# Patient Record
Sex: Female | Born: 1955
Health system: Southern US, Community
[De-identification: ages and names within clinical notes are randomized; demographics above are authoritative.]

## PROBLEM LIST (undated history)

## (undated) DIAGNOSIS — M109 Gout, unspecified: Secondary | ICD-10-CM

## (undated) DIAGNOSIS — E079 Disorder of thyroid, unspecified: Secondary | ICD-10-CM

## (undated) DIAGNOSIS — I4819 Other persistent atrial fibrillation: Secondary | ICD-10-CM

## (undated) DIAGNOSIS — M549 Dorsalgia, unspecified: Secondary | ICD-10-CM

## (undated) DIAGNOSIS — R55 Syncope and collapse: Secondary | ICD-10-CM

## (undated) DIAGNOSIS — I2699 Other pulmonary embolism without acute cor pulmonale: Secondary | ICD-10-CM

## (undated) DIAGNOSIS — R011 Cardiac murmur, unspecified: Secondary | ICD-10-CM

---

## 2014-11-15 ENCOUNTER — Encounter (HOSPITAL_COMMUNITY): Payer: Self-pay | Admitting: Neurology

## 2014-11-15 ENCOUNTER — Emergency Department (HOSPITAL_COMMUNITY)
Admission: EM | Admit: 2014-11-15 | Discharge: 2014-11-15 | Disposition: A | Discharge status: Home / Self Care | Payer: No Typology Code available for payment source | Attending: Emergency Medicine | Admitting: Emergency Medicine

## 2014-11-15 DIAGNOSIS — Y998 Other external cause status: Secondary | ICD-10-CM | POA: Insufficient documentation

## 2014-11-15 DIAGNOSIS — Y92480 Sidewalk as the place of occurrence of the external cause: Secondary | ICD-10-CM | POA: Diagnosis not present

## 2014-11-15 DIAGNOSIS — S0101XA Laceration without foreign body of scalp, initial encounter: Secondary | ICD-10-CM | POA: Diagnosis not present

## 2014-11-15 DIAGNOSIS — W01198A Fall on same level from slipping, tripping and stumbling with subsequent striking against other object, initial encounter: Secondary | ICD-10-CM | POA: Diagnosis not present

## 2014-11-15 DIAGNOSIS — Z86711 Personal history of pulmonary embolism: Secondary | ICD-10-CM | POA: Insufficient documentation

## 2014-11-15 DIAGNOSIS — Y9389 Activity, other specified: Secondary | ICD-10-CM | POA: Diagnosis not present

## 2014-11-15 DIAGNOSIS — W19XXXA Unspecified fall, initial encounter: Secondary | ICD-10-CM

## 2014-11-15 HISTORY — DX: Dorsalgia, unspecified: M54.9

## 2014-11-15 HISTORY — DX: Other pulmonary embolism without acute cor pulmonale: I26.99

## 2014-11-15 MED ORDER — LIDOCAINE-EPINEPHRINE 1 %-1:100000 IJ SOLN
20.0000 mL | Freq: Once | INTRAMUSCULAR | Status: AC
  Administered 2014-11-15: 20 mL via INTRADERMAL
  Filled 2014-11-15: qty 1

## 2014-11-15 NOTE — Discharge Instructions (Signed)
Staple Care and Removal Your caregiver has used staples today to repair your wound. Staples are used to help a wound heal faster by holding the edges of the wound together. The staples can be removed when the wound has healed well enough to stay together after the staples are removed. A dressing (wound covering), depending on the location of the wound, may have been applied. This may be changed once per day or as instructed. If the dressing sticks, it may be soaked off with soapy water or hydrogen peroxide. Only take over-the-counter or prescription medicines for pain, discomfort, or fever as directed by your caregiver.  If you did not receive a tetanus shot today because you did not recall when your last one was given, check with your caregiver when you have your staples removed to determine if one is needed. Return to your caregiver's office in 1 week or as suggested to have your staples removed. SEEK IMMEDIATE MEDICAL CARE IF:   You have redness, swelling, or increasing pain in the wound.  You have pus coming from the wound.  You have a fever.  You notice a bad smell coming from the wound or dressing.  Your wound edges break open after staples have been removed. Document Released: 12/20/2000 Document Revised: 06/19/2011 Document Reviewed: 01/04/2005 [facility] Patient Information 2015 [place], [place]. This information is not intended to replace advice given to you by your health care provider. Make sure you discuss any questions you have with your health care provider. Laceration Care, Adult A laceration is a cut or lesion that goes through all layers of the skin and into the tissue just beneath the skin. TREATMENT  Some lacerations may not require closure. Some lacerations may not be able to be closed due to an increased risk of infection. It is important to see your caregiver as soon as possible after an injury to minimize the risk of infection and maximize the opportunity for successful  closure. If closure is appropriate, pain medicines may be given, if needed. The wound will be cleaned to help prevent infection. Your caregiver will use stitches (sutures), staples, wound glue (adhesive), or skin adhesive strips to repair the laceration. These tools bring the skin edges together to allow for faster healing and a better cosmetic outcome. However, all wounds will heal with a scar. Once the wound has healed, scarring can be minimized by covering the wound with sunscreen during the day for 1 full year. HOME CARE INSTRUCTIONS  For sutures or staples:  Keep the wound clean and dry.  If you were given a bandage (dressing), you should change it at least once a day. Also, change the dressing if it becomes wet or dirty, or as directed by your caregiver.  Wash the wound with soap and water 2 times a day. Rinse the wound off with water to remove all soap. Pat the wound dry with a clean towel.  After cleaning, apply a thin layer of the antibiotic ointment as recommended by your caregiver. This will help prevent infection and keep the dressing from sticking.  You may shower as usual after the first 24 hours. Do not soak the wound in water until the sutures are removed.  Only take over-the-counter or prescription medicines for pain, discomfort, or fever as directed by your caregiver.  Get your sutures or staples removed as directed by your caregiver. For skin adhesive strips:  Keep the wound clean and dry.  Do not get the skin adhesive strips wet. You may bathe  carefully, using caution to keep the wound dry.  If the wound gets wet, pat it dry with a clean towel.  Skin adhesive strips will fall off on their own. You may trim the strips as the wound heals. Do not remove skin adhesive strips that are still stuck to the wound. They will fall off in time. For wound adhesive:  You may briefly wet your wound in the shower or bath. Do not soak or scrub the wound. Do not swim. Avoid periods of  heavy perspiration until the skin adhesive has fallen off on its own. After showering or bathing, gently pat the wound dry with a clean towel.  Do not apply liquid medicine, cream medicine, or ointment medicine to your wound while the skin adhesive is in place. This may loosen the film before your wound is healed.  If a dressing is placed over the wound, be careful not to apply tape directly over the skin adhesive. This may cause the adhesive to be pulled off before the wound is healed.  Avoid prolonged exposure to sunlight or tanning lamps while the skin adhesive is in place. Exposure to ultraviolet light in the first year will darken the scar.  The skin adhesive will usually remain in place for 5 to 10 days, then naturally fall off the skin. Do not pick at the adhesive film. You may need a tetanus shot if:  You cannot remember when you had your last tetanus shot.  You have never had a tetanus shot. If you get a tetanus shot, your arm may swell, get red, and feel warm to the touch. This is common and not a problem. If you need a tetanus shot and you choose not to have one, there is a rare chance of getting tetanus. Sickness from tetanus can be serious. SEEK MEDICAL CARE IF:   You have redness, swelling, or increasing pain in the wound.  You see a red line that goes away from the wound.  You have yellowish-white fluid (pus) coming from the wound.  You have a fever.  You notice a bad smell coming from the wound or dressing.  Your wound breaks open before or after sutures have been removed.  You notice something coming out of the wound such as wood or glass.  Your wound is on your hand or foot and you cannot move a finger or toe. SEEK IMMEDIATE MEDICAL CARE IF:   Your pain is not controlled with prescribed medicine.  You have severe swelling around the wound causing pain and numbness or a change in color in your arm, hand, leg, or foot.  Your wound splits open and starts  bleeding.  You have worsening numbness, weakness, or loss of function of any joint around or beyond the wound.  You develop painful lumps near the wound or on the skin anywhere on your body. MAKE SURE YOU:   Understand these instructions.  Will watch your condition.  Will get help right away if you are not doing well or get worse. Document Released: 03/27/2005 Document Revised: 06/19/2011 Document Reviewed: 09/20/2010 [facility] Patient Information 2015 [place], [place]. This information is not intended to replace advice given to you by your health care provider. Make sure you discuss any questions you have with your health care provider.

## 2014-11-15 NOTE — ED Notes (Signed)
Pt requested to sit in chair. Pt removed herself from continuous pulse ox and BP. Assisted pt to chair, given water basin and wash cloths as well as baby shampoo so pt could clean blood out of her hair. Pt is content and cleaning out hair.

## 2014-11-15 NOTE — ED Provider Notes (Signed)
CSN: [id number]     Arrival date & time 11/15/14  0844 History   First MD Initiated Contact with Patient 11/15/14 [phone]     Chief Complaint  Patient presents with  . Laceration     (Consider location/radiation/quality/duration/timing/severity/associated sxs/prior Treatment) Patient is a 59 y.o. female presenting with scalp laceration. The history is provided by the patient.  Head Laceration This is a new problem. The current episode started less than 1 hour ago. The problem occurs constantly. The problem has not changed since onset.Nothing aggravates the symptoms. Nothing relieves the symptoms. She has tried nothing for the symptoms.    Past Medical History  Diagnosis Date  . Back pain   . PE (pulmonary embolism)    History reviewed. No pertinent past surgical history. No family history on file. History  Substance Use Topics  . Smoking status: Never Smoker   . Smokeless tobacco: Not on file  . Alcohol Use: No   OB History    No data available     Review of Systems  Skin: Positive for wound.  All other systems reviewed and are negative.     Allergies  Codeine  Home Medications   Prior to Admission medications   Not on File   BP 143/79 mmHg  Pulse 93  Temp(Src) 99.7 F (37.6 C) (Oral)  Resp 18  SpO2 95% Physical Exam  Constitutional: She is oriented to person, place, and time. She appears well-developed and well-nourished. No distress.  HENT:  Head: Normocephalic.  Eyes: Conjunctivae are normal.  Neck: Neck supple. No tracheal deviation present.  Cardiovascular: Normal rate and regular rhythm.   Pulmonary/Chest: Effort normal. No respiratory distress.  Abdominal: Soft. She exhibits no distension.  Neurological: She is alert and oriented to person, place, and time. No cranial nerve deficit. Coordination normal. GCS eye subscore is 4. GCS verbal subscore is 5. GCS motor subscore is 6.  Skin: Skin is warm and dry.  Psychiatric: She has a normal mood and  affect. Her speech is normal.    ED Course  Procedures (including critical care time)  LACERATION REPAIR Performed by: Leo Grosser Authorized by: Leo Grosser Consent: Verbal consent obtained. Risks and benefits: risks, benefits and alternatives were discussed Consent given by: patient Patient identity confirmed: provided demographic data Prepped and Draped in normal sterile fashion Wound explored  Laceration Location: scalp  Laceration Length: 3cm  No Foreign Bodies seen or palpated  Anesthesia: local infiltration  Local anesthetic: lidocaine 2% w epinephrine  Anesthetic total: 10 ml  Irrigation method: syringe Amount of cleaning: standard  Skin closure: staples  Number of sutures: 5  Technique: simple  Patient tolerance: Patient tolerated the procedure well with no immediate complications.   Labs Review Labs Reviewed - No data to display  Imaging Review No results found.   EKG Interpretation None      MDM   Final diagnoses:  Scalp laceration, initial encounter  Fall from standing, initial encounter   59 year old female tripped and fell up a narrow striking her head on a rail sustaining a laceration and an underlying hematoma. Considering pt's GCS of 15, lack of evidence of skull base fracture, and lack of vomiting or altered mental status, I do not believe that further imaging is warranted in this pt.  Pt has no neurologic deficit.  Laceration was thoroughly irrigated and repaired without complication.  Pt is to follow up with healthcare provider in ten days for removal of staples.  Proper wound management counseling was administered.  Patient with low back pain, ambulating without difficulty, no bowel or bladder incontinence, no outward signs of injury or significant mechanism, recommended NSAIDs and supportive care, return precautions discussed.   Leo Grosser, MD 11/15/14 1030

## 2014-11-15 NOTE — ED Notes (Signed)
Per ems- Pt was at [employer] and tripped outside on the sidewalk hitting her head on the outdoor seating railing. No LOC. Has laceration to left side of head estimated about 3 inches x 1 inch. Denies blood thinners. 95% RA, BP 144/82, HR 90.

## 2014-11-15 NOTE — ED Notes (Signed)
Pt taken out in wheelchair, plan for patient to call cab to take her back to her car at [facility]. Pt has all belongings with her. Scrub top given.

## 2014-11-15 NOTE — ED Notes (Signed)
Pt provided ice pack and warm wash clothes. Pt did not want PA to repair head laceration, requesting MD.

## 2014-11-15 NOTE — ED Provider Notes (Signed)
  59 year old female here with head laceration after a fall at [employer].  She denies current headache, dizziness, lightheadedness, or visual disturbance.  She does have some back pain which she feels is exacerbation of her chronic pain.  I undressed wounds, large scalp hematoma noted to left parietal scalp with approx 3cm laceration noted.  I started to clean wounds and patient now informs that "where i come from PA's dont see head injuries."  I attempted to explain PA's role in the ED here in [place].  She is insisting to see MD only and would not let me finish exam, assessment, or procedure.  Attending physician, Dr. Laneta Simmers informed.  Larene Pickett, PA-C 11/15/14 4315  Leo Grosser, MD 11/15/14 [phone]

## 2014-11-15 NOTE — ED Notes (Signed)
MD at bedside to staple pt head laceration.

## 2014-11-25 ENCOUNTER — Encounter (HOSPITAL_COMMUNITY): Payer: Self-pay | Admitting: *Deleted

## 2014-11-25 ENCOUNTER — Emergency Department (HOSPITAL_COMMUNITY)
Admission: EM | Admit: 2014-11-25 | Discharge: 2014-11-25 | Disposition: A | Discharge status: Home / Self Care | Payer: No Typology Code available for payment source | Attending: Emergency Medicine | Admitting: Emergency Medicine

## 2014-11-25 DIAGNOSIS — Z86711 Personal history of pulmonary embolism: Secondary | ICD-10-CM | POA: Diagnosis not present

## 2014-11-25 DIAGNOSIS — Z4802 Encounter for removal of sutures: Secondary | ICD-10-CM | POA: Diagnosis not present

## 2014-11-25 NOTE — ED Provider Notes (Signed)
CSN: [id number]     Arrival date & time 11/25/14  0211 History   First MD Initiated Contact with Patient 11/25/14 [phone]     Chief Complaint  Patient presents with  . Suture / Staple Removal     (Consider location/radiation/quality/duration/timing/severity/associated sxs/prior Treatment) HPI Comments: 59 yo female presenting for suture removal.  She had 5 staples placed 10 days ago at [facility] after falling and cutting her head.  Pain is improved since that time and now is minimal. No purulent drainage, fevers, or swelling.     Past Medical History  Diagnosis Date  . Back pain   . PE (pulmonary embolism)    No past surgical history on file. No family history on file. Social History  Substance Use Topics  . Smoking status: Never Smoker   . Smokeless tobacco: Not on file  . Alcohol Use: No   OB History    No data available     Review of Systems  All other systems reviewed and are negative.     Allergies  Codeine  Home Medications   Prior to Admission medications   Not on File   There were no vitals taken for this visit. Physical Exam  Constitutional: She is oriented to person, place, and time. She appears well-developed and well-nourished. No distress.  HENT:  Head: Normocephalic and atraumatic.  Eyes: Conjunctivae are normal. No scleral icterus.  Neck: Neck supple.  Cardiovascular: Normal rate and intact distal pulses.   Pulmonary/Chest: Effort normal. No stridor. No respiratory distress.  Abdominal: Normal appearance. She exhibits no distension.  Neurological: She is alert and oriented to person, place, and time.  Skin: Skin is warm and dry. No rash noted.  Healing scalp laceration with 5 staples in place.   Psychiatric: She has a normal mood and affect. Her behavior is normal.  Nursing note and vitals reviewed.   ED Course  SUTURE REMOVAL Date/Time: 11/25/2014 9:07 AM Performed by: Serita Grit Authorized by: Serita Grit Consent: Verbal consent  obtained. Risks and benefits: risks, benefits and alternatives were discussed Body area: head/neck Location details: scalp Wound Appearance: clean Staples Removed: 5 Patient tolerance: Patient tolerated the procedure well with no immediate complications   (including critical care time) Labs Review Labs Reviewed - No data to display  Imaging Review No results found. I have personally reviewed and evaluated these images and lab results as part of my medical decision-making.   EKG Interpretation None      MDM   Final diagnoses:  Visit for suture removal    Staples removed without difficulty .     Serita Grit, MD 11/25/14 [phone]

## 2014-11-25 NOTE — Discharge Instructions (Signed)

## 2014-11-25 NOTE — ED Notes (Signed)
Patient fell @ 10 days ago and has staples to scalp laceration.  Patient is seeking staple removal.  Minimal edema and redness noted to soft tissue around staples.  No drainage noted.  Patient denies pain.

## 2018-04-22 ENCOUNTER — Other Ambulatory Visit (HOSPITAL_COMMUNITY): Payer: Self-pay | Admitting: Family Medicine

## 2018-04-22 DIAGNOSIS — R011 Cardiac murmur, unspecified: Secondary | ICD-10-CM

## 2018-04-23 ENCOUNTER — Other Ambulatory Visit: Payer: Self-pay | Admitting: Family Medicine

## 2018-04-24 ENCOUNTER — Other Ambulatory Visit: Payer: Self-pay | Admitting: Family Medicine

## 2018-04-24 DIAGNOSIS — Z13820 Encounter for screening for osteoporosis: Secondary | ICD-10-CM

## 2018-04-24 DIAGNOSIS — K760 Fatty (change of) liver, not elsewhere classified: Secondary | ICD-10-CM

## 2018-04-29 ENCOUNTER — Ambulatory Visit
Admission: RE | Admit: 2018-04-29 | Discharge: 2018-04-29 | Disposition: A | Discharge status: Home / Self Care | Payer: No Typology Code available for payment source | Source: Ambulatory Visit | Attending: Family Medicine | Admitting: Family Medicine

## 2018-04-29 DIAGNOSIS — K760 Fatty (change of) liver, not elsewhere classified: Secondary | ICD-10-CM

## 2018-05-02 ENCOUNTER — Ambulatory Visit (HOSPITAL_COMMUNITY)
Admission: RE | Admit: 2018-05-02 | Discharge: 2018-05-02 | Disposition: A | Discharge status: Home / Self Care | Payer: Medicare Other | Source: Ambulatory Visit | Attending: Family Medicine | Admitting: Family Medicine

## 2018-05-02 DIAGNOSIS — I050 Rheumatic mitral stenosis: Secondary | ICD-10-CM | POA: Diagnosis not present

## 2018-05-02 DIAGNOSIS — R011 Cardiac murmur, unspecified: Secondary | ICD-10-CM

## 2018-05-02 NOTE — Progress Notes (Signed)
  Echocardiogram 2D Echocardiogram has been performed.  Cynthia Cook 05/02/2018, 2:58 PM

## 2018-05-21 ENCOUNTER — Ambulatory Visit: Payer: Medicare Other | Admitting: Cardiology

## 2018-05-21 ENCOUNTER — Ambulatory Visit: Payer: Medicare Other | Admitting: Internal Medicine

## 2018-05-21 VITALS — BP 118/76 | HR 82 | Ht 67.0 in | Wt 264.0 lb

## 2018-05-21 DIAGNOSIS — R011 Cardiac murmur, unspecified: Secondary | ICD-10-CM | POA: Diagnosis not present

## 2018-05-21 DIAGNOSIS — Q231 Congenital insufficiency of aortic valve: Secondary | ICD-10-CM | POA: Diagnosis not present

## 2018-05-21 DIAGNOSIS — R0600 Dyspnea, unspecified: Secondary | ICD-10-CM

## 2018-05-21 DIAGNOSIS — R079 Chest pain, unspecified: Secondary | ICD-10-CM

## 2018-05-21 DIAGNOSIS — Z6841 Body Mass Index (BMI) 40.0 and over, adult: Secondary | ICD-10-CM

## 2018-05-21 DIAGNOSIS — R0609 Other forms of dyspnea: Secondary | ICD-10-CM

## 2018-05-21 MED ORDER — METOPROLOL TARTRATE 50 MG PO TABS: ORAL_TABLET | ORAL | 0 refills | Status: DC

## 2018-05-21 NOTE — Patient Instructions (Signed)
Medication Instructions:  Your physician recommends that you continue on your current medications as directed. Please refer to the Current Medication list given to you today.  If you need a refill on your cardiac medications before your next appointment, please call your pharmacy.   Lab work: Your physician recommends that you return for lab work  1-2 days before your Coronary CT  If you have labs (blood work) drawn today and your tests are completely normal, you will receive your results only by: Marland Kitchen MyChart Message (if you have MyChart) OR . A paper copy in the mail If you have any lab test that is abnormal or we need to change your treatment, we will call you to review the results.  Testing/Procedures: Your physician has requested that you have cardiac CT. Cardiac computed tomography (CT) is a painless test that uses an x-ray machine to take clear, detailed pictures of your heart. For further information please visit [email or web address]. Please follow instruction sheet as given.     Follow-Up: At [facility], you and your health needs are our priority.  As part of our continuing mission to provide you with exceptional heart care, we have created designated Provider Care Teams.  These Care Teams include your primary Cardiologist (physician) and Advanced Practice Providers (APPs -  Physician Assistants and Nurse Practitioners) who all work together to provide you with the care you need, when you need it. You will need a follow up appointment in 12 months.  Please call our office 2 months in advance to schedule this appointment.  You may see  Dr.Acharya or one of the following Advanced Practice Providers on your designated Care Team:   Rosaria Ferries, PA-C . Jory Sims, DNP, ANP  Any Other Special Instructions Will Be Listed Below (If Applicable).  Please arrive at the [facility] main entrance of [facility] at TBD AM (30-45 minutes prior to test start time)  [facility] [place], [place] [address] [phone]  Proceed to the [facility] Radiology Department (First Floor).  Please follow these instructions carefully (unless otherwise directed):   On the Night Before the Test: . Be sure to Drink plenty of water. . Do not consume any caffeinated/decaffeinated beverages or chocolate 12 hours prior to your test. . Do not take any antihistamines 12 hours prior to your test.  On the Day of the Test: . Drink plenty of water. Do not drink any water within one hour of the test. . Do not eat any food 4 hours prior to the test. . You may take your regular medications prior to the test.  . Take metoprolol (Lopressor) two hours prior to test. An Rx has been sent to your pharmacy. .      After the Test: . Drink plenty of water. . After receiving IV contrast, you may experience a mild flushed feeling. This is normal. . On occasion, you may experience a mild rash up to 24 hours after the test. This is not dangerous. If this occurs, you can take Benadryl 25 mg and increase your fluid intake. . If you experience trouble breathing, this can be serious. If it is severe call 911 IMMEDIATELY. If it is mild, please call our office. . If you take any of these medications: Glipizide/Metformin, Avandament, Glucavance, please do not take 48 hours after completing test.

## 2018-05-21 NOTE — Progress Notes (Signed)
Cardiology Office Note:    Date:  05/21/2018   ID:  Cynthia Cook, DOB [birth year], MRN [id number]  PCP:  Shon Baton, MD  Cardiologist:  No primary care provider on file.  Electrophysiologist:  None   Referring MD: Einar Pheasant, DO   Abnormal echocardiogram  History of Present Illness:    Cynthia Cook is a 63 y.o. female with a hx of significant psychological stressors, anxiety and depression, and PTSD who presents today to establish care with [facility] for cardiovascular review in the setting of an abnormal echocardiogram.  She has not seen a physician in many years and chooses to treat her medical conditions with supplements, taken from patient provided list including: "Vitamin C, D3, K2, E, Zinc, Magnesium, alpha lipoic acid, milk thistle, nattokinase, stinging nettle, CoQ10, multivitamin, sea kelp (iodine), potassium, selenium, and turmeric/bioperine for inflammation." I have cautioned the patient on the use of supplements that are not regulated by the FDA. 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  She shares with her joy in obtaining medicare so that she could visit physicians. She had an echocardiogram performed on 05/02/2018 ordered by her previous primary care physician Dr. Doreatha Lew for a murmur. This demonstrated a preserved ejection fraction, grade 2 diastolic dysfunction with an E/e' of 30 consistent with elevated LV filling pressure, possible bicuspid aortic valve with very mild aortic valve stenosis, MAC with mean diastolic gradient 4 mmHg, moderately reduced systolic function of the RV, normal aortic root size.   She tells me that she gets short of breath with ambulation and has mild chest discomfort provoked by exertion. She has no known family history of bicuspid aortic valve.   She is quite troubled by the fact that she has  mitral annular calcification and seeks recommendations for herbal supplements that will dissolve the calcifications. We have thoroughly discussed the natural history of both aortic and mitral valve disease, and medical and surgical options for left sided valve disease. She is not interested in beta blockade to mitigate the HR effects on MAC mitral stenosis, and she is not interested in TEE for investigation of morphology of aortic valve due to concerns about procedures and effects of sedation medications on her body system.   She denies significant palpitations, PND, orthopnea. She endorses lower extremity swelling. Denies dizziness, lightheadedness, and syncope. Denies snoring.   Past Medical History:  Diagnosis Date  . Back pain   . PE (pulmonary embolism)     No past surgical history on file.  Current Medications: Current Meds  Medication Sig  . Alpha-Lipoic Acid 200 MG CAPS Take by mouth.  . Cholecalciferol (VITAMIN D3) 1.25 MG (50000 UT) CAPS Take by mouth.  . Coenzyme Q10 (COQ10) 200 MG CAPS Take by mouth.  . Magnesium 200 MG TABS Take by mouth.  . Menaquinone-7 (VITAMIN K2) 100 MCG CAPS Take by mouth.  Marland Kitchen MILK THISTLE PO Take by mouth.  . Multiple Vitamins-Minerals (MULTIVITAMIN WITH MINERALS) tablet Take 1 tablet by mouth daily.  Marland Kitchen NATTOKINASE PO Take by mouth.  . Selenium 100 MCG CAPS Take by mouth.  . TURMERIC PO Take by mouth.  . vitamin C (ASCORBIC ACID) 250 MG tablet Take 250 mg by mouth daily.  . vitamin E 400 UNIT capsule Take 400 Units by mouth daily.  . Zinc 30 MG TABS Take by mouth.     Allergies:  Codeine   Social History   Socioeconomic History  . Marital status: Divorced    Spouse name: Not on file  . Number of children: Not on file  . Years of education: Not on file  . Highest education level: Not on file  Occupational History  . Not on file  Social Needs  . Financial resource strain: Not on file  . Food insecurity:    Worry: Not on file     Inability: Not on file  . Transportation needs:    Medical: Not on file    Non-medical: Not on file  Tobacco Use  . Smoking status: Never Smoker  Substance and Sexual Activity  . Alcohol use: No  . Drug use: Not on file  . Sexual activity: Not on file  Lifestyle  . Physical activity:    Days per week: Not on file    Minutes per session: Not on file  . Stress: Not on file  Relationships  . Social connections:    Talks on phone: Not on file    Gets together: Not on file    Attends religious service: Not on file    Active member of club or organization: Not on file    Attends meetings of clubs or organizations: Not on file    Relationship status: Not on file  Other Topics Concern  . Not on file  Social History Narrative  . Not on file     Family History: The patient denies a significant family history of early MI or SCD.   ROS:   Please see the history of present illness.    All other systems reviewed and are negative.  EKGs/Labs/Other Studies Reviewed:    The following studies were reviewed today:  EKG:  NSR, rate 84 bpm.  Recent Labs: No results found for requested labs within last 8760 hours.  Recent Lipid Panel No results found for: CHOL, TRIG, HDL, CHOLHDL, VLDL, LDLCALC, LDLDIRECT  Physical Exam:    VS:  BP 118/76   Pulse 82   Ht _0  (1.702 m)   Wt 264 lb (119.7 kg)   BMI 41.35 kg/m     Wt Readings from Last 3 Encounters:  05/21/18 264 lb (119.7 kg)     Constitutional: No acute distress Cardiovascular: regular rhythm, normal rate, 2/6 SEM, unable appreciate a diastolic murmur due to body habitus. S1 and S2 normal. No systolic click appreciated. Radial pulses normal bilaterally. No jugular venous distention.  Respiratory: clear to auscultation bilaterally GI : normal bowel sounds, soft and nontender. No distention.   MSK: extremities warm, well perfused. Trace bilateral edema.  NEURO: grossly nonfocal exam, moves all extremities. PSYCH: alert and  oriented x 3, normal mood and affect.   ASSESSMENT:    1. Chest pain, unspecified type   2. Murmur, cardiac   3. Dyspnea on exertion   4. Possible bicuspid aortic valve   5. Class 3 severe obesity with body mass index (BMI) of 40.0 to 44.9 in adult, unspecified obesity type, unspecified whether serious comorbidity present [facility])    PLAN:    We had a thorough and extended conversation about cardiac risk factors such as hyperlipidemia and obesity in the setting of chest pain and DOE and the recommended workup. She has a normal ECG and ideally we could perform a stress treadmill test however the patient cannot exercise. I have offered a pharmacologic stress test, and the patient states "I would never let anyone inject that into my body."  I have offered the patient a CT coronary angiogram with CT FFR to assess coronary artery disease, and since it is a gated study, we could also look at the aortic valve morphology if performed with retrospective gating. We have discussed lifetime radiation and radiation exposure with nuclear stress vs gated CT angiography. We have discussed that metoprolol might be required and nitroglycerin is required for an optimal study. She understands the risks, benefits and alternatives (no testing) and chooses to proceed with CT coronary angiography with retrospective gating. We will arrange this study for her. I have asked her to contact our office for more urgent testing if her symptoms progress.   I have suggested initiating a statin due to elevated lipids. She declines statin and feels she is already on supplements treating her cholesterol. I have described for her the most current knowledge from the ACC/AHA and lipid guidelines. She understands and declines statin.   I believe it would be best if we could define the morphology of the aortic valve. She has declined TEE so I am hopeful we will be able to acquire this with CTA.   TIME SPENT WITH PATIENT: 60 minutes of direct  patient care. More than 50% of that time was spent on coordination of care and counseling regarding primary and secondary prevention of heart disease, investigation of chest pain and SOB, and medication recommendations.  Cherlynn Kaiser, MD [place]  [facility]    Medication Adjustments/Labs and Tests Ordered: Current medicines are reviewed at length with the patient today.  Concerns regarding medicines are outlined above.  Orders Placed This Encounter  Procedures  . CT CORONARY MORPH W/CTA COR W/SCORE W/CA W/CM &/OR WO/CM  . CT CORONARY FRACTIONAL FLOW RESERVE DATA PREP  . CT CORONARY FRACTIONAL FLOW RESERVE FLUID ANALYSIS  . Basic metabolic panel  . EKG 12-Lead   Meds ordered this encounter  Medications  . metoprolol tartrate (LOPRESSOR) 50 MG tablet    Sig: Take 1 tablet (71m) 2 hours prior to your Coronary CTA    Dispense:  1 tablet    Refill:  0    Patient Instructions  Medication Instructions:  Your physician recommends that you continue on your current medications as directed. Please refer to the Current Medication list given to you today.  If you need a refill on your cardiac medications before your next appointment, please call your pharmacy.   Lab work: Your physician recommends that you return for lab work  1-2 days before your Coronary CT  If you have labs (blood work) drawn today and your tests are completely normal, you will receive your results only by: .Marland KitchenMyChart Message (if you have MyChart) OR . A paper copy in the mail If you have any lab test that is abnormal or we need to change your treatment, we will call you to review the results.  Testing/Procedures: Your physician has requested that you have cardiac CT. Cardiac computed tomography (CT) is a painless test that uses an x-ray machine to take clear, detailed pictures of your heart. For further information please visit [email or web address] Please follow instruction sheet as  given.     Follow-Up: [facility] our [facility].  As part of our continuing mission to provide you with exceptional heart care, we have created designated Provider Care Teams.  These Care Teams include your primary Cardiologist (physician) and Advanced Practice Providers (APPs -  Physician Assistants and Nurse Practitioners) who all work together to provide  you with the care you need, when you need it. You will need a follow up appointment in 12 months.  Please call our office 2 months in advance to schedule this appointment.  You may see  Dr.Pacen Watford or one of the following Advanced Practice Providers on your designated Care Team:   Rosaria Ferries, PA-C . Jory Sims, DNP, ANP  Any Other Special Instructions Will Be Listed Below (If Applicable).  Please arrive at the [facility] main entrance of [facility] at TBD AM (30-45 minutes prior to test start time)  [facility] [place], [place] [address] [phone]  Proceed to the [facility] Radiology Department (First Floor).  Please follow these instructions carefully (unless otherwise directed):   On the Night Before the Test: . Be sure to Drink plenty of water. . Do not consume any caffeinated/decaffeinated beverages or chocolate 12 hours prior to your test. . Do not take any antihistamines 12 hours prior to your test.  On the Day of the Test: . Drink plenty of water. Do not drink any water within one hour of the test. . Do not eat any food 4 hours prior to the test. . You may take your regular medications prior to the test.  . Take metoprolol (Lopressor) two hours prior to test. An Rx has been sent to your pharmacy. .      After the Test: . Drink plenty of water. . After receiving IV contrast, you may experience a mild flushed feeling. This is normal. . On occasion, you may experience a mild rash up to 24 hours after the test. This is not dangerous.  If this occurs, you can take Benadryl 25 mg and increase your fluid intake. . If you experience trouble breathing, this can be serious. If it is severe call 911 IMMEDIATELY. If it is mild, please call our office. . If you take any of these medications: Glipizide/Metformin, Avandament, Glucavance, please do not take 48 hours after completing test.     Signed, Elouise Munroe, MD  05/21/2018 2:08 PM    [place]

## 2018-06-11 ENCOUNTER — Telehealth: Payer: Self-pay

## 2018-06-11 NOTE — Telephone Encounter (Signed)
Message received from Smith Robert., [facility] @ [address]  Mack Guise B   06-11-18 Patient called and cancel the CT - due to she can't afford the test.         Update-FYI- fwd to Dr.Acharya

## 2018-06-17 ENCOUNTER — Ambulatory Visit (HOSPITAL_COMMUNITY): Payer: Medicare Other

## 2018-06-19 ENCOUNTER — Other Ambulatory Visit: Payer: No Typology Code available for payment source

## 2018-06-25 ENCOUNTER — Telehealth: Payer: Self-pay

## 2018-06-25 DIAGNOSIS — R0789 Other chest pain: Secondary | ICD-10-CM
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
## 2018-07-02 ENCOUNTER — Encounter: Payer: Self-pay | Admitting: Internal Medicine

## 2018-07-09 ENCOUNTER — Telehealth: Payer: Self-pay

## 2018-07-09 NOTE — Telephone Encounter (Signed)
Sent message to patient - just call back  To reschedule once this situation with covid virus  Is finished will in form Dr Margaretann Loveless.

## 2018-07-09 NOTE — Telephone Encounter (Signed)
Received Mychart message:  This message is for [name]. I cancelled the nuclear medicine test because of the ever growing threat of Covid. I am diligently avoiding public spaces and feel I'd be at unnecessary risk to have this appointment. I've started to lose weight and am steadily feeling a little better. I hope all of you are well. Thank you.  [name]

## 2018-07-24 ENCOUNTER — Ambulatory Visit (HOSPITAL_COMMUNITY): Payer: Medicare Other

## 2018-07-25 ENCOUNTER — Ambulatory Visit (HOSPITAL_COMMUNITY): Payer: Medicare Other

## 2018-09-26 ENCOUNTER — Emergency Department (HOSPITAL_COMMUNITY): Payer: Medicare Other

## 2018-09-26 ENCOUNTER — Encounter (HOSPITAL_COMMUNITY): Payer: Self-pay | Admitting: Emergency Medicine

## 2018-09-26 ENCOUNTER — Emergency Department (HOSPITAL_COMMUNITY)
Admission: EM | Admit: 2018-09-26 | Discharge: 2018-09-26 | Disposition: A | Discharge status: Home / Self Care | Payer: Medicare Other | Attending: Emergency Medicine | Admitting: Emergency Medicine

## 2018-09-26 DIAGNOSIS — M25542 Pain in joints of left hand: Secondary | ICD-10-CM | POA: Diagnosis not present

## 2018-09-26 DIAGNOSIS — Z20828 Contact with and (suspected) exposure to other viral communicable diseases: Secondary | ICD-10-CM | POA: Insufficient documentation

## 2018-09-26 DIAGNOSIS — M25562 Pain in left knee: Secondary | ICD-10-CM | POA: Diagnosis not present

## 2018-09-26 DIAGNOSIS — M2550 Pain in unspecified joint: Secondary | ICD-10-CM

## 2018-09-26 DIAGNOSIS — M25531 Pain in right wrist: Secondary | ICD-10-CM | POA: Diagnosis not present

## 2018-09-26 DIAGNOSIS — M25572 Pain in left ankle and joints of left foot: Secondary | ICD-10-CM | POA: Insufficient documentation

## 2018-09-26 DIAGNOSIS — M25541 Pain in joints of right hand: Secondary | ICD-10-CM | POA: Diagnosis not present

## 2018-09-26 DIAGNOSIS — R600 Localized edema: Secondary | ICD-10-CM | POA: Diagnosis not present

## 2018-09-26 DIAGNOSIS — M25532 Pain in left wrist: Secondary | ICD-10-CM | POA: Diagnosis not present

## 2018-09-26 DIAGNOSIS — Z79899 Other long term (current) drug therapy: Secondary | ICD-10-CM | POA: Insufficient documentation

## 2018-09-26 DIAGNOSIS — M25571 Pain in right ankle and joints of right foot: Secondary | ICD-10-CM | POA: Insufficient documentation

## 2018-09-26 HISTORY — DX: Cardiac murmur, unspecified: R01.1

## 2018-09-26 HISTORY — DX: Disorder of thyroid, unspecified: E07.9

## 2018-09-26 LAB — COMPREHENSIVE METABOLIC PANEL
ALT: 10 U/L (ref 0–44)
AST: 15 U/L (ref 15–41)
Albumin: 4.1 g/dL (ref 3.5–5.0)
Alkaline Phosphatase: 67 U/L (ref 38–126)
Anion gap: 15 (ref 5–15)
BUN: 25 mg/dL — ABNORMAL HIGH (ref 8–23)
CO2: 27 mmol/L (ref 22–32)
Calcium: 9.5 mg/dL (ref 8.9–10.3)
Chloride: 98 mmol/L (ref 98–111)
Creatinine, Ser: 1.27 mg/dL — ABNORMAL HIGH (ref 0.44–1.00)
GFR calc Af Amer: 51 mL/min — ABNORMAL LOW (ref >60)
GFR calc non Af Amer: 44 mL/min — ABNORMAL LOW (ref >60)
Glucose, Bld: 110 mg/dL — ABNORMAL HIGH (ref 70–99)
Potassium: 4.1 mmol/L (ref 3.5–5.1)
Sodium: 140 mmol/L (ref 135–145)
Total Bilirubin: 2.1 mg/dL — ABNORMAL HIGH (ref 0.3–1.2)
Total Protein: 8.3 g/dL — ABNORMAL HIGH (ref 6.5–8.1)

## 2018-09-26 LAB — CBC WITH DIFFERENTIAL/PLATELET
Abs Immature Granulocytes: 0.03 10*3/uL (ref 0.00–0.07)
Basophils Absolute: 0 10*3/uL (ref 0.0–0.1)
Basophils Relative: 0 %
Eosinophils Absolute: 0.1 10*3/uL (ref 0.0–0.5)
Eosinophils Relative: 1 %
HCT: 42.2 % (ref 36.0–46.0)
Hemoglobin: 13.6 g/dL (ref 12.0–15.0)
Immature Granulocytes: 0 %
Lymphocytes Relative: 11 %
Lymphs Abs: 1.1 10*3/uL (ref 0.7–4.0)
MCH: 28.3 pg (ref 26.0–34.0)
MCHC: 32.2 g/dL (ref 30.0–36.0)
MCV: 87.7 fL (ref 80.0–100.0)
Monocytes Absolute: 0.7 10*3/uL (ref 0.1–1.0)
Monocytes Relative: 7 %
Neutro Abs: 8.3 10*3/uL — ABNORMAL HIGH (ref 1.7–7.7)
Neutrophils Relative %: 81 %
Platelets: 165 10*3/uL (ref 150–400)
RBC: 4.81 MIL/uL (ref 3.87–5.11)
RDW: 14.3 % (ref 11.5–15.5)
WBC: 10.2 10*3/uL (ref 4.0–10.5)
nRBC: 0 % (ref 0.0–0.2)

## 2018-09-26 LAB — SEDIMENTATION RATE: Sed Rate: 30 mm/hr — ABNORMAL HIGH (ref 0–22)

## 2018-09-26 LAB — C-REACTIVE PROTEIN: CRP: 11.7 mg/dL — ABNORMAL HIGH (ref <1.0)

## 2018-09-26 LAB — URIC ACID: Uric Acid, Serum: 12.1 mg/dL — ABNORMAL HIGH (ref 2.5–7.1)

## 2018-09-26 LAB — CK: Total CK: 31 U/L — ABNORMAL LOW (ref 38–234)

## 2018-09-26 LAB — SARS CORONAVIRUS 2 BY RT PCR (HOSPITAL ORDER, PERFORMED IN ~~LOC~~ HOSPITAL LAB): SARS Coronavirus 2: NEGATIVE

## 2018-09-26 MED ORDER — KETOROLAC TROMETHAMINE 30 MG/ML IJ SOLN
15.0000 mg | Freq: Once | INTRAMUSCULAR | Status: AC
  Administered 2018-09-26: 15 mg via INTRAVENOUS
  Filled 2018-09-26: qty 1

## 2018-09-26 MED ORDER — DEXAMETHASONE SODIUM PHOSPHATE 10 MG/ML IJ SOLN
10.0000 mg | Freq: Once | INTRAMUSCULAR | Status: AC
  Administered 2018-09-26: 10 mg via INTRAVENOUS
  Filled 2018-09-26: qty 1

## 2018-09-26 NOTE — ED Notes (Signed)
Patient removed IV, walked to door and notified secretary that she was going to take cab home
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
## 2018-09-26 NOTE — ED Notes (Signed)
Patient transported to x-ray. ?

## 2018-09-26 NOTE — ED Notes (Signed)
Bed: WTR9 Expected date:  Expected time:  Means of arrival:  Comments: 

## 2018-09-26 NOTE — ED Notes (Signed)
Patient offered water/ juice and snack

## 2018-09-26 NOTE — ED Triage Notes (Signed)
Per EMS-coming from home, states she woke up this am with joint pain all over-states never had this kind of pain before-

## 2018-09-26 NOTE — ED Notes (Signed)
Patient aware we need urine, patient states she cannot urinate at this time. Patient also states she has not had anything to eat or drink since about midnight. RN aware

## 2018-12-19 ENCOUNTER — Encounter (INDEPENDENT_AMBULATORY_CARE_PROVIDER_SITE_OTHER): Payer: Medicare Other | Admitting: Ophthalmology

## 2018-12-25 ENCOUNTER — Other Ambulatory Visit: Payer: Self-pay

## 2018-12-25 ENCOUNTER — Encounter (INDEPENDENT_AMBULATORY_CARE_PROVIDER_SITE_OTHER): Payer: Medicare Other | Admitting: Ophthalmology

## 2018-12-25 DIAGNOSIS — H34831 Tributary (branch) retinal vein occlusion, right eye, with macular edema: Secondary | ICD-10-CM | POA: Diagnosis not present

## 2018-12-25 DIAGNOSIS — H353131 Nonexudative age-related macular degeneration, bilateral, early dry stage: Secondary | ICD-10-CM

## 2018-12-25 DIAGNOSIS — H348322 Tributary (branch) retinal vein occlusion, left eye, stable: Secondary | ICD-10-CM

## 2018-12-25 DIAGNOSIS — H2513 Age-related nuclear cataract, bilateral: Secondary | ICD-10-CM

## 2018-12-25 DIAGNOSIS — H43813 Vitreous degeneration, bilateral: Secondary | ICD-10-CM | POA: Diagnosis not present

## 2019-02-05 ENCOUNTER — Other Ambulatory Visit: Payer: Self-pay | Admitting: Internal Medicine

## 2019-02-05 DIAGNOSIS — E785 Hyperlipidemia, unspecified: Secondary | ICD-10-CM

## 2019-03-10 ENCOUNTER — Encounter: Payer: Medicare Other | Admitting: Family

## 2019-03-10 ENCOUNTER — Encounter (HOSPITAL_COMMUNITY): Payer: Medicare Other

## 2019-05-21 ENCOUNTER — Telehealth (INDEPENDENT_AMBULATORY_CARE_PROVIDER_SITE_OTHER): Payer: Medicare Other | Admitting: Internal Medicine

## 2019-05-21 VITALS — BP 118/80 | HR 84 | Ht 67.0 in | Wt 221.0 lb

## 2019-05-21 DIAGNOSIS — Q231 Congenital insufficiency of aortic valve: Secondary | ICD-10-CM | POA: Diagnosis not present

## 2019-05-21 DIAGNOSIS — R0600 Dyspnea, unspecified: Secondary | ICD-10-CM | POA: Diagnosis not present

## 2019-05-21 DIAGNOSIS — R011 Cardiac murmur, unspecified: Secondary | ICD-10-CM | POA: Diagnosis not present

## 2019-05-21 DIAGNOSIS — Q2381 Bicuspid aortic valve: Secondary | ICD-10-CM

## 2019-05-21 DIAGNOSIS — R079 Chest pain, unspecified: Secondary | ICD-10-CM

## 2019-05-21 DIAGNOSIS — R0609 Other forms of dyspnea: Secondary | ICD-10-CM

## 2019-05-21 MED ORDER — METOPROLOL TARTRATE 50 MG PO TABS: ORAL_TABLET | ORAL | 0 refills | Status: DC

## 2019-05-21 NOTE — Patient Instructions (Addendum)
Medication Instructions:  Take metoprolol tartrate (Lopressor) 50 mg (1 tablet) 2 HOURS prior to CT scan  *If you need a refill on your cardiac medications before your next appointment, please call your pharmacy*  Lab Work: BMET 1 [place] If you have labs (blood work) drawn today and your tests are completely normal, you will receive your results only by: Marland Kitchen MyChart Message (if you have MyChart) OR . A paper copy in the mail If you have any lab test that is abnormal or we need to change your treatment, we will call you to review the results.  Testing/Procedures: Your physician has requested that you have cardiac CT. Cardiac computed tomography (CT) is a painless test that uses an x-ray machine to take clear, detailed pictures of your heart. For further information please visit [email or web address]. Please follow instruction sheet as given.  Follow-Up: At [facility], you and your health needs are our priority.  As part of our continuing mission to provide you with exceptional heart care, we have created designated Provider Care Teams.  These Care Teams include your primary Cardiologist (physician) and Advanced Practice Providers (APPs -  Physician Assistants and Nurse Practitioners) who all work together to provide you with the care you need, when you need it.  Your next appointment:   2-3 months   The format for your next appointment:   In Person  Provider:     Rosaria Ferries, PA-C  Jory Sims, DNP, ANP  Cadence Kathlen Mody, NP

## 2019-05-21 NOTE — Progress Notes (Signed)
Virtual Visit via Telephone Note   This visit type was conducted due to national recommendations for restrictions regarding the COVID-19 Pandemic (e.g. social distancing) in an effort to limit this patient's exposure and mitigate transmission in our community.  Due to her co-morbid illnesses, this patient is at least at moderate risk for complications without adequate follow up.  This format is felt to be most appropriate for this patient at this time.  The patient did not have access to video technology/had technical difficulties with video requiring transitioning to audio format only (telephone).  All issues noted in this document were discussed and addressed.  No physical exam could be performed with this format.  Please refer to the patient's chart for her  consent to telehealth for [facility].   Date:  05/21/2019   ID:  Cynthia Cook, DOB [birth year], MRN [id number]  Patient Location: Home Provider Location: Other:  [place], [place]  PCP:  Shon Baton, MD  Cardiologist:  No primary care provider on file.  Electrophysiologist:  None   Evaluation Performed:  Follow-Up Visit  Chief Complaint:  Chest pain  History of Present Illness:    Cynthia Cook is a 64 y.o. female with hx of significant psychological stressors, anxiety and depression, and PTSD. Presents for follow up of chest pain and abnormal echo.   She had an echocardiogram performed on 05/02/2018 ordered by her previous primary care physician Dr. Doreatha Lew for a murmur. This demonstrated a preserved ejection fraction, grade 2 diastolic dysfunction with an E/e' of 30 consistent with elevated LV filling pressure, possible bicuspid aortic valve with very mild aortic valve stenosis, MAC with mean diastolic gradient 4 mmHg, moderately reduced systolic function of the RV, normal aortic root size.   She tells me she continues to have chest discomfort and tightness, and her activity level is limited with chest pain. She notes in  particular difficulty walking the dog due to chest discomfort and an uneasy feeling while walking. She rests and has recovery in minutes. Some continued dyspnea on exertion as she had described initially 1 year ago.   She has been unable to complete her ischemic workup due to the covid 19 pandemic but would like to do so now.   She asks if our office offers chelation therapy, with regard to our previous conversation about mitral annular calcification. I have discussed with her that we do not, and ACC/AHA say it is unclear if chelation therapy is effective for heart disease and FDA has not approved this as a therapy. I have encouraged her to seek out the opinion of her primary care doctor if she has further questions regarding this therapy.   The patient does not have symptoms concerning for COVID-19 infection (fever, chills, cough, or new shortness of breath).    Past Medical History:  Diagnosis Date  . Back pain   . Murmur   . PE (pulmonary embolism)   . Thyroid disease    No past surgical history on file.   Current Meds  Medication Sig  . Cholecalciferol (VITAMIN D3) 1.25 MG (50000 UT) CAPS Take by mouth.  . levothyroxine (SYNTHROID) 50 MCG tablet Take 50 mcg by mouth daily.  . Magnesium 200 MG TABS Take by mouth.  . Menaquinone-7 (VITAMIN K2) 100 MCG CAPS Take by mouth.  . Multiple Vitamins-Minerals (MULTIVITAMIN WITH MINERALS) tablet Take 1 tablet by mouth daily.  . Selenium 100 MCG CAPS Take by mouth.  . TURMERIC PO Take by mouth.  . vitamin C (  ASCORBIC ACID) 250 MG tablet Take 250 mg by mouth daily.  . vitamin E 400 UNIT capsule Take 400 Units by mouth daily.  . Zinc 30 MG TABS Take by mouth.     Allergies:   Codeine   Social History   Tobacco Use  . Smoking status: Never Smoker  Substance Use Topics  . Alcohol use: No  . Drug use: Not on file     Family Hx: The patient's family history is not on file.  ROS:   Please see the history of present illness.     All  other systems reviewed and are negative.   Prior CV studies:   The following studies were reviewed today:    Labs/Other Tests and Data Reviewed:    EKG:  No ECG reviewed.  Recent Labs: 09/26/2018: ALT 10; BUN 25; Creatinine, Ser 1.27; Hemoglobin 13.6; Platelets 165; Potassium 4.1; Sodium 140   Recent Lipid Panel No results found for: CHOL, TRIG, HDL, CHOLHDL, LDLCALC, LDLDIRECT  Wt Readings from Last 3 Encounters:  05/21/19 221 lb (100.2 kg)  09/26/18 230 lb (104.3 kg)  05/21/18 264 lb (119.7 kg)     Objective:    Vital Signs:  BP 118/80   Pulse 84   Ht _0  (1.702 m)   Wt 221 lb (100.2 kg)   BMI 34.61 kg/m    VITAL SIGNS:  reviewed GEN:  no acute distress NEURO:  alert and oriented x 3, no obvious focal deficit PSYCH:  normal affect  ASSESSMENT & PLAN:    1. Dyspnea on exertion   2. Chest pain, unspecified type   3. Bicuspid aortic valve    Chest discomfort/DOE- we will plan for CCTA, but will protocol the study with retrospective ECG gating so as to examine the aortic valve morphology as well as the coronary arteries for chest pain. FFR to be performed as needed. We participated in shared decision making regarding the optimal ischemic evaluation. I believe this is the best test to get as much comprehensive information as possible. We discussed need for beta blockade and nitro to optimize the scan, and she is agreeable to proceed with this plan. Plan to scan aorta during this study as well in the event she truly has a congenital bicuspid aortic valve.   Hyperlipidemia - not currently on therapy. Patient prefers non pharmacologic options for therapy. Will address with results of CCTA when available.   COVID-19 Education: The signs and symptoms of COVID-19 were discussed with the patient and how to seek care for testing (follow up with PCP or arrange E-visit).  The importance of social distancing was discussed today.  Time:   Today, I have spent 25 minutes with the  patient with telehealth technology discussing the above problems.     Medication Adjustments/Labs and Tests Ordered: Current medicines are reviewed at length with the patient today.  Concerns regarding medicines are outlined above.   Tests Ordered: No orders of the defined types were placed in this encounter.   Medication Changes: No orders of the defined types were placed in this encounter.   Follow Up:  after CCTA   Signed, Elouise Munroe, MD  05/21/2019 8:45 AM    [place]

## 2019-06-06 ENCOUNTER — Encounter: Payer: Self-pay | Admitting: Internal Medicine

## 2019-06-17 ENCOUNTER — Telehealth: Payer: Self-pay | Admitting: Internal Medicine

## 2019-06-17 NOTE — Telephone Encounter (Signed)
Patient states that she was to have a CT Cardiac Scoring test done back in October. Unfortunately, that was never set up. She is requesting that she have a new order put in by Dr. Alveda Reasons, if possible, that way she can have this order scheduled with her CT Cardiac that is scheduled for 07/04/19. Please advise if this is something Dr. Margaretann Loveless can do for her or if she needs to contact her PCP.

## 2019-06-18 NOTE — Telephone Encounter (Signed)
Called spoke to patient. Informed patient she would not need to have the calcium scoring  If she was going to have CCTA. This test was built in to  The CCTA  Patient wanted to know the the difference between looking at th calcification of arteries and the plaque build up  From the CCTA report.  Rn attempt to explain but unsuccessful.  RN informed patient will discuss with Dr Margaretann Loveless.  Will contact patient of Dr Margaretann Loveless explanation  patient verbalized understanding.

## 2019-06-18 NOTE — Telephone Encounter (Signed)
Since she is having chest discomfort, CCTA is the most appropriate test. Calcium scoring will not give [place] the information needed in this scenario.   Calcium scoring does not give information about potential soft plaques, which can be a source of chest pain even in the absence of coronary calcium.   I only perform calcium scoring by itself on patients who are not having cardiovascular symptoms.  If there are further questions I'm happy to arrange a virtual visit to discuss.

## 2019-06-18 NOTE — Telephone Encounter (Addendum)
Spoke to patient . long conversation  PATIENT HAS DECIDE SHE WOULD GET A SECOND OPINION FROM ANOTHER CARDIOLOGIST WITH IN THE PRACTICE. RN  I SCHEDULE  APPOINTMENT. PATIENT VERBALIZED UNDERSTANDING

## 2019-06-23 ENCOUNTER — Telehealth (INDEPENDENT_AMBULATORY_CARE_PROVIDER_SITE_OTHER): Payer: Medicare Other | Admitting: Cardiology

## 2019-06-23 VITALS — BP 146/88 | HR 88 | Ht 67.0 in | Wt 235.0 lb

## 2019-06-23 DIAGNOSIS — Z7689 Persons encountering health services in other specified circumstances: Secondary | ICD-10-CM | POA: Diagnosis not present

## 2019-06-23 DIAGNOSIS — R931 Abnormal findings on diagnostic imaging of heart and coronary circulation: Secondary | ICD-10-CM

## 2019-06-23 DIAGNOSIS — E785 Hyperlipidemia, unspecified: Secondary | ICD-10-CM | POA: Diagnosis not present

## 2019-06-23 DIAGNOSIS — R9431 Abnormal electrocardiogram [ECG] [EKG]: Secondary | ICD-10-CM | POA: Diagnosis not present

## 2019-06-23 DIAGNOSIS — E7800 Pure hypercholesterolemia, unspecified: Secondary | ICD-10-CM

## 2019-06-23 MED ORDER — EZETIMIBE 10 MG PO TABS: 10.0000 mg | ORAL_TABLET | Freq: Every day | ORAL | 0 refills | Status: DC

## 2019-06-23 NOTE — Patient Instructions (Signed)
Medication Instructions:  Start: Zetia 10 mg daily  *If you need a refill on your cardiac medications before your next appointment, please call your pharmacy*   Lab Work: None   Testing/Procedures: None   Follow-Up: At [facility], you and your health needs are our priority.  As part of our continuing mission to provide you with exceptional heart care, we have created designated Provider Care Teams.  These Care Teams include your primary Cardiologist (physician) and Advanced Practice Providers (APPs -  Physician Assistants and Nurse Practitioners) who all work together to provide you with the care you need, when you need it.  We recommend signing up for the patient portal called "MyChart".  Sign up information is provided on this After Visit Summary.  MyChart is used to connect with patients for Virtual Visits (Telemedicine).  Patients are able to view lab/test results, encounter notes, upcoming appointments, etc.  Non-urgent messages can be sent to your provider as well.   To learn more about what you can do with MyChart, go to [email or web address].    Your next appointment:   1 month(s)  The format for your next appointment:   Either In Person or Virtual  Provider:   Buford Dresser, MD

## 2019-06-23 NOTE — Progress Notes (Signed)
Virtual Visit via Telephone Note   This visit type was conducted due to national recommendations for restrictions regarding the COVID-19 Pandemic (e.g. social distancing) in an effort to limit this patient's exposure and mitigate transmission in our community.  Due to her co-morbid illnesses, this patient is at least at moderate risk for complications without adequate follow up.  This format is felt to be most appropriate for this patient at this time.  The patient did not have access to video technology/had technical difficulties with video requiring transitioning to audio format only (telephone).  All issues noted in this document were discussed and addressed.  No physical exam could be performed with this format.  Please refer to the patient's chart for her  consent to telehealth for [facility].   The patient was identified using 2 identifiers.  Date:  06/23/2019   ID:  Cynthia Cook, DOB [birth year], MRN [id number]  Patient Location: Home Provider Location: Home  PCP:  Shon Baton, MD  Cardiologist:  No primary care provider on file.  Electrophysiologist:  None   Evaluation Performed:  Follow-Up Visit  Chief Complaint:  Second opinion for CV risk and management  History of Present Illness:    Cynthia Cook is a 64 y.o. female with cardiac history of chest pain and abnormal echo. She has previously been seen by Dr. Margaretann Loveless but requested second opinion regarding management of calcium and CV risk.   The patient does not have symptoms concerning for COVID-19 infection (fever, chills, cough, or new shortness of breath).   Today: At her peak weight, she was at 400 lbs. Lost 60 lbs last year, but comforts herself with food. After the loss of a close friend last year, she gained 30 lbs back. She has felt less well since regaining the weight. She can't walk her dog, can't walk more than 200 ft at a stretch. She is a Equities trader with research experience, and she is interested in  alternative medicine. She is frustrated that options such as chelation therapy are not offered. She feels like she cannot stop eating.  We discussed chelation therapy and calcium at length today. She is interested in EDTA therapy to dissolve and remove her mitral annular calcification. We discussed FDA approved indications for chelation therapy. She feels that throughout her life she has had an imbalance of calcium with heel spurs, shoulder points, etc.   She refuses to consider statins, as she worked on the [employer] trial. We discussed the data on this extensively. We discussed PCSK9i, but that this requires demonstration of CAD and intolerance of statins. She is amenable to trialing ezetimibe as she is familiar with the data.  She is also requesting recommendations for therapy/psychologist given her coping mechanisms with food. She has never had specific therapy for this. She feels this is a chocolate addiction specifically that she needs to work through.   Labs reviewed in [place]. From 02/03/19, Tchol 238, HDL 46, LDL 176, TG 94. This was before her weight re-gain, and she is worried that her numbers are worse.  She is scheduled for CT cardiac to evaluate for CAD and coronary calcium.   Past Medical History:  Diagnosis Date  . Back pain   . Murmur   . PE (pulmonary embolism)   . Thyroid disease    No past surgical history on file.   Current Meds  Medication Sig  . allopurinol (ZYLOPRIM) 100 MG tablet Take 100 mg by mouth daily.  . Cholecalciferol (VITAMIN D3) 1.25 MG (  50000 UT) CAPS Take by mouth.  . levothyroxine (SYNTHROID) 50 MCG tablet Take 50 mcg by mouth daily.  . Magnesium 200 MG TABS Take by mouth.  . Menaquinone-7 (VITAMIN K2) 100 MCG CAPS Take by mouth.  . metoprolol tartrate (LOPRESSOR) 50 MG tablet Take 50 mg (1 tablet) TWO hours prior to CT  . Multiple Vitamins-Minerals (MULTIVITAMIN WITH MINERALS) tablet Take 1 tablet by mouth daily.  . Selenium 100 MCG CAPS Take by  mouth.  . TURMERIC PO Take by mouth.  . vitamin C (ASCORBIC ACID) 250 MG tablet Take 250 mg by mouth daily.  . vitamin E 400 UNIT capsule Take 400 Units by mouth daily.  . Zinc 30 MG TABS Take by mouth.     Allergies:   Codeine   Social History   Tobacco Use  . Smoking status: Never Smoker  Substance Use Topics  . Alcohol use: No  . Drug use: Not on file     Family Hx: No family history of premature CAD or sudden cardiac death.  ROS:   Please see the history of present illness.    All other systems reviewed and are negative.   Prior CV studies:   The following studies were reviewed today: Echo 05/02/18 Left ventricle: The cavity size was normal. There was mild  concentric hypertrophy. Systolic function was normal. The  estimated ejection fraction was in the range of 55% to 60%. Wall  motion was normal; there were no regional wall motion  abnormalities. Features are consistent with a pseudonormal left  ventricular filling pattern, with concomitant abnormal relaxation  and increased filling pressure (grade 2 diastolic dysfunction).  - Aortic valve: Possibly bicuspid; moderately thickened, moderately  calcified leaflets. Valve mobility was mildly restricted. There  was very mild stenosis. There was trivial regurgitation.  - Mitral valve: Calcified annulus. Mildly thickened leaflets .  Mobility was mildly restricted. The findings are consistent with  mild stenosis. There was mild regurgitation. Mean gradient (D): 4  mm Hg.  - Right ventricle: Systolic function was moderately reduced.  - Atrial septum: No defect or patent foramen ovale was identified.  - Pericardium, extracardiac: A trivial pericardial effusion was  identified. Features were not consistent with tamponade  physiology.   Impressions:   - Normal LV EF. Thickened mitral annulus; no prior studies  available, but consistent with significant mitral annular  calcification or prior  mitral valve surgery. Mild mitral  stenosis. Mitral leaflets not well visualized. No prior cardiac  records or testing available for confirmation.   Labs/Other Tests and Data Reviewed:    EKG:  An ECG dated 09/27/18 was personally reviewed today and demonstrated:  SR, IVCD  Recent Labs: 09/26/2018: ALT 10; BUN 25; Creatinine, Ser 1.27; Hemoglobin 13.6; Platelets 165; Potassium 4.1; Sodium 140   Recent Lipid Panel No results found for: CHOL, TRIG, HDL, CHOLHDL, LDLCALC, LDLDIRECT  Wt Readings from Last 3 Encounters:  06/23/19 235 lb (106.6 kg)  05/21/19 221 lb (100.2 kg)  09/26/18 230 lb (104.3 kg)     Objective:    Vital Signs:  BP (!) 146/88   Pulse 88   Ht 5\' 7"  (1.702 m)   Wt 235 lb (106.6 kg)   BMI 36.81 kg/m    Speaking comfortably on the phone, no audible wheezing In no acute distress Alert and oriented Normal affect Normal speech  ASSESSMENT & PLAN:    Chest pain history, abnormal echo, dyslipidemia: We discussed options for management at length today. I recommended proceeding  with the CTA to evaluate for coronary artery disease. I also discussed guideline recommended therapy for prevention of CAD.  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 I discussed clinical indications for this, and mitral annular calcification (and calcium in general) is not an approved use of this therapy. I explained the chemistry and risk of EDTA in a non-indicated situation.  We also spoke about the management of CV risk. She is adamant that she will not consider statin, but she is amenable to trial of ezetimibe given her work on the [employer] trial. She is interested in [place] but does not have an indication for this currently.  I would recommend that she have repeat lipids with her BMET prior to her CT. We do these at our office, but she would prefer to do drive through at Dr. Keane Police  office. We attempted to fax these requests over to the office today. I did review that our radiologists cannot see these results, and they are not automatically released to [place], so she would need to have copies of the results sent back to [facility].   We will follow up in 1 mos to discuss the results of the CT and talk about next steps.  COVID-19 Education: The signs and symptoms of COVID-19 were discussed with the patient and how to seek care for testing (follow up with PCP or arrange E-visit).  The importance of social distancing was discussed today.  Time:   Today, I have spent 30 minutes with the patient with telehealth technology discussing the above problems.    Patient Instructions  Medication Instructions:  Start: Zetia 10 mg daily  *If you need a refill on your cardiac medications before your next appointment, please call your pharmacy*   Lab Work: None   Testing/Procedures: None   Follow-Up: At [facility], you and your health needs are our priority.  As part of our continuing mission to provide you with exceptional heart care, we have created designated Provider Care Teams.  These Care Teams include your primary Cardiologist (physician) and Advanced Practice Providers (APPs -  Physician Assistants and Nurse Practitioners) who all work together to provide you with the care you need, when you need it.  We recommend signing up for the patient portal called "MyChart".  Sign up information is provided on this After Visit Summary.  MyChart is used to connect with patients for Virtual Visits (Telemedicine).  Patients are able to view lab/test results, encounter notes, upcoming appointments, etc.  Non-urgent messages can be sent to your provider as well.   To learn more about what you can do with MyChart, go to [email or web address].    Your next appointment:   1 month(s)  The format for your next appointment:   Either In Person or Virtual  Provider:   Buford Dresser,  MD      Signed, Buford Dresser, MD  06/23/2019 [place]

## 2019-06-30 ENCOUNTER — Encounter: Payer: Self-pay | Admitting: Cardiology

## 2019-07-04 ENCOUNTER — Ambulatory Visit (HOSPITAL_COMMUNITY): Payer: Medicare Other

## 2019-07-30 ENCOUNTER — Telehealth: Payer: Medicare Other | Admitting: Cardiology

## 2020-02-05 ENCOUNTER — Emergency Department (HOSPITAL_COMMUNITY): Payer: Medicare Other

## 2020-02-05 ENCOUNTER — Other Ambulatory Visit: Payer: Self-pay

## 2020-02-05 ENCOUNTER — Encounter (HOSPITAL_COMMUNITY): Payer: Self-pay

## 2020-02-05 ENCOUNTER — Inpatient Hospital Stay (HOSPITAL_COMMUNITY)
Admission: EM | Admit: 2020-02-05 | Discharge: 2020-02-07 | DRG: 309 | Disposition: A | Discharge status: Home / Self Care | Payer: Medicare Other | Attending: Internal Medicine | Admitting: Internal Medicine

## 2020-02-05 DIAGNOSIS — Z79890 Hormone replacement therapy (postmenopausal): Secondary | ICD-10-CM

## 2020-02-05 DIAGNOSIS — I2699 Other pulmonary embolism without acute cor pulmonale: Secondary | ICD-10-CM | POA: Diagnosis not present

## 2020-02-05 DIAGNOSIS — R0902 Hypoxemia: Secondary | ICD-10-CM

## 2020-02-05 DIAGNOSIS — I4891 Unspecified atrial fibrillation: Secondary | ICD-10-CM | POA: Diagnosis not present

## 2020-02-05 DIAGNOSIS — I080 Rheumatic disorders of both mitral and aortic valves: Secondary | ICD-10-CM | POA: Diagnosis present

## 2020-02-05 DIAGNOSIS — R011 Cardiac murmur, unspecified: Secondary | ICD-10-CM | POA: Diagnosis present

## 2020-02-05 DIAGNOSIS — I2782 Chronic pulmonary embolism: Secondary | ICD-10-CM | POA: Diagnosis present

## 2020-02-05 DIAGNOSIS — Z885 Allergy status to narcotic agent status: Secondary | ICD-10-CM

## 2020-02-05 DIAGNOSIS — Z79899 Other long term (current) drug therapy: Secondary | ICD-10-CM

## 2020-02-05 DIAGNOSIS — M542 Cervicalgia: Secondary | ICD-10-CM | POA: Diagnosis present

## 2020-02-05 DIAGNOSIS — E669 Obesity, unspecified: Secondary | ICD-10-CM | POA: Diagnosis present

## 2020-02-05 DIAGNOSIS — M109 Gout, unspecified: Secondary | ICD-10-CM | POA: Diagnosis present

## 2020-02-05 DIAGNOSIS — W19XXXA Unspecified fall, initial encounter: Secondary | ICD-10-CM | POA: Diagnosis present

## 2020-02-05 DIAGNOSIS — I4581 Long QT syndrome: Secondary | ICD-10-CM | POA: Diagnosis present

## 2020-02-05 DIAGNOSIS — Z20822 Contact with and (suspected) exposure to covid-19: Secondary | ICD-10-CM | POA: Diagnosis present

## 2020-02-05 DIAGNOSIS — I2510 Atherosclerotic heart disease of native coronary artery without angina pectoris: Secondary | ICD-10-CM | POA: Diagnosis present

## 2020-02-05 DIAGNOSIS — E785 Hyperlipidemia, unspecified: Secondary | ICD-10-CM

## 2020-02-05 DIAGNOSIS — I447 Left bundle-branch block, unspecified: Secondary | ICD-10-CM | POA: Diagnosis present

## 2020-02-05 DIAGNOSIS — E039 Hypothyroidism, unspecified: Secondary | ICD-10-CM | POA: Diagnosis present

## 2020-02-05 DIAGNOSIS — R531 Weakness: Secondary | ICD-10-CM | POA: Diagnosis present

## 2020-02-05 DIAGNOSIS — R55 Syncope and collapse: Secondary | ICD-10-CM | POA: Diagnosis present

## 2020-02-05 DIAGNOSIS — N1831 Chronic kidney disease, stage 3a: Secondary | ICD-10-CM | POA: Diagnosis present

## 2020-02-05 DIAGNOSIS — I2584 Coronary atherosclerosis due to calcified coronary lesion: Secondary | ICD-10-CM | POA: Diagnosis present

## 2020-02-05 DIAGNOSIS — Z7982 Long term (current) use of aspirin: Secondary | ICD-10-CM

## 2020-02-05 DIAGNOSIS — Z6836 Body mass index (BMI) 36.0-36.9, adult: Secondary | ICD-10-CM

## 2020-02-05 DIAGNOSIS — N179 Acute kidney failure, unspecified: Secondary | ICD-10-CM | POA: Diagnosis present

## 2020-02-05 HISTORY — DX: Other pulmonary embolism without acute cor pulmonale: I26.99

## 2020-02-05 LAB — URINALYSIS, ROUTINE W REFLEX MICROSCOPIC
Bilirubin Urine: NEGATIVE
Glucose, UA: NEGATIVE mg/dL
Hgb urine dipstick: NEGATIVE
Ketones, ur: 20 mg/dL — AB
Leukocytes,Ua: NEGATIVE
Nitrite: NEGATIVE
Protein, ur: 30 mg/dL — AB
Specific Gravity, Urine: 1.023 (ref 1.005–1.030)
pH: 5 (ref 5.0–8.0)

## 2020-02-05 LAB — D-DIMER, QUANTITATIVE: D-Dimer, Quant: 1.26 ug/mL-FEU — ABNORMAL HIGH (ref 0.00–0.50)

## 2020-02-05 LAB — BASIC METABOLIC PANEL
Anion gap: 14 (ref 5–15)
BUN: 38 mg/dL — ABNORMAL HIGH (ref 8–23)
CO2: 26 mmol/L (ref 22–32)
Calcium: 9.3 mg/dL (ref 8.9–10.3)
Chloride: 100 mmol/L (ref 98–111)
Creatinine, Ser: 1.71 mg/dL — ABNORMAL HIGH (ref 0.44–1.00)
GFR, Estimated: 33 mL/min — ABNORMAL LOW (ref >60)
Glucose, Bld: 154 mg/dL — ABNORMAL HIGH (ref 70–99)
Potassium: 4.6 mmol/L (ref 3.5–5.1)
Sodium: 140 mmol/L (ref 135–145)

## 2020-02-05 LAB — RESPIRATORY PANEL BY RT PCR (FLU A&B, COVID)
Influenza A by PCR: NEGATIVE
Influenza B by PCR: NEGATIVE
SARS Coronavirus 2 by RT PCR: NEGATIVE

## 2020-02-05 LAB — CBC
HCT: 44.6 % (ref 36.0–46.0)
Hemoglobin: 14.2 g/dL (ref 12.0–15.0)
MCH: 29.2 pg (ref 26.0–34.0)
MCHC: 31.8 g/dL (ref 30.0–36.0)
MCV: 91.6 fL (ref 80.0–100.0)
Platelets: 161 10*3/uL (ref 150–400)
RBC: 4.87 MIL/uL (ref 3.87–5.11)
RDW: 13.7 % (ref 11.5–15.5)
WBC: 9.9 10*3/uL (ref 4.0–10.5)
nRBC: 0 % (ref 0.0–0.2)

## 2020-02-05 LAB — TROPONIN I (HIGH SENSITIVITY)
Troponin I (High Sensitivity): 17 ng/L (ref <18)
Troponin I (High Sensitivity): 24 ng/L — ABNORMAL HIGH (ref <18)

## 2020-02-05 LAB — BRAIN NATRIURETIC PEPTIDE: B Natriuretic Peptide: 341.7 pg/mL — ABNORMAL HIGH (ref 0.0–100.0)

## 2020-02-05 LAB — CBG MONITORING, ED: Glucose-Capillary: 140 mg/dL — ABNORMAL HIGH (ref 70–99)

## 2020-02-05 MED ORDER — HEPARIN (PORCINE) 25000 UT/250ML-% IV SOLN
1400.0000 [IU]/h | INTRAVENOUS | Status: AC
  Administered 2020-02-05 – 2020-02-06 (×2): 1400 [IU]/h via INTRAVENOUS
  Filled 2020-02-05 (×2): qty 250

## 2020-02-05 MED ORDER — HEPARIN BOLUS VIA INFUSION
5400.0000 [IU] | Freq: Once | INTRAVENOUS | Status: AC
  Administered 2020-02-05: 5400 [IU] via INTRAVENOUS
  Filled 2020-02-05: qty 5400

## 2020-02-05 MED ORDER — IOHEXOL 350 MG/ML SOLN
100.0000 mL | Freq: Once | INTRAVENOUS | Status: AC | PRN
  Administered 2020-02-05: 68 mL via INTRAVENOUS

## 2020-02-05 MED ORDER — SODIUM CHLORIDE 0.9 % IV BOLUS
1000.0000 mL | Freq: Once | INTRAVENOUS | Status: AC
  Administered 2020-02-05: 1000 mL via INTRAVENOUS

## 2020-02-05 NOTE — Progress Notes (Signed)
ANTICOAGULATION CONSULT NOTE - Initial Consult  Pharmacy Consult for heparin gtt Indication: pulmonary embolus  Allergies  Allergen Reactions  . Codeine Nausea Only    Patient Measurements: Height: 5\' 6"  (167.6 cm) Weight: 103.4 kg (228 lb) IBW/kg (Calculated) : 59.3 Heparin Dosing Weight: 82.9kg  Vital Signs: Temp: 97.4 F (36.3 C) (10/28 1656) Temp Source: Oral (10/28 1656) BP: 118/88 (10/28 2010) Pulse Rate: 111 (10/28 2005)  Labs: Recent Labs    02/05/20 1804  HGB 14.2  HCT 44.6  PLT 161  CREATININE 1.71*  TROPONINIHS 17    Estimated Creatinine Clearance: 39.3 mL/min (A) (by C-G formula based on SCr of 1.71 mg/dL (H)).   Medical History: Past Medical History:  Diagnosis Date  . Back pain   . Murmur   . PE (pulmonary embolism)   . Thyroid disease      Assessment: 64 year old female presenting with fatigue and mild shortness of breath. Pt has past history of provoked PE (20 years ago) and is not on anticoagulation PTA. Pt has noted to be in afib on evaluation. Pharmacy is consulted to dose heparin in the setting of possible chronic PE as visualized on chest CT.   CBC WNL Hgb 14.2; Plt 161; d-dimer 1.26   Goal of Therapy:  Heparin level 0.3-0.7 units/ml Monitor platelets by anticoagulation protocol: Yes   Plan:  Heparin bolus 5400 units x1 Heparin infusion 1400 units/hr  6h HL 0500 and daily HL ordered Monitor CBC, s/sx bleeding, HL, and clinical progress   Carolin Guernsey  PGY1 Pharmacy Resident 02/05/2020,9:21 PM

## 2020-02-05 NOTE — ED Provider Notes (Signed)
[facility] EMERGENCY DEPARTMENT Provider Note   CSN: [id number] Arrival date & time: 02/05/20  1633     History Chief Complaint  Patient presents with  . 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 female.  The history is provided by the patient and medical records. No language interpreter was used.  Weakness    64 year old female significant history of thyroid disease, PE not on any anticoagulants, brought here via EMS for evaluation of weakness and syncope.  Patient report for the past 6 days she has not been feeling well.  Patient states she feels wiped out, generalized fatigue, and having posterior neck discomfort.  She also endorsed mild shortness of breath.  She did have an appointment with her PCP today and before she leaving the house to go to her appointment she had a syncopal episode.  Patient states this is an unwitnessed syncopal episode.  She was about to walk out of the house and syncopized falling down to a sitting position.  She did report hitting her head but denies any significant headache.  She does not complain of any confusion, nausea, vomiting, chest pain, abdominal pain, dysuria, focal numbness or focal weakness.  She denies runny nose sneezing coughing sore throat loss of taste or smell.  She has not been vaccinated for COVID-19.  No recent medication change.  No change in her daily activity.  She is not on home O2.  Initially EMS was about to place a c-collar and nasal cannula but patient states she felt she could not breathe with the c-collar in place and request for it to be removed.  She does not complain of any active neck pain at this time.  She mention her PE was approximately 20 years ago and it was due to having several prolonged flight.  No known history of A. fib.  No recent sick contact.  Past Medical  History:  Diagnosis Date  . Back pain   . Murmur   . PE (pulmonary embolism)   . Thyroid disease     There are no problems to display for this patient.   No past surgical history on file.   OB History   No obstetric history on file.     No family history on file.  Social History   Tobacco Use  . Smoking status: Never Smoker  . Smokeless tobacco: Never Used  Vaping Use  . Vaping Use: Never used  Substance Use Topics  . Alcohol use: No  . Drug use: Not on file    Home Medications Prior to Admission medications   Medication Sig Start Date End Date Taking? Authorizing Provider  allopurinol (ZYLOPRIM) 100 MG tablet Take 100 mg by mouth daily.    [provider]  Cholecalciferol (VITAMIN D3) 1.25 MG (50000 UT) CAPS Take by mouth.    [provider]  ezetimibe (ZETIA) 10 MG tablet Take 1 tablet (10 mg total) by mouth daily. 06/23/19 06/17/20  Buford Dresser, MD  levothyroxine (SYNTHROID) 50 MCG tablet Take 50 mcg by mouth daily. 09/11/18   [provider]  Magnesium 200 MG TABS Take by mouth.    [provider]  Menaquinone-7 (VITAMIN K2) 100 MCG CAPS Take by mouth.    [provider]  metoprolol tartrate (LOPRESSOR) 50 MG tablet Take 50 mg (1  tablet) TWO hours prior to CT 05/21/19   Elouise Munroe, MD  Multiple Vitamins-Minerals (MULTIVITAMIN WITH MINERALS) tablet Take 1 tablet by mouth daily.    [provider]  Selenium 100 MCG CAPS Take by mouth.    [provider]  TURMERIC PO Take by mouth.    [provider]  vitamin C (ASCORBIC ACID) 250 MG tablet Take 250 mg by mouth daily.    [provider]  vitamin E 400 UNIT capsule Take 400 Units by mouth daily.    [provider]  Zinc 30 MG TABS Take by mouth.    [provider]    Allergies    Codeine  Review of Systems   Review of Systems  Neurological: Positive for weakness.  All other systems reviewed and are  negative.   Physical Exam Updated Vital Signs BP 118/76 (BP Location: Left Arm)   Pulse (!) 106   Temp (!) 97.4 F (36.3 C) (Oral)   Resp 16   Ht 5\' 6"  (1.676 m)   Wt 103.4 kg   BMI 36.80 kg/m   Physical Exam Vitals and nursing note reviewed.  Constitutional:      General: She is not in acute distress.    Appearance: She is well-developed. She is obese.  HENT:     Head: Normocephalic and atraumatic.     Comments: No significant scalp tenderness or signs of head trauma. Eyes:     Extraocular Movements: Extraocular movements intact.     Conjunctiva/sclera: Conjunctivae normal.     Pupils: Pupils are equal, round, and reactive to light.  Cardiovascular:     Rate and Rhythm: Tachycardia present. Rhythm irregular.     Pulses: Normal pulses.     Heart sounds: Normal heart sounds.  Pulmonary:     Effort: Pulmonary effort is normal.     Breath sounds: Normal breath sounds. No wheezing, rhonchi or rales.  Abdominal:     Palpations: Abdomen is soft.     Tenderness: There is no abdominal tenderness.  Musculoskeletal:     Cervical back: Normal range of motion and neck supple. Tenderness (Mild cervical paraspinal muscle tenderness with normal neck range of motion.  No significant step-off or crepitus.) present. No rigidity.     Comments: 5 out of 5 strength to all 4 extremities.  Skin:    Capillary Refill: Capillary refill takes less than 2 seconds.     Findings: No rash.  Neurological:     Mental Status: She is alert and oriented to person, place, and time.     GCS: GCS eye subscore is 4. GCS verbal subscore is 5. GCS motor subscore is 6.     Cranial Nerves: Cranial nerves are intact.     Sensory: Sensation is intact.     Motor: Motor function is intact.  Psychiatric:        Mood and Affect: Mood normal.     ED Results / Procedures / Treatments   Labs (all labs ordered are listed, but only abnormal results are displayed) Labs Reviewed  BASIC METABOLIC PANEL - Abnormal;  Notable for the following components:      Result Value   Glucose, Bld 154 (*)    BUN 38 (*)    Creatinine, Ser 1.71 (*)    GFR, Estimated 33 (*)    All other components within normal limits  URINALYSIS, ROUTINE W REFLEX MICROSCOPIC - Abnormal; Notable for the following components:   Color, Urine AMBER (*)  APPearance CLOUDY (*)    Ketones, ur 20 (*)    Protein, ur 30 (*)    Bacteria, UA RARE (*)    All other components within normal limits  BRAIN NATRIURETIC PEPTIDE - Abnormal; Notable for the following components:   B Natriuretic Peptide 341.7 (*)    All other components within normal limits  D-DIMER, QUANTITATIVE (NOT AT [facility]) - Abnormal; Notable for the following components:   D-Dimer, Quant 1.26 (*)    All other components within normal limits  CBG MONITORING, ED - Abnormal; Notable for the following components:   Glucose-Capillary 140 (*)    All other components within normal limits  TROPONIN I (HIGH SENSITIVITY) - Abnormal; Notable for the following components:   Troponin I (High Sensitivity) 24 (*)    All other components within normal limits  RESPIRATORY PANEL BY RT PCR (FLU A&B, COVID)  CBC  HEPARIN LEVEL (UNFRACTIONATED)  TROPONIN I (HIGH SENSITIVITY)    EKG EKG Interpretation  Date/Time:  Thursday February 05 2020 16:47:38 EDT Ventricular Rate:  109 PR Interval:    QRS Duration: 109 QT Interval:  366 QTC Calculation: 493 R Axis:   55 Text Interpretation: Atrial fibrillation Incomplete left bundle branch block Borderline prolonged QT interval Since last tracing Atrial fibrillation NOW PRESENT Confirmed by Calvert Cantor [phone]) on 02/05/2020 5:05:10 PM   Radiology CT HEAD WO CONTRAST  Result Date: 02/05/2020 CLINICAL DATA:  64 year old female with trauma. EXAM: CT HEAD WITHOUT CONTRAST CT CERVICAL SPINE WITHOUT CONTRAST TECHNIQUE: Multidetector CT imaging of the head and cervical spine was performed following the standard protocol without intravenous  contrast. Multiplanar CT image reconstructions of the cervical spine were also generated. COMPARISON:  None. FINDINGS: CT HEAD FINDINGS Brain: Mild age-related atrophy and chronic microvascular ischemic changes. There is no acute intracranial hemorrhage. No mass effect or midline shift. No extra-axial fluid collection. Vascular: No hyperdense vessel or unexpected calcification. Skull: Normal. Negative for fracture or focal lesion. Sinuses/Orbits: No acute finding. Other: None CT CERVICAL SPINE FINDINGS Alignment: No acute subluxation. Skull base and vertebrae: No acute fracture. Soft tissues and spinal canal: No prevertebral fluid or swelling. No visible canal hematoma. Disc levels:  Degenerative changes. Upper chest: There is pulmonary vascular prominence with cephalization, likely vascular congestion. Clinical correlation is recommended. Other: Bilateral carotid bulb calcified plaques. IMPRESSION: 1. No acute intracranial pathology. Mild age-related atrophy and chronic microvascular ischemic changes. 2. No acute/traumatic cervical spine pathology. Electronically Signed   By: Anner Crete M.D.   On: 02/05/2020 18:39   CT Angio Chest PE W and/or Wo Contrast  Result Date: 02/05/2020 CLINICAL DATA:  Positive D-dimer, hypoxia, syncope, history of prior PE EXAM: CT ANGIOGRAPHY CHEST WITH CONTRAST TECHNIQUE: Multidetector CT imaging of the chest was performed using the standard protocol during bolus administration of intravenous contrast. Multiplanar CT image reconstructions and MIPs were obtained to evaluate the vascular anatomy. CONTRAST:  70mL OMNIPAQUE IOHEXOL 350 MG/ML SOLN COMPARISON:  Radiograph 02/05/2020 FINDINGS: Cardiovascular: While there is satisfactory central pulmonary arterial opacification. Contrast opacification of pulmonary arteries is significantly diminished beyond the central and lobar segments. However, there are several areas which are concerning for pulmonary artery emboli. The first  being some thin linear almost web-like filling defects present in the right intralobar pulmonary artery which could suggest some chronic embolic disease albeit with more expanded and unopacified segmental branches in the posteromedial basal segments of the right lower lobe as well as several segmental branches in the left lower lobe as well. Central  pulmonary arteries are top-normal caliber. RV/LV ratio is maintained. Cardiac size is top normal. Three-vessel coronary artery calcifications are present. Dense calcification is present in the mitral annulus. Additional calcifications are present upon the aortic leaflets. Atherosclerotic plaque within the normal caliber aorta. Suboptimal opacification for luminal evaluation of the aorta. No gross aortic abnormality is seen. Normal 3 vessel branching of the aortic arch. Proximal great vessels are calcified and slightly tortuous but otherwise unremarkable. No major venous abnormalities are seen. Mediastinum/Nodes: No mediastinal fluid or gas. Normal thyroid gland and thoracic inlet. No acute abnormality of the trachea. Small hiatal hernia without other acute esophageal abnormality. No worrisome mediastinal, hilar or axillary adenopathy. Lungs/Pleura: Lung volumes are low with dependent atelectatic change and additional bandlike areas of opacity in both lower lobes and the right middle lobe likely reflecting further subsegmental atelectasis or scarring. No pneumothorax or pleural effusion. Some mild mosaic attenuation may reflect air trapping or atelectatic change accentuated by imaging during exhalation. 7 mm juxta phrenic nodule seen in the right lower lobe (6/84). No other concerning pulmonary nodules or masses. Upper Abdomen: No acute abnormalities present in the visualized portions of the upper abdomen. Musculoskeletal: Multilevel degenerative changes are present in the imaged portions of the spine. Additional degenerative changes in the shoulders. No worrisome chest  wall lesions. Review of the MIP images confirms the above findings. IMPRESSION: 1. Excellent opacification of the central lobar pulmonary arteries however there is mixing artifact within the more distal segmental and subsegmental branches. Web-like filling defects are seen in the right intralobar pulmonary artery which could suggest some chronic pulmonary embolus in this patient history of prior embolic disease. Suspect additional filling defects within segmental and subsegmental branches of both lower lobes though these may be accentuated by mixing artifact. No convincing CT evidence of right heart strain at this time. 2. Cardiomegaly. Extensive calcifications upon the mitral annulus and to a lesser extent the aortic leaflets may warrant further evaluation with echocardiography to assess for underlying valvular dysfunction. Three-vessel coronary artery calcifications. 3. Low lung volumes and basilar atelectatic changes with some more mosaic attenuation likely reflecting further atelectasis or air trapping. 4. 7 mm juxta phrenic nodule in the right lower lobe. Non-contrast chest CT at 6-12 months is recommended. If the nodule is stable at time of repeat CT, then future CT at 18-24 months (from today's scan) is considered optional for low-risk patients, but is recommended for high-risk patients. This recommendation follows the consensus statement: Guidelines for Management of Incidental Pulmonary Nodules Detected on CT Images: From the Fleischner Society 2017; Radiology 2017; 284:228-243. 5. Aortic Atherosclerosis (ICD10-I70.0). Critical Value/emergent results were called by telephone at the time of interpretation on 02/05/2020 at 9:12 pm to provider [facility] , who verbally acknowledged these results. Electronically Signed   By: Lovena Le M.D.   On: 02/05/2020 21:13   CT Cervical Spine Wo Contrast  Result Date: 02/05/2020 CLINICAL DATA:  64 year old female with trauma. EXAM: CT HEAD WITHOUT CONTRAST CT  CERVICAL SPINE WITHOUT CONTRAST TECHNIQUE: Multidetector CT imaging of the head and cervical spine was performed following the standard protocol without intravenous contrast. Multiplanar CT image reconstructions of the cervical spine were also generated. COMPARISON:  None. FINDINGS: CT HEAD FINDINGS Brain: Mild age-related atrophy and chronic microvascular ischemic changes. There is no acute intracranial hemorrhage. No mass effect or midline shift. No extra-axial fluid collection. Vascular: No hyperdense vessel or unexpected calcification. Skull: Normal. Negative for fracture or focal lesion. Sinuses/Orbits: No acute finding. Other: None CT  CERVICAL SPINE FINDINGS Alignment: No acute subluxation. Skull base and vertebrae: No acute fracture. Soft tissues and spinal canal: No prevertebral fluid or swelling. No visible canal hematoma. Disc levels:  Degenerative changes. Upper chest: There is pulmonary vascular prominence with cephalization, likely vascular congestion. Clinical correlation is recommended. Other: Bilateral carotid bulb calcified plaques. IMPRESSION: 1. No acute intracranial pathology. Mild age-related atrophy and chronic microvascular ischemic changes. 2. No acute/traumatic cervical spine pathology. Electronically Signed   By: Anner Crete M.D.   On: 02/05/2020 18:39   DG Chest Portable 1 View  Result Date: 02/05/2020 CLINICAL DATA:  64 year old female with shortness of breath. EXAM: PORTABLE CHEST 1 VIEW COMPARISON:  None. FINDINGS: Minimal bibasilar atelectasis. No focal consolidation, pleural effusion, pneumothorax. The cardiac silhouette is within limits. Atherosclerotic calcification of the aorta. No acute osseous pathology. IMPRESSION: No active disease. Electronically Signed   By: Anner Crete M.D.   On: 02/05/2020 20:21    Procedures .Critical Care Performed by: Domenic Moras, PA-C Authorized by: Domenic Moras, PA-C   Critical care provider statement:    Critical care time  (minutes):  47   Critical care was time spent personally by me on the following activities:  Discussions with consultants, evaluation of patient's response to treatment, examination of patient, ordering and performing treatments and interventions, ordering and review of laboratory studies, ordering and review of radiographic studies, pulse oximetry, re-evaluation of patient's condition, obtaining history from patient or surrogate and review of old charts   (including critical care time)  Medications Ordered in ED Medications  heparin bolus via infusion 5,400 Units (has no administration in time range)  heparin ADULT infusion 100 units/mL (25000 units/26mL sodium chloride 0.45%) (has no administration in time range)  sodium chloride 0.9 % bolus 1,000 mL (1,000 mLs Intravenous New Bag/Given 02/05/20 2110)  iohexol (OMNIPAQUE) 350 MG/ML injection 100 mL (68 mLs Intravenous Contrast Given 02/05/20 2056)    ED Course  I have reviewed the triage vital signs and the nursing notes.  Pertinent labs & imaging results that were available during my care of the patient were reviewed by me and considered in my medical decision making (see chart for details).    MDM Rules/Calculators/A&P                          BP 118/88 (BP Location: Right Arm)   Pulse (!) 111   Temp (!) 97.4 F (36.3 C) (Oral)   Resp 13   Ht 5\' 6"  (1.676 m)   Wt 103.4 kg   SpO2 98%   BMI 36.80 kg/m   Final Clinical Impression(s) / ED Diagnoses Final diagnoses:  Acute pulmonary embolism without acute cor pulmonale, unspecified pulmonary embolism type ([place])  New onset atrial fibrillation ([place])  Hypoxia    Rx / DC Orders ED Discharge Orders    None     5:35 PM Patient report having weakness, neck pain, and generalized fatigue ongoing for nearly a week.  No headache.  Had a syncopal episode today, unwitnessed.  Patient was found to be in atrial fibrillation.  No known history of A. fib.  Remote history of PE not on any  blood thinner medication.  At this time she denies any significant pain.  Work-up initiated.  7:48 PM Labs remarkable for an elevated BUN of 38, creatinine 1.71 with a GFR of 33.  BNP is elevated at 341.  Covid test is negative, initial troponin unremarkable, D-dimer is elevated at 1.26.  Patient does have a documented O2 sats of 74% on room air, will place on supplemental oxygen.  Scan of the cervical spine and head CT scan unremarkable.  I did discuss with radiologist and patient can undergo chest CT angiogram to rule out PE.  Care discussed with Dr. Karle Starch.  10:16 PM Chest CT angiogram demonstrates what appears to be chronic PE however there additional filling defect in the segmental and subsegmental branch of both lower lobes suggesting new PE.  Given her hypoxia, new onset A. fib, as well as signs of heart failure, I have initiated heparin and will consult hospital for admission.  Patient made aware of symptoms and appreciate as well as agree with plan.  10:35 PM Appreciate consultation from [place] DR. Hal Hope who agrees to see and will admit pt for further care.    Domenic Moras, PA-C 02/05/20 2237    Truddie Hidden, MD 02/05/20 [phone]

## 2020-02-05 NOTE — ED Notes (Signed)
Patient ambulating with stand by assistance to bathroom, ambulatory pulse ox 97-100% on room air.

## 2020-02-05 NOTE — ED Notes (Signed)
Pt to CT via stretcher in stable condition.

## 2020-02-05 NOTE — ED Notes (Signed)
Pt stated she is very dizzy and can not sit up on the edge of the bed.

## 2020-02-06 ENCOUNTER — Observation Stay (HOSPITAL_BASED_OUTPATIENT_CLINIC_OR_DEPARTMENT_OTHER): Payer: Medicare Other

## 2020-02-06 ENCOUNTER — Encounter (HOSPITAL_COMMUNITY): Payer: Self-pay | Admitting: Internal Medicine

## 2020-02-06 ENCOUNTER — Other Ambulatory Visit: Payer: Self-pay

## 2020-02-06 ENCOUNTER — Observation Stay (HOSPITAL_COMMUNITY): Payer: Medicare Other

## 2020-02-06 DIAGNOSIS — I4581 Long QT syndrome: Secondary | ICD-10-CM | POA: Diagnosis present

## 2020-02-06 DIAGNOSIS — Z885 Allergy status to narcotic agent status: Secondary | ICD-10-CM | POA: Diagnosis not present

## 2020-02-06 DIAGNOSIS — I4891 Unspecified atrial fibrillation: Secondary | ICD-10-CM | POA: Diagnosis present

## 2020-02-06 DIAGNOSIS — I2602 Saddle embolus of pulmonary artery with acute cor pulmonale: Secondary | ICD-10-CM | POA: Diagnosis not present

## 2020-02-06 DIAGNOSIS — M109 Gout, unspecified: Secondary | ICD-10-CM | POA: Diagnosis present

## 2020-02-06 DIAGNOSIS — E669 Obesity, unspecified: Secondary | ICD-10-CM | POA: Diagnosis present

## 2020-02-06 DIAGNOSIS — N179 Acute kidney failure, unspecified: Secondary | ICD-10-CM | POA: Diagnosis present

## 2020-02-06 DIAGNOSIS — I080 Rheumatic disorders of both mitral and aortic valves: Secondary | ICD-10-CM | POA: Diagnosis present

## 2020-02-06 DIAGNOSIS — I2782 Chronic pulmonary embolism: Secondary | ICD-10-CM

## 2020-02-06 DIAGNOSIS — N1831 Chronic kidney disease, stage 3a: Secondary | ICD-10-CM | POA: Diagnosis present

## 2020-02-06 DIAGNOSIS — I447 Left bundle-branch block, unspecified: Secondary | ICD-10-CM | POA: Diagnosis present

## 2020-02-06 DIAGNOSIS — I2699 Other pulmonary embolism without acute cor pulmonale: Secondary | ICD-10-CM

## 2020-02-06 DIAGNOSIS — R55 Syncope and collapse: Secondary | ICD-10-CM | POA: Diagnosis present

## 2020-02-06 DIAGNOSIS — M542 Cervicalgia: Secondary | ICD-10-CM | POA: Diagnosis present

## 2020-02-06 DIAGNOSIS — Z79890 Hormone replacement therapy (postmenopausal): Secondary | ICD-10-CM | POA: Diagnosis not present

## 2020-02-06 DIAGNOSIS — I2584 Coronary atherosclerosis due to calcified coronary lesion: Secondary | ICD-10-CM | POA: Diagnosis present

## 2020-02-06 DIAGNOSIS — E039 Hypothyroidism, unspecified: Secondary | ICD-10-CM | POA: Diagnosis present

## 2020-02-06 DIAGNOSIS — Z7982 Long term (current) use of aspirin: Secondary | ICD-10-CM | POA: Diagnosis not present

## 2020-02-06 DIAGNOSIS — R011 Cardiac murmur, unspecified: Secondary | ICD-10-CM | POA: Diagnosis present

## 2020-02-06 DIAGNOSIS — Z20822 Contact with and (suspected) exposure to covid-19: Secondary | ICD-10-CM | POA: Diagnosis present

## 2020-02-06 DIAGNOSIS — I2510 Atherosclerotic heart disease of native coronary artery without angina pectoris: Secondary | ICD-10-CM | POA: Diagnosis present

## 2020-02-06 DIAGNOSIS — Z6836 Body mass index (BMI) 36.0-36.9, adult: Secondary | ICD-10-CM | POA: Diagnosis not present

## 2020-02-06 DIAGNOSIS — Z79899 Other long term (current) drug therapy: Secondary | ICD-10-CM | POA: Diagnosis not present

## 2020-02-06 DIAGNOSIS — W19XXXA Unspecified fall, initial encounter: Secondary | ICD-10-CM | POA: Diagnosis present

## 2020-02-06 DIAGNOSIS — R0902 Hypoxemia: Secondary | ICD-10-CM | POA: Diagnosis present

## 2020-02-06 DIAGNOSIS — R531 Weakness: Secondary | ICD-10-CM | POA: Diagnosis present

## 2020-02-06 LAB — ECHOCARDIOGRAM COMPLETE
AR max vel: 1.58 cm2
AV Area VTI: 1.29 cm2
AV Area mean vel: 1.27 cm2
AV Mean grad: 12.3 mmHg
AV Peak grad: 20.3 mmHg
Ao pk vel: 2.25 m/s
Area-P 1/2: 3.29 cm2
Calc EF: 47.2 %
Height: 66 in
MV M vel: 5.2 m/s
MV Peak grad: 108.2 mmHg
MV VTI: 1.86 cm2
S' Lateral: 3.9 cm
Single Plane A2C EF: 39.7 %
Single Plane A4C EF: 52 %
Weight: 3648 oz

## 2020-02-06 LAB — BASIC METABOLIC PANEL
Anion gap: 12 (ref 5–15)
BUN: 38 mg/dL — ABNORMAL HIGH (ref 8–23)
CO2: 28 mmol/L (ref 22–32)
Calcium: 9.1 mg/dL (ref 8.9–10.3)
Chloride: 101 mmol/L (ref 98–111)
Creatinine, Ser: 1.64 mg/dL — ABNORMAL HIGH (ref 0.44–1.00)
GFR, Estimated: 34 mL/min — ABNORMAL LOW (ref >60)
Glucose, Bld: 102 mg/dL — ABNORMAL HIGH (ref 70–99)
Potassium: 4.4 mmol/L (ref 3.5–5.1)
Sodium: 141 mmol/L (ref 135–145)

## 2020-02-06 LAB — CBC
HCT: 41.3 % (ref 36.0–46.0)
Hemoglobin: 13.3 g/dL (ref 12.0–15.0)
MCH: 29.2 pg (ref 26.0–34.0)
MCHC: 32.2 g/dL (ref 30.0–36.0)
MCV: 90.8 fL (ref 80.0–100.0)
Platelets: 156 10*3/uL (ref 150–400)
RBC: 4.55 MIL/uL (ref 3.87–5.11)
RDW: 13.8 % (ref 11.5–15.5)
WBC: 7.6 10*3/uL (ref 4.0–10.5)
nRBC: 0 % (ref 0.0–0.2)

## 2020-02-06 LAB — HEPARIN LEVEL (UNFRACTIONATED)
Heparin Unfractionated: 0.71 IU/mL — ABNORMAL HIGH (ref 0.30–0.70)
Heparin Unfractionated: 0.6 IU/mL (ref 0.30–0.70)

## 2020-02-06 LAB — HIV ANTIBODY (ROUTINE TESTING W REFLEX): HIV Screen 4th Generation wRfx: NONREACTIVE

## 2020-02-06 LAB — TSH: TSH: 3.024 u[IU]/mL (ref 0.350–4.500)

## 2020-02-06 MED ORDER — ACETAMINOPHEN 650 MG RE SUPP: 650.0000 mg | Freq: Four times a day (QID) | RECTAL | Status: DC | PRN

## 2020-02-06 MED ORDER — LEVOTHYROXINE SODIUM 75 MCG PO TABS: 75.0000 ug | ORAL_TABLET | ORAL | Status: DC

## 2020-02-06 MED ORDER — LEVOTHYROXINE SODIUM 50 MCG PO TABS
50.0000 ug | ORAL_TABLET | Freq: Every day | ORAL | Status: DC
  Administered 2020-02-06: 50 ug via ORAL
  Filled 2020-02-06: qty 1

## 2020-02-06 MED ORDER — APIXABAN 5 MG PO TABS
5.0000 mg | ORAL_TABLET | Freq: Two times a day (BID) | ORAL | Status: DC
  Administered 2020-02-06 – 2020-02-07 (×2): 5 mg via ORAL
  Filled 2020-02-06 (×2): qty 1

## 2020-02-06 MED ORDER — DILTIAZEM HCL 30 MG PO TABS
30.0000 mg | ORAL_TABLET | Freq: Three times a day (TID) | ORAL | Status: DC
  Administered 2020-02-06: 30 mg via ORAL
  Filled 2020-02-06: qty 1

## 2020-02-06 MED ORDER — DILTIAZEM HCL ER COATED BEADS 120 MG PO CP24
120.0000 mg | ORAL_CAPSULE | Freq: Every day | ORAL | Status: DC
  Administered 2020-02-06: 120 mg via ORAL
  Filled 2020-02-06: qty 1

## 2020-02-06 MED ORDER — ACETAMINOPHEN 325 MG PO TABS
650.0000 mg | ORAL_TABLET | Freq: Four times a day (QID) | ORAL | Status: DC | PRN
  Administered 2020-02-07: 650 mg via ORAL
  Filled 2020-02-06: qty 2

## 2020-02-06 MED ORDER — LEVOTHYROXINE SODIUM 50 MCG PO TABS
50.0000 ug | ORAL_TABLET | ORAL | Status: DC
  Administered 2020-02-07: 50 ug via ORAL

## 2020-02-06 MED ORDER — METOPROLOL SUCCINATE ER 25 MG PO TB24
25.0000 mg | ORAL_TABLET | Freq: Every day | ORAL | Status: DC
  Administered 2020-02-06 – 2020-02-07 (×2): 25 mg via ORAL
  Filled 2020-02-06 (×2): qty 1

## 2020-02-06 NOTE — Consult Note (Signed)
Referring Physician: Dr. Broadus John, Jacinta Shoe  Cynthia Cook is an 64 y.o. female.                       Chief Complaint: Syncope and atrial fibrillation  HPI: 64 years old white female with PMH of gout, hypothyroidism, remote PE had shortness of breath in July. She also has MS and AS. Post syncope ER evaluation found atrial fibrillation with moderate ventricular response. Her levothyroxine dose was recently increased to 75 mcg. 3 days and 50 mcg other 4 days of the week. Her TSH is normal today.  She has EKG evidence of atrial fibrillation and non-specific ST-T changes. Echocardiogram shows moderate MS with MR and mild to moderate AS. CT angio chest is positive for prior PE with web like structure. [place] of legs is negative for DVT. She denies chest pain. She claims to have lost 60 pounds of weight 2 years ago and regained 30 pounds of weight in last 1-2 years.  Past Medical History:  Diagnosis Date  . Back pain   . Murmur   . PE (pulmonary embolism)   . Thyroid disease       History reviewed. No pertinent surgical history.  Family History  Family history unknown: Yes   Social History:  reports that she has never smoked. She has never used smokeless tobacco. She reports that she does not drink alcohol. No history on file for drug use.  Allergies:  Allergies  Allergen Reactions  . Codeine Nausea Only    Medications Prior to Admission  Medication Sig Dispense Refill  . aspirin EC 325 MG tablet Take 325 mg by mouth daily as needed for moderate pain.    . B Complex-C (B-COMPLEX WITH VITAMIN C) tablet Take 1 tablet by mouth daily.    Marland Kitchen co-enzyme Q-10 30 MG capsule Take 30 mg by mouth daily.    . furosemide (LASIX) 20 MG tablet Take 20 mg by mouth as directed. Take 1 tablet 5 Days A Week    . levothyroxine (SYNTHROID) 50 MCG tablet Take 50-75 mcg by mouth daily. Take 75 mcg (1.5 tablets) on MWF and Take 50 mcg (1 tablet) all other days    . Menaquinone-7 (VITAMIN K2) 100 MCG CAPS Take by  mouth.    . Multiple Vitamins-Minerals (MULTIVITAMIN WITH MINERALS) tablet Take 1 tablet by mouth daily.    . Selenium 100 MCG CAPS Take by mouth.    . vitamin C (ASCORBIC ACID) 250 MG tablet Take 500 mg by mouth daily.     . vitamin E 400 UNIT capsule Take 400 Units by mouth daily.    . Zinc 30 MG TABS Take by mouth.    . ezetimibe (ZETIA) 10 MG tablet Take 1 tablet (10 mg total) by mouth daily. (Patient not taking: Reported on 02/05/2020) 360 tablet 0  . febuxostat (ULORIC) 40 MG tablet Take 40 mg by mouth daily.    . metoprolol tartrate (LOPRESSOR) 50 MG tablet Take 50 mg (1 tablet) TWO hours prior to CT (Patient not taking: Reported on 02/05/2020) 1 tablet 0    Results for orders placed or performed during the hospital encounter of 02/05/20 (from the past 48 hour(s))  Basic metabolic panel     Status: Abnormal   Collection Time: 02/05/20  6:04 PM  Result Value Ref Range   Sodium 140 135 - 145 mmol/L   Potassium 4.6 3.5 - 5.1 mmol/L   Chloride 100 98 - 111 mmol/L  CO2 26 22 - 32 mmol/L   Glucose, Bld 154 (H) 70 - 99 mg/dL    Comment: Glucose reference range applies only to samples taken after fasting for at least 8 hours.   BUN 38 (H) 8 - 23 mg/dL   Creatinine, Ser 1.71 (H) 0.44 - 1.00 mg/dL   Calcium 9.3 8.9 - 10.3 mg/dL   GFR, Estimated 33 (L) >60 mL/min    Comment: (NOTE) Calculated using the CKD-EPI Creatinine Equation (2021)    Anion gap 14 5 - 15    Comment: Performed at [facility] [address] [place] [address]  CBC     Status: None   Collection Time: 02/05/20  6:04 PM  Result Value Ref Range   WBC 9.9 4.0 - 10.5 K/uL   RBC 4.87 3.87 - 5.11 MIL/uL   Hemoglobin 14.2 12.0 - 15.0 g/dL   HCT 44.6 36 - 46 %   MCV 91.6 80.0 - 100.0 fL   MCH 29.2 26.0 - 34.0 pg   MCHC 31.8 30.0 - 36.0 g/dL   RDW 13.7 11.5 - 15.5 %   Platelets 161 150 - 400 K/uL   nRBC 0.0 0.0 - 0.2 %    Comment: Performed at [facility], [place] [address], [place], [place]  [address]  Troponin I (High Sensitivity)     Status: None   Collection Time: 02/05/20  6:04 PM  Result Value Ref Range   Troponin I (High Sensitivity) 17 <18 ng/L    Comment: (NOTE) Elevated high sensitivity troponin I (hsTnI) values and significant  changes across serial measurements may suggest ACS but many other  chronic and acute conditions are known to elevate hsTnI results.  Refer to the "Links" section for chest pain algorithms and additional  guidance. Performed at [facility], [place] [address] [place],  [address]   Respiratory Panel by RT PCR (Flu A&B, Covid) - Nasopharyngeal Swab     Status: None   Collection Time: 02/05/20  6:04 PM   Specimen: Nasopharyngeal Swab  Result Value Ref Range   SARS Coronavirus 2 by RT PCR NEGATIVE NEGATIVE    Comment: (NOTE) SARS-CoV-2 target nucleic acids are NOT DETECTED.  The SARS-CoV-2 RNA is generally detectable in upper respiratoy specimens during the acute phase of infection. The lowest concentration of SARS-CoV-2 viral copies this assay can detect is 131 copies/mL. A negative result does not preclude SARS-Cov-2 infection and should not be used as the sole basis for treatment or other patient management decisions. A negative result may occur with  improper specimen collection/handling, submission of specimen other than nasopharyngeal swab, presence of viral mutation(s) within the areas targeted by this assay, and inadequate number of viral copies (<131 copies/mL). A negative result must be combined with clinical observations, patient history, and epidemiological information. The expected result is Negative.  Fact Sheet for Patients:  PinkCheek.be  Fact Sheet for Healthcare Providers:  GravelBags.it  This test is no t yet approved or cleared by the [place] FDA and  has been authorized for detection and/or diagnosis of SARS-CoV-2 by FDA under an Emergency Use  Authorization (EUA). This EUA will remain  in effect (meaning this test can be used) for the duration of the COVID-19 declaration under Section 564(b)(1) of the Act, 21 U.S.C. section 360bbb-3(b)(1), unless the authorization is terminated or revoked sooner.     Influenza A by PCR NEGATIVE NEGATIVE   Influenza B by PCR NEGATIVE NEGATIVE  Comment: (NOTE) The Xpert Xpress SARS-CoV-2/FLU/RSV assay is intended as an aid in  the diagnosis of influenza from Nasopharyngeal swab specimens and  should not be used as a sole basis for treatment. Nasal washings and  aspirates are unacceptable for Xpert Xpress SARS-CoV-2/FLU/RSV  testing.  Fact Sheet for Patients: PinkCheek.be  Fact Sheet for Healthcare Providers: GravelBags.it  This test is not yet approved or cleared by the [place] FDA and  has been authorized for detection and/or diagnosis of SARS-CoV-2 by  FDA under an Emergency Use Authorization (EUA). This EUA will remain  in effect (meaning this test can be used) for the duration of the  Covid-19 declaration under Section 564(b)(1) of the Act, 21  U.S.C. section 360bbb-3(b)(1), unless the authorization is  terminated or revoked. Performed at [facility], [place] [address], [place], [place] [address]   D-dimer, quantitative (not at [facility])     Status: Abnormal   Collection Time: 02/05/20  6:04 PM  Result Value Ref Range   D-Dimer, Quant 1.26 (H) 0.00 - 0.50 ug/mL-FEU    Comment: (NOTE) At the manufacturer cut-off value of 0.5 g/mL FEU, this assay has a negative predictive value of 95-100%.This assay is intended for use in conjunction with a clinical pretest probability (PTP) assessment model to exclude pulmonary embolism (PE) and deep venous thrombosis (DVT) in outpatients suspected of PE or DVT. Results should be correlated with clinical presentation. Performed at [facility], [place] [address]  [place], [place] [address]   Brain natriuretic peptide     Status: Abnormal   Collection Time: 02/05/20  6:11 PM  Result Value Ref Range   B Natriuretic Peptide 341.7 (H) 0.0 - 100.0 pg/mL    Comment: Performed at [facility] [address] [place], [place] [address]  CBG monitoring, ED     Status: Abnormal   Collection Time: 02/05/20  6:33 PM  Result Value Ref Range   Glucose-Capillary 140 (H) 70 - 99 mg/dL    Comment: Glucose reference range applies only to samples taken after fasting for at least 8 hours.   Comment 1 Notify RN    Comment 2 Document in Chart   Urinalysis, Routine w reflex microscopic     Status: Abnormal   Collection Time: 02/05/20  8:06 PM  Result Value Ref Range   Color, Urine AMBER (A) YELLOW    Comment: BIOCHEMICALS MAY BE AFFECTED BY COLOR   APPearance CLOUDY (A) CLEAR   Specific Gravity, Urine 1.023 1.005 - 1.030   pH 5.0 5.0 - 8.0   Glucose, UA NEGATIVE NEGATIVE mg/dL   Hgb urine dipstick NEGATIVE NEGATIVE   Bilirubin Urine NEGATIVE NEGATIVE   Ketones, ur 20 (A) NEGATIVE mg/dL   Protein, ur 30 (A) NEGATIVE mg/dL   Nitrite NEGATIVE NEGATIVE   Leukocytes,Ua NEGATIVE NEGATIVE   RBC / HPF 0-5 0 - 5 RBC/hpf   WBC, UA 0-5 0 - 5 WBC/hpf   Bacteria, UA RARE (A) NONE SEEN   Squamous Epithelial / LPF 0-5 0 - 5   Mucus PRESENT     Comment: Performed at [facility], [address], [place], [place] [address]  Troponin I (High Sensitivity)     Status: Abnormal   Collection Time: 02/05/20  8:06 PM  Result Value Ref Range   Troponin I (High Sensitivity) 24 (H) <18 ng/L    Comment: (NOTE) Elevated high sensitivity troponin I (hsTnI) values and significant  changes across serial measurements may  suggest ACS but many other  chronic and acute conditions are known to elevate hsTnI results.  Refer to the "Links" section for chest pain algorithms and additional  guidance. Performed at [facility], [place] [address] [place], [place] [address]    Heparin level (unfractionated)     Status: Abnormal   Collection Time: 02/06/20  2:49 AM  Result Value Ref Range   Heparin Unfractionated 0.71 (H) 0.30 - 0.70 IU/mL    Comment: (NOTE) If heparin results are below expected values, and patient dosage has  been confirmed, suggest follow up testing of antithrombin III levels. Performed at [facility], [place] [address], [place], [place] [address]   HIV Antibody (routine testing w rflx)     Status: None   Collection Time: 02/06/20  2:49 AM  Result Value Ref Range   HIV Screen 4th Generation wRfx Non Reactive Non Reactive    Comment: Performed at [facility], [place] [address] [name], [place] [address]  TSH     Status: None   Collection Time: 02/06/20  2:49 AM  Result Value Ref Range   TSH 3.024 0.350 - 4.500 uIU/mL    Comment: Performed by a 3rd Generation assay with a functional sensitivity of <=0.01 uIU/mL. Performed at [facility], [place] [address] [place], [place] [address]   Basic metabolic panel     Status: Abnormal   Collection Time: 02/06/20  2:49 AM  Result Value Ref Range   Sodium 141 135 - 145 mmol/L   Potassium 4.4 3.5 - 5.1 mmol/L   Chloride 101 98 - 111 mmol/L   CO2 28 22 - 32 mmol/L   Glucose, Bld 102 (H) 70 - 99 mg/dL    Comment: Glucose reference range applies only to samples taken after fasting for at least 8 hours.   BUN 38 (H) 8 - 23 mg/dL   Creatinine, Ser 1.64 (H) 0.44 - 1.00 mg/dL   Calcium 9.1 8.9 - 10.3 mg/dL   GFR, Estimated 34 (L) >60 mL/min    Comment: (NOTE) Calculated using the CKD-EPI Creatinine Equation (2021)    Anion gap 12 5 - 15    Comment: Performed at [facility] [address] [place] [address]  CBC     Status: None   Collection Time: 02/06/20  2:49 AM  Result Value Ref Range   WBC 7.6 4.0 - 10.5 K/uL   RBC 4.55 3.87 - 5.11 MIL/uL   Hemoglobin 13.3 12.0 - 15.0 g/dL   HCT 41.3 36 - 46 %   MCV 90.8 80.0 - 100.0 fL   MCH 29.2 26.0 - 34.0 pg   MCHC 32.2  30.0 - 36.0 g/dL   RDW 13.8 11.5 - 15.5 %   Platelets 156 150 - 400 K/uL   nRBC 0.0 0.0 - 0.2 %    Comment: Performed at [facility], [place] [address] [place], [place] [address]  Heparin level (unfractionated)     Status: None   Collection Time: 02/06/20  8:16 AM  Result Value Ref Range   Heparin Unfractionated 0.60 0.30 - 0.70 IU/mL    Comment: (NOTE) If heparin results are below expected values, and patient dosage has  been confirmed, suggest follow up testing of antithrombin III levels. Performed at [facility], [place] [address] [place], [place] [address]    CT HEAD WO CONTRAST  Result Date: 02/05/2020 CLINICAL DATA:  64 year old female with trauma. EXAM:  CT HEAD WITHOUT CONTRAST CT CERVICAL SPINE WITHOUT CONTRAST TECHNIQUE: Multidetector CT imaging of the head and cervical spine was performed following the standard protocol without intravenous contrast. Multiplanar CT image reconstructions of the cervical spine were also generated. COMPARISON:  None. FINDINGS: CT HEAD FINDINGS Brain: Mild age-related atrophy and chronic microvascular ischemic changes. There is no acute intracranial hemorrhage. No mass effect or midline shift. No extra-axial fluid collection. Vascular: No hyperdense vessel or unexpected calcification. Skull: Normal. Negative for fracture or focal lesion. Sinuses/Orbits: No acute finding. Other: None CT CERVICAL SPINE FINDINGS Alignment: No acute subluxation. Skull base and vertebrae: No acute fracture. Soft tissues and spinal canal: No prevertebral fluid or swelling. No visible canal hematoma. Disc levels:  Degenerative changes. Upper chest: There is pulmonary vascular prominence with cephalization, likely vascular congestion. Clinical correlation is recommended. Other: Bilateral carotid bulb calcified plaques. IMPRESSION: 1. No acute intracranial pathology. Mild age-related atrophy and chronic microvascular ischemic changes. 2. No acute/traumatic cervical spine  pathology. Electronically Signed   By: Anner Crete M.D.   On: 02/05/2020 18:39   CT Angio Chest PE W and/or Wo Contrast  Result Date: 02/05/2020 CLINICAL DATA:  Positive D-dimer, hypoxia, syncope, history of prior PE EXAM: CT ANGIOGRAPHY CHEST WITH CONTRAST TECHNIQUE: Multidetector CT imaging of the chest was performed using the standard protocol during bolus administration of intravenous contrast. Multiplanar CT image reconstructions and MIPs were obtained to evaluate the vascular anatomy. CONTRAST:  67mL OMNIPAQUE IOHEXOL 350 MG/ML SOLN COMPARISON:  Radiograph 02/05/2020 FINDINGS: Cardiovascular: While there is satisfactory central pulmonary arterial opacification. Contrast opacification of pulmonary arteries is significantly diminished beyond the central and lobar segments. However, there are several areas which are concerning for pulmonary artery emboli. The first being some thin linear almost web-like filling defects present in the right intralobar pulmonary artery which could suggest some chronic embolic disease albeit with more expanded and unopacified segmental branches in the posteromedial basal segments of the right lower lobe as well as several segmental branches in the left lower lobe as well. Central pulmonary arteries are top-normal caliber. RV/LV ratio is maintained. Cardiac size is top normal. Three-vessel coronary artery calcifications are present. Dense calcification is present in the mitral annulus. Additional calcifications are present upon the aortic leaflets. Atherosclerotic plaque within the normal caliber aorta. Suboptimal opacification for luminal evaluation of the aorta. No gross aortic abnormality is seen. Normal 3 vessel branching of the aortic arch. Proximal great vessels are calcified and slightly tortuous but otherwise unremarkable. No major venous abnormalities are seen. Mediastinum/Nodes: No mediastinal fluid or gas. Normal thyroid gland and thoracic inlet. No acute  abnormality of the trachea. Small hiatal hernia without other acute esophageal abnormality. No worrisome mediastinal, hilar or axillary adenopathy. Lungs/Pleura: Lung volumes are low with dependent atelectatic change and additional bandlike areas of opacity in both lower lobes and the right middle lobe likely reflecting further subsegmental atelectasis or scarring. No pneumothorax or pleural effusion. Some mild mosaic attenuation may reflect air trapping or atelectatic change accentuated by imaging during exhalation. 7 mm juxta phrenic nodule seen in the right lower lobe (6/84). No other concerning pulmonary nodules or masses. Upper Abdomen: No acute abnormalities present in the visualized portions of the upper abdomen. Musculoskeletal: Multilevel degenerative changes are present in the imaged portions of the spine. Additional degenerative changes in the shoulders. No worrisome chest wall lesions. Review of the MIP images confirms the above findings. IMPRESSION: 1. Excellent opacification of the central lobar pulmonary arteries however there is mixing artifact within  the more distal segmental and subsegmental branches. Web-like filling defects are seen in the right intralobar pulmonary artery which could suggest some chronic pulmonary embolus in this patient history of prior embolic disease. Suspect additional filling defects within segmental and subsegmental branches of both lower lobes though these may be accentuated by mixing artifact. No convincing CT evidence of right heart strain at this time. 2. Cardiomegaly. Extensive calcifications upon the mitral annulus and to a lesser extent the aortic leaflets may warrant further evaluation with echocardiography to assess for underlying valvular dysfunction. Three-vessel coronary artery calcifications. 3. Low lung volumes and basilar atelectatic changes with some more mosaic attenuation likely reflecting further atelectasis or air trapping. 4. 7 mm juxta phrenic nodule  in the right lower lobe. Non-contrast chest CT at 6-12 months is recommended. If the nodule is stable at time of repeat CT, then future CT at 18-24 months (from today's scan) is considered optional for low-risk patients, but is recommended for high-risk patients. This recommendation follows the consensus statement: Guidelines for Management of Incidental Pulmonary Nodules Detected on CT Images: From the Fleischner Society 2017; Radiology 2017; 284:228-243. 5. Aortic Atherosclerosis (ICD10-I70.0). Critical Value/emergent results were called by telephone at the time of interpretation on 02/05/2020 at 9:12 pm to provider [facility] , who verbally acknowledged these results. Electronically Signed   By: Lovena Le M.D.   On: 02/05/2020 21:13   CT Cervical Spine Wo Contrast  Result Date: 02/05/2020 CLINICAL DATA:  64 year old female with trauma. EXAM: CT HEAD WITHOUT CONTRAST CT CERVICAL SPINE WITHOUT CONTRAST TECHNIQUE: Multidetector CT imaging of the head and cervical spine was performed following the standard protocol without intravenous contrast. Multiplanar CT image reconstructions of the cervical spine were also generated. COMPARISON:  None. FINDINGS: CT HEAD FINDINGS Brain: Mild age-related atrophy and chronic microvascular ischemic changes. There is no acute intracranial hemorrhage. No mass effect or midline shift. No extra-axial fluid collection. Vascular: No hyperdense vessel or unexpected calcification. Skull: Normal. Negative for fracture or focal lesion. Sinuses/Orbits: No acute finding. Other: None CT CERVICAL SPINE FINDINGS Alignment: No acute subluxation. Skull base and vertebrae: No acute fracture. Soft tissues and spinal canal: No prevertebral fluid or swelling. No visible canal hematoma. Disc levels:  Degenerative changes. Upper chest: There is pulmonary vascular prominence with cephalization, likely vascular congestion. Clinical correlation is recommended. Other: Bilateral carotid bulb calcified  plaques. IMPRESSION: 1. No acute intracranial pathology. Mild age-related atrophy and chronic microvascular ischemic changes. 2. No acute/traumatic cervical spine pathology. Electronically Signed   By: Anner Crete M.D.   On: 02/05/2020 18:39   DG Chest Portable 1 View  Result Date: 02/05/2020 CLINICAL DATA:  64 year old female with shortness of breath. EXAM: PORTABLE CHEST 1 VIEW COMPARISON:  None. FINDINGS: Minimal bibasilar atelectasis. No focal consolidation, pleural effusion, pneumothorax. The cardiac silhouette is within limits. Atherosclerotic calcification of the aorta. No acute osseous pathology. IMPRESSION: No active disease. Electronically Signed   By: Anner Crete M.D.   On: 02/05/2020 20:21   ECHOCARDIOGRAM COMPLETE  Result Date: 02/06/2020    ECHOCARDIOGRAM REPORT   Patient Name:   Cynthia Cook Date of Exam: 02/06/2020 Medical Rec #:  [id number]      Height:       66.0 in Accession #:    [id number]     Weight:       228.0 lb Date of Birth:  1955/11/15       BSA:          2.114 m Patient Age:  64 years       BP:           106/70 mmHg Patient Gender: F              HR:           120 bpm. Exam Location:  Inpatient Procedure: 2D Echo, 3D Echo, Cardiac Doppler and Color Doppler Indications:    I26.02 Pulmonary embolus  History:        Patient has prior history of Echocardiogram examinations, most                 recent 05/02/2018. Abnormal ECG, Aortic Valve Disease;                 Arrythmias:Atrial Fibrillation.  Sonographer:    Roseanna Rainbow RDCS Referring Phys: Elim  Sonographer Comments: Technically difficult study due to poor echo windows. Image acquisition challenging due to patient body habitus. IMPRESSIONS  1. Left ventricular ejection fraction, by estimation, is 55 to 60%. The left ventricle has normal function. The left ventricle has no regional wall motion abnormalities. There is mild concentric left ventricular hypertrophy. Left ventricular diastolic function  could not be evaluated.  2. Moderate calcific mitral stenosis is present. MG 5.6 mmHG @ 109 bpm. MVA by VTI 1.86 cm2. Mild to moderate MR is present which is related to restricted movement of the PMVL (IIIB). The mitral valve is degenerative. Mild to moderate mitral valve regurgitation. Moderate mitral stenosis. Severe mitral annular calcification.  3. Mild to moderate AS is present. V max 2.25 m/s, MG 12.3 mmHG, AVA 1.29 cm2, DI 0.34. Gradients lower than expected due to low SVI (SV=56 cc, SVI=26 cc/m2). The aortic valve is tricuspid. There is moderate calcification of the aortic valve. There is moderate thickening of the aortic valve. Aortic valve regurgitation is not visualized. Mild to moderate aortic valve stenosis. Aortic valve area, by VTI measures 1.29 cm. Aortic valve mean gradient measures 12.3 mmHg. Aortic valve Vmax measures 2.25 m/s.  4. Right ventricular systolic function is normal. The right ventricular size is mildly enlarged. There is mildly elevated pulmonary artery systolic pressure. The estimated right ventricular systolic pressure is 47.4 mmHg.  5. Left atrial size was severely dilated.  6. The inferior vena cava is dilated in size with <50% respiratory variability, suggesting right atrial pressure of 15 mmHg. Comparison(s): Changes from prior study are noted. Afib with RVR is now present. Valvular heart disease is stable. FINDINGS  Left Ventricle: Left ventricular ejection fraction, by estimation, is 55 to 60%. The left ventricle has normal function. The left ventricle has no regional wall motion abnormalities. The left ventricular internal cavity size was normal in size. There is  mild concentric left ventricular hypertrophy. Left ventricular diastolic function could not be evaluated due to atrial fibrillation. Left ventricular diastolic function could not be evaluated. Right Ventricle: The right ventricular size is mildly enlarged. No increase in right ventricular wall thickness. Right  ventricular systolic function is normal. There is mildly elevated pulmonary artery systolic pressure. The tricuspid regurgitant velocity is 2.28 m/s, and with an assumed right atrial pressure of 15 mmHg, the estimated right ventricular systolic pressure is 25.9 mmHg. Left Atrium: Left atrial size was severely dilated. Right Atrium: Right atrial size was normal in size. Pericardium: Trivial pericardial effusion is present. Presence of pericardial fat pad. Mitral Valve: Moderate calcific mitral stenosis is present. MG 5.6 mmHG @ 109 bpm. MVA by VTI 1.86 cm2. Mild to moderate MR is  present which is related to restricted movement of the PMVL (IIIB). The mitral valve is degenerative in appearance. Severe mitral annular calcification. Mild to moderate mitral valve regurgitation. Moderate mitral valve stenosis. MV peak gradient, 14.8 mmHg. The mean mitral valve gradient is 5.6 mmHg with average heart rate of 109 bpm. Tricuspid Valve: The tricuspid valve is grossly normal. Tricuspid valve regurgitation is trivial. No evidence of tricuspid stenosis. Aortic Valve: Mild to moderate AS is present. V max 2.25 m/s, MG 12.3 mmHG, AVA 1.29 cm2, DI 0.34. Gradients lower than expected due to low SVI (SV=56 cc, SVI=26 cc/m2). The aortic valve is tricuspid. There is moderate calcification of the aortic valve. There is moderate thickening of the aortic valve. Aortic valve regurgitation is not visualized. Mild to moderate aortic stenosis is present. Aortic valve mean gradient measures 12.3 mmHg. Aortic valve peak gradient measures 20.3 mmHg. Aortic valve area, by VTI measures 1.29 cm. Pulmonic Valve: The pulmonic valve was grossly normal. Pulmonic valve regurgitation is not visualized. No evidence of pulmonic stenosis. Aorta: The aortic root and ascending aorta are structurally normal, with no evidence of dilitation. Venous: The inferior vena cava is dilated in size with less than 50% respiratory variability, suggesting right atrial  pressure of 15 mmHg. IAS/Shunts: The atrial septum is grossly normal.  LEFT VENTRICLE PLAX 2D LVIDd:         4.80 cm LVIDs:         3.90 cm LV PW:         1.70 cm LV IVS:        1.40 cm LVOT diam:     2.20 cm LV SV:         56 LV SV Index:   26 LVOT Area:     3.80 cm  LV Volumes (MOD) LV vol d, MOD A2C: 96.5 ml LV vol d, MOD A4C: 68.9 ml LV vol s, MOD A2C: 58.2 ml LV vol s, MOD A4C: 33.1 ml LV SV MOD A2C:     38.3 ml LV SV MOD A4C:     68.9 ml LV SV MOD BP:      41.1 ml RIGHT VENTRICLE            IVC RV S prime:     6.96 cm/s  IVC diam: 2.50 cm TAPSE (M-mode): 2.0 cm LEFT ATRIUM              Index        RIGHT ATRIUM           Index LA diam:        4.50 cm  2.13 cm/m   RA Area:     19.00 cm LA Vol (A2C):   220.0 ml 104.05 ml/m RA Volume:   61.20 ml  28.95 ml/m LA Vol (A4C):   127.0 ml 60.07 ml/m LA Biplane Vol: 176.0 ml 83.24 ml/m  AORTIC VALVE AV Area (Vmax):    1.58 cm AV Area (Vmean):   1.27 cm AV Area (VTI):     1.29 cm AV Vmax:           225.33 cm/s AV Vmean:          166.667 cm/s AV VTI:            0.435 m AV Peak Grad:      20.3 mmHg AV Mean Grad:      12.3 mmHg LVOT Vmax:         93.80 cm/s LVOT Vmean:  55.900 cm/s LVOT VTI:          0.147 m LVOT/AV VTI ratio: 0.34  AORTA Ao Root diam: 3.50 cm Ao Asc diam:  3.80 cm MITRAL VALVE                TRICUSPID VALVE MV Area (PHT): 3.29 cm     TR Peak grad:   20.8 mmHg MV Area VTI:   1.86 cm     TR Vmax:        228.00 cm/s MV Peak grad:  14.8 mmHg MV Mean grad:  5.6 mmHg     SHUNTS MV Vmax:       1.92 m/s     Systemic VTI:  0.15 m MV Vmean:      98.8 cm/s    Systemic Diam: 2.20 cm MV VTI:        0.30 m MV Decel Time: 231 msec MR Peak grad: 108.2 mmHg MR Mean grad: 71.0 mmHg MR Vmax:      520.00 cm/s MR Vmean:     405.0 cm/s MV E velocity: 165.00 cm/s Eleonore Chiquito MD Electronically signed by Eleonore Chiquito MD Signature Date/Time: 02/06/2020/11:41:22 AM    Final    VAS [place] LOWER EXTREMITY VENOUS (DVT)  Result Date: 02/06/2020  Lower Venous  DVTStudy Indications: Pulmonary embolism.  Anticoagulation: Heparin. Limitations: Poor ultrasound/tissue interface. Comparison Study: No prior studies. Performing Technologist: Darlin Coco  Examination Guidelines: A complete evaluation includes B-mode imaging, spectral Doppler, color Doppler, and power Doppler as needed of all accessible portions of each vessel. Bilateral testing is considered an integral part of a complete examination. Limited examinations for reoccurring indications may be performed as noted. The reflux portion of the exam is performed with the patient in reverse Trendelenburg.  +---------+---------------+---------+-----------+----------+--------------+ RIGHT    CompressibilityPhasicitySpontaneityPropertiesThrombus Aging +---------+---------------+---------+-----------+----------+--------------+ CFV      Full           Yes      Yes                                 +---------+---------------+---------+-----------+----------+--------------+ SFJ      Full                                                        +---------+---------------+---------+-----------+----------+--------------+ FV Prox  Full                                                        +---------+---------------+---------+-----------+----------+--------------+ FV Mid   Full                                                        +---------+---------------+---------+-----------+----------+--------------+ FV DistalFull                                                        +---------+---------------+---------+-----------+----------+--------------+  PFV      Full                                                        +---------+---------------+---------+-----------+----------+--------------+ POP      Full           Yes      Yes                                 +---------+---------------+---------+-----------+----------+--------------+ PTV      Full                                                         +---------+---------------+---------+-----------+----------+--------------+ PERO     Full                                                        +---------+---------------+---------+-----------+----------+--------------+   +---------+---------------+---------+-----------+----------+--------------+ LEFT     CompressibilityPhasicitySpontaneityPropertiesThrombus Aging +---------+---------------+---------+-----------+----------+--------------+ CFV      Full           Yes      Yes                                 +---------+---------------+---------+-----------+----------+--------------+ SFJ      Full                                                        +---------+---------------+---------+-----------+----------+--------------+ FV Prox  Full                                                        +---------+---------------+---------+-----------+----------+--------------+ FV Mid   Full                                                        +---------+---------------+---------+-----------+----------+--------------+ FV DistalFull                                                        +---------+---------------+---------+-----------+----------+--------------+ PFV      Full                                                        +---------+---------------+---------+-----------+----------+--------------+  POP      Full           Yes      Yes                                 +---------+---------------+---------+-----------+----------+--------------+ PTV      Full                                                        +---------+---------------+---------+-----------+----------+--------------+ PERO                                                  Not visualized +---------+---------------+---------+-----------+----------+--------------+     Summary: RIGHT: - There is no evidence of deep vein thrombosis in the lower extremity.  - No  cystic structure found in the popliteal fossa.  LEFT: - There is no evidence of deep vein thrombosis in the lower extremity. However, portions of this examination were limited- see technologist comments above.  - A cystic structure is found in the popliteal fossa.  *See table(s) above for measurements and observations. Electronically signed by Deitra Mayo MD on 02/06/2020 at 1:03:21 PM.    Final     Review Of Systems Constitutional: No fever, chills, Positive weight loss and gain. Eyes: No vision change, wears glasses. No discharge or pain. Ears: No hearing loss, No tinnitus. Respiratory: No asthma, COPD, pneumonias. Positive shortness of breath. No hemoptysis. Cardiovascular: No chest pain, positive palpitation, leg edema. Gastrointestinal: No nausea, vomiting, diarrhea, constipation. No GI bleed. No hepatitis. Genitourinary: No dysuria, hematuria, kidney stone. No incontinance. Neurological: No headache, stroke, seizures.  Psychiatry: No psych facility admission for anxiety, depression, suicide. No detox. Skin: No rash. Musculoskeletal: Positive joint pain, no fibromyalgia. No neck pain, back pain. Lymphadenopathy: No lymphadenopathy. Hematology: No anemia or easy bruising.   Blood pressure (!) 118/93, pulse (!) 104, temperature 98.9 F (37.2 C), temperature source Oral, resp. rate 18, height 5\' 6"  (1.676 m), weight 103.4 kg, SpO2 100 %. Body mass index is 36.8 kg/m. General appearance: alert, cooperative, appears stated age and no distress Head: Normocephalic, atraumatic. Eyes: Blue eyes, pink conjunctiva, corneas clear. PERRL, EOM's intact. Neck: No adenopathy, no carotid bruit, no JVD, supple, symmetrical, trachea midline and thyroid not enlarged. Resp: Clear to auscultation bilaterally. Cardio: Irregular rate and rhythm, S1, S2 normal, II/VI systolic murmur, no click, rub or gallop GI: Soft, bowel sounds normal; no organomegaly. Extremities: Trace edema, no cyanosis or  clubbing. Skin: Warm and dry.  Neurologic: Alert and oriented X 3, normal strength.  Assessment/Plan Syncope Atrial fibrillation with moderate HR control, CHA2DS2VASc score of 4 CAD Mild to moderate AS Moderate MS with MR Hypothyroidism Obesity Acute kidney injury, stage IIIa  Increase Diltiazem to 120 mg. 24 hour SR one daily. Add small dose Toprol XL 25 mg. One daily. Eliquis bid per pharmacy. Check lipid panel. Patient is not in favor of Statin use and will read about Repatha injections q 2 weeks before she makes any choice. Discussed Ablation and TEE with cardioversion. Patient wants to wait with medications and reconsider options on f/u in 2 weeks. Discussed need for cardiac  catheterization/Nuclear stress test or Ct coronary. Patient wants to wait for cardiac interventions and reconsider options on follow up. Patient understood if she has heart attack to call 911 and go to ER and get emergent cardiac intervention.   Time spent: Review of old records, Lab, x-rays, EKG, other cardiac tests, examination, discussion with patient, nurse and referring doctor over 70 minutes.  Birdie Riddle, MD  02/06/2020, 3:09 PM

## 2020-02-06 NOTE — Progress Notes (Signed)
[place] for heparin Indication: pulmonary embolus  Allergies  Allergen Reactions  . Codeine Nausea Only    Patient Measurements: Height: 5\' 6"  (167.6 cm) Weight: 103.4 kg (228 lb) IBW/kg (Calculated) : 59.3 Heparin Dosing Weight: 82.9kg  Vital Signs: BP: 140/103 (10/29 0815) Pulse Rate: 59 (10/29 0815)  Labs: Recent Labs    02/05/20 1804 02/05/20 2006 02/06/20 0249 02/06/20 0816  HGB 14.2  --  13.3  --   HCT 44.6  --  41.3  --   PLT 161  --  156  --   HEPARINUNFRC  --   --  0.71* 0.60  CREATININE 1.71*  --  1.64*  --   TROPONINIHS 17 24*  --   --     Estimated Creatinine Clearance: 41 mL/min (A) (by C-G formula based on SCr of 1.64 mg/dL (H)).  Assessment: 64 year old female presenting with fatigue and mild shortness of breath. Pt has past history of provoked PE (20 years ago) and is not on anticoagulation PTA. Pt has noted to be in afib on evaluation. Pharmacy is consulted to dose heparin in the setting of possible chronic PE as visualized on chest CT.    Heparin level is therapeutic at 0.6. No bleeding noted.   Goal of Therapy:  Heparin level 0.3-0.7 units/ml Monitor platelets by anticoagulation protocol: Yes   Plan:  Continue heparin gtt 1400 units/hr Daily heparin level and CBC F/u plans for long-term [facility]  Salome Arnt, PharmD, BCPS Clinical Pharmacist Please see AMION for all pharmacy numbers 02/06/2020 9:13 AM

## 2020-02-06 NOTE — ED Notes (Signed)
Placed pt on 2L 02 per [place], Sa02 100%

## 2020-02-06 NOTE — ED Notes (Signed)
Attempted to give report to floor on pt and was told they did not approve pt yet.

## 2020-02-06 NOTE — Progress Notes (Addendum)
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 -Patient had worsening shortness of breath and swelling back in July for which her PCP put her on diuretics with good response -Also not noted to have mild to moderate aortic and mitral stenosis on previous echocardiogram, in March of this year a coronary CTA was being planned to evaluate for CAD however due to multiple issues this never materialized -Week prior to admission patient reports did increase malaise, some dyspnea on exertion, intermittent dizziness followed by an episode of syncope yesterday. -She denied increase edema, no orthopnea -In the ED she was noted to be in atrial fibrillation with heart rate in the 10 5-1 20 range -CT chest showed possible small chronic PE  Assessment & Plan:   Syncope -Likely related to A. Fib, valvular heart disease, could also have underlying coronary disease -PE on imaging is small and likely chronic -Monitor on telemetry -2D echocardiogram today -no evidence of ACS  Atrial fibrillation with RVR -Started on heparin in the emergency room overnight, add low-dose Cardizem -Will request cardiology input -Follow-up echo, TSH is normal -Patient has concerns regarding new medications, will ask pharmacy to discuss Xarelto/Eliquis with patient again  Small chronic PE -Noted on CTA chest yesterday, now on IV heparin -Change to DOAC tomorrow -Remote PE 20 years ago related to plane trips  Possible coronary disease -CT noted significant calcification of the coronary arteries, mitral annulus and aortic valve -Follow-up echo -May have underlying coronary disease, may benefit from a Lexiscan Myoview at some point, timing will be deferred to cardiology, unfortunately has  mild renal insufficiency too  AKI on CKD stage IIIa -Creatinine was 1.27 in June/2020, no recent baseline, now 1.64 -Monitor  Hypothyroidism -Continue Synthroid  DVT prophylaxis: IV heparin Code Status: Full code Family Communication: Discussed with patient in detail, no family at bedside Disposition Plan:  Status is: Observation  The patient will require care spanning > 2 midnights and should be moved to inpatient because: Inpatient level of care appropriate due to severity of illness  Dispo: The patient is from: Home              Anticipated d/c is to: Home              Anticipated d/c date is: 1 day              Patient currently is not medically stable to d/c. Consultants:  Cardiology  Procedures:   Antimicrobials:    Subjective: -Feels okay overall, complains of weakness and malaise for the past week, intermittent dizziness  Objective: Vitals:   02/06/20 0945 02/06/20 1008 02/06/20 1009 02/06/20 1028  BP:   137/80 135/89  Pulse: 96  91 (!) 104  Resp:   18 18  Temp:  98.1 F (36.7 C)  98.9 F (37.2 C)  TempSrc:  Oral  Oral  SpO2: 99%  99% 100%  Weight:      Height:        Intake/Output Summary (Last 24 hours) at 02/06/2020 1342 Last data filed at 02/05/2020 2230 Gross per 24 hour  Intake 1700 ml  Output --  Net 1700 ml   Filed Weights   02/05/20 1701  Weight: 103.4 kg    Examination:  General exam: Pleasant obese female sitting up  in bed, AAOx3, no distress CVS: S1-S2, irregularly irregular rhythm, systolic murmur noted Lungs: Clear bilaterally Abdomen: Soft, nontender, bowel sounds present Extremities: No edema  Skin: No rashes on exposed skin Psychiatry:  Mood & affect appropriate.     Data Reviewed:   CBC: Recent Labs  Lab 02/05/20 1804 02/06/20 0249  WBC 9.9 7.6  HGB 14.2 13.3  HCT 44.6 41.3  MCV 91.6 90.8  PLT 161 259   Basic Metabolic Panel: Recent Labs  Lab 02/05/20 1804 02/06/20 0249  NA 140 141  K 4.6 4.4  CL 100  101  CO2 26 28  GLUCOSE 154* 102*  BUN 38* 38*  CREATININE 1.71* 1.64*  CALCIUM 9.3 9.1   GFR: Estimated Creatinine Clearance: 41 mL/min (A) (by C-G formula based on SCr of 1.64 mg/dL (H)). Liver Function Tests: No results for input(s): AST, ALT, ALKPHOS, BILITOT, PROT, ALBUMIN in the last 168 hours. No results for input(s): LIPASE, AMYLASE in the last 168 hours. No results for input(s): AMMONIA in the last 168 hours. Coagulation Profile: No results for input(s): INR, PROTIME in the last 168 hours. Cardiac Enzymes: No results for input(s): CKTOTAL, CKMB, CKMBINDEX, TROPONINI in the last 168 hours. BNP (last 3 results) No results for input(s): PROBNP in the last 8760 hours. HbA1C: No results for input(s): HGBA1C in the last 72 hours. CBG: Recent Labs  Lab 02/05/20 1833  GLUCAP 140*   Lipid Profile: No results for input(s): CHOL, HDL, LDLCALC, TRIG, CHOLHDL, LDLDIRECT in the last 72 hours. Thyroid Function Tests: Recent Labs    02/06/20 0249  TSH 3.024   Anemia Panel: No results for input(s): VITAMINB12, FOLATE, FERRITIN, TIBC, IRON, RETICCTPCT in the last 72 hours. Urine analysis:    Component Value Date/Time   COLORURINE AMBER (A) 02/05/2020 2006   APPEARANCEUR CLOUDY (A) 02/05/2020 2006   LABSPEC 1.023 02/05/2020 2006   PHURINE 5.0 02/05/2020 2006   GLUCOSEU NEGATIVE 02/05/2020 2006   HGBUR NEGATIVE 02/05/2020 2006   [place] NEGATIVE 02/05/2020 2006   KETONESUR 20 (A) 02/05/2020 2006   PROTEINUR 30 (A) 02/05/2020 2006   NITRITE NEGATIVE 02/05/2020 2006   LEUKOCYTESUR NEGATIVE 02/05/2020 2006   Sepsis Labs: @LABRCNTIP (procalcitonin:4,lacticidven:4)  ) Recent Results (from the past 240 hour(s))  Respiratory Panel by RT PCR (Flu A&B, Covid) - Nasopharyngeal Swab     Status: None   Collection Time: 02/05/20  6:04 PM   Specimen: Nasopharyngeal Swab  Result Value Ref Range Status   SARS Coronavirus 2 by RT PCR NEGATIVE NEGATIVE Final    Comment:  (NOTE) SARS-CoV-2 target nucleic acids are NOT DETECTED.  The SARS-CoV-2 RNA is generally detectable in upper respiratoy specimens during the acute phase of infection. The lowest concentration of SARS-CoV-2 viral copies this assay can detect is 131 copies/mL. A negative result does not preclude SARS-Cov-2 infection and should not be used as the sole basis for treatment or other patient management decisions. A negative result may occur with  improper specimen collection/handling, submission of specimen other than nasopharyngeal swab, presence of viral mutation(s) within the areas targeted by this assay, and inadequate number of viral copies (<131 copies/mL). A negative result must be combined with clinical observations, patient history, and epidemiological information. The expected result is Negative.  Fact Sheet for Patients:  PinkCheek.be  Fact Sheet for Healthcare Providers:  GravelBags.it  This test is no t yet approved or cleared by the [place] FDA and  has been authorized for detection and/or diagnosis of SARS-CoV-2 by FDA under  an Emergency Use Authorization (EUA). This EUA will remain  in effect (meaning this test can be used) for the duration of the COVID-19 declaration under Section 564(b)(1) of the Act, 21 U.S.C. section 360bbb-3(b)(1), unless the authorization is terminated or revoked sooner.     Influenza A by PCR NEGATIVE NEGATIVE Final   Influenza B by PCR NEGATIVE NEGATIVE Final    Comment: (NOTE) The Xpert Xpress SARS-CoV-2/FLU/RSV assay is intended as an aid in  the diagnosis of influenza from Nasopharyngeal swab specimens and  should not be used as a sole basis for treatment. Nasal washings and  aspirates are unacceptable for Xpert Xpress SARS-CoV-2/FLU/RSV  testing.  Fact Sheet for Patients: PinkCheek.be  Fact Sheet for Healthcare  Providers: GravelBags.it  This test is not yet approved or cleared by the [place] FDA and  has been authorized for detection and/or diagnosis of SARS-CoV-2 by  FDA under an Emergency Use Authorization (EUA). This EUA will remain  in effect (meaning this test can be used) for the duration of the  Covid-19 declaration under Section 564(b)(1) of the Act, 21  U.S.C. section 360bbb-3(b)(1), unless the authorization is  terminated or revoked. Performed at [facility], [place] [address], [place], [place] [address]     Radiology Studies: CT HEAD WO CONTRAST  Result Date: 02/05/2020 CLINICAL DATA:  64 year old female with trauma. EXAM: CT HEAD WITHOUT CONTRAST CT CERVICAL SPINE WITHOUT CONTRAST TECHNIQUE: Multidetector CT imaging of the head and cervical spine was performed following the standard protocol without intravenous contrast. Multiplanar CT image reconstructions of the cervical spine were also generated. COMPARISON:  None. FINDINGS: CT HEAD FINDINGS Brain: Mild age-related atrophy and chronic microvascular ischemic changes. There is no acute intracranial hemorrhage. No mass effect or midline shift. No extra-axial fluid collection. Vascular: No hyperdense vessel or unexpected calcification. Skull: Normal. Negative for fracture or focal lesion. Sinuses/Orbits: No acute finding. Other: None CT CERVICAL SPINE FINDINGS Alignment: No acute subluxation. Skull base and vertebrae: No acute fracture. Soft tissues and spinal canal: No prevertebral fluid or swelling. No visible canal hematoma. Disc levels:  Degenerative changes. Upper chest: There is pulmonary vascular prominence with cephalization, likely vascular congestion. Clinical correlation is recommended. Other: Bilateral carotid bulb calcified plaques. IMPRESSION: 1. No acute intracranial pathology. Mild age-related atrophy and chronic microvascular ischemic changes. 2. No acute/traumatic cervical spine pathology.  Electronically Signed   By: Anner Crete M.D.   On: 02/05/2020 18:39   CT Angio Chest PE W and/or Wo Contrast  Result Date: 02/05/2020 CLINICAL DATA:  Positive D-dimer, hypoxia, syncope, history of prior PE EXAM: CT ANGIOGRAPHY CHEST WITH CONTRAST TECHNIQUE: Multidetector CT imaging of the chest was performed using the standard protocol during bolus administration of intravenous contrast. Multiplanar CT image reconstructions and MIPs were obtained to evaluate the vascular anatomy. CONTRAST:  72mL OMNIPAQUE IOHEXOL 350 MG/ML SOLN COMPARISON:  Radiograph 02/05/2020 FINDINGS: Cardiovascular: While there is satisfactory central pulmonary arterial opacification. Contrast opacification of pulmonary arteries is significantly diminished beyond the central and lobar segments. However, there are several areas which are concerning for pulmonary artery emboli. The first being some thin linear almost web-like filling defects present in the right intralobar pulmonary artery which could suggest some chronic embolic disease albeit with more expanded and unopacified segmental branches in the posteromedial basal segments of the right lower lobe as well as several segmental branches in the left lower lobe as well. Central pulmonary arteries are top-normal caliber. RV/LV ratio is maintained. Cardiac size is top normal.  Three-vessel coronary artery calcifications are present. Dense calcification is present in the mitral annulus. Additional calcifications are present upon the aortic leaflets. Atherosclerotic plaque within the normal caliber aorta. Suboptimal opacification for luminal evaluation of the aorta. No gross aortic abnormality is seen. Normal 3 vessel branching of the aortic arch. Proximal great vessels are calcified and slightly tortuous but otherwise unremarkable. No major venous abnormalities are seen. Mediastinum/Nodes: No mediastinal fluid or gas. Normal thyroid gland and thoracic inlet. No acute abnormality of the  trachea. Small hiatal hernia without other acute esophageal abnormality. No worrisome mediastinal, hilar or axillary adenopathy. Lungs/Pleura: Lung volumes are low with dependent atelectatic change and additional bandlike areas of opacity in both lower lobes and the right middle lobe likely reflecting further subsegmental atelectasis or scarring. No pneumothorax or pleural effusion. Some mild mosaic attenuation may reflect air trapping or atelectatic change accentuated by imaging during exhalation. 7 mm juxta phrenic nodule seen in the right lower lobe (6/84). No other concerning pulmonary nodules or masses. Upper Abdomen: No acute abnormalities present in the visualized portions of the upper abdomen. Musculoskeletal: Multilevel degenerative changes are present in the imaged portions of the spine. Additional degenerative changes in the shoulders. No worrisome chest wall lesions. Review of the MIP images confirms the above findings. IMPRESSION: 1. Excellent opacification of the central lobar pulmonary arteries however there is mixing artifact within the more distal segmental and subsegmental branches. Web-like filling defects are seen in the right intralobar pulmonary artery which could suggest some chronic pulmonary embolus in this patient history of prior embolic disease. Suspect additional filling defects within segmental and subsegmental branches of both lower lobes though these may be accentuated by mixing artifact. No convincing CT evidence of right heart strain at this time. 2. Cardiomegaly. Extensive calcifications upon the mitral annulus and to a lesser extent the aortic leaflets may warrant further evaluation with echocardiography to assess for underlying valvular dysfunction. Three-vessel coronary artery calcifications. 3. Low lung volumes and basilar atelectatic changes with some more mosaic attenuation likely reflecting further atelectasis or air trapping. 4. 7 mm juxta phrenic nodule in the right lower  lobe. Non-contrast chest CT at 6-12 months is recommended. If the nodule is stable at time of repeat CT, then future CT at 18-24 months (from today's scan) is considered optional for low-risk patients, but is recommended for high-risk patients. This recommendation follows the consensus statement: Guidelines for Management of Incidental Pulmonary Nodules Detected on CT Images: From the Fleischner Society 2017; Radiology 2017; 284:228-243. 5. Aortic Atherosclerosis (ICD10-I70.0). Critical Value/emergent results were called by telephone at the time of interpretation on 02/05/2020 at 9:12 pm to provider [facility] , who verbally acknowledged these results. Electronically Signed   By: Lovena Le M.D.   On: 02/05/2020 21:13   CT Cervical Spine Wo Contrast  Result Date: 02/05/2020 CLINICAL DATA:  64 year old female with trauma. EXAM: CT HEAD WITHOUT CONTRAST CT CERVICAL SPINE WITHOUT CONTRAST TECHNIQUE: Multidetector CT imaging of the head and cervical spine was performed following the standard protocol without intravenous contrast. Multiplanar CT image reconstructions of the cervical spine were also generated. COMPARISON:  None. FINDINGS: CT HEAD FINDINGS Brain: Mild age-related atrophy and chronic microvascular ischemic changes. There is no acute intracranial hemorrhage. No mass effect or midline shift. No extra-axial fluid collection. Vascular: No hyperdense vessel or unexpected calcification. Skull: Normal. Negative for fracture or focal lesion. Sinuses/Orbits: No acute finding. Other: None CT CERVICAL SPINE FINDINGS Alignment: No acute subluxation. Skull base and vertebrae: No acute fracture.  Soft tissues and spinal canal: No prevertebral fluid or swelling. No visible canal hematoma. Disc levels:  Degenerative changes. Upper chest: There is pulmonary vascular prominence with cephalization, likely vascular congestion. Clinical correlation is recommended. Other: Bilateral carotid bulb calcified plaques.  IMPRESSION: 1. No acute intracranial pathology. Mild age-related atrophy and chronic microvascular ischemic changes. 2. No acute/traumatic cervical spine pathology. Electronically Signed   By: Anner Crete M.D.   On: 02/05/2020 18:39   DG Chest Portable 1 View  Result Date: 02/05/2020 CLINICAL DATA:  64 year old female with shortness of breath. EXAM: PORTABLE CHEST 1 VIEW COMPARISON:  None. FINDINGS: Minimal bibasilar atelectasis. No focal consolidation, pleural effusion, pneumothorax. The cardiac silhouette is within limits. Atherosclerotic calcification of the aorta. No acute osseous pathology. IMPRESSION: No active disease. Electronically Signed   By: Anner Crete M.D.   On: 02/05/2020 20:21   ECHOCARDIOGRAM COMPLETE  Result Date: 02/06/2020    ECHOCARDIOGRAM REPORT   Patient Name:   MARGIA WIESEN Date of Exam: 02/06/2020 Medical Rec #:  [id number]      Height:       66.0 in Accession #:    [id number]     Weight:       228.0 lb Date of Birth:  [birth year]       BSA:          2.114 m Patient Age:    1 years       BP:           106/70 mmHg Patient Gender: F              HR:           120 bpm. Exam Location:  Inpatient Procedure: 2D Echo, 3D Echo, Cardiac Doppler and Color Doppler Indications:    I26.02 Pulmonary embolus  History:        Patient has prior history of Echocardiogram examinations, most                 recent 05/02/2018. Abnormal ECG, Aortic Valve Disease;                 Arrythmias:Atrial Fibrillation.  Sonographer:    Roseanna Rainbow RDCS Referring Phys: [place]  Sonographer Comments: Technically difficult study due to poor echo windows. Image acquisition challenging due to patient body habitus. IMPRESSIONS  1. Left ventricular ejection fraction, by estimation, is 55 to 60%. The left ventricle has normal function. The left ventricle has no regional wall motion abnormalities. There is mild concentric left ventricular hypertrophy. Left ventricular diastolic function could not  be evaluated.  2. Moderate calcific mitral stenosis is present. MG 5.6 mmHG @ 109 bpm. MVA by VTI 1.86 cm2. Mild to moderate MR is present which is related to restricted movement of the PMVL (IIIB). The mitral valve is degenerative. Mild to moderate mitral valve regurgitation. Moderate mitral stenosis. Severe mitral annular calcification.  3. Mild to moderate AS is present. V max 2.25 m/s, MG 12.3 mmHG, AVA 1.29 cm2, DI 0.34. Gradients lower than expected due to low SVI (SV=56 cc, SVI=26 cc/m2). The aortic valve is tricuspid. There is moderate calcification of the aortic valve. There is moderate thickening of the aortic valve. Aortic valve regurgitation is not visualized. Mild to moderate aortic valve stenosis. Aortic valve area, by VTI measures 1.29 cm. Aortic valve mean gradient measures 12.3 mmHg. Aortic valve Vmax measures 2.25 m/s.  4. Right ventricular systolic function is normal. The right ventricular size is mildly enlarged. There is  mildly elevated pulmonary artery systolic pressure. The estimated right ventricular systolic pressure is 03.5 mmHg.  5. Left atrial size was severely dilated.  6. The inferior vena cava is dilated in size with <50% respiratory variability, suggesting right atrial pressure of 15 mmHg. Comparison(s): Changes from prior study are noted. Afib with RVR is now present. Valvular heart disease is stable. FINDINGS  Left Ventricle: Left ventricular ejection fraction, by estimation, is 55 to 60%. The left ventricle has normal function. The left ventricle has no regional wall motion abnormalities. The left ventricular internal cavity size was normal in size. There is  mild concentric left ventricular hypertrophy. Left ventricular diastolic function could not be evaluated due to atrial fibrillation. Left ventricular diastolic function could not be evaluated. Right Ventricle: The right ventricular size is mildly enlarged. No increase in right ventricular wall thickness. Right ventricular  systolic function is normal. There is mildly elevated pulmonary artery systolic pressure. The tricuspid regurgitant velocity is 2.28 m/s, and with an assumed right atrial pressure of 15 mmHg, the estimated right ventricular systolic pressure is 00.9 mmHg. Left Atrium: Left atrial size was severely dilated. Right Atrium: Right atrial size was normal in size. Pericardium: Trivial pericardial effusion is present. Presence of pericardial fat pad. Mitral Valve: Moderate calcific mitral stenosis is present. MG 5.6 mmHG @ 109 bpm. MVA by VTI 1.86 cm2. Mild to moderate MR is present which is related to restricted movement of the PMVL (IIIB). The mitral valve is degenerative in appearance. Severe mitral annular calcification. Mild to moderate mitral valve regurgitation. Moderate mitral valve stenosis. MV peak gradient, 14.8 mmHg. The mean mitral valve gradient is 5.6 mmHg with average heart rate of 109 bpm. Tricuspid Valve: The tricuspid valve is grossly normal. Tricuspid valve regurgitation is trivial. No evidence of tricuspid stenosis. Aortic Valve: Mild to moderate AS is present. V max 2.25 m/s, MG 12.3 mmHG, AVA 1.29 cm2, DI 0.34. Gradients lower than expected due to low SVI (SV=56 cc, SVI=26 cc/m2). The aortic valve is tricuspid. There is moderate calcification of the aortic valve. There is moderate thickening of the aortic valve. Aortic valve regurgitation is not visualized. Mild to moderate aortic stenosis is present. Aortic valve mean gradient measures 12.3 mmHg. Aortic valve peak gradient measures 20.3 mmHg. Aortic valve area, by VTI measures 1.29 cm. Pulmonic Valve: The pulmonic valve was grossly normal. Pulmonic valve regurgitation is not visualized. No evidence of pulmonic stenosis. Aorta: The aortic root and ascending aorta are structurally normal, with no evidence of dilitation. Venous: The inferior vena cava is dilated in size with less than 50% respiratory variability, suggesting right atrial pressure of 15  mmHg. IAS/Shunts: The atrial septum is grossly normal.  LEFT VENTRICLE PLAX 2D LVIDd:         4.80 cm LVIDs:         3.90 cm LV PW:         1.70 cm LV IVS:        1.40 cm LVOT diam:     2.20 cm LV SV:         56 LV SV Index:   26 LVOT Area:     3.80 cm  LV Volumes (MOD) LV vol d, MOD A2C: 96.5 ml LV vol d, MOD A4C: 68.9 ml LV vol s, MOD A2C: 58.2 ml LV vol s, MOD A4C: 33.1 ml LV SV MOD A2C:     38.3 ml LV SV MOD A4C:     68.9 ml LV SV MOD BP:  41.1 ml RIGHT VENTRICLE            IVC RV S prime:     6.96 cm/s  IVC diam: 2.50 cm TAPSE (M-mode): 2.0 cm LEFT ATRIUM              Index        RIGHT ATRIUM           Index LA diam:        4.50 cm  2.13 cm/m   RA Area:     19.00 cm LA Vol (A2C):   220.0 ml 104.05 ml/m RA Volume:   61.20 ml  28.95 ml/m LA Vol (A4C):   127.0 ml 60.07 ml/m LA Biplane Vol: 176.0 ml 83.24 ml/m  AORTIC VALVE AV Area (Vmax):    1.58 cm AV Area (Vmean):   1.27 cm AV Area (VTI):     1.29 cm AV Vmax:           225.33 cm/s AV Vmean:          166.667 cm/s AV VTI:            0.435 m AV Peak Grad:      20.3 mmHg AV Mean Grad:      12.3 mmHg LVOT Vmax:         93.80 cm/s LVOT Vmean:        55.900 cm/s LVOT VTI:          0.147 m LVOT/AV VTI ratio: 0.34  AORTA Ao Root diam: 3.50 cm Ao Asc diam:  3.80 cm MITRAL VALVE                TRICUSPID VALVE MV Area (PHT): 3.29 cm     TR Peak grad:   20.8 mmHg MV Area VTI:   1.86 cm     TR Vmax:        228.00 cm/s MV Peak grad:  14.8 mmHg MV Mean grad:  5.6 mmHg     SHUNTS MV Vmax:       1.92 m/s     Systemic VTI:  0.15 m MV Vmean:      98.8 cm/s    Systemic Diam: 2.20 cm MV VTI:        0.30 m MV Decel Time: 231 msec MR Peak grad: 108.2 mmHg MR Mean grad: 71.0 mmHg MR Vmax:      520.00 cm/s MR Vmean:     405.0 cm/s MV E velocity: 165.00 cm/s Eleonore Chiquito MD Electronically signed by Eleonore Chiquito MD Signature Date/Time: 02/06/2020/11:41:22 AM    Final    VAS [place] LOWER EXTREMITY VENOUS (DVT)  Result Date: 02/06/2020  Lower Venous DVTStudy Indications:  Pulmonary embolism.  Anticoagulation: Heparin. Limitations: Poor ultrasound/tissue interface. Comparison Study: No prior studies. Performing Technologist: Darlin Coco  Examination Guidelines: A complete evaluation includes B-mode imaging, spectral Doppler, color Doppler, and power Doppler as needed of all accessible portions of each vessel. Bilateral testing is considered an integral part of a complete examination. Limited examinations for reoccurring indications may be performed as noted. The reflux portion of the exam is performed with the patient in reverse Trendelenburg.  +---------+---------------+---------+-----------+----------+--------------+ RIGHT    CompressibilityPhasicitySpontaneityPropertiesThrombus Aging +---------+---------------+---------+-----------+----------+--------------+ CFV      Full           Yes      Yes                                 +---------+---------------+---------+-----------+----------+--------------+  SFJ      Full                                                        +---------+---------------+---------+-----------+----------+--------------+ FV Prox  Full                                                        +---------+---------------+---------+-----------+----------+--------------+ FV Mid   Full                                                        +---------+---------------+---------+-----------+----------+--------------+ FV DistalFull                                                        +---------+---------------+---------+-----------+----------+--------------+ PFV      Full                                                        +---------+---------------+---------+-----------+----------+--------------+ POP      Full           Yes      Yes                                 +---------+---------------+---------+-----------+----------+--------------+ PTV      Full                                                         +---------+---------------+---------+-----------+----------+--------------+ PERO     Full                                                        +---------+---------------+---------+-----------+----------+--------------+   +---------+---------------+---------+-----------+----------+--------------+ LEFT     CompressibilityPhasicitySpontaneityPropertiesThrombus Aging +---------+---------------+---------+-----------+----------+--------------+ CFV      Full           Yes      Yes                                 +---------+---------------+---------+-----------+----------+--------------+ SFJ      Full                                                        +---------+---------------+---------+-----------+----------+--------------+  FV Prox  Full                                                        +---------+---------------+---------+-----------+----------+--------------+ FV Mid   Full                                                        +---------+---------------+---------+-----------+----------+--------------+ FV DistalFull                                                        +---------+---------------+---------+-----------+----------+--------------+ PFV      Full                                                        +---------+---------------+---------+-----------+----------+--------------+ POP      Full           Yes      Yes                                 +---------+---------------+---------+-----------+----------+--------------+ PTV      Full                                                        +---------+---------------+---------+-----------+----------+--------------+ PERO                                                  Not visualized +---------+---------------+---------+-----------+----------+--------------+     Summary: RIGHT: - There is no evidence of deep vein thrombosis in the lower extremity.  - No cystic structure found  in the popliteal fossa.  LEFT: - There is no evidence of deep vein thrombosis in the lower extremity. However, portions of this examination were limited- see technologist comments above.  - A cystic structure is found in the popliteal fossa.  *See table(s) above for measurements and observations. Electronically signed by Deitra Mayo MD on 02/06/2020 at 1:03:21 PM.    Final         Scheduled Meds: . Derrill Memo ON 02/07/2020] levothyroxine  50 mcg Oral Once per day on Sun Tue Thu Sat  . [START ON 02/09/2020] levothyroxine  75 mcg Oral Once per day on Mon Wed Fri   Continuous Infusions: . heparin 1,400 Units/hr (02/05/20 2228)     LOS: 0 days    Time spent: 67min  Domenic Polite, MD [facility]  02/06/2020, 1:42 PM

## 2020-02-06 NOTE — H&P (Addendum)
History and Physical    Cynthia Cook DEY:[id number] DOB: 1955/07/11 DOA: 02/05/2020  PCP: Shon Baton, MD  Patient coming from: Home.  Chief Complaint: Loss of consciousness.  HPI: Cynthia Cook is a 64 y.o. female with history of gout, hypothyroidism previous history of pulmonary embolism about 20 years ago when patient states she traveled extensively on the flight had taken anticoagulation for about a year at that time has been feeling weak at times dizzy for the last 4 to 5 days.  Her weakness is mostly on exertion.  Denies any nausea vomiting or diarrhea but has been having some neck pain and chest burning sensation.  Patient decided to go to her PCP today and was waiting for a ride when she passed out.  Patient states she may have passed out for less than a minute.  Did not have any incontinence of urine or bowel.  Patient was brought to the ER.  ED Course: In the ER patient is found to be in A. fib rate of 109 bpm CT angiogram of the head and neck was unremarkable.  Labs show D-dimer 1.26 and had a CT angiogram of the chest which shows features concerning for chronic pulmonary embolism in the right interlobar pulmonary artery and also filling defects in the subsegmental and segmental arteries.  Patient was started on heparin for PE and also has A. fib presently rate around 100 bpm.  On exam patient is appearing nonfocal.  Labs are significant for creatinine 1.7 which increased from last year 1.2.  CBC unremarkable.  Covid test was negative.  High-sensitivity troponin was 17 BNP of 341.  Review of Systems: As per HPI, rest all negative.   Past Medical History:  Diagnosis Date   Back pain    Murmur    PE (pulmonary embolism)    Thyroid disease     History reviewed. No pertinent surgical history.   reports that she has never smoked. She has never used smokeless tobacco. She reports that she does not drink alcohol. No history on file for drug use.  Allergies  Allergen  Reactions   Codeine Nausea Only    Family History  Family history unknown: Yes    Prior to Admission medications   Medication Sig Start Date End Date Taking? Authorizing Provider  aspirin EC 325 MG tablet Take 325 mg by mouth daily as needed for moderate pain.   Yes [provider]  B Complex-C (B-COMPLEX WITH VITAMIN C) tablet Take 1 tablet by mouth daily.   Yes [provider]  co-enzyme Q-10 30 MG capsule Take 30 mg by mouth daily.   Yes [provider]  furosemide (LASIX) 20 MG tablet Take 20 mg by mouth as directed. Take 1 tablet 5 Days A Week 11/07/19  Yes [provider]  levothyroxine (SYNTHROID) 50 MCG tablet Take 50-75 mcg by mouth daily. Take 75 mcg (1.5 tablets) on MWF and Take 50 mcg (1 tablet) all other days 09/11/18  Yes [provider]  Menaquinone-7 (VITAMIN K2) 100 MCG CAPS Take by mouth.   Yes [provider]  Multiple Vitamins-Minerals (MULTIVITAMIN WITH MINERALS) tablet Take 1 tablet by mouth daily.   Yes [provider]  Selenium 100 MCG CAPS Take by mouth.   Yes [provider]  vitamin C (ASCORBIC ACID) 250 MG tablet Take 500 mg by mouth daily.    Yes [provider]  vitamin E 400 UNIT capsule Take 400 Units by mouth daily.   Yes [provider]  Zinc 30 MG TABS Take by mouth.   Yes [provider]  ezetimibe (ZETIA) 10 MG tablet Take 1 tablet (10 mg total) by mouth daily. Patient not taking: Reported on 02/05/2020 06/23/19 06/17/20  Buford Dresser, MD  febuxostat (ULORIC) 40 MG tablet Take 40 mg by mouth daily. 02/02/20   [provider]  metoprolol tartrate (LOPRESSOR) 50 MG tablet Take 50 mg (1 tablet) TWO hours prior to CT Patient not taking: Reported on 02/05/2020 05/21/19   Elouise Munroe, MD    Physical Exam: Constitutional: Moderately built and nourished. Vitals:   02/05/20 2230 02/05/20 2245 02/05/20 2300 02/05/20 2315  BP: 126/74  119/78    Pulse: 89 90 (!) 104 97  Resp: 19 (!) 24 (!) 31 (!) 22  Temp:      TempSrc:      SpO2: 97% 91% 94% 92%  Weight:      Height:       Eyes: Anicteric no pallor. ENMT: No discharge from the ears eyes nose or mouth. Neck: No mass felt.  No neck rigidity. Respiratory: No rhonchi or crepitations. Cardiovascular: S1-S2 heard. Abdomen: Soft nontender bowel sound present. Musculoskeletal: No edema. Skin: Chronic skin changes of the both lower extremity.  Some scratch on the left anterior shin. Neurologic: Alert awake oriented to time place and person.  Moves all extremities 5 x 5. Psychiatric: Appears normal.  Normal affect.   Labs on Admission: I have personally reviewed following labs and imaging studies  CBC: Recent Labs  Lab 02/05/20 1804  WBC 9.9  HGB 14.2  HCT 44.6  MCV 91.6  PLT 737   Basic Metabolic Panel: Recent Labs  Lab 02/05/20 1804  NA 140  K 4.6  CL 100  CO2 26  GLUCOSE 154*  BUN 38*  CREATININE 1.71*  CALCIUM 9.3   GFR: Estimated Creatinine Clearance: 39.3 mL/min (A) (by C-G formula based on SCr of 1.71 mg/dL (H)). Liver Function Tests: No results for input(s): AST, ALT, ALKPHOS, BILITOT, PROT, ALBUMIN in the last 168 hours. No results for input(s): LIPASE, AMYLASE in the last 168 hours. No results for input(s): AMMONIA in the last 168 hours. Coagulation Profile: No results for input(s): INR, PROTIME in the last 168 hours. Cardiac Enzymes: No results for input(s): CKTOTAL, CKMB, CKMBINDEX, TROPONINI in the last 168 hours. BNP (last 3 results) No results for input(s): PROBNP in the last 8760 hours. HbA1C: No results for input(s): HGBA1C in the last 72 hours. CBG: Recent Labs  Lab 02/05/20 1833  GLUCAP 140*   Lipid Profile: No results for input(s): CHOL, HDL, LDLCALC, TRIG, CHOLHDL, LDLDIRECT in the last 72 hours. Thyroid Function Tests: No results for input(s): TSH, T4TOTAL, FREET4, T3FREE, THYROIDAB in the last 72 hours. Anemia Panel: No  results for input(s): VITAMINB12, FOLATE, FERRITIN, TIBC, IRON, RETICCTPCT in the last 72 hours. Urine analysis:    Component Value Date/Time   COLORURINE AMBER (A) 02/05/2020 2006   APPEARANCEUR CLOUDY (A) 02/05/2020 2006   LABSPEC 1.023 02/05/2020 2006   PHURINE 5.0 02/05/2020 2006   GLUCOSEU NEGATIVE 02/05/2020 2006   [place] NEGATIVE 02/05/2020 2006   [place] NEGATIVE 02/05/2020 2006   KETONESUR 20 (A) 02/05/2020 2006   PROTEINUR 30 (A) 02/05/2020 2006   NITRITE NEGATIVE 02/05/2020 2006   LEUKOCYTESUR NEGATIVE 02/05/2020 2006   Sepsis Labs: @LABRCNTIP (procalcitonin:4,lacticidven:4) ) Recent Results (from the past 240 hour(s))  Respiratory Panel by RT PCR (Flu A&B, Covid) - Nasopharyngeal Swab     Status:  None   Collection Time: 02/05/20  6:04 PM   Specimen: Nasopharyngeal Swab  Result Value Ref Range Status   SARS Coronavirus 2 by RT PCR NEGATIVE NEGATIVE Final    Comment: (NOTE) SARS-CoV-2 target nucleic acids are NOT DETECTED.  The SARS-CoV-2 RNA is generally detectable in upper respiratoy specimens during the acute phase of infection. The lowest concentration of SARS-CoV-2 viral copies this assay can detect is 131 copies/mL. A negative result does not preclude SARS-Cov-2 infection and should not be used as the sole basis for treatment or other patient management decisions. A negative result may occur with  improper specimen collection/handling, submission of specimen other than nasopharyngeal swab, presence of viral mutation(s) within the areas targeted by this assay, and inadequate number of viral copies (<131 copies/mL). A negative result must be combined with clinical observations, patient history, and epidemiological information. The expected result is Negative.  Fact Sheet for Patients:  PinkCheek.be  Fact Sheet for Healthcare Providers:  GravelBags.it  This test is no t yet approved or cleared by  the [place] FDA and  has been authorized for detection and/or diagnosis of SARS-CoV-2 by FDA under an Emergency Use Authorization (EUA). This EUA will remain  in effect (meaning this test can be used) for the duration of the COVID-19 declaration under Section 564(b)(1) of the Act, 21 U.S.C. section 360bbb-3(b)(1), unless the authorization is terminated or revoked sooner.     Influenza A by PCR NEGATIVE NEGATIVE Final   Influenza B by PCR NEGATIVE NEGATIVE Final    Comment: (NOTE) The Xpert Xpress SARS-CoV-2/FLU/RSV assay is intended as an aid in  the diagnosis of influenza from Nasopharyngeal swab specimens and  should not be used as a sole basis for treatment. Nasal washings and  aspirates are unacceptable for Xpert Xpress SARS-CoV-2/FLU/RSV  testing.  Fact Sheet for Patients: PinkCheek.be  Fact Sheet for Healthcare Providers: GravelBags.it  This test is not yet approved or cleared by the [place] FDA and  has been authorized for detection and/or diagnosis of SARS-CoV-2 by  FDA under an Emergency Use Authorization (EUA). This EUA will remain  in effect (meaning this test can be used) for the duration of the  Covid-19 declaration under Section 564(b)(1) of the Act, 21  U.S.C. section 360bbb-3(b)(1), unless the authorization is  terminated or revoked. Performed at [facility], [place] [address] [place], [place] [address]      Radiological Exams on Admission: CT HEAD WO CONTRAST  Result Date: 02/05/2020 CLINICAL DATA:  64 year old female with trauma. EXAM: CT HEAD WITHOUT CONTRAST CT CERVICAL SPINE WITHOUT CONTRAST TECHNIQUE: Multidetector CT imaging of the head and cervical spine was performed following the standard protocol without intravenous contrast. Multiplanar CT image reconstructions of the cervical spine were also generated. COMPARISON:  None. FINDINGS: CT HEAD FINDINGS Brain: Mild age-related  atrophy and chronic microvascular ischemic changes. There is no acute intracranial hemorrhage. No mass effect or midline shift. No extra-axial fluid collection. Vascular: No hyperdense vessel or unexpected calcification. Skull: Normal. Negative for fracture or focal lesion. Sinuses/Orbits: No acute finding. Other: None CT CERVICAL SPINE FINDINGS Alignment: No acute subluxation. Skull base and vertebrae: No acute fracture. Soft tissues and spinal canal: No prevertebral fluid or swelling. No visible canal hematoma. Disc levels:  Degenerative changes. Upper chest: There is pulmonary vascular prominence with cephalization, likely vascular congestion. Clinical correlation is recommended. Other: Bilateral carotid bulb calcified plaques. IMPRESSION: 1. No acute intracranial pathology. Mild age-related atrophy and chronic microvascular ischemic changes. 2.  No acute/traumatic cervical spine pathology. Electronically Signed   By: Anner Crete M.D.   On: 02/05/2020 18:39   CT Angio Chest PE W and/or Wo Contrast  Result Date: 02/05/2020 CLINICAL DATA:  Positive D-dimer, hypoxia, syncope, history of prior PE EXAM: CT ANGIOGRAPHY CHEST WITH CONTRAST TECHNIQUE: Multidetector CT imaging of the chest was performed using the standard protocol during bolus administration of intravenous contrast. Multiplanar CT image reconstructions and MIPs were obtained to evaluate the vascular anatomy. CONTRAST:  18mL OMNIPAQUE IOHEXOL 350 MG/ML SOLN COMPARISON:  Radiograph 02/05/2020 FINDINGS: Cardiovascular: While there is satisfactory central pulmonary arterial opacification. Contrast opacification of pulmonary arteries is significantly diminished beyond the central and lobar segments. However, there are several areas which are concerning for pulmonary artery emboli. The first being some thin linear almost web-like filling defects present in the right intralobar pulmonary artery which could suggest some chronic embolic disease albeit  with more expanded and unopacified segmental branches in the posteromedial basal segments of the right lower lobe as well as several segmental branches in the left lower lobe as well. Central pulmonary arteries are top-normal caliber. RV/LV ratio is maintained. Cardiac size is top normal. Three-vessel coronary artery calcifications are present. Dense calcification is present in the mitral annulus. Additional calcifications are present upon the aortic leaflets. Atherosclerotic plaque within the normal caliber aorta. Suboptimal opacification for luminal evaluation of the aorta. No gross aortic abnormality is seen. Normal 3 vessel branching of the aortic arch. Proximal great vessels are calcified and slightly tortuous but otherwise unremarkable. No major venous abnormalities are seen. Mediastinum/Nodes: No mediastinal fluid or gas. Normal thyroid gland and thoracic inlet. No acute abnormality of the trachea. Small hiatal hernia without other acute esophageal abnormality. No worrisome mediastinal, hilar or axillary adenopathy. Lungs/Pleura: Lung volumes are low with dependent atelectatic change and additional bandlike areas of opacity in both lower lobes and the right middle lobe likely reflecting further subsegmental atelectasis or scarring. No pneumothorax or pleural effusion. Some mild mosaic attenuation may reflect air trapping or atelectatic change accentuated by imaging during exhalation. 7 mm juxta phrenic nodule seen in the right lower lobe (6/84). No other concerning pulmonary nodules or masses. Upper Abdomen: No acute abnormalities present in the visualized portions of the upper abdomen. Musculoskeletal: Multilevel degenerative changes are present in the imaged portions of the spine. Additional degenerative changes in the shoulders. No worrisome chest wall lesions. Review of the MIP images confirms the above findings. IMPRESSION: 1. Excellent opacification of the central lobar pulmonary arteries however there  is mixing artifact within the more distal segmental and subsegmental branches. Web-like filling defects are seen in the right intralobar pulmonary artery which could suggest some chronic pulmonary embolus in this patient history of prior embolic disease. Suspect additional filling defects within segmental and subsegmental branches of both lower lobes though these may be accentuated by mixing artifact. No convincing CT evidence of right heart strain at this time. 2. Cardiomegaly. Extensive calcifications upon the mitral annulus and to a lesser extent the aortic leaflets may warrant further evaluation with echocardiography to assess for underlying valvular dysfunction. Three-vessel coronary artery calcifications. 3. Low lung volumes and basilar atelectatic changes with some more mosaic attenuation likely reflecting further atelectasis or air trapping. 4. 7 mm juxta phrenic nodule in the right lower lobe. Non-contrast chest CT at 6-12 months is recommended. If the nodule is stable at time of repeat CT, then future CT at 18-24 months (from today's scan) is considered optional for low-risk patients, but is  recommended for high-risk patients. This recommendation follows the consensus statement: Guidelines for Management of Incidental Pulmonary Nodules Detected on CT Images: From the Fleischner Society 2017; Radiology 2017; 284:228-243. 5. Aortic Atherosclerosis (ICD10-I70.0). Critical Value/emergent results were called by telephone at the time of interpretation on 02/05/2020 at 9:12 pm to provider [facility] , who verbally acknowledged these results. Electronically Signed   By: Lovena Le M.D.   On: 02/05/2020 21:13   CT Cervical Spine Wo Contrast  Result Date: 02/05/2020 CLINICAL DATA:  64 year old female with trauma. EXAM: CT HEAD WITHOUT CONTRAST CT CERVICAL SPINE WITHOUT CONTRAST TECHNIQUE: Multidetector CT imaging of the head and cervical spine was performed following the standard protocol without intravenous  contrast. Multiplanar CT image reconstructions of the cervical spine were also generated. COMPARISON:  None. FINDINGS: CT HEAD FINDINGS Brain: Mild age-related atrophy and chronic microvascular ischemic changes. There is no acute intracranial hemorrhage. No mass effect or midline shift. No extra-axial fluid collection. Vascular: No hyperdense vessel or unexpected calcification. Skull: Normal. Negative for fracture or focal lesion. Sinuses/Orbits: No acute finding. Other: None CT CERVICAL SPINE FINDINGS Alignment: No acute subluxation. Skull base and vertebrae: No acute fracture. Soft tissues and spinal canal: No prevertebral fluid or swelling. No visible canal hematoma. Disc levels:  Degenerative changes. Upper chest: There is pulmonary vascular prominence with cephalization, likely vascular congestion. Clinical correlation is recommended. Other: Bilateral carotid bulb calcified plaques. IMPRESSION: 1. No acute intracranial pathology. Mild age-related atrophy and chronic microvascular ischemic changes. 2. No acute/traumatic cervical spine pathology. Electronically Signed   By: Anner Crete M.D.   On: 02/05/2020 18:39   DG Chest Portable 1 View  Result Date: 02/05/2020 CLINICAL DATA:  64 year old female with shortness of breath. EXAM: PORTABLE CHEST 1 VIEW COMPARISON:  None. FINDINGS: Minimal bibasilar atelectasis. No focal consolidation, pleural effusion, pneumothorax. The cardiac silhouette is within limits. Atherosclerotic calcification of the aorta. No acute osseous pathology. IMPRESSION: No active disease. Electronically Signed   By: Anner Crete M.D.   On: 02/05/2020 20:21    EKG: Independently reviewed.  A. fib rate around 100 bpm.  Assessment/Plan Principal Problem:   Pulmonary emboli (HCC) Active Problems:   ARF (acute renal failure) (HCC)   Hypothyroidism   Pulmonary embolism ([place])    1. Pulmonary embolism with previous history of pulmonary embolism at this time we will place  patient on heparin trend cardiac markers check 2D echo and if patient remains hemodynamically stable change to oral anticoagulants.  Check Dopplers of the lower extremities. 2. Syncope could be from either pulmonary embolism or A. fib.  Monitor in telemetry.  Patient's CT scan does show significant calcification of the mitral annulus.  Follow 2D echo. 3. Significant calcification of the coronary arteries and mitral annulus and given the history of A. fib and also syncope with exertional symptoms will consult cardiology.  Follow 2D echo. 4. New onset A. fib presently rate is around 100 bpm.  Check TSH follow 2D echo presently on heparin.  Chads 2 vasc score is around 1.  Will await cardiology input. 5. Acute on chronic kidney disease stage III follow metabolic panel. 6. Hypothyroidism on Synthroid. 7. History of gout presently just changed to Uroxatrol which patient has still not taken.  Patient likes to hold off with for now.   DVT prophylaxis: Heparin. Code Status: Full code. Family Communication: Discussed with patient. Disposition Plan: Home. Consults called: Cardiology. Admission status: Observation.   Rise Patience MD [facility] Pager [phone].  If  7PM-7AM, please contact night-coverage [email or web address] Password [facility]  02/06/2020, 12:39 AM

## 2020-02-06 NOTE — ED Notes (Signed)
Pt gets extremely tachy when sitting on side of bed and when standing, but pt keeps on insisting that she walk to the bathroom.

## 2020-02-06 NOTE — Discharge Instructions (Addendum)
Atrial Fibrillation  Atrial fibrillation is a type of heartbeat that is irregular or fast. If you have this condition, your heart beats without any order. This makes it hard for your heart to pump blood in a normal way. Atrial fibrillation may come and go, or it may become a long-lasting problem. If this condition is not treated, it can put you at higher risk for stroke, heart failure, and other heart problems. What are the causes? This condition may be caused by diseases that damage the heart. They include:  High blood pressure.  Heart failure.  Heart valve disease.  Heart surgery. Other causes include:  Diabetes.  Thyroid disease.  Being overweight.  Kidney disease. Sometimes the cause is not known. What increases the risk? You are more likely to develop this condition if:  You are older.  You smoke.  You exercise often and very hard.  You have a family history of this condition.  You are a man.  You use drugs.  You drink a lot of alcohol.  You have lung conditions, such as emphysema, pneumonia, or COPD.  You have sleep apnea. What are the signs or symptoms? Common symptoms of this condition include:  A feeling that your heart is beating very fast.  Chest pain or discomfort.  Feeling short of breath.  Suddenly feeling light-headed or weak.  Getting tired easily during activity.  Fainting.  Sweating. In some cases, there are no symptoms. How is this treated? Treatment for this condition depends on underlying conditions and how you feel when you have atrial fibrillation. They include:  Medicines to: ? Prevent blood clots. ? Treat heart rate or heart rhythm problems.  Using devices, such as a pacemaker, to correct heart rhythm problems.  Doing surgery to remove the part of the heart that sends bad signals.  Closing an area where clots can form in the heart (left atrial appendage). In some cases, your doctor will treat other underlying  conditions. Follow these instructions at home: Medicines  Take over-the-counter and prescription medicines only as told by your doctor.  Do not take any new medicines without first talking to your doctor.  If you are taking blood thinners: ? Talk with your doctor before you take any medicines that have aspirin or NSAIDs, such as ibuprofen, in them. ? Take your medicine exactly as told by your doctor. Take it at the same time each day. ? Avoid activities that could hurt or bruise you. Follow instructions about how to prevent falls. ? Wear a bracelet that says you are taking blood thinners. Or, carry a card that lists what medicines you take. Lifestyle      Do not use any products that have nicotine or tobacco in them. These include cigarettes, e-cigarettes, and chewing tobacco. If you need help quitting, ask your doctor.  Eat heart-healthy foods. Talk with your doctor about the right eating plan for you.  Exercise regularly as told by your doctor.  Do not drink alcohol.  Lose weight if you are overweight.  Do not use drugs, including cannabis. General instructions  If you have a condition that causes breathing to stop for a short period of time (apnea), treat it as told by your doctor.  Keep a healthy weight. Do not use diet pills unless your doctor says they are safe for you. Diet pills may make heart problems worse.  Keep all follow-up visits as told by your doctor. This is important. Contact a doctor if:  You notice a  change in the speed, rhythm, or strength of your heartbeat.  You are taking a blood-thinning medicine and you get more bruising.  You get tired more easily when you move or exercise.  You have a sudden change in weight. Get help right away if:   You have pain in your chest or your belly (abdomen).  You have trouble breathing.  You have side effects of blood thinners, such as blood in your vomit, poop (stool), or pee (urine), or bleeding that cannot  stop.  You have any signs of a stroke. "BE FAST" is an easy way to remember the main warning signs: ? B - Balance. Signs are dizziness, sudden trouble walking, or loss of balance. ? E - Eyes. Signs are trouble seeing or a change in how you see. ? F - Face. Signs are sudden weakness or loss of feeling in the face, or the face or eyelid drooping on one side. ? A - Arms. Signs are weakness or loss of feeling in an arm. This happens suddenly and usually on one side of the body. ? S - Speech. Signs are sudden trouble speaking, slurred speech, or trouble understanding what people say. ? T - Time. Time to call emergency services. Write down what time symptoms started.  You have other signs of a stroke, such as: ? A sudden, very bad headache with no known cause. ? Feeling like you may vomit (nausea). ? Vomiting. ? A seizure. These symptoms may be an emergency. Do not wait to see if the symptoms will go away. Get medical help right away. Call your local emergency services (911 in the [place] Do not drive yourself to the hospital. Summary  Atrial fibrillation is a type of heartbeat that is irregular or fast.  You are at higher risk of this condition if you smoke, are older, have diabetes, or are overweight.  Follow your doctor's instructions about medicines, diet, exercise, and follow-up visits.  Get help right away if you have signs or symptoms of a stroke.  Get help right away if you cannot catch your breath, or you have chest pain or discomfort. This information is not intended to replace advice given to you by your health care provider. Make sure you discuss any questions you have with your health care provider. Document Revised: 09/18/2018 Document Reviewed: 09/18/2018 Elsevier Patient Education  [place].   Atrial Fibrillation  Atrial fibrillation is a type of irregular or rapid heartbeat (arrhythmia). In atrial fibrillation, the top part of the heart (atria) beats in an  irregular pattern. This makes the heart unable to pump blood normally and effectively. The goal of treatment is to prevent blood clots from forming, control your heart rate, or restore your heartbeat to a normal rhythm. If this condition is not treated, it can cause serious problems, such as a weakened heart muscle (cardiomyopathy) or a stroke. What are the causes? This condition is often caused by medical conditions that damage the heart's electrical system. These include:  High blood pressure (hypertension). This is the most common cause.  Certain heart problems or conditions, such as heart failure, coronary artery disease, heart valve problems, or heart surgery.  Diabetes.  Overactive thyroid (hyperthyroidism).  Obesity.  Chronic kidney disease. In some cases, the cause of this condition is not known. What increases the risk? This condition is more likely to develop in:  Older people.  People who smoke.  Athletes who do endurance exercise.  People who have a family history of  atrial fibrillation.  Men.  People who use drugs.  People who drink a lot of alcohol.  People who have lung conditions, such as emphysema, pneumonia, or COPD.  People who have obstructive sleep apnea. What are the signs or symptoms? Symptoms of this condition include:  A feeling that your heart is racing or beating irregularly.  Discomfort or pain in your chest.  Shortness of breath.  Sudden light-headedness or weakness.  Tiring easily during exercise or activity.  Fatigue.  Syncope (fainting).  Sweating. In some cases, there are no symptoms. How is this diagnosed? Your health care provider may detect atrial fibrillation when taking your pulse. If detected, this condition may be diagnosed with:  An electrocardiogram (ECG) to check electrical signals of the heart.  An ambulatory cardiac monitor to record your heart's activity for a few days.  A transthoracic echocardiogram (TTE)  to create pictures of your heart.  A transesophageal echocardiogram (TEE) to create even closer pictures of your heart.  A stress test to check your blood supply while you exercise.  Imaging tests, such as a CT scan or chest X-ray.  Blood tests. How is this treated? Treatment depends on underlying conditions and how you feel when you experience atrial fibrillation. This condition may be treated with:  Medicines to prevent blood clots or to treat heart rate or heart rhythm problems.  Electrical cardioversion to reset the heart's rhythm.  A pacemaker to correct abnormal heart rhythm.  Ablation to remove the heart tissue that sends abnormal signals.  Left atrial appendage closure to seal the area where blood clots can form. In some cases, underlying conditions will be treated. Follow these instructions at home: Medicines  Take over-the counter and prescription medicines only as told by your health care provider.  Do not take any new medicines without talking to your health care provider.  If you are taking blood thinners: ? Talk with your health care provider before you take any medicines that contain aspirin or NSAIDs, such as ibuprofen. These medicines increase your risk for dangerous bleeding. ? Take your medicine exactly as told, at the same time every day. ? Avoid activities that could cause injury or bruising, and follow instructions about how to prevent falls. ? Wear a medical alert bracelet or carry a card that lists what medicines you take. Lifestyle      Do not use any products that contain nicotine or tobacco, such as cigarettes, e-cigarettes, and chewing tobacco. If you need help quitting, ask your health care provider.  Eat heart-healthy foods. Talk with a dietitian to make an eating plan that is right for you.  Exercise regularly as told by your health care provider.  Do not drink alcohol.  Lose weight if you are overweight.  Do not use drugs, including  cannabis. General instructions  If you have obstructive sleep apnea, manage your condition as told by your health care provider.  Do not use diet pills unless your health care provider approves. Diet pills can make heart problems worse.  Keep all follow-up visits as told by your health care provider. This is important. Contact a health care provider if you:  Notice a change in the rate, rhythm, or strength of your heartbeat.  Are taking a blood thinner and you notice more bruising.  Tire more easily when you exercise or do heavy work.  Have a sudden change in weight. Get help right away if you have:   Chest pain, abdominal pain, sweating, or weakness.  Trouble  breathing.  Side effects of blood thinners, such as blood in your vomit, stool, or urine, or bleeding that cannot stop.  Any symptoms of a stroke. "BE FAST" is an easy way to remember the main warning signs of a stroke: ? B - Balance. Signs are dizziness, sudden trouble walking, or loss of balance. ? E - Eyes. Signs are trouble seeing or a sudden change in vision. ? F - Face. Signs are sudden weakness or numbness of the face, or the face or eyelid drooping on one side. ? A - Arms. Signs are weakness or numbness in an arm. This happens suddenly and usually on one side of the body. ? S - Speech. Signs are sudden trouble speaking, slurred speech, or trouble understanding what people say. ? T - Time. Time to call emergency services. Write down what time symptoms started.  Other signs of a stroke, such as: ? A sudden, severe headache with no known cause. ? Nausea or vomiting. ? Seizure. These symptoms may represent a serious problem that is an emergency. Do not wait to see if the symptoms will go away. Get medical help right away. Call your local emergency services (911 in the [place] Do not drive yourself to the hospital. Summary  Atrial fibrillation is a type of irregular or rapid heartbeat (arrhythmia).  Symptoms include  a feeling that your heart is beating fast or irregularly.  You may be given medicines to prevent blood clots or to treat heart rate or heart rhythm problems.  Get help right away if you have signs or symptoms of a stroke.  Get help right away if you cannot catch your breath or have chest pain or pressure. This information is not intended to replace advice given to you by your health care provider. Make sure you discuss any questions you have with your health care provider. Document Revised: 09/18/2018 Document Reviewed: 09/18/2018 Elsevier Patient Education  [place]. Apixaban oral tablets What is this medicine? APIXABAN (a PIX a ban) is an anticoagulant (blood thinner). It is used to lower the chance of stroke in people with a medical condition called atrial fibrillation. It is also used to treat or prevent blood clots in the lungs or in the veins. This medicine may be used for other purposes; ask your health care provider or pharmacist if you have questions. COMMON BRAND NAME(S): Eliquis What should I tell my health care provider before I take this medicine? They need to know if you have any of these conditions:  antiphospholipid antibody syndrome  bleeding disorders  bleeding in the brain  blood in your stools (black or tarry stools) or if you have blood in your vomit  history of blood clots  history of stomach bleeding  kidney disease  liver disease  mechanical heart valve  an unusual or allergic reaction to apixaban, other medicines, foods, dyes, or preservatives  pregnant or trying to get pregnant  breast-feeding How should I use this medicine? Take this medicine by mouth with a glass of water. Follow the directions on the prescription label. You can take it with or without food. If it upsets your stomach, take it with food. Take your medicine at regular intervals. Do not take it more often than directed. Do not stop taking except on your doctor's advice.  Stopping this medicine may increase your risk of a blood clot. Be sure to refill your prescription before you run out of medicine. Talk to your pediatrician regarding the use of this medicine  in children. Special care may be needed. Overdosage: If you think you have taken too much of this medicine contact a poison control center or emergency room at once. NOTE: This medicine is only for you. Do not share this medicine with others. What if I miss a dose? If you miss a dose, take it as soon as you can. If it is almost time for your next dose, take only that dose. Do not take double or extra doses. What may interact with this medicine? This medicine may interact with the following:  aspirin and aspirin-like medicines  certain medicines for fungal infections like ketoconazole and itraconazole  certain medicines for seizures like carbamazepine and phenytoin  certain medicines that treat or prevent blood clots like warfarin, enoxaparin, and dalteparin  clarithromycin  NSAIDs, medicines for pain and inflammation, like ibuprofen or naproxen  rifampin  ritonavir  St. John's wort This list may not describe all possible interactions. Give your health care provider a list of all the medicines, herbs, non-prescription drugs, or dietary supplements you use. Also tell them if you smoke, drink alcohol, or use illegal drugs. Some items may interact with your medicine. What should I watch for while using this medicine? Visit your healthcare professional for regular checks on your progress. You may need blood work done while you are taking this medicine. Your condition will be monitored carefully while you are receiving this medicine. It is important not to miss any appointments. Avoid sports and activities that might cause injury while you are using this medicine. Severe falls or injuries can cause unseen bleeding. Be careful when using sharp tools or knives. Consider using an Copy. Take special  care brushing or flossing your teeth. Report any injuries, bruising, or red spots on the skin to your healthcare professional. If you are going to need surgery or other procedure, tell your healthcare professional that you are taking this medicine. Wear a medical ID bracelet or chain. Carry a card that describes your disease and details of your medicine and dosage times. What side effects may I notice from receiving this medicine? Side effects that you should report to your doctor or health care professional as soon as possible:  allergic reactions like skin rash, itching or hives, swelling of the face, lips, or tongue  signs and symptoms of bleeding such as bloody or black, tarry stools; red or dark-brown urine; spitting up blood or brown material that looks like coffee grounds; red spots on the skin; unusual bruising or bleeding from the eye, gums, or nose  signs and symptoms of a blood clot such as chest pain; shortness of breath; pain, swelling, or warmth in the leg  signs and symptoms of a stroke such as changes in vision; confusion; trouble speaking or understanding; severe headaches; sudden numbness or weakness of the face, arm or leg; trouble walking; dizziness; loss of coordination This list may not describe all possible side effects. Call your doctor for medical advice about side effects. You may report side effects to FDA at 1-800-FDA-1088. Where should I keep my medicine? Keep out of the reach of children. Store at room temperature between 20 and 25 degrees C (68 and 77 degrees F). Throw away any unused medicine after the expiration date. NOTE: This sheet is a summary. It may not cover all possible information. If you have questions about this medicine, talk to your doctor, pharmacist, or health care provider.  2020 Elsevier/Gold Standard (2017-12-05 17:39:34) Diltiazem Extended-Release Oral Capsules or Tablets What is  this medicine? DILTIAZEM (dil TYE a zem) is a calcium channel  blocker. It relaxes your blood vessels and decreases the amount of work the heart has to do. It treats high blood pressure and/or prevents chest pain (also called angina). This medicine may be used for other purposes; ask your health care provider or pharmacist if you have questions. COMMON BRAND NAME(S): Cardizem CD, Cardizem LA, Cardizem SR, Cartia XT, Dilacor XR, Dilt-CD, Diltia XT, Diltzac, Matzim LA, Rema Fendt, TIADYLT ER, Tiamate, Tiazac What should I tell my health care provider before I take this medicine? They need to know if you have any of these conditions:  heart attack  heart disease  irregular heartbeat or rhythm  low blood pressure  an unusual or allergic reaction to diltiazem, other drugs, foods, dyes, or preservatives  pregnant or trying to get pregnant  breast-feeding How should I use this medicine? Take this drug by mouth. Take it as directed on the prescription label at the same time every day. Do not cut, crush or chew this drug. Swallow the capsules whole. You can take it with or without food. If it upsets your stomach, take it with food. Keep taking it unless your health care provider tells you to stop. Talk to your health care provider about the use of this drug in children. Special care may be needed. Overdosage: If you think you have taken too much of this medicine contact a poison control center or emergency room at once. NOTE: This medicine is only for you. Do not share this medicine with others. What if I miss a dose? If you miss a dose, take it as soon as you can. If it is almost time for your next dose, take only that dose. Do not take double or extra doses. What may interact with this medicine? Do not take this medicine with any of the following medications:  cisapride  hawthorn  pimozide  ranolazine  red yeast rice This medicine may also interact with the following  medications:  buspirone  carbamazepine  cimetidine  cyclosporine  digoxin  local anesthetics or general anesthetics  lovastatin  medicines for anxiety or difficulty sleeping like midazolam and triazolam  medicines for high blood pressure or heart problems  quinidine  rifampin, rifabutin, or rifapentine This list may not describe all possible interactions. Give your health care provider a list of all the medicines, herbs, non-prescription drugs, or dietary supplements you use. Also tell them if you smoke, drink alcohol, or use illegal drugs. Some items may interact with your medicine. What should I watch for while using this medicine? Visit your health care provider for regular checks on your progress. Check your blood pressure as directed. Ask your health care provider what your blood pressure should be. Also, find out when you should contact him or her. Do not treat yourself for coughs, colds, or pain while you are using this drug without asking your health care provider for advice. Some drugs may increase your blood pressure. This drug may cause serious skin reactions. They can happen weeks to months after starting the drug. Contact your health care provider right away if you notice fevers or flu-like symptoms with a rash. The rash may be red or purple and then turn into blisters or peeling of the skin. Or, you might notice a red rash with swelling of the face, lips or lymph nodes in your neck or under your arms. You may get drowsy or dizzy. Do not drive, use machinery, or do  anything that needs mental alertness until you know how this drug affects you. Do not stand up or sit up quickly, especially if you are an older patient. This reduces the risk of dizzy or fainting spells. What side effects may I notice from receiving this medicine? Side effects that you should report to your doctor or health care provider as soon as possible:  allergic reactions (skin rash, itching or hives;  swelling of the face, lips, or tongue)  heart failure (trouble breathing; fast, irregular heartbeat; sudden weight gain; swelling of the ankles, feet, hands; unusually weak or tired)  heartbeat rhythm changes (trouble breathing; chest pain; dizziness; fast, irregular heartbeat; feeling faint or lightheaded, falls)  liver injury (dark yellow or brown urine; general ill feeling or flu-like symptoms; loss of appetite, right upper belly pain; unusually weak or tired, yellowing of the eyes or skin)  redness, blistering, peeling, or loosening of the skin, including inside the mouth Side effects that usually do not require medical attention (report to your doctor or health care provider if they continue or are bothersome):  changes in sex drive or performance  changes in vision  cough  depressed mood  headache  nasal congestion (like runny or stuffy nose)  sudden weight gain  trouble sleeping This list may not describe all possible side effects. Call your doctor for medical advice about side effects. You may report side effects to FDA at 1-800-FDA-1088. Where should I keep my medicine? Keep out of the reach of children and pets. Store at room temperature between 20 and 25 degrees C (68 and 77 degrees F). Protect from moisture. Keep the container tightly closed. Throw away any unused drug after the expiration date. NOTE: This sheet is a summary. It may not cover all possible information. If you have questions about this medicine, talk to your doctor, pharmacist, or health care provider.  2020 Elsevier/Gold Standard (2018-12-17 14:48:13) Metoprolol Extended-Release Capsules What is this medicine? METOPROLOL (me TOE proe lole) is a beta blocker. It decreases the amount of work your heart has to do and helps your heart beat regularly. It treats high blood pressure and/or prevents chest pain (also called angina). It also treats heart failure. This medicine may be used for other purposes; ask  your health care provider or pharmacist if you have questions. COMMON BRAND NAME(S): KAPSPARGO What should I tell my health care provider before I take this medicine? They need to know if you have any of these conditions:  diabetes  heart disease  liver disease  lung or breathing disease, like asthma  pheochromocytoma  thyroid disease  an unusual or allergic reaction to metoprolol, other beta-blockers, medicines, foods, dyes, or preservatives  pregnant or trying to get pregnant  breast-feeding How should I use this medicine? Take this drug by mouth with water. Take it as directed on the prescription label at the same time every day. Do not cut, crush or chew this drug. Swallow the capsules whole. You may open the capsule and put the contents in 1 teaspoon of applesauce. Swallow the drug and applesauce right away. Do not chew the drug or applesauce. Keep taking it unless your health care provider tells you to stop. Talk to your health care provider about the use of this drug in children. While it may be prescribed for children as young as 6 for selected conditions, precautions do apply. Overdosage: If you think you have taken too much of this medicine contact a poison control center or emergency room at  once. NOTE: This medicine is only for you. Do not share this medicine with others. What if I miss a dose? If you miss a dose, take it as soon as you can. If it is almost time for your next dose, take only that dose. Do not take double or extra doses. What may interact with this medicine? This medicine may interact with the following medications:  certain medicines for blood pressure, heart disease, irregular heart beat  epinephrine  fluoxetine  MAOIs like Carbex, Eldepryl, Marplan, Nardil, and Parnate  paroxetine  reserpine This list may not describe all possible interactions. Give your health care provider a list of all the medicines, herbs, non-prescription drugs, or  dietary supplements you use. Also tell them if you smoke, drink alcohol, or use illegal drugs. Some items may interact with your medicine. What should I watch for while using this medicine? You may get drowsy or dizzy. Do not drive, use machinery, or do anything that needs mental alertness until you know how this medicine affects you. Do not stand or sit up quickly, especially if you are an older patient. This reduces the risk of dizzy or fainting spells. Alcohol may interfere with the effect of this medicine. Avoid alcoholic drinks. Visit your doctor or health care professional for regular checks on your progress. Check your blood pressure as directed. Ask your doctor or health care professional what your blood pressure should be and when you should contact him or her. Do not treat yourself for coughs, colds, or pain while you are using this medicine without asking your doctor or health care professional for advice. Some ingredients may increase your blood pressure. This medicine may increase blood sugar. Ask your healthcare provider if changes in diet or medicines are needed if you have diabetes. What side effects may I notice from receiving this medicine? Side effects that you should report to your doctor or health care professional as soon as possible:  allergic reactions like skin rash, itching or hives, swelling of the face, lips, or tongue  cold hands or feet  signs and symptoms of high blood sugar such as being more thirsty or hungry or having to urinate more than normal. You may also feel very tired or have blurry vision.  signs and symptoms of low blood pressure like dizziness; feeling faint or lightheaded, falls; unusually weak or tired  signs of worsening heart failure like breathing problems, swelling in your legs and feet  suicidal thoughts or other mood changes  unusually slow heartbeat Side effects that usually do not require medical attention (report these to your doctor or  health care professional if they continue or are bothersome):  anxious  change in sex drive or performance  diarrhea  headache  trouble sleeping  upset stomach This list may not describe all possible side effects. Call your doctor for medical advice about side effects. You may report side effects to FDA at 1-800-FDA-1088. Where should I keep my medicine? Keep out of the reach of children and pets. Store at room temperature between 20 and 25 degrees C (68 and 77 degrees F). Throw away any unused drug after the expiration date. NOTE: This sheet is a summary. It may not cover all possible information. If you have questions about this medicine, talk to your doctor, pharmacist, or health care provider.  2020 Elsevier/Gold Standard (2018-12-25 13:24:03) Information on my medicine - ELIQUIS (apixaban)  This medication education was reviewed with me or my healthcare representative as part of my discharge  preparation.    Why was Eliquis prescribed for you? Eliquis was prescribed for you to reduce the risk of a blood clot forming that can cause a stroke if you have a medical condition called atrial fibrillation (a type of irregular heartbeat).  What do You need to know about Eliquis ? Take your Eliquis TWICE DAILY - one tablet in the morning and one tablet in the evening with or without food. If you have difficulty swallowing the tablet whole please discuss with your pharmacist how to take the medication safely.  Take Eliquis exactly as prescribed by your doctor and DO NOT stop taking Eliquis without talking to the doctor who prescribed the medication.  Stopping may increase your risk of developing a stroke.  Refill your prescription before you run out.  After discharge, you should have regular check-up appointments with your healthcare provider that is prescribing your Eliquis.  In the future your dose may need to be changed if your kidney function or weight changes by a significant  amount or as you get older.  What do you do if you miss a dose? If you miss a dose, take it as soon as you remember on the same day and resume taking twice daily.  Do not take more than one dose of ELIQUIS at the same time to make up a missed dose.  Important Safety Information A possible side effect of Eliquis is bleeding. You should call your healthcare provider right away if you experience any of the following: ? Bleeding from an injury or your nose that does not stop. ? Unusual colored urine (red or dark brown) or unusual colored stools (red or black). ? Unusual bruising for unknown reasons. ? A serious fall or if you hit your head (even if there is no bleeding).  Some medicines may interact with Eliquis and might increase your risk of bleeding or clotting while on Eliquis. To help avoid this, consult your healthcare provider or pharmacist prior to using any new prescription or non-prescription medications, including herbals, vitamins, non-steroidal anti-inflammatory drugs (NSAIDs) and supplements.  This website has more information on Eliquis (apixaban): http://www.eliquis.com/eliquis/home   =============================================  Atrial Fibrillation    Atrial fibrillation is a type of heartbeat that is irregular or fast. If you have this condition, your heart beats without any order. This makes it hard for your heart to pump blood in a normal way. Atrial fibrillation may come and go, or it may become a long-lasting problem. If this condition is not treated, it can put you at higher risk for stroke, heart failure, and other heart problems.  What are the causes? This condition may be caused by diseases that damage the heart. They include:  High blood pressure.  Heart failure.  Heart valve disease.  Heart surgery. Other causes include:  Diabetes.  Thyroid disease.  Being overweight.  Kidney disease. Sometimes the cause is not known.  What increases the  risk? You are more likely to develop this condition if:  You are older.  You smoke.  You exercise often and very hard.  You have a family history of this condition.  You are a man.  You use drugs.  You drink a lot of alcohol.  You have lung conditions, such as emphysema, pneumonia, or COPD.  You have sleep apnea.   What are the signs or symptoms? Common symptoms of this condition include:  A feeling that your heart is beating very fast.  Chest pain or discomfort.  Feeling short of  breath.  Suddenly feeling light-headed or weak.  Getting tired easily during activity.  Fainting.  Sweating. In some cases, there are no symptoms.  How is this treated? Treatment for this condition depends on underlying conditions and how you feel when you have atrial fibrillation. They include: 1. Medicines to: ? Prevent blood clots. ? Treat heart rate or heart rhythm problems. 2. Using devices, such as a pacemaker, to correct heart rhythm problems. 3. Doing surgery to remove the part of the heart that sends bad signals. 4. Closing an area where clots can form in the heart (left atrial appendage). In some cases, your doctor will treat other underlying conditions.  Follow these instructions at home:  Medicines 1. Take over-the-counter and prescription medicines only as told by your doctor. 2. Do not take any new medicines without first talking to your doctor. 3. If you are taking blood thinners: ? Talk with your doctor before you take any medicines that have aspirin or NSAIDs, such as ibuprofen, in them. ? Take your medicine exactly as told by your doctor. Take it at the same time each day. ? Avoid activities that could hurt or bruise you. Follow instructions about how to prevent falls. ? Wear a bracelet that says you are taking blood thinners. Or, carry a card that lists what medicines you take. Lifestyle          Do not use any products that have nicotine or tobacco in  them. These include cigarettes, e-cigarettes, and chewing tobacco. If you need help quitting, ask your doctor.  Eat heart-healthy foods. Talk with your doctor about the right eating plan for you.  Exercise regularly as told by your doctor.  Do not drink alcohol.  Lose weight if you are overweight.  Do not use drugs, including cannabis.  General instructions  If you have a condition that causes breathing to stop for a short period of time (apnea), treat it as told by your doctor.  Keep a healthy weight. Do not use diet pills unless your doctor says they are safe for you. Diet pills may make heart problems worse.  Keep all follow-up visits as told by your doctor. This is important.  Contact a doctor if:  You notice a change in the speed, rhythm, or strength of your heartbeat.  You are taking a blood-thinning medicine and you get more bruising.  You get tired more easily when you move or exercise.  You have a sudden change in weight.  Get help right away if:    1. You have pain in your chest or your belly (abdomen). 2. You have trouble breathing. 3. You have side effects of blood thinners, such as blood in your vomit, poop (stool), or pee (urine), or bleeding that cannot stop. 4. You have any signs of a stroke. "BE FAST" is an easy way to remember the main warning signs: ? B - Balance. Signs are dizziness, sudden trouble walking, or loss of balance. ? E - Eyes. Signs are trouble seeing or a change in how you see. ? F - Face. Signs are sudden weakness or loss of feeling in the face, or the face or eyelid drooping on one side. ? A - Arms. Signs are weakness or loss of feeling in an arm. This happens suddenly and usually on one side of the body. ? S - Speech. Signs are sudden trouble speaking, slurred speech, or trouble understanding what people say. ? T - Time. Time to call emergency services. Write down  what time symptoms started. 5. You have other signs of a stroke, such  as: ? A sudden, very bad headache with no known cause. ? Feeling like you may vomit (nausea). ? Vomiting. ? A seizure.  These symptoms may be an emergency. Do not wait to see if the symptoms will go away. Get medical help right away. Call your local emergency services (911 in the [place] Do not drive yourself to the hospital. Summary  Atrial fibrillation is a type of heartbeat that is irregular or fast.  You are at higher risk of this condition if you smoke, are older, have diabetes, or are overweight.  Follow your doctor's instructions about medicines, diet, exercise, and follow-up visits.  Get help right away if you have signs or symptoms of a stroke.  Get help right away if you cannot catch your breath, or you have chest pain or discomfort. This information is not intended to replace advice given to you by your health care provider. Make sure you discuss any questions you have with your health care provider. Document Revised: 09/18/2018 Document Reviewed: 09/18/2018 Elsevier Patient Education  [place].   ==============================================  Pulmonary Embolism    A pulmonary embolism (PE) is a sudden blockage or decrease of blood flow in one or both lungs. Most blockages come from a blood clot that forms in the vein of a lower leg, thigh, or arm (deep vein thrombosis, DVT) and travels to the lungs. A clot is blood that has thickened into a gel or solid. PE is a dangerous and life-threatening condition that needs to be treated right away.  What are the causes? This condition is usually caused by a blood clot that forms in a vein and moves to the lungs. In rare cases, it may be caused by air, fat, part of a tumor, or other tissue that moves through the veins and into the lungs.  What increases the risk? The following factors may make you more likely to develop this condition: 5. Experiencing a traumatic injury, such as breaking a hip or leg. 6. Having: ? A  spinal cord injury. ? Orthopedic surgery, especially hip or knee replacement. ? Any major surgery. ? A stroke. ? DVT. ? Blood clots or blood clotting disease. ? Long-term (chronic) lung or heart disease. ? Cancer treated with chemotherapy. ? A central venous catheter. 7. Taking medicines that contain estrogen. These include birth control pills and hormone replacement therapy. 65. Being: ? Pregnant. ? In the period of time after your baby is delivered (postpartum). ? Older than age 7. ? Overweight. ? A smoker, especially if you have other risks.  What are the signs or symptoms? Symptoms of this condition usually start suddenly and include:  Shortness of breath during activity or at rest.  Coughing, coughing up blood, or coughing up blood-tinged mucus.  Chest pain that is often worse with deep breaths.  Rapid or irregular heartbeat.  Feeling light-headed or dizzy.  Fainting.  Feeling anxious.  Fever.  Sweating.  Pain and swelling in a leg. This is a symptom of DVT, which can lead to PE. How is this diagnosed? This condition may be diagnosed based on:  Your medical history.  A physical exam.  Blood tests.  CT pulmonary angiogram. This test checks blood flow in and around your lungs.  Ventilation-perfusion scan, also called a lung VQ scan. This test measures air flow and blood flow to the lungs.  An ultrasound of the legs.  How is this treated?  Treatment for this condition depends on many factors, such as the cause of your PE, your risk for bleeding or developing more clots, and other medical conditions you have. Treatment aims to remove, dissolve, or stop blood clots from forming or growing larger. Treatment may include: 4. Medicines, such as: ? Blood thinning medicines (anticoagulants) to stop clots from forming. ? Medicines that dissolve clots (thrombolytics). 5. Procedures, such as: ? Using a flexible tube to remove a blood clot (embolectomy) or to deliver  medicine to destroy it (catheter-directed thrombolysis). ? Inserting a filter into a large vein that carries blood to the heart (inferior vena cava). This filter (vena cava filter) catches blood clots before they reach the lungs. ? Surgery to remove the clot (surgical embolectomy). This is rare. You may need a combination of immediate, long-term (up to 3 months after diagnosis), and extended (more than 3 months after diagnosis) treatments. Your treatment may continue for several months (maintenance therapy). You and your health care provider will work together to choose the treatment program that is best for you.  Follow these instructions at home: Medicines 6. Take over-the-counter and prescription medicines only as told by your health care provider. 7. If you are taking an anticoagulant medicine: ? Take the medicine every day at the same time each day. ? Understand what foods and drugs interact with your medicine. ? Understand the side effects of this medicine, including excessive bruising or bleeding. Ask your health care provider or pharmacist about other side effects.  General instructions  Wear a medical alert bracelet or carry a medical alert card that says you have had a PE and lists what medicines you take.  Ask your health care provider when you may return to your normal activities. Avoid sitting or lying for a long time without moving.  Maintain a healthy weight. Ask your health care provider what weight is healthy for you.  Do not use any products that contain nicotine or tobacco, such as cigarettes, e-cigarettes, and chewing tobacco. If you need help quitting, ask your health care provider.  Talk with your health care provider about any travel plans. It is important to make sure that you are still able to take your medicine while on trips.  Keep all follow-up visits as told by your health care provider. This is important.  Contact a health care provider if:  You missed a  dose of your blood thinner medicine.  Get help right away if: 1. You have: ? New or increased pain, swelling, warmth, or redness in an arm or leg. ? Numbness or tingling in an arm or leg. ? Shortness of breath during activity or at rest. ? A fever. ? Chest pain. ? A rapid or irregular heartbeat. ? A severe headache. ? Vision changes. ? A serious fall or accident, or you hit your head. ? Stomach (abdominal) pain. ? Blood in your vomit, stool, or urine. ? A cut that will not stop bleeding. 2. You cough up blood. 3. You feel light-headed or dizzy. 4. You cannot move your arms or legs. 5. You are confused or have memory loss.  These symptoms may represent a serious problem that is an emergency. Do not wait to see if the symptoms will go away. Get medical help right away. Call your local emergency services (911 in the [place] Do not drive yourself to the hospital. Summary  A pulmonary embolism (PE) is a sudden blockage or decrease of blood flow in one or both lungs. PE is  a dangerous and life-threatening condition that needs to be treated right away.  Treatments for this condition usually include medicines to thin your blood (anticoagulants) or medicines to break apart blood clots (thrombolytics).  If you are given blood thinners, it is important to take the medicine every day at the same time each day.  Understand what foods and drugs interact with any medicines that you are taking.  If you have signs of PE or DVT, call your local emergency services (911 in the [place] This information is not intended to replace advice given to you by your health care provider. Make sure you discuss any questions you have with your health care provider. Document Revised: 01/02/2018 Document Reviewed: 01/02/2018 Elsevier Patient Education  2020 [employer].

## 2020-02-06 NOTE — Progress Notes (Signed)
Lower extremity venous bilateral study completed.   Please see CV Proc for preliminary results.   Malaka Ruffner, RDMS  

## 2020-02-06 NOTE — ED Notes (Signed)
Attempted to give report and was told not approved yet. The CN said he is looking at it now, they just had a code.

## 2020-02-06 NOTE — Progress Notes (Signed)
ANTICOAGULATION CONSULT NOTE - Follow Up Consult  Pharmacy Consult for heparin Indication: pulmonary embolus  Labs: Recent Labs    02/05/20 1804 02/05/20 2006 02/06/20 0249  HGB 14.2  --  13.3  HCT 44.6  --  41.3  PLT 161  --  156  HEPARINUNFRC  --   --  0.71*  CREATININE 1.71*  --   --   TROPONINIHS 17 24*  --     Assessment/Plan 64yo female slightly supratherapeutic on heparin with initial dosing but lab was drawn just ~4h after bolus. Will continue gtt at current rate for now and check additional level.   Wynona Neat, PharmD, BCPS  02/06/2020,3:54 AM

## 2020-02-06 NOTE — ED Notes (Signed)
Pt A-fib, tachy on monitor

## 2020-02-06 NOTE — ED Notes (Signed)
Pt refuses to use a bedside commode and insists on walking to the bathroom. Pt refuses to keep bp cuff and pulse ox on. Pt refuses to get a second IV

## 2020-02-06 NOTE — Plan of Care (Signed)

## 2020-02-06 NOTE — ED Notes (Signed)
Pt provided pillow at this time.

## 2020-02-06 NOTE — Progress Notes (Signed)
[facility] for heparin>> apixaban Indication: pulmonary embolus  Allergies  Allergen Reactions  . Codeine Nausea Only    Patient Measurements: Height: 5\' 6"  (167.6 cm) Weight: 103.4 kg (228 lb) IBW/kg (Calculated) : 59.3 Heparin Dosing Weight: 82.9kg  Vital Signs: Temp: 98.8 F (37.1 C) (10/29 1513) Temp Source: Oral (10/29 1028) BP: 104/72 (10/29 1513) Pulse Rate: 115 (10/29 1513)  Labs: Recent Labs    02/05/20 1804 02/05/20 2006 02/06/20 0249 02/06/20 0816  HGB 14.2  --  13.3  --   HCT 44.6  --  41.3  --   PLT 161  --  156  --   HEPARINUNFRC  --   --  0.71* 0.60  CREATININE 1.71*  --  1.64*  --   TROPONINIHS 17 24*  --   --     Estimated Creatinine Clearance: 41 mL/min (A) (by C-G formula based on SCr of 1.64 mg/dL (H)).  Assessment: 64 year old W with small chronic PE and afib,  not on anticoagulation PTA. Pt has past history of provoked PE 20 years ago. Pharmacy is consulted to start apixaban.     Last HL therapeutic. Given chronic PE and other more likely causes for her symptoms, will not given apixaban load. ClCr ~40 ml/min.    Goal of Therapy:  Monitor platelets by anticoagulation protocol: Yes   Plan:  Stop heparin Start apixaban 5mg  BID Monitor for signs/symptoms of bleeding    Benetta Spar, PharmD, BCPS, BCCP Clinical Pharmacist  Please check AMION for all [place] phone numbers After 10:00 PM, call [facility] [phone]

## 2020-02-06 NOTE — Progress Notes (Signed)
  Echocardiogram 2D Echocardiogram has been performed.  Bobbye Charleston 02/06/2020, 9:42 AM

## 2020-02-07 DIAGNOSIS — I2699 Other pulmonary embolism without acute cor pulmonale: Secondary | ICD-10-CM | POA: Diagnosis not present

## 2020-02-07 DIAGNOSIS — E039 Hypothyroidism, unspecified: Secondary | ICD-10-CM

## 2020-02-07 DIAGNOSIS — I2782 Chronic pulmonary embolism: Secondary | ICD-10-CM | POA: Diagnosis not present

## 2020-02-07 LAB — CBC
HCT: 41.9 % (ref 36.0–46.0)
Hemoglobin: 13.5 g/dL (ref 12.0–15.0)
MCH: 29.3 pg (ref 26.0–34.0)
MCHC: 32.2 g/dL (ref 30.0–36.0)
MCV: 91.1 fL (ref 80.0–100.0)
Platelets: 158 10*3/uL (ref 150–400)
RBC: 4.6 MIL/uL (ref 3.87–5.11)
RDW: 13.9 % (ref 11.5–15.5)
WBC: 7 10*3/uL (ref 4.0–10.5)
nRBC: 0 % (ref 0.0–0.2)

## 2020-02-07 LAB — LIPID PANEL
Cholesterol: 172 mg/dL (ref 0–200)
HDL: 36 mg/dL — ABNORMAL LOW (ref >40)
LDL Cholesterol: 112 mg/dL — ABNORMAL HIGH (ref 0–99)
Total CHOL/HDL Ratio: 4.8 RATIO
Triglycerides: 118 mg/dL (ref <150)
VLDL: 24 mg/dL (ref 0–40)

## 2020-02-07 MED ORDER — DILTIAZEM HCL ER COATED BEADS 120 MG PO CP24
120.0000 mg | ORAL_CAPSULE | Freq: Every day | ORAL | 0 refills | Status: DC
Start: 2020-02-08 — End: 2020-02-12

## 2020-02-07 MED ORDER — APIXABAN 5 MG PO TABS
5.0000 mg | ORAL_TABLET | Freq: Two times a day (BID) | ORAL | 1 refills | Status: DC
Start: 2020-02-07 — End: 2020-06-29

## 2020-02-07 MED ORDER — METOPROLOL SUCCINATE ER 25 MG PO TB24
25.0000 mg | ORAL_TABLET | Freq: Every day | ORAL | 0 refills | Status: DC
Start: 2020-02-08 — End: 2020-02-18

## 2020-02-07 MED ORDER — EZETIMIBE 10 MG PO TABS
10.0000 mg | ORAL_TABLET | Freq: Every day | ORAL | Status: DC
  Administered 2020-02-07: 10 mg via ORAL
  Filled 2020-02-07: qty 1

## 2020-02-07 NOTE — Consult Note (Signed)
Ref: Shon Baton, MD   Subjective:  Feeling little better. Ambulated well. Heart rate controlled and 60-70 at rest and 100-120 with activity.. Atrial fibrillation continues. For elevated LDL cholesterol of 112 mg patient is willing to take Zetia only. She is planning to lose 20-30 pounds of weight. She is willing to undergo nuclear stress test but not now or this admission.  Objective:  Vital Signs in the last 24 hours: Temp:  [98.1 F (36.7 C)-98.9 F (37.2 C)] 98.6 F (37 C) (10/30 0800) Pulse Rate:  [60-115] 60 (10/30 0800) Cardiac Rhythm: Atrial fibrillation (10/30 0703) Resp:  [18-20] 20 (10/29 2201) BP: (92-137)/(49-93) 92/49 (10/30 0800) SpO2:  [90 %-100 %] 93 % (10/30 0800)  Physical Exam: BP Readings from Last 1 Encounters:  02/07/20 (!) 92/49     Wt Readings from Last 1 Encounters:  02/05/20 103.4 kg    Weight change:  Body mass index is 36.8 kg/m. HEENT: [place]/AT, Eyes-Blue, Conjunctiva-Pink, Sclera-Non-icteric Neck: No JVD, No bruit, Trachea midline. Lungs:  Clear, Bilateral. Cardiac:  Irregular rhythm, normal S1 and S2, no S3. II/VI systolic murmur. Abdomen:  Soft, non-tender. BS present. Extremities:  No edema present. No cyanosis. No clubbing. CNS: AxOx3, Cranial nerves grossly intact, moves all 4 extremities.  Skin: Warm and dry.   Intake/Output from previous day: 10/29 0701 - 10/30 0700 In: 661.7 [P.O.:360; I.V.:301.7] Out: -     Lab Results: BMET    Component Value Date/Time   NA 141 02/06/2020 0249   NA 140 02/05/2020 1804   NA 140 09/26/2018 1307   K 4.4 02/06/2020 0249   K 4.6 02/05/2020 1804   K 4.1 09/26/2018 1307   CL 101 02/06/2020 0249   CL 100 02/05/2020 1804   CL 98 09/26/2018 1307   CO2 28 02/06/2020 0249   CO2 26 02/05/2020 1804   CO2 27 09/26/2018 1307   GLUCOSE 102 (H) 02/06/2020 0249   GLUCOSE 154 (H) 02/05/2020 1804   GLUCOSE 110 (H) 09/26/2018 1307   BUN 38 (H) 02/06/2020 0249   BUN 38 (H) 02/05/2020 1804   BUN 25 (H)  09/26/2018 1307   CREATININE 1.64 (H) 02/06/2020 0249   CREATININE 1.71 (H) 02/05/2020 1804   CREATININE 1.27 (H) 09/26/2018 1307   CALCIUM 9.1 02/06/2020 0249   CALCIUM 9.3 02/05/2020 1804   CALCIUM 9.5 09/26/2018 1307   GFRNONAA 34 (L) 02/06/2020 0249   GFRNONAA 33 (L) 02/05/2020 1804   GFRNONAA 44 (L) 09/26/2018 1307   GFRAA 51 (L) 09/26/2018 1307   CBC    Component Value Date/Time   WBC 7.0 02/07/2020 0138   RBC 4.60 02/07/2020 0138   HGB 13.5 02/07/2020 0138   HCT 41.9 02/07/2020 0138   PLT 158 02/07/2020 0138   MCV 91.1 02/07/2020 0138   MCH 29.3 02/07/2020 0138   MCHC 32.2 02/07/2020 0138   RDW 13.9 02/07/2020 0138   LYMPHSABS 1.1 09/26/2018 1307   MONOABS 0.7 09/26/2018 1307   EOSABS 0.1 09/26/2018 1307   BASOSABS 0.0 09/26/2018 1307   HEPATIC Function Panel No results for input(s): PROT in the last 8760 hours.  Invalid input(s):  ALBUMIN,  AST,  ALT,  ALKPHOS,  BILIDIR,  IBILI HEMOGLOBIN A1C No components found for: HGA1C,  MPG CARDIAC ENZYMES Lab Results  Component Value Date   CKTOTAL 31 (L) 09/26/2018   BNP No results for input(s): PROBNP in the last 8760 hours. TSH Recent Labs    02/06/20 0249  TSH 3.024   CHOLESTEROL Recent  Labs    02/07/20 0138  CHOL 172    Scheduled Meds: . apixaban  5 mg Oral BID  . diltiazem  120 mg Oral Daily  . ezetimibe  10 mg Oral Daily  . levothyroxine  50 mcg Oral Once per day on Sun Tue Thu Sat  . [START ON 02/09/2020] levothyroxine  75 mcg Oral Once per day on Mon Wed Fri  . metoprolol succinate  25 mg Oral Daily   Continuous Infusions: PRN Meds:.acetaminophen **OR** acetaminophen  Assessment/Plan: Syncope Atrial fibrillation CAD Mild to moderate AS Moderate MR Hypothyroidism Obesity Acute kidney injury, stage IIIa  Take Diltiazem 120 mg. In Am and Toprol XL 25 mg. In PM. Resume Zetia. Nuclear stress test on OP basis when patient is ready. Heart healthy diet and activity.   LOS: 1 day   Time  spent including chart review, lab review, examination, discussion with patient and nurse : 30 min   Dixie Dials  MD  02/07/2020, 9:24 AM

## 2020-02-07 NOTE — Care Management (Signed)
Patient given 30 day free Eliquis card.  Patient is arranging transportation home.

## 2020-02-07 NOTE — Plan of Care (Signed)

## 2020-02-07 NOTE — Progress Notes (Signed)
Completed walk with patient. HR went up to the 120's. Immediately back into the 90's when sitting down.   No distress noted.

## 2020-02-09 ENCOUNTER — Other Ambulatory Visit: Payer: Self-pay | Admitting: Internal Medicine

## 2020-02-09 DIAGNOSIS — E785 Hyperlipidemia, unspecified: Secondary | ICD-10-CM

## 2020-02-11 ENCOUNTER — Ambulatory Visit: Payer: Medicare Other | Admitting: Family Medicine

## 2020-02-11 NOTE — Telephone Encounter (Signed)
Appointment scheduled for 11/4 with Dr. Harrell Gave.

## 2020-02-12 ENCOUNTER — Ambulatory Visit: Payer: Medicare Other | Admitting: Cardiology

## 2020-02-12 ENCOUNTER — Other Ambulatory Visit: Payer: Self-pay

## 2020-02-12 ENCOUNTER — Encounter: Payer: Self-pay | Admitting: Cardiology

## 2020-02-12 VITALS — BP 110/72 | HR 111

## 2020-02-12 DIAGNOSIS — I2510 Atherosclerotic heart disease of native coronary artery without angina pectoris: Secondary | ICD-10-CM

## 2020-02-12 DIAGNOSIS — E7800 Pure hypercholesterolemia, unspecified: Secondary | ICD-10-CM | POA: Diagnosis not present

## 2020-02-12 DIAGNOSIS — D6869 Other thrombophilia: Secondary | ICD-10-CM

## 2020-02-12 DIAGNOSIS — I4891 Unspecified atrial fibrillation: Secondary | ICD-10-CM

## 2020-02-12 NOTE — Progress Notes (Signed)
Cardiology Office Note:    Date:  02/12/2020   ID:  Cynthia Cook, DOB November 09, 1955, MRN [id number]  PCP:  Shon Baton, MD  Cardiologist:  No primary care provider on file.  Referring MD: Shon Baton, MD   Chief Complaint  Patient presents with  . Follow-up    History of Present Illness:    Cynthia Cook is a 64 y.o. female with a hx of chest pain and abnormal echo. She has previously been seen by Dr. Margaretann Loveless but requested second opinion regarding management of calcium and CV risk. I met her 06/23/19 to discuss calcium/CV risk, see mychart not 06/30/19.  Today: Recent hospitalization reviewed. Seen by Dr. Doylene Canard in the hospital for syncope and atrial fibrillation. During that admission, she discussed TEE/CV, stress test, statin vs PCSK9i and deferred changes to outpatient. She wished to follow up with me instead of follow up with Dr. Doylene Canard.  Please see mychart message from 02/10/20.   Imaging noted for small chronic pulmonary embolism. CT noted coronary artery calcification, mitral annular calcification, and aortic valve calcification. Echo as below.  Reports prior to admission, she wasn't feeling well for several days. Had generally not felt well, no GI or respiratory symptoms, only mild burning in her upper chest and neck. Was walking out the door to go to a doctor's appt when she lost consciousness. Had about 10 seconds of presyncope, felt like "lights were going out," tried to steady herself but grabbed an unstable chair. Sat down, but hit her head. Thinks she was out about 1-2 minutes. No confusion, knew exactly what had happened. Able to move all extremities after. No nausea, diaphoresis, chest pain, shortness of breath, aura, etc prior to syncope. Had a small amount to eat/drink prior to event. Had been trying to follow keto diet to lose weight. No further syncope.  If she takes both metoprolol and diltiazem, she feels very lightheaded, even if she splits when she takes them. Heart  rates at home vary. During the day, range from 70s-110s. No very fast heart rates. Metoprolol seems to work better than diltiazem. No coffee/tea, only occasional dark chocolate.  Has had no problems with apixaban. Has read all of the drug instructions/inserts. Was on coumadin remotely and had significant issues with bleeding and stable INRs on this.  We discussed cardioversion at length today. Also discussed triggers for atrial fibrillation. Reviewed her echo. She is taking lasix three times/weekly currently.    Past Medical History:  Diagnosis Date  . Back pain   . Murmur   . PE (pulmonary embolism)   . Thyroid disease     History reviewed. No pertinent surgical history.  Current Medications: Current Outpatient Medications on File Prior to Visit  Medication Sig  . apixaban (ELIQUIS) 5 MG TABS tablet Take 1 tablet (5 mg total) by mouth 2 (two) times daily.  . B Complex-C (B-COMPLEX WITH VITAMIN C) tablet Take 1 tablet by mouth daily.  Marland Kitchen co-enzyme Q-10 30 MG capsule Take 30 mg by mouth daily.  Marland Kitchen diltiazem (CARDIZEM CD) 120 MG 24 hr capsule Take 1 capsule (120 mg total) by mouth daily.  Marland Kitchen ezetimibe (ZETIA) 10 MG tablet Take 1 tablet (10 mg total) by mouth daily.  . febuxostat (ULORIC) 40 MG tablet Take 40 mg by mouth daily.  . furosemide (LASIX) 20 MG tablet Take 20 mg by mouth as directed. Take 1 tablet 5 Days A Week  . levothyroxine (SYNTHROID) 50 MCG tablet Take 50-75 mcg by mouth daily. Take 75  mcg (1.5 tablets) on MWF and Take 50 mcg (1 tablet) all other days  . Menaquinone-7 (VITAMIN K2) 100 MCG CAPS Take by mouth.  . metoprolol succinate (TOPROL-XL) 25 MG 24 hr tablet Take 1 tablet (25 mg total) by mouth daily.  . Multiple Vitamins-Minerals (MULTIVITAMIN WITH MINERALS) tablet Take 1 tablet by mouth daily.  . Selenium 100 MCG CAPS Take by mouth.  . vitamin C (ASCORBIC ACID) 250 MG tablet Take 500 mg by mouth daily.   . vitamin E 400 UNIT capsule Take 400 Units by mouth daily.  .  Zinc 30 MG TABS Take by mouth.   No current facility-administered medications on file prior to visit.     Allergies:   Codeine   Social History   Tobacco Use  . Smoking status: Never Smoker  . Smokeless tobacco: Never Used  Vaping Use  . Vaping Use: Never used  Substance Use Topics  . Alcohol use: No  . Drug use: Not on file    Family History: Family history is unknown by patient.  ROS:   Please see the history of present illness.  Additional pertinent ROS otherwise unremarkable.   EKGs/Labs/Other Studies Reviewed:    The following studies were reviewed today: Echo 02/06/20 1. Left ventricular ejection fraction, by estimation, is 55 to 60%. The  left ventricle has normal function. The left ventricle has no regional  wall motion abnormalities. There is mild concentric left ventricular  hypertrophy. Left ventricular diastolic  function could not be evaluated.  2. Moderate calcific mitral stenosis is present. MG 5.6 mmHG @ 109 bpm.  MVA by VTI 1.86 cm2. Mild to moderate MR is present which is related to  restricted movement of the PMVL (IIIB). The mitral valve is degenerative.  Mild to moderate mitral valve  regurgitation. Moderate mitral stenosis. Severe mitral annular  calcification.  3. Mild to moderate AS is present. V max 2.25 m/s, MG 12.3 mmHG, AVA 1.29  cm2, DI 0.34. Gradients lower than expected due to low SVI (SV=56 cc,  SVI=26 cc/m2). The aortic valve is tricuspid. There is moderate  calcification of the aortic valve. There is  moderate thickening of the aortic valve. Aortic valve regurgitation is not  visualized. Mild to moderate aortic valve stenosis. Aortic valve area, by  VTI measures 1.29 cm. Aortic valve mean gradient measures 12.3 mmHg.  Aortic valve Vmax measures 2.25  m/s.  4. Right ventricular systolic function is normal. The right ventricular  size is mildly enlarged. There is mildly elevated pulmonary artery  systolic pressure. The estimated  right ventricular systolic pressure is  59.1 mmHg.  5. Left atrial size was severely dilated.  6. The inferior vena cava is dilated in size with <50% respiratory  variability, suggesting right atrial pressure of 15 mmHg.   Comparison(s): Changes from prior study are noted. Afib with RVR is now  present. Valvular heart disease is stable.   EKG:  EKG is personally reviewed.  The ekg ordered today demonstrates atrial fibrillation with RVR at 111 bpm.  Recent Labs: 02/05/2020: B Natriuretic Peptide 341.7 02/06/2020: BUN 38; Creatinine, Ser 1.64; Potassium 4.4; Sodium 141; TSH 3.024 02/07/2020: Hemoglobin 13.5; Platelets 158  Recent Lipid Panel    Component Value Date/Time   CHOL 172 02/07/2020 0138   TRIG 118 02/07/2020 0138   HDL 36 (L) 02/07/2020 0138   CHOLHDL 4.8 02/07/2020 0138   VLDL 24 02/07/2020 0138   LDLCALC 112 (H) 02/07/2020 0138    Physical Exam:  VS:  BP 110/72 (BP Location: Left Arm, Patient Position: Sitting, Cuff Size: Large)   Pulse (!) 111     Wt Readings from Last 3 Encounters:  02/05/20 228 lb (103.4 kg)  06/23/19 235 lb (106.6 kg)  05/21/19 221 lb (100.2 kg)    GEN: Well nourished, well developed in no acute distress HEENT: Normal, moist mucous membranes NECK: No JVD CARDIAC: tachycardic, irregularly irregular rhythm, normal S1 and S2, no rubs or gallops. 2/6 systolic murmur. VASCULAR: Radial and DP pulses 2+ bilaterally. No carotid bruits RESPIRATORY:  Clear to auscultation without rales, wheezing or rhonchi  ABDOMEN: Soft, non-tender, non-distended MUSCULOSKELETAL:  Ambulates independently SKIN: Warm and dry, trivial LE edema NEUROLOGIC:  Alert and oriented x 3. No focal neuro deficits noted. PSYCHIATRIC:  Normal affect    ASSESSMENT:    1. Atrial fibrillation with RVR ([place])   2. Coronary artery calcification seen on CT scan   3. Pure hypercholesterolemia   4. Secondary hypercoagulable state ([place])    PLAN:    Atrial fibrillation with  RVR -we discussed options at length. After risk/benefit, she is amenable to cardioversion, scheduled for 03/02/20. -she is not rate controlled. She felt very poorly on both diltiazem and metoprolol. We discussed options, will stop diltiazem. She will take metoprolol succinate 25 mg in the AM, can take second dose in PM if HR >100 and SBP >90. -CHA2DS2/VAS Stroke Risk Points= 2, secondary hypercoagulable state -we discussed stroke risk at length. She agrees to take apixaban for at least one month post cardioversion. I recommended long term anticoagulation given her chadsvasc and risk of stroke. She prefers natural alternatives, but we discussed that these have not been proven to prevent stroke.  Hypercholesterolemia, coronary artery calcification: -see extensive prior discussion. Declines statins, willing to try ezetimibe -recommended for cardiac CT given prior chest pain, declines -we do not recommend nor offer chelation therapy -last LDL 112, recommended goal <70  Cardiac risk counseling and prevention recommendations: -recommend heart healthy/Mediterranean diet, with whole grains, fruits, vegetable, fish, lean meats, nuts, and olive oil. Limit salt. -recommend moderate walking, 3-5 times/week for 30-50 minutes each session. Aim for at least 150 minutes.week. Goal should be pace of 3 miles/hours, or walking 1.5 miles in 30 minutes -recommend avoidance of tobacco products. Avoid excess alcohol. -Additional risk factor control:  -Diabetes risk: A1c is 5.7  Plan for follow up: 2 weeks post cardioversion  Buford Dresser, MD, PhD [place]  [facility]    Medication Adjustments/Labs and Tests Ordered: Current medicines are reviewed at length with the patient today.  Concerns regarding medicines are outlined above.  Orders Placed This Encounter  Procedures  . EKG 12-Lead   No orders of the defined types were placed in this encounter.   Patient Instructions  Medication  Instructions:  Take metoprolol today. If your resting heart rate is more than 90 bpm, take a second dose of the metoprolol later in the day. If you feel well on this, you can take a total of metoprolol succinate 50 mg daily (either as a single daily dose or split dose).   Stop taking the diltiazem.  *If you need a refill on your cardiac medications before your next appointment, please call your pharmacy*   Lab Work: None   Testing/Procedures: [facility] will need to have the coronavirus test completed prior to your procedure. An appointment has been made at 9:35 am on Friday 02/27/20. This is a Drive Up Visit at  [address], [place], [place] [address]. Please tell them that you are there for procedure testing. Stay in your car and someone will be with you shortly. Please make sure to have all other labs completed before this test because you will need to stay quarantined until your procedure. Follow-Up: At [facility], you and your health needs are our priority.  As part of our continuing mission to provide you with exceptional heart care, we have created designated Provider Care Teams.  These Care Teams include your primary Cardiologist (physician) and Advanced Practice Providers (APPs -  Physician Assistants and Nurse Practitioners) who all work together to provide you with the care you need, when you need it.  We recommend signing up for the patient portal called "MyChart".  Sign up information is provided on this After Visit Summary.  MyChart is used to connect with patients for Virtual Visits (Telemedicine).  Patients are able to view lab/test results, encounter notes, upcoming appointments, etc.  Non-urgent messages can be sent to your provider as well.   To learn more about what you can do with MyChart, go to [email or web address].    Your next appointment:   2 weeks post cardioversion on 03/02/20  The format for your next appointment:   In  Person  Provider:   Buford Dresser, MD   You are scheduled for a  Cardioversion on Tuesday 03/02/20 with Dr. Harrell Gave.  Please arrive at the [facility] (Main Entrance A) at [facility]: [address] [place], [place] [address] at 9 am  DIET: Nothing to eat or drink after midnight except a sip of water with medications (see medication instructions below)  Medication Instructions:  Continue your anticoagulant: Eliquis You will need to continue your anticoagulant after your procedure until you  are told by your  Provider that it is safe to stop   Labs: Had recent lab work on 02/06/20  You must have a responsible person to drive you home and stay in the waiting area during your procedure. Failure to do so could result in cancellation.  Bring your insurance cards.  *Special Note: Every effort is made to have your procedure done on time. Occasionally there are emergencies that occur at the hospital that may cause delays. Please be patient if a delay does occur.     Signed, Buford Dresser, MD PhD 02/12/2020    [facility]

## 2020-02-12 NOTE — Patient Instructions (Addendum)
Medication Instructions:  Take metoprolol today. If your resting heart rate is more than 90 bpm, take a second dose of the metoprolol later in the day. If you feel well on this, you can take a total of metoprolol succinate 50 mg daily (either as a single daily dose or split dose).   Stop taking the diltiazem.  *If you need a refill on your cardiac medications before your next appointment, please call your pharmacy*   Lab Work: None   Testing/Procedures: [facility] will need to have the coronavirus test completed prior to your procedure. An appointment has been made at 9:35 am on Friday 02/27/20. This is a Drive Up Visit at [address], [place], [place] [address]. Please tell them that you are there for procedure testing. Stay in your car and someone will be with you shortly. Please make sure to have all other labs completed before this test because you will need to stay quarantined until your procedure. Follow-Up: At [facility], you and your health needs are our priority.  As part of our continuing mission to provide you with exceptional heart care, we have created designated Provider Care Teams.  These Care Teams include your primary Cardiologist (physician) and Advanced Practice Providers (APPs -  Physician Assistants and Nurse Practitioners) who all work together to provide you with the care you need, when you need it.  We recommend signing up for the patient portal called "MyChart".  Sign up information is provided on this After Visit Summary.  MyChart is used to connect with patients for Virtual Visits (Telemedicine).  Patients are able to view lab/test results, encounter notes, upcoming appointments, etc.  Non-urgent messages can be sent to your provider as well.   To learn more about what you can do with MyChart, go to [email or web address].    Your next appointment:   2 weeks post cardioversion on 03/02/20  The format for your next  appointment:   In Person  Provider:   Buford Dresser, MD   You are scheduled for a  Cardioversion on Tuesday 03/02/20 with Dr. Harrell Gave.  Please arrive at the [facility] (Main Entrance A) at [facility]: [address] [place], [place] [address] at 9 am  DIET: Nothing to eat or drink after midnight except a sip of water with medications (see medication instructions below)  Medication Instructions:  Continue your anticoagulant: Eliquis You will need to continue your anticoagulant after your procedure until you  are told by your  Provider that it is safe to stop   Labs: Had recent lab work on 02/06/20  You must have a responsible person to drive you home and stay in the waiting area during your procedure. Failure to do so could result in cancellation.  Bring your insurance cards.  *Special Note: Every effort is made to have your procedure done on time. Occasionally there are emergencies that occur at the hospital that may cause delays. Please be patient if a delay does occur.

## 2020-02-17 ENCOUNTER — Encounter: Payer: Self-pay | Admitting: Family Medicine

## 2020-02-17 ENCOUNTER — Other Ambulatory Visit: Payer: Self-pay

## 2020-02-17 ENCOUNTER — Ambulatory Visit (INDEPENDENT_AMBULATORY_CARE_PROVIDER_SITE_OTHER): Payer: Medicare Other | Admitting: Family Medicine

## 2020-02-17 VITALS — BP 110/71 | HR 85 | Ht 66.0 in | Wt 228.0 lb

## 2020-02-17 DIAGNOSIS — I480 Paroxysmal atrial fibrillation: Secondary | ICD-10-CM | POA: Insufficient documentation

## 2020-02-17 DIAGNOSIS — N1832 Chronic kidney disease, stage 3b: Secondary | ICD-10-CM

## 2020-02-17 DIAGNOSIS — I2782 Chronic pulmonary embolism: Secondary | ICD-10-CM | POA: Diagnosis not present

## 2020-02-17 DIAGNOSIS — I4891 Unspecified atrial fibrillation: Secondary | ICD-10-CM | POA: Insufficient documentation

## 2020-02-17 DIAGNOSIS — E039 Hypothyroidism, unspecified: Secondary | ICD-10-CM | POA: Diagnosis not present

## 2020-02-17 DIAGNOSIS — E6609 Other obesity due to excess calories: Secondary | ICD-10-CM | POA: Insufficient documentation

## 2020-02-17 DIAGNOSIS — Z6836 Body mass index (BMI) 36.0-36.9, adult: Secondary | ICD-10-CM

## 2020-02-17 NOTE — Progress Notes (Signed)
Office Visit Note   Patient: Cynthia Cook           Date of Birth: 11-Aug-1955           MRN: [id number] Visit Date: 02/17/2020 Requested by: Shon Baton, [place],  [place] [address] PCP: Shon Baton, MD  Subjective: Chief Complaint  Patient presents with   functional medicine consult    HPI: She is here to establish care.  She is interested in a functional medicine approach.  She states that she has had a lot of trouble losing weight.  Originally she weighed 400 pounds and was able to lose about 160 pounds pretty easily, but she seems to have reached a plateau.  She eats a fairly healthy diet.  Denies any gastrointestinal symptoms.  She does struggle with fatigue, especially recently with her diagnosis of atrial fibrillation.  From an atrial fibrillation standpoint, she will be undergoing ablation later this month.  She was diagnosed with hypothyroidism many years ago, Hashimoto's.  She has been treated with Synthroid and also compounded thyroid hormone, but never really felt like any of them made much difference for her.  Her most recent TSH was around 3.  She had a DVT years ago after traveling, with subsequent pulmonary embolus.  Her labs last summer 2020, were notable for elevated creatinine.  Her recent labs showed creatinine was even worse with a GFR of 34.  She was not aware of this.               ROS:   All other systems were reviewed and are negative.  Objective: Vital Signs: BP 110/71    Pulse 85    Ht 5\' 6"  (1.676 m)    Wt 228 lb (103.4 kg)    BMI 36.80 kg/m   Physical Exam:  General:  Alert and oriented, in no acute distress. Pulm:  Breathing unlabored. Psy:  Normal mood, congruent affect. Skin: No rash or suspicious lesions. HEENT:  [place]/AT, PERRLA, EOM Full, no nystagmus.  Funduscopic examination within normal limits.  No conjunctival erythema.  Tympanic membranes are pearly gray with normal landmarks.  External ear canals are normal.  Nasal  passages are clear.  Oropharynx is clear.  No significant lymphadenopathy.  No thyromegaly or nodules.  2+ carotid pulses without bruits. CV: Irregularly irregular without murmurs, rubs, or gallops.  No peripheral edema.  2+ radial and posterior tibial pulses. Lungs: Clear to auscultation throughout with no wheezing or areas of consolidation. Abd: Protuberant.  Bowel sounds are active, no hepatosplenomegaly or masses.  Soft and nontender.  No audible bruits.  No evidence of ascites. Extremities: 2+ upper and lower DTRs.  No nail deformities.    Imaging: No results found.  Assessment & Plan: 1.  Obesity, mostly abdominal -We will draw some basic labs today.  If unrevealing, she will try an elimination diet for a month.  If that does not make any noticeable difference, then consider salivary hormone testing.  2.  Hypothyroidism -Recheck antibodies today.  Consider gluten and dairy free diet.  3.  Chronic kidney disease-recheck levels today.  Monitor periodically.  If worsens, nephrology consult.  4.  Atrial fibrillation, managed by cardiology.      Procedures: No procedures performed  No notes on file     PMFS History: Patient Active Problem List   Diagnosis Date Noted   Atrial fibrillation ([place]) 02/17/2020   Class 2 obesity due to excess calories without serious comorbidity with body mass index (  BMI) of 36.0 to 36.9 in adult 02/17/2020   Hypothyroidism 02/06/2020   Pulmonary emboli ([place]) 02/05/2020   ARF (acute renal failure) ([place]) 02/05/2020   Past Medical History:  Diagnosis Date   Back pain    Murmur    PE (pulmonary embolism)    Thyroid disease     Family History  Family history unknown: Yes    History reviewed. No pertinent surgical history. Social History   Occupational History   Not on file  Tobacco Use   Smoking status: Never Smoker   Smokeless tobacco: Never Used  Vaping Use   Vaping Use: Never used  Substance and Sexual Activity    Alcohol use: No   Drug use: Not on file   Sexual activity: Not on file

## 2020-02-18 ENCOUNTER — Telehealth: Payer: Self-pay | Admitting: Family Medicine

## 2020-02-18 ENCOUNTER — Emergency Department (HOSPITAL_COMMUNITY): Payer: Medicare Other

## 2020-02-18 ENCOUNTER — Encounter (HOSPITAL_COMMUNITY): Payer: Self-pay

## 2020-02-18 ENCOUNTER — Other Ambulatory Visit: Payer: Self-pay

## 2020-02-18 ENCOUNTER — Emergency Department (HOSPITAL_COMMUNITY)
Admission: EM | Admit: 2020-02-18 | Discharge: 2020-02-19 | Disposition: A | Discharge status: Home / Self Care | Payer: Medicare Other | Attending: Emergency Medicine | Admitting: Emergency Medicine

## 2020-02-18 ENCOUNTER — Encounter: Payer: Self-pay | Admitting: Family Medicine

## 2020-02-18 DIAGNOSIS — N1832 Chronic kidney disease, stage 3b: Secondary | ICD-10-CM

## 2020-02-18 DIAGNOSIS — I959 Hypotension, unspecified: Secondary | ICD-10-CM | POA: Diagnosis not present

## 2020-02-18 DIAGNOSIS — Z79899 Other long term (current) drug therapy: Secondary | ICD-10-CM | POA: Diagnosis not present

## 2020-02-18 DIAGNOSIS — E039 Hypothyroidism, unspecified: Secondary | ICD-10-CM

## 2020-02-18 DIAGNOSIS — M25522 Pain in left elbow: Secondary | ICD-10-CM | POA: Diagnosis not present

## 2020-02-18 DIAGNOSIS — Z7901 Long term (current) use of anticoagulants: Secondary | ICD-10-CM | POA: Insufficient documentation

## 2020-02-18 DIAGNOSIS — R55 Syncope and collapse: Secondary | ICD-10-CM

## 2020-02-18 DIAGNOSIS — E6609 Other obesity due to excess calories: Secondary | ICD-10-CM

## 2020-02-18 LAB — CBC WITH DIFFERENTIAL/PLATELET
Abs Immature Granulocytes: 0.04 10*3/uL (ref 0.00–0.07)
Basophils Absolute: 0.1 10*3/uL (ref 0.0–0.1)
Basophils Relative: 1 %
Eosinophils Absolute: 0.2 10*3/uL (ref 0.0–0.5)
Eosinophils Relative: 3 %
HCT: 40.4 % (ref 36.0–46.0)
Hemoglobin: 13 g/dL (ref 12.0–15.0)
Immature Granulocytes: 1 %
Lymphocytes Relative: 16 %
Lymphs Abs: 1.2 10*3/uL (ref 0.7–4.0)
MCH: 29.2 pg (ref 26.0–34.0)
MCHC: 32.2 g/dL (ref 30.0–36.0)
MCV: 90.8 fL (ref 80.0–100.0)
Monocytes Absolute: 0.4 10*3/uL (ref 0.1–1.0)
Monocytes Relative: 6 %
Neutro Abs: 5.2 10*3/uL (ref 1.7–7.7)
Neutrophils Relative %: 73 %
Platelets: 152 10*3/uL (ref 150–400)
RBC: 4.45 MIL/uL (ref 3.87–5.11)
RDW: 13.6 % (ref 11.5–15.5)
WBC: 7.1 10*3/uL (ref 4.0–10.5)
nRBC: 0 % (ref 0.0–0.2)

## 2020-02-18 LAB — COMPREHENSIVE METABOLIC PANEL
ALT: 17 U/L (ref 0–44)
AST: 26 U/L (ref 15–41)
Albumin: 3.4 g/dL — ABNORMAL LOW (ref 3.5–5.0)
Alkaline Phosphatase: 67 U/L (ref 38–126)
Anion gap: 12 (ref 5–15)
BUN: 30 mg/dL — ABNORMAL HIGH (ref 8–23)
CO2: 27 mmol/L (ref 22–32)
Calcium: 9 mg/dL (ref 8.9–10.3)
Chloride: 102 mmol/L (ref 98–111)
Creatinine, Ser: 1.51 mg/dL — ABNORMAL HIGH (ref 0.44–1.00)
GFR, Estimated: 38 mL/min — ABNORMAL LOW (ref >60)
Glucose, Bld: 175 mg/dL — ABNORMAL HIGH (ref 70–99)
Potassium: 4.1 mmol/L (ref 3.5–5.1)
Sodium: 141 mmol/L (ref 135–145)
Total Bilirubin: 1.1 mg/dL (ref 0.3–1.2)
Total Protein: 6.3 g/dL — ABNORMAL LOW (ref 6.5–8.1)

## 2020-02-18 LAB — TROPONIN I (HIGH SENSITIVITY)
Troponin I (High Sensitivity): 14 ng/L (ref <18)
Troponin I (High Sensitivity): 19 ng/L — ABNORMAL HIGH (ref <18)

## 2020-02-18 LAB — CBG MONITORING, ED: Glucose-Capillary: 142 mg/dL — ABNORMAL HIGH (ref 70–99)

## 2020-02-18 MED ORDER — FENTANYL CITRATE (PF) 100 MCG/2ML IJ SOLN
25.0000 ug | Freq: Once | INTRAMUSCULAR | Status: AC
  Administered 2020-02-18: 25 ug via INTRAVENOUS
  Filled 2020-02-18: qty 2

## 2020-02-18 MED ORDER — ACETAMINOPHEN 325 MG PO TABS
650.0000 mg | ORAL_TABLET | Freq: Once | ORAL | Status: AC
  Administered 2020-02-18: 650 mg via ORAL
  Filled 2020-02-18: qty 2

## 2020-02-18 MED ORDER — ONDANSETRON HCL 4 MG/2ML IJ SOLN
4.0000 mg | Freq: Once | INTRAMUSCULAR | Status: AC
  Administered 2020-02-18: 4 mg via INTRAVENOUS
  Filled 2020-02-18: qty 2

## 2020-02-18 MED ORDER — SODIUM CHLORIDE 0.9 % IV BOLUS: 1000.0000 mL | Freq: Once | INTRAVENOUS | Status: DC

## 2020-02-18 MED ORDER — SODIUM CHLORIDE 0.9 % IV BOLUS
1000.0000 mL | Freq: Once | INTRAVENOUS | Status: AC
  Administered 2020-02-18: 1000 mL via INTRAVENOUS

## 2020-02-18 MED ORDER — SODIUM CHLORIDE 0.9 % IV BOLUS
500.0000 mL | Freq: Once | INTRAVENOUS | Status: AC
  Administered 2020-02-18: 500 mL via INTRAVENOUS

## 2020-02-18 MED ORDER — METOPROLOL SUCCINATE ER 25 MG PO TB24: 12.5000 mg | ORAL_TABLET | Freq: Every day | ORAL | 0 refills | Status: DC

## 2020-02-18 NOTE — ED Notes (Signed)
Pt transported to XRAY °

## 2020-02-18 NOTE — Telephone Encounter (Signed)
Labs are notable for the following:  Thyroid antibodies are significantly elevated at 249.  3 and T4 levels look good.  Creatinine is still elevated at 1.52, but is better than it was last time.  I recommend rechecking this in about 2 months.  Homocysteine and CRP levels are still pending.

## 2020-02-18 NOTE — ED Provider Notes (Addendum)
[facility] EMERGENCY DEPARTMENT Provider Note   CSN: [id number] Arrival date & time: 02/18/20  1846     History Chief Complaint  Patient presents with  . Near Syncope  . Back Pain    Cynthia Cook is a 64 y.o. female.  HPI   64 year old female with a history of chronic back pain, heart murmur, PE, thyroid disease, who presents to the emergency department today for evaluation of a syncopal episode.  Patient states she had just taken her dog to the park and was walking from her car into her apartment complex when she felt lightheaded.  She lowered herself onto the ground and was in a sitting position when she had a syncopal episode.  States she leaned over to her side but is adamant that she did not hit her head.  She is complaining of pain to her left elbow neck and lower back.  She does have chronic lower back pain but states since she was admitted last month it has been worse.  She denies any numbness of her lower extremities.  Since the episode she has felt nauseated and has had several episodes of vomiting.  She denies any overt abdominal pain.  She denies any chest pain.  She does state that she has had trouble catching her breath for the last several hours but denies any overt shortness of breath.  She states that all she has had to eat today some peanut butter and crackers.  She states she thinks she has dumping syndrome because this has happened to her a few times in the past after she has eaten.  Past Medical History:  Diagnosis Date  . Back pain   . Murmur   . PE (pulmonary embolism)   . Thyroid disease     Patient Active Problem List   Diagnosis Date Noted  . Atrial fibrillation ([place]) 02/17/2020  . Class 2 obesity due to excess calories without serious comorbidity with body mass index (BMI) of 36.0 to 36.9 in adult 02/17/2020  . Hypothyroidism 02/06/2020  . Pulmonary emboli ([place]) 02/05/2020  . ARF (acute renal failure) ([place]) 02/05/2020    History  reviewed. No pertinent surgical history.   OB History   No obstetric history on file.     Family History  Family history unknown: Yes    Social History   Tobacco Use  . Smoking status: Never Smoker  . Smokeless tobacco: Never Used  Vaping Use  . Vaping Use: Never used  Substance Use Topics  . Alcohol use: No  . Drug use: Not on file    Home Medications Prior to Admission medications   Medication Sig Start Date End Date Taking? Authorizing Provider  apixaban (ELIQUIS) 5 MG TABS tablet Take 1 tablet (5 mg total) by mouth 2 (two) times daily. 02/07/20   Domenic Polite, MD  B Complex-C (B-COMPLEX WITH VITAMIN C) tablet Take 1 tablet by mouth daily.    [provider]  co-enzyme Q-10 30 MG capsule Take 30 mg by mouth daily.    [provider]  febuxostat (ULORIC) 40 MG tablet Take 40 mg by mouth daily. 02/02/20   [provider]  furosemide (LASIX) 20 MG tablet Take 20 mg by mouth as directed. Take 1 tablet 5 Days A Week 11/07/19   [provider]  levothyroxine (SYNTHROID) 50 MCG tablet Take 50-75 mcg by mouth daily. Take 75 mcg (1.5 tablets) on MWF and Take 50 mcg (1 tablet) all other days 09/11/18  [provider]  metoprolol succinate (TOPROL-XL) 25 MG 24 hr tablet Take 0.5 tablets (12.5 mg total) by mouth daily. 02/18/20 03/19/20  Coalton Arch S, PA-C  Multiple Vitamins-Minerals (MULTIVITAMIN WITH MINERALS) tablet Take 1 tablet by mouth daily.    [provider]  Selenium 100 MCG CAPS Take by mouth.    [provider]  triamcinolone cream (KENALOG) 0.1 % SMARTSIG:1 Application Topical 2-3 Times Daily 02/09/20   [provider]  vitamin C (ASCORBIC ACID) 250 MG tablet Take 500 mg by mouth daily.     [provider]  Vitamin D-Vitamin K (VITAMIN K2-VITAMIN D3 PO) Take by mouth daily.    [provider]  vitamin E 400 UNIT capsule Take 400 Units by mouth daily.    [provider]    Zinc 30 MG TABS Take by mouth.    [provider]    Allergies    Codeine  Review of Systems   Review of Systems  Constitutional: Negative for chills and fever.  HENT: Negative for sore throat.   Eyes: Negative for visual disturbance.  Respiratory: Negative for cough and shortness of breath.        Can't get a full breath in  Cardiovascular: Negative for chest pain.  Gastrointestinal: Positive for nausea and vomiting. Negative for abdominal pain.  Genitourinary: Negative for dysuria and hematuria.  Musculoskeletal: Positive for back pain and neck pain.  Skin: Negative for rash.  Neurological: Positive for syncope and light-headedness. Negative for seizures.       Denies head trauma  All other systems reviewed and are negative.   Physical Exam Updated Vital Signs BP 136/60 (BP Location: Right Arm)   Pulse 88   Temp 98.6 F (37 C) (Oral)   Resp 16   Ht 5\' 6"  (1.676 m)   Wt 103.4 kg   SpO2 97%   BMI 36.79 kg/m   Physical Exam Vitals and nursing note reviewed.  Constitutional:      General: She is not in acute distress.    Appearance: She is well-developed.  HENT:     Head: Normocephalic and atraumatic.  Eyes:     Extraocular Movements: Extraocular movements intact.     Conjunctiva/sclera: Conjunctivae normal.     Pupils: Pupils are equal, round, and reactive to light.  Cardiovascular:     Rate and Rhythm: Normal rate and regular rhythm.     Heart sounds: Normal heart sounds. No murmur heard.   Pulmonary:     Effort: Pulmonary effort is normal. No respiratory distress.     Breath sounds: Normal breath sounds. No wheezing, rhonchi or rales.  Abdominal:     Palpations: Abdomen is soft.     Tenderness: There is no abdominal tenderness.  Musculoskeletal:     Cervical back: Neck supple.     Comments: TTP to the midline cervical spine, lumbar/sacral spine. TTP to the left elbow over the lateral epicondyle with superficial abrasion noted.  Skin:     General: Skin is warm and dry.  Neurological:     Mental Status: She is alert.     Comments: Mental Status:  Alert, thought content appropriate, able to give a coherent history. Speech fluent without evidence of aphasia. Able to follow 2 step commands without difficulty.  Cranial Nerves:  II:  Peripheral visual fields grossly normal, pupils equal, round, reactive to light III,IV, VI: ptosis not present, extra-ocular motions intact bilaterally  V,VII: smile symmetric, facial light touch sensation equal VIII: hearing  grossly normal to voice  X: uvula elevates symmetrically  XI: bilateral shoulder shrug symmetric and strong XII: midline tongue extension without fassiculations Motor:  Normal tone. Strength symmetric to bue/ble. Decreased strength to ble (suspect secondary to effort 2/2 pain) Sensory: light touch normal in all extremities.      ED Results / Procedures / Treatments   Labs (all labs ordered are listed, but only abnormal results are displayed) Labs Reviewed  COMPREHENSIVE METABOLIC PANEL - Abnormal; Notable for the following components:      Result Value   Glucose, Bld 175 (*)    BUN 30 (*)    Creatinine, Ser 1.51 (*)    Total Protein 6.3 (*)    Albumin 3.4 (*)    GFR, Estimated 38 (*)    All other components within normal limits  CBG MONITORING, ED - Abnormal; Notable for the following components:   Glucose-Capillary 142 (*)    All other components within normal limits  TROPONIN I (HIGH SENSITIVITY) - Abnormal; Notable for the following components:   Troponin I (High Sensitivity) 19 (*)    All other components within normal limits  CBC WITH DIFFERENTIAL/PLATELET  TROPONIN I (HIGH SENSITIVITY)    EKG EKG Interpretation  Date/Time:  Wednesday February 18 2020 19:32:47 EST Ventricular Rate:  104 PR Interval:    QRS Duration: 96 QT Interval:  378 QTC Calculation: 497 R Axis:   83 Text Interpretation: Atrial fibrillation with rapid ventricular response  Abnormal ECG No significant change since last tracing Confirmed by Isla Pence [phone]) on 02/18/2020 8:40:15 PM   Radiology DG Sacrum/Coccyx  Result Date: 02/18/2020 CLINICAL DATA:  Fall EXAM: SACRUM AND COCCYX - 2+ VIEW COMPARISON:  None. FINDINGS: Coccyx not visible due to beam attenuation by soft tissue. No sacral fracture. Femoral heads are located. No pelvic fracture. IMPRESSION: Coccyx not visible due to beam attenuation by soft tissue. No pelvic fracture. Electronically Signed   By: Ulyses Jarred M.D.   On: 02/18/2020 23:36   DG Elbow Complete Left  Result Date: 02/18/2020 CLINICAL DATA:  Fall EXAM: LEFT ELBOW - COMPLETE 3+ VIEW COMPARISON:  None. FINDINGS: There is no evidence of fracture, dislocation. A small elbow joint effusion is seen. Posterior olecranon enthesophyte is seen. There is diffuse osteopenia. Soft tissues are unremarkable. IMPRESSION: No definite displaced fracture seen. Small elbow joint effusion. If pain persists, would recommend cross-sectional imaging for further evaluation. Electronically Signed   By: Prudencio Pair M.D.   On: 02/18/2020 21:49    Procedures Procedures (including critical care time)  Medications Ordered in ED Medications  acetaminophen (TYLENOL) tablet 650 mg (650 mg Oral Given 02/18/20 1947)  sodium chloride 0.9 % bolus 1,000 mL (0 mLs Intravenous Stopped 02/18/20 2030)  ondansetron (ZOFRAN) injection 4 mg (4 mg Intravenous Given 02/18/20 2018)  sodium chloride 0.9 % bolus 500 mL (0 mLs Intravenous Stopped 02/18/20 2243)  fentaNYL (SUBLIMAZE) injection 25 mcg (25 mcg Intravenous Given 02/18/20 2253)  ondansetron (ZOFRAN) injection 4 mg (4 mg Intravenous Given 02/18/20 2334)    ED Course  I have reviewed the triage vital signs and the nursing notes.  Pertinent labs & imaging results that were available during my care of the patient were reviewed by me and considered in my medical decision making (see chart for details).    MDM  Rules/Calculators/A&P                          63 y/o F presenting  for eval of syncope. On arrival pt BP low. VS otherwise reassuring.   Reviewed/interpreted labs CBC unremarkable CMP with elevated BUN/Cr consistent with baseline [place] tro[place] marginally elevated, pt w/o chest pain and no ischemia noted on EKG. Very low suspicion for ACS.   EKG with afib with elevated HR at 104  Xray left elbow reviewed/interpreted - No definite displaced fracture seen. Small elbow joint effusion. If pain persists, would recommend cross-sectional imaging for further evaluation  - low suspicion for fracture today but will place in sling and  Have her f/u with ortho  Xray sacrum coccyx shows no obvious fracture.   7:10 PM Pt initially asked me not to repeat any w/u that was completed during her visit on 10/28. I explained that she is here for a new issue today so in order to treat her we would need further w/u and pt stated, "Do whatever you need to do". She later declined imaging of the head, cervical spine, chest, and lower back.   8:11 PM Pt refusing orthostatic VS.   9:54 PM CONSULT With Dr. Herold Harms with cardiology. Decrease to 12.5mg . Check Bps at home and chart them.  Pt was given ivf, zofran and tylenol in the Ed. On reassessment she states she feels improved and no longer feels lightheaded. Orthostatics did show a drop in BP which I suspect is the cause of her syncope today. She was provided with further IVF resuscitation and felt improved following this.   Pt vomited and feeling near syncopal after receiving fentanyl. zofran given, pt po challenged following this. Attempted to ambulate pt but she states she wants to wait for the fentanyl to wear off a bit because she feels lightheaded. She communicated to nursing staff that she would benefit from a wheelchair at home. Will consult TOC and place face to face in chart.   At shift change, care transitioned to [employer], [place]. Pt will need to be  ambulated. If she can ambulated and feels well she is felt to be appropriate for discharge.   Final Clinical Impression(s) / ED Diagnoses Final diagnoses:  Syncope, unspecified syncope type  Hypotension, unspecified hypotension type    Rx / DC Orders ED Discharge Orders         [facility]        02/19/20 0005    Face-to-face encounter (required for Medicare/Medicaid patients)       Comments: I [facility] certify that this patient is under my care and that I, or a nurse practitioner or physician's assistant working with me, had a face-to-face encounter that meets the physician face-to-face encounter requirements with this patient on 02/19/2020. The encounter with the patient was in whole, or in part for the following medical condition(s) which is the primary reason for home health care (List medical condition): has chronic back pain and lightheadedness which impairs ability to walk safely for long distances   02/19/20 0005    metoprolol succinate (TOPROL-XL) 25 MG 24 hr tablet  Daily        02/18/20 [address], Trai Ells S, PA-C 02/18/20 2357    Michaelanthony Kempton S, PA-C 02/19/20 0006    Isla Pence, MD 02/21/20 [phone]

## 2020-02-18 NOTE — Progress Notes (Signed)
Orthopedic Tech Progress Note Patient Details:  Cynthia Cook 06-Apr-1956 [id number]  Ortho Devices Type of Ortho Device: Arm sling Ortho Device/Splint Location: LUE Ortho Device/Splint Interventions: Application, Adjustment   Post Interventions Patient Tolerated: Well Instructions Provided: Adjustment of device   [name] 02/18/2020, 11:59 PM

## 2020-02-18 NOTE — ED Notes (Signed)
Pt currently at this time is refusing to be placed into a hospital gown for better assessment and refusing to have orthostatic VS completed.

## 2020-02-18 NOTE — Discharge Instructions (Addendum)
Please stop taking 25 mg of metoprolol. You should start taking 12.5 mg of metoprolol instead.   Monitor your blood pressures at home and please make an appointment with your cardiologist within the next 5-7 days for reassessment.   Additionally, the xray of your elbow did not show an obvious fracture but it was also recommended that if you have continued pain then you may need to undergo further imaging. You were given information to follow up with an orthopedic doctor in regards to this. Please call the office the schedule a follow up appointment.  We have placed a consultation to our social work team to help facilitate getting you a wheelchair. They will be reaching out to you in regards to this and will likely be able to set up an appointment with a physical therapist to determine your need for a wheelchair or other assist device  If you have any new or worsening symptoms in the meantime please return to the emergency department immediately.

## 2020-02-18 NOTE — ED Notes (Signed)
Ortho Technician Consulted

## 2020-02-18 NOTE — ED Triage Notes (Signed)
Pt BIB [facility] from home with multiple complaints.  Per EMS, pt was ambulating at home and felt "sick". Pt sat down and then had a syncopal episode. Pt arose and began having Lower Back Pain  Per Pt however, Pt was outside, ambulating from her car to her apartment. Pt then began to feel sick and lightheaded. Pt eased herself onto the ground and managed to get to her apartment door.   4 mg IV Zofran given by EMS for N/V  VS with EMS: HR 100-130 BP 120/70 CBG 145

## 2020-02-19 MED ORDER — ACETAMINOPHEN 325 MG PO TABS
650.0000 mg | ORAL_TABLET | Freq: Once | ORAL | Status: AC
  Administered 2020-02-19: 650 mg via ORAL
  Filled 2020-02-19: qty 2

## 2020-02-19 NOTE — ED Notes (Signed)
Pt has been seen and discharged in another area of the ER; awaiting [facility] transportation.

## 2020-02-19 NOTE — Discharge Summary (Signed)
Physician Discharge Summary  Cynthia Cook GYB:[id number] DOB: [birth year] DOA: 02/05/2020  PCP: Cynthia Blase, MD  Admit date: 02/05/2020 Discharge date: 02/07/2020  Time spent: 35 minutes  Recommendations for Outpatient Follow-up:  1. 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 -Patient had worsening shortness of breath and swelling back in July for which her PCP put her on diuretics with good response -Also not noted to have mild to moderate aortic and mitral stenosis on previous echocardiogram, in March of this year a coronary CTA was being planned to evaluate for CAD however due to multiple issues this never materialized -Week prior to admission patient reports did increase malaise, some dyspnea on exertion, intermittent dizziness followed by an episode of syncope yesterday. -She denied increase edema, no orthopnea -In the ED she was noted to be in atrial fibrillation with heart rate in the 10 5-1 20 range -CT chest showed possible small chronic PE  Hospital Course:  Syncope -Likely related to A. Fib, valvular heart disease, could also have underlying coronary disease -PE on imaging is small and likely chronic -Monitor on telemetry -2D echocardiogram today -no evidence of ACS  Atrial fibrillation with RVR -In the ED she was noted to be in A. fib RVR, started on heparin drip and low-dose Cardizem, subsequently seen by cardiology in consultation,  -2D echo noted preserved EF,  mild to moderate AAS noted -Started on low-dose beta-blocker by Cynthia Cook, TSH was normal -Also transition from heparin to Eliquis -Advised follow-up with Cynthia Cook in 1 week  Small chronic PE -Noted on CTA chest  -started on eliquis this admission  Possible coronary disease -CT noted significant calcification of the coronary arteries, mitral annulus and aortic valve -Follow-up echo -May have underlying coronary disease, may benefit from a Lexiscan Myoview at some point, Cynthia Cook plans to do this as outpatient  AKI on CKD stage IIIa -Creatinine was 1.27 in June/2020, no recent baseline, now 1.64 -Monitor  Hypothyroidism -Continue Synthroid   Discharge Exam: Vitals:   02/07/20 0800 02/07/20 1053  BP: (!) 92/49 112/65  Pulse: 60 79  Resp:    Temp: 98.6 F (37 C)   SpO2: 93%     General: Awake alert oriented x3 Cardiovascular: S1-S2, irregularly irregular Respiratory: Clear  Discharge Instructions   Discharge Instructions    Diet - low sodium heart healthy   Complete by: As directed    Increase activity slowly   Complete by: As directed      Allergies as of 02/07/2020      Reactions   Codeine Nausea Only      Medication List    STOP taking these medications   aspirin EC 325 MG tablet   metoprolol tartrate 50 MG tablet Commonly known as: LOPRESSOR     TAKE these medications   apixaban 5 MG Tabs tablet Commonly known as: ELIQUIS Take 1 tablet (5 mg total) by mouth 2 (two) times daily.   B-complex with vitamin C tablet Take 1 tablet by mouth  daily.   co-enzyme Q-10 30 MG capsule Take 30 mg by mouth daily.   febuxostat 40 MG tablet Commonly known as: ULORIC Take 40 mg by mouth daily.   furosemide 20 MG tablet Commonly known as: LASIX Take 20 mg by mouth as directed. Take 1 tablet 5 Days A Week   levothyroxine 50 MCG tablet Commonly known as: SYNTHROID Take 50-75 mcg by mouth daily. Take 75 mcg (1.5 tablets) on MWF and Take 50 mcg  (1 tablet) all other days   multivitamin with minerals tablet Take 1 tablet by mouth daily.   Selenium 100 MCG Caps Take by mouth.   vitamin C 250 MG tablet Commonly known as: ASCORBIC ACID Take 500 mg by mouth daily.   vitamin E 180 MG (400 UNITS) capsule Take 400 Units by mouth daily.   Zinc 30 MG Tabs Take by mouth.      Allergies  Allergen Reactions  . Codeine Nausea Only    Follow-up Information    Cynthia Dials, MD. Schedule an appointment as soon as possible for a visit in 2 week(s).   Specialty: Cardiology Why: A. fibrillation f/u CAD f/u Contact information: [name] [address] [phone]                The results of significant diagnostics from this hospitalization (including imaging, microbiology, ancillary and laboratory) are listed below for reference.    Significant Diagnostic Studies: DG Sacrum/Coccyx  Result Date: 02/18/2020 CLINICAL DATA:  Fall EXAM: SACRUM AND COCCYX - 2+ VIEW COMPARISON:  None. FINDINGS: Coccyx not visible due to beam attenuation by soft tissue. No sacral fracture. Femoral heads are located. No pelvic fracture. IMPRESSION: Coccyx not visible due to beam attenuation by soft tissue. No pelvic fracture. Electronically Signed   By: Ulyses Jarred M.D.   On: 02/18/2020 23:36   DG Elbow Complete Left  Result Date: 02/18/2020 CLINICAL DATA:  Fall EXAM: LEFT ELBOW - COMPLETE 3+ VIEW COMPARISON:  None. FINDINGS: There is no evidence of fracture, dislocation. A small elbow joint effusion is seen. Posterior olecranon enthesophyte is seen. There is diffuse osteopenia. Soft tissues are unremarkable. IMPRESSION: No definite displaced fracture seen. Small elbow joint effusion. If pain persists, would recommend cross-sectional imaging for further evaluation. Electronically Signed   By: Prudencio Pair M.D.   On: 02/18/2020 21:49   CT HEAD WO CONTRAST  Result Date: 02/05/2020 CLINICAL DATA:  64 year old female with  trauma. EXAM: CT HEAD WITHOUT CONTRAST CT CERVICAL SPINE WITHOUT CONTRAST TECHNIQUE: Multidetector CT imaging of the head and cervical spine was performed following the standard protocol without intravenous contrast. Multiplanar CT image reconstructions of the cervical spine were also generated. COMPARISON:  None. FINDINGS: CT HEAD FINDINGS Brain: Mild age-related atrophy and chronic microvascular ischemic changes. There is no acute intracranial hemorrhage. No mass effect or midline shift. No extra-axial fluid collection. Vascular: No hyperdense vessel or unexpected calcification. Skull: Normal. Negative for fracture or focal lesion. Sinuses/Orbits: No acute finding. Other: None CT CERVICAL SPINE FINDINGS Alignment: No acute subluxation. Skull base and vertebrae: No acute fracture. Soft tissues and spinal canal: No prevertebral fluid or swelling. No visible canal hematoma. Disc levels:  Degenerative changes. Upper chest: There is pulmonary vascular prominence with cephalization, likely vascular congestion. Clinical correlation is recommended. Other: Bilateral carotid bulb calcified plaques. IMPRESSION: 1. No acute intracranial pathology. Mild age-related atrophy and chronic microvascular ischemic changes. 2. No acute/traumatic cervical spine pathology. Electronically Signed   By: Laren Everts.D.  On: 02/05/2020 18:39   CT Angio Chest PE W and/or Wo Contrast  Result Date: 02/05/2020 CLINICAL DATA:  Positive D-dimer, hypoxia, syncope, history of prior PE EXAM: CT ANGIOGRAPHY CHEST WITH CONTRAST TECHNIQUE: Multidetector CT imaging of the chest was performed using the standard protocol during bolus administration of intravenous contrast. Multiplanar CT image reconstructions and MIPs were obtained to evaluate the vascular anatomy. CONTRAST:  79mL OMNIPAQUE IOHEXOL 350 MG/ML SOLN COMPARISON:  Radiograph 02/05/2020 FINDINGS: Cardiovascular: While there is satisfactory central pulmonary arterial opacification.  Contrast opacification of pulmonary arteries is significantly diminished beyond the central and lobar segments. However, there are several areas which are concerning for pulmonary artery emboli. The first being some thin linear almost web-like filling defects present in the right intralobar pulmonary artery which could suggest some chronic embolic disease albeit with more expanded and unopacified segmental branches in the posteromedial basal segments of the right lower lobe as well as several segmental branches in the left lower lobe as well. Central pulmonary arteries are top-normal caliber. RV/LV ratio is maintained. Cardiac size is top normal. Three-vessel coronary artery calcifications are present. Dense calcification is present in the mitral annulus. Additional calcifications are present upon the aortic leaflets. Atherosclerotic plaque within the normal caliber aorta. Suboptimal opacification for luminal evaluation of the aorta. No gross aortic abnormality is seen. Normal 3 vessel branching of the aortic arch. Proximal great vessels are calcified and slightly tortuous but otherwise unremarkable. No major venous abnormalities are seen. Mediastinum/Nodes: No mediastinal fluid or gas. Normal thyroid gland and thoracic inlet. No acute abnormality of the trachea. Small hiatal hernia without other acute esophageal abnormality. No worrisome mediastinal, hilar or axillary adenopathy. Lungs/Pleura: Lung volumes are low with dependent atelectatic change and additional bandlike areas of opacity in both lower lobes and the right middle lobe likely reflecting further subsegmental atelectasis or scarring. No pneumothorax or pleural effusion. Some mild mosaic attenuation may reflect air trapping or atelectatic change accentuated by imaging during exhalation. 7 mm juxta phrenic nodule seen in the right lower lobe (6/84). No other concerning pulmonary nodules or masses. Upper Abdomen: No acute abnormalities present in the  visualized portions of the upper abdomen. Musculoskeletal: Multilevel degenerative changes are present in the imaged portions of the spine. Additional degenerative changes in the shoulders. No worrisome chest wall lesions. Review of the MIP images confirms the above findings. IMPRESSION: 1. Excellent opacification of the central lobar pulmonary arteries however there is mixing artifact within the more distal segmental and subsegmental branches. Web-like filling defects are seen in the right intralobar pulmonary artery which could suggest some chronic pulmonary embolus in this patient history of prior embolic disease. Suspect additional filling defects within segmental and subsegmental branches of both lower lobes though these may be accentuated by mixing artifact. No convincing CT evidence of right heart strain at this time. 2. Cardiomegaly. Extensive calcifications upon the mitral annulus and to a lesser extent the aortic leaflets may warrant further evaluation with echocardiography to assess for underlying valvular dysfunction. Three-vessel coronary artery calcifications. 3. Low lung volumes and basilar atelectatic changes with some more mosaic attenuation likely reflecting further atelectasis or air trapping. 4. 7 mm juxta phrenic nodule in the right lower lobe. Non-contrast chest CT at 6-12 months is recommended. If the nodule is stable at time of repeat CT, then future CT at 18-24 months (from today's scan) is considered optional for low-risk patients, but is recommended for high-risk patients. This recommendation follows the consensus statement: Guidelines for Management of Incidental Pulmonary  Nodules Detected on CT Images: From the Fleischner Society 2017; Radiology 2017; 284:228-243. 5. Aortic Atherosclerosis (ICD10-I70.0). Critical Value/emergent results were called by telephone at the time of interpretation on 02/05/2020 at 9:12 pm to provider [facility] , who verbally acknowledged these results.  Electronically Signed   By: Lovena Le M.D.   On: 02/05/2020 21:13   CT Cervical Spine Wo Contrast  Result Date: 02/05/2020 CLINICAL DATA:  64 year old female with trauma. EXAM: CT HEAD WITHOUT CONTRAST CT CERVICAL SPINE WITHOUT CONTRAST TECHNIQUE: Multidetector CT imaging of the head and cervical spine was performed following the standard protocol without intravenous contrast. Multiplanar CT image reconstructions of the cervical spine were also generated. COMPARISON:  None. FINDINGS: CT HEAD FINDINGS Brain: Mild age-related atrophy and chronic microvascular ischemic changes. There is no acute intracranial hemorrhage. No mass effect or midline shift. No extra-axial fluid collection. Vascular: No hyperdense vessel or unexpected calcification. Skull: Normal. Negative for fracture or focal lesion. Sinuses/Orbits: No acute finding. Other: None CT CERVICAL SPINE FINDINGS Alignment: No acute subluxation. Skull base and vertebrae: No acute fracture. Soft tissues and spinal canal: No prevertebral fluid or swelling. No visible canal hematoma. Disc levels:  Degenerative changes. Upper chest: There is pulmonary vascular prominence with cephalization, likely vascular congestion. Clinical correlation is recommended. Other: Bilateral carotid bulb calcified plaques. IMPRESSION: 1. No acute intracranial pathology. Mild age-related atrophy and chronic microvascular ischemic changes. 2. No acute/traumatic cervical spine pathology. Electronically Signed   By: Anner Crete M.D.   On: 02/05/2020 18:39   DG Chest Portable 1 View  Result Date: 02/05/2020 CLINICAL DATA:  64 year old female with shortness of breath. EXAM: PORTABLE CHEST 1 VIEW COMPARISON:  None. FINDINGS: Minimal bibasilar atelectasis. No focal consolidation, pleural effusion, pneumothorax. The cardiac silhouette is within limits. Atherosclerotic calcification of the aorta. No acute osseous pathology. IMPRESSION: No active disease. Electronically Signed    By: Anner Crete M.D.   On: 02/05/2020 20:21   ECHOCARDIOGRAM COMPLETE  Result Date: 02/06/2020    ECHOCARDIOGRAM REPORT   Patient Name:   KAHLANI GRABER Date of Exam: 02/06/2020 Medical Rec #:  [id number]      Height:       66.0 in Accession #:    [id number]     Weight:       228.0 lb Date of Birth:  07-Jul-1955       BSA:          2.114 m Patient Age:    45 years       BP:           106/70 mmHg Patient Gender: F              HR:           120 bpm. Exam Location:  Inpatient Procedure: 2D Echo, 3D Echo, Cardiac Doppler and Color Doppler Indications:    I26.02 Pulmonary embolus  History:        Patient has prior history of Echocardiogram examinations, most                 recent 05/02/2018. Abnormal ECG, Aortic Valve Disease;                 Arrythmias:Atrial Fibrillation.  Sonographer:    Roseanna Rainbow RDCS Referring Phys: [place]  Sonographer Comments: Technically difficult study due to poor echo windows. Image acquisition challenging due to patient body habitus. IMPRESSIONS  1. Left ventricular ejection fraction, by estimation, is 55 to 60%. The left  ventricle has normal function. The left ventricle has no regional wall motion abnormalities. There is mild concentric left ventricular hypertrophy. Left ventricular diastolic function could not be evaluated.  2. Moderate calcific mitral stenosis is present. MG 5.6 mmHG @ 109 bpm. MVA by VTI 1.86 cm2. Mild to moderate MR is present which is related to restricted movement of the PMVL (IIIB). The mitral valve is degenerative. Mild to moderate mitral valve regurgitation. Moderate mitral stenosis. Severe mitral annular calcification.  3. Mild to moderate AS is present. V max 2.25 m/s, MG 12.3 mmHG, AVA 1.29 cm2, DI 0.34. Gradients lower than expected due to low SVI (SV=56 cc, SVI=26 cc/m2). The aortic valve is tricuspid. There is moderate calcification of the aortic valve. There is moderate thickening of the aortic valve. Aortic valve regurgitation is not  visualized. Mild to moderate aortic valve stenosis. Aortic valve area, by VTI measures 1.29 cm. Aortic valve mean gradient measures 12.3 mmHg. Aortic valve Vmax measures 2.25 m/s.  4. Right ventricular systolic function is normal. The right ventricular size is mildly enlarged. There is mildly elevated pulmonary artery systolic pressure. The estimated right ventricular systolic pressure is 56.8 mmHg.  5. Left atrial size was severely dilated.  6. The inferior vena cava is dilated in size with <50% respiratory variability, suggesting right atrial pressure of 15 mmHg. Comparison(s): Changes from prior study are noted. Afib with RVR is now present. Valvular heart disease is stable. FINDINGS  Left Ventricle: Left ventricular ejection fraction, by estimation, is 55 to 60%. The left ventricle has normal function. The left ventricle has no regional wall motion abnormalities. The left ventricular internal cavity size was normal in size. There is  mild concentric left ventricular hypertrophy. Left ventricular diastolic function could not be evaluated due to atrial fibrillation. Left ventricular diastolic function could not be evaluated. Right Ventricle: The right ventricular size is mildly enlarged. No increase in right ventricular wall thickness. Right ventricular systolic function is normal. There is mildly elevated pulmonary artery systolic pressure. The tricuspid regurgitant velocity is 2.28 m/s, and with an assumed right atrial pressure of 15 mmHg, the estimated right ventricular systolic pressure is 12.7 mmHg. Left Atrium: Left atrial size was severely dilated. Right Atrium: Right atrial size was normal in size. Pericardium: Trivial pericardial effusion is present. Presence of pericardial fat pad. Mitral Valve: Moderate calcific mitral stenosis is present. MG 5.6 mmHG @ 109 bpm. MVA by VTI 1.86 cm2. Mild to moderate MR is present which is related to restricted movement of the PMVL (IIIB). The mitral valve is  degenerative in appearance. Severe mitral annular calcification. Mild to moderate mitral valve regurgitation. Moderate mitral valve stenosis. MV peak gradient, 14.8 mmHg. The mean mitral valve gradient is 5.6 mmHg with average heart rate of 109 bpm. Tricuspid Valve: The tricuspid valve is grossly normal. Tricuspid valve regurgitation is trivial. No evidence of tricuspid stenosis. Aortic Valve: Mild to moderate AS is present. V max 2.25 m/s, MG 12.3 mmHG, AVA 1.29 cm2, DI 0.34. Gradients lower than expected due to low SVI (SV=56 cc, SVI=26 cc/m2). The aortic valve is tricuspid. There is moderate calcification of the aortic valve. There is moderate thickening of the aortic valve. Aortic valve regurgitation is not visualized. Mild to moderate aortic stenosis is present. Aortic valve mean gradient measures 12.3 mmHg. Aortic valve peak gradient measures 20.3 mmHg. Aortic valve area, by VTI measures 1.29 cm. Pulmonic Valve: The pulmonic valve was grossly normal. Pulmonic valve regurgitation is not visualized. No evidence of pulmonic stenosis.  Aorta: The aortic root and ascending aorta are structurally normal, with no evidence of dilitation. Venous: The inferior vena cava is dilated in size with less than 50% respiratory variability, suggesting right atrial pressure of 15 mmHg. IAS/Shunts: The atrial septum is grossly normal.  LEFT VENTRICLE PLAX 2D LVIDd:         4.80 cm LVIDs:         3.90 cm LV PW:         1.70 cm LV IVS:        1.40 cm LVOT diam:     2.20 cm LV SV:         56 LV SV Index:   26 LVOT Area:     3.80 cm  LV Volumes (MOD) LV vol d, MOD A2C: 96.5 ml LV vol d, MOD A4C: 68.9 ml LV vol s, MOD A2C: 58.2 ml LV vol s, MOD A4C: 33.1 ml LV SV MOD A2C:     38.3 ml LV SV MOD A4C:     68.9 ml LV SV MOD BP:      41.1 ml RIGHT VENTRICLE            IVC RV S prime:     6.96 cm/s  IVC diam: 2.50 cm TAPSE (M-mode): 2.0 cm LEFT ATRIUM              Index        RIGHT ATRIUM           Index LA diam:        4.50 cm  2.13 cm/m    RA Area:     19.00 cm LA Vol (A2C):   220.0 ml 104.05 ml/m RA Volume:   61.20 ml  28.95 ml/m LA Vol (A4C):   127.0 ml 60.07 ml/m LA Biplane Vol: 176.0 ml 83.24 ml/m  AORTIC VALVE AV Area (Vmax):    1.58 cm AV Area (Vmean):   1.27 cm AV Area (VTI):     1.29 cm AV Vmax:           225.33 cm/s AV Vmean:          166.667 cm/s AV VTI:            0.435 m AV Peak Grad:      20.3 mmHg AV Mean Grad:      12.3 mmHg LVOT Vmax:         93.80 cm/s LVOT Vmean:        55.900 cm/s LVOT VTI:          0.147 m LVOT/AV VTI ratio: 0.34  AORTA Ao Root diam: 3.50 cm Ao Asc diam:  3.80 cm MITRAL VALVE                TRICUSPID VALVE MV Area (PHT): 3.29 cm     TR Peak grad:   20.8 mmHg MV Area VTI:   1.86 cm     TR Vmax:        228.00 cm/s MV Peak grad:  14.8 mmHg MV Mean grad:  5.6 mmHg     SHUNTS MV Vmax:       1.92 m/s     Systemic VTI:  0.15 m MV Vmean:      98.8 cm/s    Systemic Diam: 2.20 cm MV VTI:        0.30 m MV Decel Time: 231 msec MR Peak grad: 108.2 mmHg MR Mean grad: 71.0 mmHg MR Vmax:  520.00 cm/s MR Vmean:     405.0 cm/s MV E velocity: 165.00 cm/s Eleonore Chiquito MD Electronically signed by Eleonore Chiquito MD Signature Date/Time: 02/06/2020/11:41:22 AM    Final    VAS [place] LOWER EXTREMITY VENOUS (DVT)  Result Date: 02/06/2020  Lower Venous DVTStudy Indications: Pulmonary embolism.  Anticoagulation: Heparin. Limitations: Poor ultrasound/tissue interface. Comparison Study: No prior studies. Performing Technologist: Darlin Coco  Examination Guidelines: A complete evaluation includes B-mode imaging, spectral Doppler, color Doppler, and power Doppler as needed of all accessible portions of each vessel. Bilateral testing is considered an integral part of a complete examination. Limited examinations for reoccurring indications may be performed as noted. The reflux portion of the exam is performed with the patient in reverse Trendelenburg.  +---------+---------------+---------+-----------+----------+--------------+  RIGHT    CompressibilityPhasicitySpontaneityPropertiesThrombus Aging +---------+---------------+---------+-----------+----------+--------------+ CFV      Full           Yes      Yes                                 +---------+---------------+---------+-----------+----------+--------------+ SFJ      Full                                                        +---------+---------------+---------+-----------+----------+--------------+ FV Prox  Full                                                        +---------+---------------+---------+-----------+----------+--------------+ FV Mid   Full                                                        +---------+---------------+---------+-----------+----------+--------------+ FV DistalFull                                                        +---------+---------------+---------+-----------+----------+--------------+ PFV      Full                                                        +---------+---------------+---------+-----------+----------+--------------+ POP      Full           Yes      Yes                                 +---------+---------------+---------+-----------+----------+--------------+ PTV      Full                                                        +---------+---------------+---------+-----------+----------+--------------+  PERO     Full                                                        +---------+---------------+---------+-----------+----------+--------------+   +---------+---------------+---------+-----------+----------+--------------+ LEFT     CompressibilityPhasicitySpontaneityPropertiesThrombus Aging +---------+---------------+---------+-----------+----------+--------------+ CFV      Full           Yes      Yes                                 +---------+---------------+---------+-----------+----------+--------------+ SFJ      Full                                                         +---------+---------------+---------+-----------+----------+--------------+ FV Prox  Full                                                        +---------+---------------+---------+-----------+----------+--------------+ FV Mid   Full                                                        +---------+---------------+---------+-----------+----------+--------------+ FV DistalFull                                                        +---------+---------------+---------+-----------+----------+--------------+ PFV      Full                                                        +---------+---------------+---------+-----------+----------+--------------+ POP      Full           Yes      Yes                                 +---------+---------------+---------+-----------+----------+--------------+ PTV      Full                                                        +---------+---------------+---------+-----------+----------+--------------+ PERO  Not visualized +---------+---------------+---------+-----------+----------+--------------+     Summary: RIGHT: - There is no evidence of deep vein thrombosis in the lower extremity.  - No cystic structure found in the popliteal fossa.  LEFT: - There is no evidence of deep vein thrombosis in the lower extremity. However, portions of this examination were limited- see technologist comments above.  - A cystic structure is found in the popliteal fossa.  *See table(s) above for measurements and observations. Electronically signed by Deitra Mayo MD on 02/06/2020 at 1:03:21 PM.    Final     Microbiology: No results found for this or any previous visit (from the past 240 hour(s)).   Labs: Basic Metabolic Panel: Recent Labs  Lab 02/17/20 1351 02/18/20 1941  NA 142 141  K 4.7 4.1  CL 102 102  CO2 31 27  GLUCOSE 95 175*  BUN 30* 30*  CREATININE  1.52* 1.51*  CALCIUM 9.4 9.0   Liver Function Tests: Recent Labs  Lab 02/18/20 1941  AST 26  ALT 17  ALKPHOS 67  BILITOT 1.1  PROT 6.3*  ALBUMIN 3.4*   No results for input(s): LIPASE, AMYLASE in the last 168 hours. No results for input(s): AMMONIA in the last 168 hours. CBC: Recent Labs  Lab 02/18/20 1941  WBC 7.1  NEUTROABS 5.2  HGB 13.0  HCT 40.4  MCV 90.8  PLT 152   Cardiac Enzymes: No results for input(s): CKTOTAL, CKMB, CKMBINDEX, TROPONINI in the last 168 hours. BNP: BNP (last 3 results) Recent Labs    02/05/20 1811  BNP 341.7*    ProBNP (last 3 results) No results for input(s): PROBNP in the last 8760 hours.  CBG: Recent Labs  Lab 02/18/20 2014  GLUCAP 142*       Signed:  Domenic Polite MD.  [facility] 02/19/2020, 2:34 PM

## 2020-02-19 NOTE — ED Notes (Signed)
Patient verbalizes understanding of discharge instructions. Opportunity for questioning and answers were provided. Pt is now awaiting transport to Home through [facility]

## 2020-02-19 NOTE — ED Notes (Signed)
Pt ambulated well to the bathroom with minimal assistance. Pt used cane per baseline.

## 2020-02-19 NOTE — ED Notes (Signed)
PTAR at Bedside; Report given to same.

## 2020-02-19 NOTE — ED Provider Notes (Signed)
Signed out to me pending ambulation test.  She ambulates to the bathroom. Social work consult for home to assess for PT/OT needs at home.   Montine Circle, PA-C 02/19/20 0030    Isla Pence, MD 02/21/20 [phone]

## 2020-02-20 NOTE — Telephone Encounter (Signed)
Returned call to pt she states that she just "feels awful" and she would like to have cardioversion sooner. Informed pt that Dr Harrell Gave is not doing cardioversion's any sooner. Verbalized understanding. Informed pt that Dr Harrell Gave is not in the office today. Pt states so I'm just stuck with this?  She is asking if there is anything to do about this, change her medication, etc...will forward to Dr Harrell Gave for further direction.

## 2020-02-22 ENCOUNTER — Encounter: Payer: Self-pay | Admitting: Family Medicine

## 2020-02-24 LAB — C-REACTIVE PROTEIN: CRP: 2.6 mg/L (ref <8.0)

## 2020-02-24 LAB — THYROID PEROXIDASE ANTIBODY: Thyroperoxidase Ab SerPl-aCnc: 249 IU/mL — ABNORMAL HIGH (ref <9)

## 2020-02-24 LAB — BASIC METABOLIC PANEL
BUN/Creatinine Ratio: 20 (calc) (ref 6–22)
BUN: 30 mg/dL — ABNORMAL HIGH (ref 7–25)
CO2: 31 mmol/L (ref 20–32)
Calcium: 9.4 mg/dL (ref 8.6–10.4)
Chloride: 102 mmol/L (ref 98–110)
Creat: 1.52 mg/dL — ABNORMAL HIGH (ref 0.50–0.99)
Glucose, Bld: 95 mg/dL (ref 65–99)
Potassium: 4.7 mmol/L (ref 3.5–5.3)
Sodium: 142 mmol/L (ref 135–146)

## 2020-02-24 LAB — T4, FREE: Free T4: 1.3 ng/dL (ref 0.8–1.8)

## 2020-02-24 LAB — T3, FREE: T3, Free: 2.8 pg/mL (ref 2.3–4.2)

## 2020-02-24 LAB — CARDIO IQ® HOMOCYSTEINE: Homocysteine: 20.9 umol/L — ABNORMAL HIGH (ref <10.4)

## 2020-02-24 NOTE — Telephone Encounter (Signed)
I don't know if we can move this up. Could you talk to her? Thanks

## 2020-02-25 ENCOUNTER — Telehealth: Payer: Self-pay | Admitting: Family Medicine

## 2020-02-25 NOTE — Telephone Encounter (Signed)
Homocysteine level is elevated.

## 2020-02-26 ENCOUNTER — Observation Stay (HOSPITAL_COMMUNITY)
Admission: EM | Admit: 2020-02-26 | Discharge: 2020-02-28 | Disposition: A | Discharge status: Home / Self Care | Payer: Medicare Other | Attending: Family Medicine | Admitting: Family Medicine

## 2020-02-26 ENCOUNTER — Encounter (HOSPITAL_COMMUNITY): Payer: Self-pay | Admitting: Emergency Medicine

## 2020-02-26 ENCOUNTER — Other Ambulatory Visit: Payer: Self-pay

## 2020-02-26 DIAGNOSIS — Z79899 Other long term (current) drug therapy: Secondary | ICD-10-CM | POA: Diagnosis not present

## 2020-02-26 DIAGNOSIS — I4891 Unspecified atrial fibrillation: Secondary | ICD-10-CM | POA: Insufficient documentation

## 2020-02-26 DIAGNOSIS — R748 Abnormal levels of other serum enzymes: Secondary | ICD-10-CM | POA: Insufficient documentation

## 2020-02-26 DIAGNOSIS — Z7901 Long term (current) use of anticoagulants: Secondary | ICD-10-CM | POA: Diagnosis not present

## 2020-02-26 DIAGNOSIS — Z20822 Contact with and (suspected) exposure to covid-19: Secondary | ICD-10-CM | POA: Diagnosis not present

## 2020-02-26 DIAGNOSIS — R778 Other specified abnormalities of plasma proteins: Secondary | ICD-10-CM

## 2020-02-26 DIAGNOSIS — E039 Hypothyroidism, unspecified: Secondary | ICD-10-CM | POA: Diagnosis not present

## 2020-02-26 DIAGNOSIS — R55 Syncope and collapse: Secondary | ICD-10-CM | POA: Diagnosis present

## 2020-02-26 HISTORY — DX: Syncope and collapse: R55

## 2020-02-26 HISTORY — DX: Other persistent atrial fibrillation: I48.19

## 2020-02-26 NOTE — ED Triage Notes (Signed)
Patient BIB [facility] for syncopal episode. Patient reports loss of consciousness briefly. Patient stated she is in Afib and is waiting to be cardioverted.

## 2020-02-26 NOTE — ED Triage Notes (Signed)
Patient brought in by [facility] from home. Patient is complaining of syncope and weakness. Patient is having pain on her bottom.

## 2020-02-27 ENCOUNTER — Emergency Department (HOSPITAL_COMMUNITY): Payer: Medicare Other

## 2020-02-27 ENCOUNTER — Encounter (HOSPITAL_COMMUNITY): Payer: Self-pay | Admitting: Emergency Medicine

## 2020-02-27 ENCOUNTER — Other Ambulatory Visit (HOSPITAL_COMMUNITY): Payer: Medicare Other

## 2020-02-27 DIAGNOSIS — R778 Other specified abnormalities of plasma proteins: Secondary | ICD-10-CM | POA: Diagnosis not present

## 2020-02-27 DIAGNOSIS — R55 Syncope and collapse: Secondary | ICD-10-CM | POA: Diagnosis present

## 2020-02-27 DIAGNOSIS — I4891 Unspecified atrial fibrillation: Secondary | ICD-10-CM | POA: Diagnosis not present

## 2020-02-27 DIAGNOSIS — R9431 Abnormal electrocardiogram [ECG] [EKG]: Secondary | ICD-10-CM

## 2020-02-27 LAB — URINALYSIS, ROUTINE W REFLEX MICROSCOPIC
Bacteria, UA: NONE SEEN
Bilirubin Urine: NEGATIVE
Glucose, UA: NEGATIVE mg/dL
Hgb urine dipstick: NEGATIVE
Ketones, ur: NEGATIVE mg/dL
Leukocytes,Ua: NEGATIVE
Nitrite: NEGATIVE
Protein, ur: 30 mg/dL — AB
Specific Gravity, Urine: 1.018 (ref 1.005–1.030)
pH: 5 (ref 5.0–8.0)

## 2020-02-27 LAB — D-DIMER, QUANTITATIVE: D-Dimer, Quant: 0.82 ug/mL-FEU — ABNORMAL HIGH (ref 0.00–0.50)

## 2020-02-27 LAB — CBC
HCT: 39.9 % (ref 36.0–46.0)
Hemoglobin: 13.1 g/dL (ref 12.0–15.0)
MCH: 29.4 pg (ref 26.0–34.0)
MCHC: 32.8 g/dL (ref 30.0–36.0)
MCV: 89.5 fL (ref 80.0–100.0)
Platelets: 159 10*3/uL (ref 150–400)
RBC: 4.46 MIL/uL (ref 3.87–5.11)
RDW: 13.7 % (ref 11.5–15.5)
WBC: 8.6 10*3/uL (ref 4.0–10.5)
nRBC: 0 % (ref 0.0–0.2)

## 2020-02-27 LAB — BASIC METABOLIC PANEL
Anion gap: 10 (ref 5–15)
BUN: 26 mg/dL — ABNORMAL HIGH (ref 8–23)
CO2: 27 mmol/L (ref 22–32)
Calcium: 9.1 mg/dL (ref 8.9–10.3)
Chloride: 99 mmol/L (ref 98–111)
Creatinine, Ser: 1.38 mg/dL — ABNORMAL HIGH (ref 0.44–1.00)
GFR, Estimated: 42 mL/min — ABNORMAL LOW (ref >60)
Glucose, Bld: 144 mg/dL — ABNORMAL HIGH (ref 70–99)
Potassium: 4.3 mmol/L (ref 3.5–5.1)
Sodium: 136 mmol/L (ref 135–145)

## 2020-02-27 LAB — RESP PANEL BY RT-PCR (FLU A&B, COVID) ARPGX2
Influenza A by PCR: NEGATIVE
Influenza B by PCR: NEGATIVE
SARS Coronavirus 2 by RT PCR: NEGATIVE

## 2020-02-27 LAB — CBG MONITORING, ED: Glucose-Capillary: 154 mg/dL — ABNORMAL HIGH (ref 70–99)

## 2020-02-27 LAB — TROPONIN I (HIGH SENSITIVITY)
Troponin I (High Sensitivity): 21 ng/L — ABNORMAL HIGH (ref <18)
Troponin I (High Sensitivity): 23 ng/L — ABNORMAL HIGH (ref <18)

## 2020-02-27 LAB — MAGNESIUM: Magnesium: 2.3 mg/dL (ref 1.7–2.4)

## 2020-02-27 MED ORDER — ASCORBIC ACID 500 MG PO TABS
500.0000 mg | ORAL_TABLET | Freq: Every day | ORAL | Status: DC
  Administered 2020-02-28: 500 mg via ORAL
  Filled 2020-02-27: qty 1

## 2020-02-27 MED ORDER — APIXABAN 5 MG PO TABS
5.0000 mg | ORAL_TABLET | Freq: Two times a day (BID) | ORAL | Status: DC
  Administered 2020-02-27 – 2020-02-28 (×2): 5 mg via ORAL
  Filled 2020-02-27 (×2): qty 1

## 2020-02-27 MED ORDER — SELENIUM 100 MCG PO CAPS: 100.0000 ug | ORAL_CAPSULE | Freq: Every day | ORAL | Status: DC

## 2020-02-27 MED ORDER — VITAMIN D 25 MCG (1000 UNIT) PO TABS
2000.0000 [IU] | ORAL_TABLET | Freq: Every day | ORAL | Status: DC
  Administered 2020-02-28: 2000 [IU] via ORAL
  Filled 2020-02-27: qty 2

## 2020-02-27 MED ORDER — LORAZEPAM 2 MG/ML IJ SOLN: 0.5000 mg | Freq: Once | INTRAMUSCULAR | Status: DC

## 2020-02-27 MED ORDER — SODIUM CHLORIDE 0.9% FLUSH
3.0000 mL | Freq: Two times a day (BID) | INTRAVENOUS | Status: DC
  Administered 2020-02-27 (×2): 3 mL via INTRAVENOUS

## 2020-02-27 MED ORDER — ADULT MULTIVITAMIN W/MINERALS CH
1.0000 | ORAL_TABLET | Freq: Every day | ORAL | Status: DC
  Administered 2020-02-28: 1 via ORAL
  Filled 2020-02-27: qty 1

## 2020-02-27 MED ORDER — IOHEXOL 350 MG/ML SOLN
100.0000 mL | Freq: Once | INTRAVENOUS | Status: AC | PRN
  Administered 2020-02-27: 80 mL via INTRAVENOUS

## 2020-02-27 MED ORDER — ZINC SULFATE 220 (50 ZN) MG PO CAPS
220.0000 mg | ORAL_CAPSULE | Freq: Every day | ORAL | Status: DC
  Administered 2020-02-28: 220 mg via ORAL
  Filled 2020-02-27: qty 1

## 2020-02-27 MED ORDER — APIXABAN 5 MG PO TABS: 5.0000 mg | ORAL_TABLET | Freq: Two times a day (BID) | ORAL | Status: DC

## 2020-02-27 MED ORDER — ACETAMINOPHEN 500 MG PO TABS
1000.0000 mg | ORAL_TABLET | Freq: Once | ORAL | Status: AC
  Administered 2020-02-27: 1000 mg via ORAL
  Filled 2020-02-27: qty 2

## 2020-02-27 MED ORDER — ONDANSETRON HCL 4 MG/2ML IJ SOLN: 4.0000 mg | Freq: Four times a day (QID) | INTRAMUSCULAR | Status: DC | PRN

## 2020-02-27 MED ORDER — PROPOFOL 10 MG/ML IV BOLUS
0.5000 mg/kg | Freq: Once | INTRAVENOUS | Status: AC
  Administered 2020-02-27: 52.2 mg via INTRAVENOUS
  Filled 2020-02-27: qty 20

## 2020-02-27 MED ORDER — KETOROLAC TROMETHAMINE 30 MG/ML IJ SOLN
15.0000 mg | Freq: Once | INTRAMUSCULAR | Status: AC
  Administered 2020-02-27: 15 mg via INTRAVENOUS
  Filled 2020-02-27: qty 1

## 2020-02-27 MED ORDER — VITAMIN K2-VITAMIN D3 45-2000 MCG-UNIT PO CAPS: ORAL_CAPSULE | Freq: Every day | ORAL | Status: DC

## 2020-02-27 MED ORDER — ONDANSETRON HCL 4 MG PO TABS: 4.0000 mg | ORAL_TABLET | Freq: Four times a day (QID) | ORAL | Status: DC | PRN

## 2020-02-27 MED ORDER — ACETAMINOPHEN 650 MG RE SUPP: 650.0000 mg | Freq: Four times a day (QID) | RECTAL | Status: DC | PRN

## 2020-02-27 MED ORDER — SODIUM CHLORIDE (PF) 0.9 % IJ SOLN
INTRAMUSCULAR | Status: AC
  Administered 2020-02-27: 10 mL
  Filled 2020-02-27: qty 50

## 2020-02-27 MED ORDER — ACETAMINOPHEN 325 MG PO TABS
650.0000 mg | ORAL_TABLET | Freq: Four times a day (QID) | ORAL | Status: DC | PRN
  Administered 2020-02-28: 650 mg via ORAL
  Filled 2020-02-27: qty 2

## 2020-02-27 MED ORDER — LEVOTHYROXINE SODIUM 50 MCG PO TABS
50.0000 ug | ORAL_TABLET | Freq: Every day | ORAL | Status: DC
  Administered 2020-02-28: 50 ug via ORAL
  Filled 2020-02-27: qty 1

## 2020-02-27 MED ORDER — TRAMADOL HCL 50 MG PO TABS
50.0000 mg | ORAL_TABLET | Freq: Four times a day (QID) | ORAL | Status: DC | PRN
  Administered 2020-02-27: 50 mg via ORAL
  Filled 2020-02-27: qty 1

## 2020-02-27 MED ORDER — VITAMIN A 3 MG (10000 UNIT) PO CAPS
10000.0000 [IU] | ORAL_CAPSULE | Freq: Every day | ORAL | Status: DC
  Administered 2020-02-28: 10000 [IU] via ORAL
  Filled 2020-02-27: qty 1

## 2020-02-27 NOTE — ED Provider Notes (Signed)
.  Sedation  Date/Time: 02/27/2020 11:36 AM Performed by: Charlesetta Shanks, MD Authorized by: Charlesetta Shanks, MD   Consent:    Consent obtained:  Verbal   Consent given by:  Patient   Risks discussed:  Allergic reaction, dysrhythmia, inadequate sedation, nausea, prolonged hypoxia resulting in organ damage, prolonged sedation necessitating reversal, respiratory compromise necessitating ventilatory assistance and intubation and vomiting Universal protocol:    Procedure explained and questions answered to patient or proxy's satisfaction: yes     Relevant documents present and verified: yes     Test results available and properly labeled: yes     Imaging studies available: yes     Required blood products, implants, devices, and special equipment available: yes     Site/side marked: yes     Immediately prior to procedure a time out was called: yes     Patient identity confirmation method:  Verbally with patient Indications:    Procedure performed:  Cardioversion   Procedure necessitating sedation performed by:  Different physician Pre-sedation assessment:    Time since last food or drink:  9pm   ASA classification: class 3 - patient with severe systemic disease     Neck mobility: normal     Mouth opening:  3 or more finger widths   Thyromental distance:  4 finger widths   Mallampati score:  I - soft palate, uvula, fauces, pillars visible   Pre-sedation assessments completed and reviewed: airway patency, cardiovascular function, hydration status, mental status, nausea/vomiting, pain level, respiratory function and temperature     Pre-sedation assessment completed:  02/27/2020 11:38 AM Immediate pre-procedure details:    Reassessment: Patient reassessed immediately prior to procedure     Reviewed: vital signs, relevant labs/tests and NPO status     Verified: bag valve mask available, emergency equipment available, intubation equipment available, IV patency confirmed, oxygen available and  suction available   Procedure details (see MAR for exact dosages):    Preoxygenation:  Nasal cannula   Sedation:  Propofol   Intended level of sedation: deep   Intra-procedure monitoring:  Blood pressure monitoring, cardiac monitor, continuous pulse oximetry, frequent LOC assessments, frequent vital sign checks and continuous capnometry   Intra-procedure events: none     Total Provider sedation time (minutes):  20 Post-procedure details:    Attendance: Constant attendance by certified staff until patient recovered     Recovery: Patient returned to pre-procedure baseline     Post-sedation assessments completed and reviewed: airway patency, cardiovascular function, hydration status, mental status, nausea/vomiting, pain level, respiratory function and temperature     Patient is stable for discharge or admission: yes     Patient tolerance:  Tolerated well, no immediate complications Comments:     Procedural sedation provided by request of Dr. Skeet Latch for cardioversion.  Patient reports she has previously had propofol for dental procedure without complication.  No history of allergic reaction or adverse anesthesia reaction.  She reports she did have severe nausea and vomiting with fentanyl at 1 point time.  Patient tolerated procedure well.  She is back to normal baseline post sedation.  Feeling well without complaints.     Charlesetta Shanks, MD 02/29/20 [phone]

## 2020-02-27 NOTE — ED Notes (Signed)
Pt refused ativan stating she don't need it. Patient is anxious about cardioversion

## 2020-02-27 NOTE — CV Procedure (Signed)
Electrical Cardioversion Procedure Note Cynthia Cook [id number] December 09, 1955  Procedure: Electrical Cardioversion Indications:  Atrial Fibrillation  Procedure Details Consent: Risks of procedure as well as the alternatives and risks of each were explained to the (patient/caregiver).  Consent for procedure obtained. Time Out: Verified patient identification, verified procedure, site/side was marked, verified correct patient position, special equipment/implants available, medications/allergies/relevent history reviewed, required imaging and test results available.  Performed  Patient placed on cardiac monitor, pulse oximetry, supplemental oxygen as necessary.  Sedation given: propofol 50mg  Pacer pads placed anterior and posterior chest.  Cardioverted 1 time(s).  Cardioverted at 150J.  Evaluation Findings: Post procedure EKG shows: NSR Complications: None Patient did tolerate procedure well.   Skeet Latch, MD 02/27/2020, 1:26 PM

## 2020-02-27 NOTE — ED Notes (Signed)
NP Mickel Baas asked for MD name today due to wanting to discuss with them about cardioverting in the ER.

## 2020-02-27 NOTE — ED Notes (Signed)
Dr.Palumbo requested RN to clean patient and place a Purewick in place for urine specimen.  Patient then requested a I&O cath instead because it would take to long to obtain specimen. Dr.Palumbo informed and is fine with I&O cath.

## 2020-02-27 NOTE — Consult Note (Signed)
Cardiology Consultation:   Patient ID: Cynthia Cook MRN: [id number]; DOB: [birth year]  Admit date: 02/26/2020 Date of Consult: 02/27/2020  Primary Care Provider: Eunice Blase, MD [place] Cardiologist: Buford Dresser, MD  [facility] Electrophysiologist:  None    Patient Profile:   Cynthia Cook is a 64 y.o. female with a hx of syncope and atrial fib, CT with small chronic PE, gout, hypothyroidism, mod. MS, mild to mod MR, mild to mod AS.  who is being seen today for the evaluation of syncope and atrial fib.at the request of Dr. Marylyn Ishihara.  History of Present Illness:   Cynthia Cook hospitlaized in Oct 2021 with syncope and afib was seen by Dr. Lossie Faes on that admit.  With the syncope only mild burning in her upper chest and neck. Was walking out the door to go to a doctor's appt when she lost consciousness. Had about 10 seconds of presyncope, felt like "lights were going out," tried to steady herself but grabbed an unstable chair. Sat down, but hit her head. Thinks she was out about 1-2 minutes. No confusion, knew exactly what had happened.   Placed on Eliquis, and BB. EF preserved. +MR, +MS, +AS chronic PE and CT of chest with significant calcification of the coronary arteries.  TSH was normal.    Saw Dr. Harrell Gave in the office. 02/12/20  She was dizzy with the dilt and metoprolol. Taking lasix 3 times per week.  She has planned DCCV for 03/02/20   She was seen in ER 02/18/20 with syncope and feeling sick in a fib with HR 104.   Today presents by EMS with syncope and weakness.  Was standing if front of recliner felt "lights dimmng: and fell into her chair.  Out for a few min.  No chest pain called EMS.  She has been sensitive to metoprolol , has been holding if HR <100 or SBP < 90  She did take 1 hour prior to syncopal event.    She was on BB with second episode of syncope but on first episode with new atrial fib she was not.   She had been scheduled for cardiac CTA but  never had.  Refuses statins. No chest pain  Has not missed any eliquis.    EKG:  The EKG was personally reviewed and demonstrates:  Atrial fib RVR 121 no acute ST changes  Telemetry:  Telemetry was personally reviewed and demonstrates:  Atrial fib.    Past Medical History:  Diagnosis Date  . Back pain   . Murmur   . PE (pulmonary embolism)   . Persistent atrial fibrillation ([place])   . Syncope   . Thyroid disease     History reviewed. No pertinent surgical history.   Home Medications:  Prior to Admission medications   Medication Sig Start Date End Date Taking? Authorizing Provider  allopurinol (ZYLOPRIM) 100 MG tablet Take 100 mg by mouth daily as needed (gout flare).   Yes [provider]  apixaban (ELIQUIS) 5 MG TABS tablet Take 1 tablet (5 mg total) by mouth 2 (two) times daily. 02/07/20  Yes Domenic Polite, MD  ascorbic acid (VITAMIN C) 500 MG tablet Take 500 mg by mouth daily.   Yes [provider]  ASTRAGALUS PO Take 500 mg by mouth daily.   Yes [provider]  b complex vitamins capsule Take 1 capsule by mouth daily.   Yes [provider]  Coenzyme Q10 (COQ-10) 400 MG CAPS Take 400 mg by mouth daily.  Yes [provider]  furosemide (LASIX) 20 MG tablet Take 20 mg by mouth daily as needed for edema (shortness of breath).  11/07/19  Yes [provider]  Homeopathic Products (ARNICA EX) Apply 1 application topically daily as needed (knee pain).   Yes [provider]  levothyroxine (SYNTHROID) 50 MCG tablet Take 50 mcg by mouth daily.  09/11/18  Yes [provider]  MAGNESIUM GLYCINATE PO Take 400 mg by mouth daily.   Yes [provider]  metoprolol succinate (TOPROL-XL) 25 MG 24 hr tablet Take 0.5 tablets (12.5 mg total) by mouth daily. Patient taking differently: Take 12.5 mg by mouth daily. Will skip dose if feeling lightheaded / dizzy 02/18/20 03/19/20 Yes Couture, Cortni S, PA-C  Multiple  Vitamins-Minerals (MULTIVITAMIN WITH MINERALS) tablet Take 1 tablet by mouth daily.   Yes [provider]  Potassium 99 MG TABS Take 297 mg by mouth daily.   Yes [provider]  Selenium 100 MCG CAPS Take 100 mcg by mouth daily.    Yes [provider]  triamcinolone cream (KENALOG) 0.1 % Apply 1 application topically daily as needed (rash).  02/09/20  Yes [provider]  Turmeric Curcumin 500 MG CAPS Take 1,000 mg by mouth daily as needed (pain).   Yes [provider]  vitamin A 3 MG (10000 UNITS) capsule Take 10,000 Units by mouth daily.   Yes [provider]  Vitamin D-Vitamin K (VITAMIN K2-VITAMIN D3 PO) Take 1 tablet by mouth daily.    Yes [provider]  Zinc 50 MG CAPS Take 50 mg by mouth daily.   Yes [provider]    Inpatient Medications: Scheduled Meds:  Continuous Infusions:  PRN Meds:   Allergies:    Allergies  Allergen Reactions  . Codeine Nausea Only  . Fentanyl Nausea And Vomiting    Dizziness, lightheaded     Social History:   Social History   Socioeconomic History  . Marital status: Divorced    Spouse name: Not on file  . Number of children: Not on file  . Years of education: Not on file  . Highest education level: Not on file  Occupational History  . Not on file  Tobacco Use  . Smoking status: Never Smoker  . Smokeless tobacco: Never Used  Vaping Use  . Vaping Use: Never used  Substance and Sexual Activity  . Alcohol use: No  . Drug use: Not on file  . Sexual activity: Not on file  Other Topics Concern  . Not on file  Social History Narrative  . Not on file   Social Determinants of Health   Financial Resource Strain:   . Difficulty of Paying Living Expenses: Not on file  Food Insecurity:   . Worried About Charity fundraiser in the Last Year: Not on file  . Ran Out of Food in the Last Year: Not on file  Transportation Needs:   . Lack of Transportation (Medical): Not  on file  . Lack of Transportation (Non-Medical): Not on file  Physical Activity:   . Days of Exercise per Week: Not on file  . Minutes of Exercise per Session: Not on file  Stress:   . Feeling of Stress : Not on file  Social Connections:   . Frequency of Communication with Friends and Family: Not on file  . Frequency of Social Gatherings with Friends and Family: Not on file  . Attends Religious Services: Not on file  . Active Member  of Clubs or Organizations: Not on file  . Attends Archivist Meetings: Not on file  . Marital Status: Not on file  Intimate Partner Violence:   . Fear of Current or Ex-Partner: Not on file  . Emotionally Abused: Not on file  . Physically Abused: Not on file  . Sexually Abused: Not on file    Family History:    Family History  Family history unknown: Yes     ROS:  Please see the history of present illness.  General:no colds or fevers, no weight changes Skin:no rashes or ulcers HEENT:no blurred vision, no congestion CV:see HPI PUL:see HPI GI:no diarrhea constipation or melena, no indigestion GU:no hematuria, no dysuria MS:no joint pain, no claudication Neuro:no syncope, no lightheadedness Endo:no diabetes, no thyroid disease  All other ROS reviewed and negative.     Physical Exam/Data:   Vitals:   02/27/20 0645 02/27/20 0758 02/27/20 0800 02/27/20 0815  BP: (!) 125/95 129/80 115/88 (!) 125/98  Pulse: (!) 108 94 87 94  Resp: (!) 27 20 16  (!) 26  Temp:  98.6 F (37 C)    TempSrc:  Oral    SpO2: 99% 100% 100% 97%  Weight:      Height:       No intake or output data in the 24 hours ending 02/27/20 1023 Last 3 Weights 02/26/2020 02/18/2020 02/17/2020  Weight (lbs) 230 lb 227 lb 15.3 oz 228 lb  Weight (kg) 104.327 kg 103.4 kg 103.42 kg     Body mass index is 37.12 kg/m.  General:  Well nourished, well developed, in no acute distress HEENT: normal Lymph: no adenopathy Neck: no JVD Endocrine:  No thryomegaly Vascular: No  carotid bruits; pedal pulses 1+ bilaterally  Cardiac:  normal S1, S2; RRR; 2-3/6 murmur no gallup or rub Lungs:  few rales in bases to auscultation bilaterally, no wheezing, rhonchi Abd: soft, nontender, no hepatomegaly  Ext: no edema Musculoskeletal:  No deformities, BUE and BLE strength normal and equal Skin: warm and dry  Neuro:  Alert and oriented X 3 MAE follows commands, no focal abnormalities noted Psych:  Normal affect    Relevant CV Studies: Echo 02/06/20 IMPRESSIONS    1. Left ventricular ejection fraction, by estimation, is 55 to 60%. The  left ventricle has normal function. The left ventricle has no regional  wall motion abnormalities. There is mild concentric left ventricular  hypertrophy. Left ventricular diastolic  function could not be evaluated.  2. Moderate calcific mitral stenosis is present. MG 5.6 mmHG @ 109 bpm.  MVA by VTI 1.86 cm2. Mild to moderate MR is present which is related to  restricted movement of the PMVL (IIIB). The mitral valve is degenerative.  Mild to moderate mitral valve  regurgitation. Moderate mitral stenosis. Severe mitral annular  calcification.  3. Mild to moderate AS is present. V max 2.25 m/s, MG 12.3 mmHG, AVA 1.29  cm2, DI 0.34. Gradients lower than expected due to low SVI (SV=56 cc,  SVI=26 cc/m2). The aortic valve is tricuspid. There is moderate  calcification of the aortic valve. There is  moderate thickening of the aortic valve. Aortic valve regurgitation is not  visualized. Mild to moderate aortic valve stenosis. Aortic valve area, by  VTI measures 1.29 cm. Aortic valve mean gradient measures 12.3 mmHg.  Aortic valve Vmax measures 2.25  m/s.  4. Right ventricular systolic function is normal. The right ventricular  size is mildly enlarged. There is mildly elevated pulmonary artery  systolic pressure.  The estimated right ventricular systolic pressure is  76.2 mmHg.  5. Left atrial size was severely dilated.  6. The  inferior vena cava is dilated in size with <50% respiratory  variability, suggesting right atrial pressure of 15 mmHg.   Comparison(s): Changes from prior study are noted. Afib with RVR is now  present. Valvular heart disease is stable.   FINDINGS  Left Ventricle: Left ventricular ejection fraction, by estimation, is 55  to 60%. The left ventricle has normal function. The left ventricle has no  regional wall motion abnormalities. The left ventricular internal cavity  size was normal in size. There is  mild concentric left ventricular hypertrophy. Left ventricular diastolic  function could not be evaluated due to atrial fibrillation. Left  ventricular diastolic function could not be evaluated.   Right Ventricle: The right ventricular size is mildly enlarged. No  increase in right ventricular wall thickness. Right ventricular systolic  function is normal. There is mildly elevated pulmonary artery systolic  pressure. The tricuspid regurgitant  velocity is 2.28 m/s, and with an assumed right atrial pressure of 15  mmHg, the estimated right ventricular systolic pressure is 83.1 mmHg.   Left Atrium: Left atrial size was severely dilated.   Right Atrium: Right atrial size was normal in size.   Pericardium: Trivial pericardial effusion is present. Presence of  pericardial fat pad.   Mitral Valve: Moderate calcific mitral stenosis is present. MG 5.6 mmHG @  109 bpm. MVA by VTI 1.86 cm2. Mild to moderate MR is present which is  related to restricted movement of the PMVL (IIIB). The mitral valve is  degenerative in appearance. Severe  mitral annular calcification. Mild to moderate mitral valve regurgitation.  Moderate mitral valve stenosis. MV peak gradient, 14.8 mmHg. The mean  mitral valve gradient is 5.6 mmHg with average heart rate of 109 bpm.   Tricuspid Valve: The tricuspid valve is grossly normal. Tricuspid valve  regurgitation is trivial. No evidence of tricuspid stenosis.    Aortic Valve: Mild to moderate AS is present. V max 2.25 m/s, MG 12.3  mmHG, AVA 1.29 cm2, DI 0.34. Gradients lower than expected due to low SVI  (SV=56 cc, SVI=26 cc/m2). The aortic valve is tricuspid. There is moderate  calcification of the aortic valve.  There is moderate thickening of the aortic valve. Aortic valve  regurgitation is not visualized. Mild to moderate aortic stenosis is  present. Aortic valve mean gradient measures 12.3 mmHg. Aortic valve peak  gradient measures 20.3 mmHg. Aortic valve area,  by VTI measures 1.29 cm.   Pulmonic Valve: The pulmonic valve was grossly normal. Pulmonic valve  regurgitation is not visualized. No evidence of pulmonic stenosis.   Aorta: The aortic root and ascending aorta are structurally normal, with  no evidence of dilitation.   Venous: The inferior vena cava is dilated in size with less than 50%  respiratory variability, suggesting right atrial pressure of 15 mmHg.   IAS/Shunts: The atrial septum is grossly normal.   Laboratory Data:  High Sensitivity Troponin:   Recent Labs  Lab 02/05/20 2006 02/18/20 1941 02/18/20 2124 02/27/20 0108 02/27/20 0220  TROPONINIHS 24* 14 19* 21* 23*     Chemistry Recent Labs  Lab 02/27/20 0108  NA 136  K 4.3  CL 99  CO2 27  GLUCOSE 144*  BUN 26*  CREATININE 1.38*  CALCIUM 9.1  GFRNONAA 42*  ANIONGAP 10    No results for input(s): PROT, ALBUMIN, AST, ALT, ALKPHOS, BILITOT in the last  168 hours. Hematology Recent Labs  Lab 02/27/20 0108  WBC 8.6  RBC 4.46  HGB 13.1  HCT 39.9  MCV 89.5  MCH 29.4  MCHC 32.8  RDW 13.7  PLT 159   BNPNo results for input(s): BNP, PROBNP in the last 168 hours.  DDimer  Recent Labs  Lab 02/27/20 0142  DDIMER 0.82*     Radiology/Studies:  CT Angio Chest PE W and/or Wo Contrast  Result Date: 02/27/2020 CLINICAL DATA:  Syncope EXAM: CT ANGIOGRAPHY CHEST WITH CONTRAST TECHNIQUE: Multidetector CT imaging of the chest was performed using  the standard protocol during bolus administration of intravenous contrast. Multiplanar CT image reconstructions and MIPs were obtained to evaluate the vascular anatomy. CONTRAST:  110mL OMNIPAQUE IOHEXOL 350 MG/ML SOLN COMPARISON:  None. FINDINGS: Cardiovascular: There is adequate opacification of the pulmonary arterial tree. There is no intraluminal filling defect identified to suggest acute pulmonary embolism. The central pulmonary arteries are enlarged in keeping with pulmonary arterial hypertension. There is extensive coronary artery calcification. There is extensive calcification of the mitral valve annulus. Mild calcification of the aortic valve leaflets. There is mild global cardiomegaly. No pericardial effusion. Atherosclerotic calcification is seen within the thoracic aorta. No aortic aneurysm. Mediastinum/Nodes: No pathologic thoracic adenopathy. Esophagus is unremarkable. Lungs/Pleura: Trace right and small left pleural effusions are present. Mild bibasilar atelectasis. No superimposed confluent pulmonary infiltrate. No pneumothorax. Central airways are widely patent. Upper Abdomen: Unremarkable Musculoskeletal: No acute bone abnormality. Review of the MIP images confirms the above findings. IMPRESSION: No pulmonary embolism. Morphologic changes of pulmonary arterial hypertension. Mild global cardiomegaly.  Extensive coronary artery calcification. Extensive calcification of the mitral valve annulus noted. Echocardiography may be helpful to assess the degree of valvular dysfunction. Trace right and small left pleural effusions, slightly increased since prior examination. Aortic Atherosclerosis (ICD10-I70.0). Electronically Signed   By: Fidela Salisbury MD   On: 02/27/2020 06:07   DG Chest Portable 1 View  Result Date: 02/27/2020 CLINICAL DATA:  Syncope EXAM: PORTABLE CHEST 1 VIEW COMPARISON:  02/05/2020 FINDINGS: Lung volumes are small. Since the prior examination, a tiny left pleural effusion has  developed. There is bibasilar atelectasis. No pneumothorax. Cardiac size within normal limits. Pulmonary vascularity is normal. No acute bone abnormality. IMPRESSION: Interval development of bibasilar atelectasis and tiny left pleural effusion. Electronically Signed   By: Fidela Salisbury MD   On: 02/27/2020 00:48     Assessment and Plan:   1. Syncope has had at least 3 times all in a fib.  Twice she was on BB.  May be related to hypotension- with ER visit 02/18/20 + orthostatic hypotension.  Has not taken any lasix.  She eats and drinks ok but does not prepare a lot of meals. 2. Atrial fib now RVR has trouble taking her BB becomes lightheaded afterward and did take today 12.5 toprol XL so not consistent leading to elevated HR. She took maybe 5 times this week.  No lasix this week. 3. CAD by CT of chest , has been ordered for cardiac CTA by has not yet done.  Refuses statins. 4. Trace rt and small Lt pl effusions slightly increased since 02/05/20 5. Remote PE  Neg PE this admit 6. anticoagulation on eliquis. No bleeding 7. QTc prolonged in some leads due to a fib.             CHA2DS2-VASc Score = 4  This indicates a 4.8% annual risk of stroke. The patient's score is based upon: CHF History: 1 HTN  History: 1 Diabetes History: 0 Stroke History: 0 Vascular Disease History: 0 Age Score: 1 Gender Score: 1         For questions or updates, please contact [place] Please consult [email or web address] for contact info under    Signed, Cecilie Kicks, NP  02/27/2020 10:23 AM

## 2020-02-27 NOTE — Sedation Documentation (Signed)
Cardioversion completed, one shock delivered at 120 J. Patient noted to convert to NSR on monitor.

## 2020-02-27 NOTE — Progress Notes (Signed)
PHARMACIST - PHYSICIAN ORDER COMMUNICATION  CONCERNING: P&T Medication Policy on Herbal Medications  DESCRIPTION:  This patient's order for:  Selenium  has been noted.  This product(s) is classified as an "herbal" or natural product. Due to a lack of definitive safety studies or FDA approval, nonstandard manufacturing practices, plus the potential risk of unknown drug-drug interactions while on inpatient medications, the Pharmacy and Therapeutics Committee does not permit the use of "herbal" or natural products of this type within [facility].   ACTION TAKEN: The pharmacy department is unable to verify this order at this time and your patient has been informed of this safety policy. Please reevaluate patient's clinical condition at discharge and address if the herbal or natural product(s) should be resumed at that time.  Peggyann Juba, PharmD, BCPS Pharmacy: [phone] 02/27/2020 2:23 PM

## 2020-02-27 NOTE — H&P (Signed)
History and Physical    Cynthia Cook LTJ:[id number] DOB: 1956/02/05 DOA: 02/26/2020  PCP: Eunice Blase, MD  Patient coming from: Home  Chief Complaint: fainting  HPI: Cynthia Cook is a 64 y.o. female with medical history significant of a fib. Presenting after syncopal episode. Multiple syncopal episodes over the last month. Her workup over that time has only showed a fib. She was started on metoprolol and eliquis. She was scheduled for cardioversion on 11/23. Last night she had another syncopal episode. She was standing in front of her recliner when she felt "the lights dimming" and fell into her chair. She was out for a few minutes. No CP, palpitations, or head injury. She called for assistance after waking up and was brought to the hospital.   Of note, she has had difficulty with her metoprolol. She was sensitive to the initial dosing. It was then cut in half. She has been holding that medication if her HR is less than 100 or her SBP less than 90. She reports that she did take it 1 hour prior to her syncopal episode. She notes that her second syncopal episode in the past month was blamed on her metoprolol being too strong for her.   ED Course: CTA PE was negative. She was found to have a mildly elevated trp. TRH was called for admission.   Review of Systems:  Denies CP, palpitations, visual/auditory changes, dyspnea, bowel/bladder incontinence. Review of systems is otherwise negative for all not mentioned in HPI.   PMHx Past Medical History:  Diagnosis Date  . Back pain   . Murmur   . PE (pulmonary embolism)   . Thyroid disease     PSHx History reviewed. No pertinent surgical history.  SocHx  reports that she has never smoked. She has never used smokeless tobacco. She reports that she does not drink alcohol. No history on file for drug use.  Allergies  Allergen Reactions  . Codeine Nausea Only  . Fentanyl Nausea And Vomiting    Dizziness, lightheaded     FamHx Family  History  Family history unknown: Yes    Prior to Admission medications   Medication Sig Start Date End Date Taking? Authorizing Provider  allopurinol (ZYLOPRIM) 100 MG tablet Take 100 mg by mouth daily as needed (gout flare).   Yes [provider]  apixaban (ELIQUIS) 5 MG TABS tablet Take 1 tablet (5 mg total) by mouth 2 (two) times daily. 02/07/20  Yes Domenic Polite, MD  ascorbic acid (VITAMIN C) 500 MG tablet Take 500 mg by mouth daily.   Yes [provider]  ASTRAGALUS PO Take 500 mg by mouth daily.   Yes [provider]  b complex vitamins capsule Take 1 capsule by mouth daily.   Yes [provider]  Coenzyme Q10 (COQ-10) 400 MG CAPS Take 400 mg by mouth daily.   Yes [provider]  furosemide (LASIX) 20 MG tablet Take 20 mg by mouth daily as needed for edema (shortness of breath).  11/07/19  Yes [provider]  Homeopathic Products (ARNICA EX) Apply 1 application topically daily as needed (knee pain).   Yes [provider]  levothyroxine (SYNTHROID) 50 MCG tablet Take 50 mcg by mouth daily.  09/11/18  Yes [provider]  MAGNESIUM GLYCINATE PO Take 400 mg by mouth daily.   Yes [provider]  metoprolol succinate (TOPROL-XL) 25 MG 24 hr tablet Take 0.5 tablets (12.5 mg total) by mouth daily. Patient taking differently: Take 12.5 mg  by mouth daily. Will skip dose if feeling lightheaded / dizzy 02/18/20 03/19/20 Yes Couture, Cortni S, PA-C  Multiple Vitamins-Minerals (MULTIVITAMIN WITH MINERALS) tablet Take 1 tablet by mouth daily.   Yes [provider]  Potassium 99 MG TABS Take 297 mg by mouth daily.   Yes [provider]  Selenium 100 MCG CAPS Take 100 mcg by mouth daily.    Yes [provider]  triamcinolone cream (KENALOG) 0.1 % Apply 1 application topically daily as needed (rash).  02/09/20  Yes [provider]  Turmeric Curcumin 500 MG CAPS Take 1,000 mg by mouth  daily as needed (pain).   Yes [provider]  vitamin A 3 MG (10000 UNITS) capsule Take 10,000 Units by mouth daily.   Yes [provider]  Vitamin D-Vitamin K (VITAMIN K2-VITAMIN D3 PO) Take 1 tablet by mouth daily.    Yes [provider]  Zinc 50 MG CAPS Take 50 mg by mouth daily.   Yes [provider]    Physical Exam: Vitals:   02/27/20 0600 02/27/20 0615 02/27/20 0630 02/27/20 0645  BP: 119/76 128/86 127/81 (!) 125/95  Pulse: 89 98 (!) 110 (!) 108  Resp: (!) 21 (!) 23 (!) 24 (!) 27  Temp:      TempSrc:      SpO2: 100% 99% 100% 99%  Weight:      Height:        General: 64 y.o. female resting in bed in NAD Eyes: PERRL, normal sclera ENMT: Nares patent w/o discharge, orophaynx clear, dentition normal, ears w/o discharge/lesions/ulcers Neck: Supple, trachea midline Cardiovascular: tachy irregular, +S1, S2, no m/g/r, equal pulses throughout Respiratory: CTABL, no w/r/r, normal WOB GI: BS+, NDNT, no masses noted, no organomegaly noted MSK: No e/c/c Skin: No rashes, bruises, ulcerations noted Neuro: A&O x 3, no focal deficits Psyc: Appropriate interaction and affect, calm/cooperative  Labs on Admission: I have personally reviewed following labs and imaging studies  CBC: Recent Labs  Lab 02/27/20 0108  WBC 8.6  HGB 13.1  HCT 39.9  MCV 89.5  PLT 401   Basic Metabolic Panel: Recent Labs  Lab 02/27/20 0108  NA 136  K 4.3  CL 99  CO2 27  GLUCOSE 144*  BUN 26*  CREATININE 1.38*  CALCIUM 9.1   GFR: Estimated Creatinine Clearance: 48.9 mL/min (A) (by C-G formula based on SCr of 1.38 mg/dL (H)). Liver Function Tests: No results for input(s): AST, ALT, ALKPHOS, BILITOT, PROT, ALBUMIN in the last 168 hours. No results for input(s): LIPASE, AMYLASE in the last 168 hours. No results for input(s): AMMONIA in the last 168 hours. Coagulation Profile: No results for input(s): INR, PROTIME in the last 168 hours. Cardiac Enzymes: No  results for input(s): CKTOTAL, CKMB, CKMBINDEX, TROPONINI in the last 168 hours. BNP (last 3 results) No results for input(s): PROBNP in the last 8760 hours. HbA1C: No results for input(s): HGBA1C in the last 72 hours. CBG: Recent Labs  Lab 02/27/20 0010  GLUCAP 154*   Lipid Profile: No results for input(s): CHOL, HDL, LDLCALC, TRIG, CHOLHDL, LDLDIRECT in the last 72 hours. Thyroid Function Tests: No results for input(s): TSH, T4TOTAL, FREET4, T3FREE, THYROIDAB in the last 72 hours. Anemia Panel: No results for input(s): VITAMINB12, FOLATE, FERRITIN, TIBC, IRON, RETICCTPCT in the last 72 hours. Urine analysis:    Component Value Date/Time   COLORURINE YELLOW 02/27/2020 0210   APPEARANCEUR CLEAR 02/27/2020 0210   LABSPEC 1.018 02/27/2020 0210   PHURINE 5.0 02/27/2020  0210   GLUCOSEU NEGATIVE 02/27/2020 0210   HGBUR NEGATIVE 02/27/2020 0210   BILIRUBINUR NEGATIVE 02/27/2020 0210   KETONESUR NEGATIVE 02/27/2020 0210   PROTEINUR 30 (A) 02/27/2020 0210   NITRITE NEGATIVE 02/27/2020 0210   LEUKOCYTESUR NEGATIVE 02/27/2020 0210    Radiological Exams on Admission: CT Angio Chest PE W and/or Wo Contrast  Result Date: 02/27/2020 CLINICAL DATA:  Syncope EXAM: CT ANGIOGRAPHY CHEST WITH CONTRAST TECHNIQUE: Multidetector CT imaging of the chest was performed using the standard protocol during bolus administration of intravenous contrast. Multiplanar CT image reconstructions and MIPs were obtained to evaluate the vascular anatomy. CONTRAST:  1mL OMNIPAQUE IOHEXOL 350 MG/ML SOLN COMPARISON:  None. FINDINGS: Cardiovascular: There is adequate opacification of the pulmonary arterial tree. There is no intraluminal filling defect identified to suggest acute pulmonary embolism. The central pulmonary arteries are enlarged in keeping with pulmonary arterial hypertension. There is extensive coronary artery calcification. There is extensive calcification of the mitral valve annulus. Mild calcification of  the aortic valve leaflets. There is mild global cardiomegaly. No pericardial effusion. Atherosclerotic calcification is seen within the thoracic aorta. No aortic aneurysm. Mediastinum/Nodes: No pathologic thoracic adenopathy. Esophagus is unremarkable. Lungs/Pleura: Trace right and small left pleural effusions are present. Mild bibasilar atelectasis. No superimposed confluent pulmonary infiltrate. No pneumothorax. Central airways are widely patent. Upper Abdomen: Unremarkable Musculoskeletal: No acute bone abnormality. Review of the MIP images confirms the above findings. IMPRESSION: No pulmonary embolism. Morphologic changes of pulmonary arterial hypertension. Mild global cardiomegaly.  Extensive coronary artery calcification. Extensive calcification of the mitral valve annulus noted. Echocardiography may be helpful to assess the degree of valvular dysfunction. Trace right and small left pleural effusions, slightly increased since prior examination. Aortic Atherosclerosis (ICD10-I70.0). Electronically Signed   By: Fidela Salisbury MD   On: 02/27/2020 06:07   DG Chest Portable 1 View  Result Date: 02/27/2020 CLINICAL DATA:  Syncope EXAM: PORTABLE CHEST 1 VIEW COMPARISON:  02/05/2020 FINDINGS: Lung volumes are small. Since the prior examination, a tiny left pleural effusion has developed. There is bibasilar atelectasis. No pneumothorax. Cardiac size within normal limits. Pulmonary vascularity is normal. No acute bone abnormality. IMPRESSION: Interval development of bibasilar atelectasis and tiny left pleural effusion. Electronically Signed   By: Fidela Salisbury MD   On: 02/27/2020 00:48    EKG: Independently reviewed. A fib, prolonged Qtc  Assessment/Plan Syncope     - place in obs, telemetry     - holding metoprolol for now, continue eliquis     - CTA PE is negative     - was scheduled for cardioversion 11/23. Spoke with cardiology, they will review her, appreciate their assistance     - hold NPO for  now in case cards decides to cardiovert today  Elevated troponin     - mild, trend, will ask cards to look     - no CP, and EKG shows afib, prolonged Qtc no ST changes  Prolonged Qtc     - K+, Mg2+ are ok     - watch Qtc prolonging meds  Hypothyroidism     - continue synthroid  DVT prophylaxis: eliquis  Code Status: Full   Family Communication: None at bedside  Consults called: Cardiology (Dr. Oval Linsey)  Status is: Observation  The patient remains OBS appropriate and will d/c before 2 midnights.  Dispo: The patient is from: Home              Anticipated d/c is to: Home  Anticipated d/c date is: 1 day              Patient currently is not medically stable to d/c.  Jonnie Finner DO [facility]  If 7PM-7AM, please contact night-coverage [email or web address]  02/27/2020, 7:14 AM

## 2020-02-27 NOTE — ED Provider Notes (Signed)
[facility] DEPT Provider Note   CSN: [id number] Arrival date & time: 02/26/20  2315     History Chief Complaint  Patient presents with  . Weakness    Cynthia Cook is a 64 y.o. female.  The history is provided by the patient.  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  Patient states she did not hit her.  No f/c/r.       Past Medical History:  Diagnosis Date  . Back pain   . Murmur   . PE (pulmonary embolism)   . Thyroid disease     Patient Active Problem List   Diagnosis Date Noted  . Syncope 02/27/2020  . Atrial fibrillation ([place]) 02/17/2020  . Class 2 obesity due to excess calories without serious comorbidity with body mass index (BMI) of 36.0 to 36.9 in adult 02/17/2020  . Hypothyroidism 02/06/2020  . Pulmonary emboli ([place]) 02/05/2020  . ARF (acute renal failure) ([place]) 02/05/2020    History reviewed. No pertinent surgical history.   OB History   No obstetric history on file.     Family History  Family history unknown: Yes    Social History   Tobacco Use  . Smoking status: Never Smoker  . Smokeless tobacco: Never Used  Vaping Use  . Vaping Use: Never used  Substance Use Topics  . Alcohol use: No  . Drug use: Not on file    Home Medications Prior to Admission medications   Medication Sig Start Date End Date Taking? Authorizing Provider  allopurinol (ZYLOPRIM) 100  MG tablet Take 100 mg by mouth daily as needed (gout flare).   Yes [provider]  apixaban (ELIQUIS) 5 MG TABS tablet Take 1 tablet (5 mg total) by mouth 2 (two) times daily. 02/07/20  Yes Domenic Polite, MD  ascorbic acid (VITAMIN C) 500 MG tablet Take 500 mg by mouth daily.   Yes [provider]  ASTRAGALUS PO Take 500 mg by mouth daily.   Yes [provider]  b complex vitamins capsule Take 1 capsule by mouth daily.   Yes [provider]  Coenzyme Q10 (COQ-10) 400 MG CAPS Take 400 mg by mouth daily.   Yes [provider]  furosemide (LASIX) 20 MG tablet Take 20 mg by mouth daily as needed for edema (shortness of breath).  11/07/19  Yes [provider]  Homeopathic Products (ARNICA EX) Apply 1 application topically daily as needed (knee pain).   Yes [provider]  levothyroxine (SYNTHROID) 50 MCG tablet Take 50 mcg by mouth daily.  09/11/18  Yes [provider]  MAGNESIUM GLYCINATE PO Take 400 mg by mouth daily.   Yes [provider]  metoprolol succinate (TOPROL-XL) 25 MG 24 hr tablet Take 0.5 tablets (12.5 mg total) by mouth daily. Patient taking differently: Take 12.5 mg by mouth daily. Will skip dose if feeling lightheaded / dizzy 02/18/20 03/19/20 Yes Couture, Cortni S, PA-C  Multiple Vitamins-Minerals (  MULTIVITAMIN WITH MINERALS) tablet Take 1 tablet by mouth daily.   Yes [provider]  Potassium 99 MG TABS Take 297 mg by mouth daily.   Yes [provider]  Selenium 100 MCG CAPS Take 100 mcg by mouth daily.    Yes [provider]  triamcinolone cream (KENALOG) 0.1 % Apply 1 application topically daily as needed (rash).  02/09/20  Yes [provider]  Turmeric Curcumin 500 MG CAPS Take 1,000 mg by mouth daily as needed (pain).   Yes [provider]  vitamin A 3 MG (10000 UNITS) capsule Take 10,000 Units by mouth daily.   Yes [provider]  Vitamin  D-Vitamin K (VITAMIN K2-VITAMIN D3 PO) Take 1 tablet by mouth daily.    Yes [provider]  Zinc 50 MG CAPS Take 50 mg by mouth daily.   Yes [provider]    Allergies    Codeine and Fentanyl  Review of Systems   Review of Systems  Constitutional:       Global weakness   HENT: Negative for congestion.   Eyes: Negative for visual disturbance.  Respiratory: Negative for shortness of breath.   Cardiovascular: Positive for syncope. Negative for chest pain.  Gastrointestinal: Negative for vomiting.  Genitourinary: Negative for dysuria.  Musculoskeletal: Positive for back pain.       Chronic back pain   Skin: Negative for rash.  Neurological: Negative for syncope and speech difficulty.  Psychiatric/Behavioral: Negative for confusion.  All other systems reviewed and are negative.   Physical Exam Updated Vital Signs BP 128/86   Pulse 98   Temp 98.5 F (36.9 C) (Oral)   Resp (!) 23   Ht 5\' 6"  (1.676 m)   Wt 104.3 kg   SpO2 99%   BMI 37.12 kg/m   Physical Exam Vitals and nursing note reviewed.  Constitutional:      Appearance: Normal appearance. She is not diaphoretic.  HENT:     Head: Normocephalic and atraumatic.     Nose: Nose normal.  Eyes:     Extraocular Movements: Extraocular movements intact.     Conjunctiva/sclera: Conjunctivae normal.     Pupils: Pupils are equal, round, and reactive to light.  Cardiovascular:     Rate and Rhythm: Tachycardia present. Rhythm irregular.     Pulses: Normal pulses.     Heart sounds: Normal heart sounds.  Pulmonary:     Effort: Pulmonary effort is normal.     Breath sounds: Normal breath sounds.  Abdominal:     General: Abdomen is flat. Bowel sounds are normal.     Palpations: Abdomen is soft.     Tenderness: There is no abdominal tenderness. There is no guarding or rebound.  Musculoskeletal:        General: Normal range of motion.     Cervical back: Normal range of motion and neck supple.     Right  lower leg: No edema.     Left lower leg: No edema.  Skin:    General: Skin is warm and dry.     Capillary Refill: Capillary refill takes less than 2 seconds.  Neurological:     General: No focal deficit present.     Mental Status: She is alert and oriented to person, place, and time.     Deep Tendon Reflexes: Reflexes normal.  Psychiatric:        Mood and Affect: Mood normal.        Behavior: Behavior normal.  ED Results / Procedures / Treatments   Labs (all labs ordered are listed, but only abnormal results are displayed) Results for orders placed or performed during the hospital encounter of 02/26/20  Resp Panel by RT-PCR (Flu A&B, Covid) Nasopharyngeal Swab   Specimen: Nasopharyngeal Swab; Nasopharyngeal(NP) swabs in vial transport medium  Result Value Ref Range   SARS Coronavirus 2 by RT PCR NEGATIVE NEGATIVE   Influenza A by PCR NEGATIVE NEGATIVE   Influenza B by PCR NEGATIVE NEGATIVE  Basic metabolic panel  Result Value Ref Range   Sodium 136 135 - 145 mmol/L   Potassium 4.3 3.5 - 5.1 mmol/L   Chloride 99 98 - 111 mmol/L   CO2 27 22 - 32 mmol/L   Glucose, Bld 144 (H) 70 - 99 mg/dL   BUN 26 (H) 8 - 23 mg/dL   Creatinine, Ser 1.38 (H) 0.44 - 1.00 mg/dL   Calcium 9.1 8.9 - 10.3 mg/dL   GFR, Estimated 42 (L) >60 mL/min   Anion gap 10 5 - 15  CBC  Result Value Ref Range   WBC 8.6 4.0 - 10.5 K/uL   RBC 4.46 3.87 - 5.11 MIL/uL   Hemoglobin 13.1 12.0 - 15.0 g/dL   HCT 39.9 36 - 46 %   MCV 89.5 80.0 - 100.0 fL   MCH 29.4 26.0 - 34.0 pg   MCHC 32.8 30.0 - 36.0 g/dL   RDW 13.7 11.5 - 15.5 %   Platelets 159 150 - 400 K/uL   nRBC 0.0 0.0 - 0.2 %  Urinalysis, Routine w reflex microscopic Urine, Catheterized  Result Value Ref Range   Color, Urine YELLOW YELLOW   APPearance CLEAR CLEAR   Specific Gravity, Urine 1.018 1.005 - 1.030   pH 5.0 5.0 - 8.0   Glucose, UA NEGATIVE NEGATIVE mg/dL   Hgb urine dipstick NEGATIVE NEGATIVE   Bilirubin Urine NEGATIVE NEGATIVE    Ketones, ur NEGATIVE NEGATIVE mg/dL   Protein, ur 30 (A) NEGATIVE mg/dL   Nitrite NEGATIVE NEGATIVE   Leukocytes,Ua NEGATIVE NEGATIVE   RBC / HPF 0-5 0 - 5 RBC/hpf   WBC, UA 0-5 0 - 5 WBC/hpf   Bacteria, UA NONE SEEN NONE SEEN   Mucus PRESENT    Hyaline Casts, UA PRESENT   D-dimer, quantitative (not at [facility])  Result Value Ref Range   D-Dimer, Quant 0.82 (H) 0.00 - 0.50 ug/mL-FEU  CBG monitoring, ED  Result Value Ref Range   Glucose-Capillary 154 (H) 70 - 99 mg/dL  Troponin I (High Sensitivity)  Result Value Ref Range   Troponin I (High Sensitivity) 21 (H) <18 ng/L  Troponin I (High Sensitivity)  Result Value Ref Range   Troponin I (High Sensitivity) 23 (H) <18 ng/L   DG Sacrum/Coccyx  Result Date: 02/18/2020 CLINICAL DATA:  Fall EXAM: SACRUM AND COCCYX - 2+ VIEW COMPARISON:  None. FINDINGS: Coccyx not visible due to beam attenuation by soft tissue. No sacral fracture. Femoral heads are located. No pelvic fracture. IMPRESSION: Coccyx not visible due to beam attenuation by soft tissue. No pelvic fracture. Electronically Signed   By: Ulyses Jarred M.D.   On: 02/18/2020 23:36   DG Elbow Complete Left  Result Date: 02/18/2020 CLINICAL DATA:  Fall EXAM: LEFT ELBOW - COMPLETE 3+ VIEW COMPARISON:  None. FINDINGS: There is no evidence of fracture, dislocation. A small elbow joint effusion is seen. Posterior olecranon enthesophyte is seen. There is diffuse osteopenia. Soft tissues are unremarkable. IMPRESSION: No definite displaced fracture seen. Small elbow  joint effusion. If pain persists, would recommend cross-sectional imaging for further evaluation. Electronically Signed   By: Prudencio Pair M.D.   On: 02/18/2020 21:49   CT HEAD WO CONTRAST  Result Date: 02/05/2020 CLINICAL DATA:  64 year old female with trauma. EXAM: CT HEAD WITHOUT CONTRAST CT CERVICAL SPINE WITHOUT CONTRAST TECHNIQUE: Multidetector CT imaging of the head and cervical spine was performed following the standard protocol  without intravenous contrast. Multiplanar CT image reconstructions of the cervical spine were also generated. COMPARISON:  None. FINDINGS: CT HEAD FINDINGS Brain: Mild age-related atrophy and chronic microvascular ischemic changes. There is no acute intracranial hemorrhage. No mass effect or midline shift. No extra-axial fluid collection. Vascular: No hyperdense vessel or unexpected calcification. Skull: Normal. Negative for fracture or focal lesion. Sinuses/Orbits: No acute finding. Other: None CT CERVICAL SPINE FINDINGS Alignment: No acute subluxation. Skull base and vertebrae: No acute fracture. Soft tissues and spinal canal: No prevertebral fluid or swelling. No visible canal hematoma. Disc levels:  Degenerative changes. Upper chest: There is pulmonary vascular prominence with cephalization, likely vascular congestion. Clinical correlation is recommended. Other: Bilateral carotid bulb calcified plaques. IMPRESSION: 1. No acute intracranial pathology. Mild age-related atrophy and chronic microvascular ischemic changes. 2. No acute/traumatic cervical spine pathology. Electronically Signed   By: Anner Crete M.D.   On: 02/05/2020 18:39   CT Angio Chest PE W and/or Wo Contrast  Result Date: 02/27/2020 CLINICAL DATA:  Syncope EXAM: CT ANGIOGRAPHY CHEST WITH CONTRAST TECHNIQUE: Multidetector CT imaging of the chest was performed using the standard protocol during bolus administration of intravenous contrast. Multiplanar CT image reconstructions and MIPs were obtained to evaluate the vascular anatomy. CONTRAST:  60mL OMNIPAQUE IOHEXOL 350 MG/ML SOLN COMPARISON:  None. FINDINGS: Cardiovascular: There is adequate opacification of the pulmonary arterial tree. There is no intraluminal filling defect identified to suggest acute pulmonary embolism. The central pulmonary arteries are enlarged in keeping with pulmonary arterial hypertension. There is extensive coronary artery calcification. There is extensive  calcification of the mitral valve annulus. Mild calcification of the aortic valve leaflets. There is mild global cardiomegaly. No pericardial effusion. Atherosclerotic calcification is seen within the thoracic aorta. No aortic aneurysm. Mediastinum/Nodes: No pathologic thoracic adenopathy. Esophagus is unremarkable. Lungs/Pleura: Trace right and small left pleural effusions are present. Mild bibasilar atelectasis. No superimposed confluent pulmonary infiltrate. No pneumothorax. Central airways are widely patent. Upper Abdomen: Unremarkable Musculoskeletal: No acute bone abnormality. Review of the MIP images confirms the above findings. IMPRESSION: No pulmonary embolism. Morphologic changes of pulmonary arterial hypertension. Mild global cardiomegaly.  Extensive coronary artery calcification. Extensive calcification of the mitral valve annulus noted. Echocardiography may be helpful to assess the degree of valvular dysfunction. Trace right and small left pleural effusions, slightly increased since prior examination. Aortic Atherosclerosis (ICD10-I70.0). Electronically Signed   By: Fidela Salisbury MD   On: 02/27/2020 06:07   CT Angio Chest PE W and/or Wo Contrast  Result Date: 02/05/2020 CLINICAL DATA:  Positive D-dimer, hypoxia, syncope, history of prior PE EXAM: CT ANGIOGRAPHY CHEST WITH CONTRAST TECHNIQUE: Multidetector CT imaging of the chest was performed using the standard protocol during bolus administration of intravenous contrast. Multiplanar CT image reconstructions and MIPs were obtained to evaluate the vascular anatomy. CONTRAST:  53mL OMNIPAQUE IOHEXOL 350 MG/ML SOLN COMPARISON:  Radiograph 02/05/2020 FINDINGS: Cardiovascular: While there is satisfactory central pulmonary arterial opacification. Contrast opacification of pulmonary arteries is significantly diminished beyond the central and lobar segments. However, there are several areas which are concerning for pulmonary artery emboli. The first  being  some thin linear almost web-like filling defects present in the right intralobar pulmonary artery which could suggest some chronic embolic disease albeit with more expanded and unopacified segmental branches in the posteromedial basal segments of the right lower lobe as well as several segmental branches in the left lower lobe as well. Central pulmonary arteries are top-normal caliber. RV/LV ratio is maintained. Cardiac size is top normal. Three-vessel coronary artery calcifications are present. Dense calcification is present in the mitral annulus. Additional calcifications are present upon the aortic leaflets. Atherosclerotic plaque within the normal caliber aorta. Suboptimal opacification for luminal evaluation of the aorta. No gross aortic abnormality is seen. Normal 3 vessel branching of the aortic arch. Proximal great vessels are calcified and slightly tortuous but otherwise unremarkable. No major venous abnormalities are seen. Mediastinum/Nodes: No mediastinal fluid or gas. Normal thyroid gland and thoracic inlet. No acute abnormality of the trachea. Small hiatal hernia without other acute esophageal abnormality. No worrisome mediastinal, hilar or axillary adenopathy. Lungs/Pleura: Lung volumes are low with dependent atelectatic change and additional bandlike areas of opacity in both lower lobes and the right middle lobe likely reflecting further subsegmental atelectasis or scarring. No pneumothorax or pleural effusion. Some mild mosaic attenuation may reflect air trapping or atelectatic change accentuated by imaging during exhalation. 7 mm juxta phrenic nodule seen in the right lower lobe (6/84). No other concerning pulmonary nodules or masses. Upper Abdomen: No acute abnormalities present in the visualized portions of the upper abdomen. Musculoskeletal: Multilevel degenerative changes are present in the imaged portions of the spine. Additional degenerative changes in the shoulders. No worrisome chest wall  lesions. Review of the MIP images confirms the above findings. IMPRESSION: 1. Excellent opacification of the central lobar pulmonary arteries however there is mixing artifact within the more distal segmental and subsegmental branches. Web-like filling defects are seen in the right intralobar pulmonary artery which could suggest some chronic pulmonary embolus in this patient history of prior embolic disease. Suspect additional filling defects within segmental and subsegmental branches of both lower lobes though these may be accentuated by mixing artifact. No convincing CT evidence of right heart strain at this time. 2. Cardiomegaly. Extensive calcifications upon the mitral annulus and to a lesser extent the aortic leaflets may warrant further evaluation with echocardiography to assess for underlying valvular dysfunction. Three-vessel coronary artery calcifications. 3. Low lung volumes and basilar atelectatic changes with some more mosaic attenuation likely reflecting further atelectasis or air trapping. 4. 7 mm juxta phrenic nodule in the right lower lobe. Non-contrast chest CT at 6-12 months is recommended. If the nodule is stable at time of repeat CT, then future CT at 18-24 months (from today's scan) is considered optional for low-risk patients, but is recommended for high-risk patients. This recommendation follows the consensus statement: Guidelines for Management of Incidental Pulmonary Nodules Detected on CT Images: From the Fleischner Society 2017; Radiology 2017; 284:228-243. 5. Aortic Atherosclerosis (ICD10-I70.0). Critical Value/emergent results were called by telephone at the time of interpretation on 02/05/2020 at 9:12 pm to provider [facility] , who verbally acknowledged these results. Electronically Signed   By: Lovena Le M.D.   On: 02/05/2020 21:13   CT Cervical Spine Wo Contrast  Result Date: 02/05/2020 CLINICAL DATA:  64 year old female with trauma. EXAM: CT HEAD WITHOUT CONTRAST CT CERVICAL  SPINE WITHOUT CONTRAST TECHNIQUE: Multidetector CT imaging of the head and cervical spine was performed following the standard protocol without intravenous contrast. Multiplanar CT image reconstructions of the cervical spine were also  generated. COMPARISON:  None. FINDINGS: CT HEAD FINDINGS Brain: Mild age-related atrophy and chronic microvascular ischemic changes. There is no acute intracranial hemorrhage. No mass effect or midline shift. No extra-axial fluid collection. Vascular: No hyperdense vessel or unexpected calcification. Skull: Normal. Negative for fracture or focal lesion. Sinuses/Orbits: No acute finding. Other: None CT CERVICAL SPINE FINDINGS Alignment: No acute subluxation. Skull base and vertebrae: No acute fracture. Soft tissues and spinal canal: No prevertebral fluid or swelling. No visible canal hematoma. Disc levels:  Degenerative changes. Upper chest: There is pulmonary vascular prominence with cephalization, likely vascular congestion. Clinical correlation is recommended. Other: Bilateral carotid bulb calcified plaques. IMPRESSION: 1. No acute intracranial pathology. Mild age-related atrophy and chronic microvascular ischemic changes. 2. No acute/traumatic cervical spine pathology. Electronically Signed   By: Anner Crete M.D.   On: 02/05/2020 18:39   DG Chest Portable 1 View  Result Date: 02/27/2020 CLINICAL DATA:  Syncope EXAM: PORTABLE CHEST 1 VIEW COMPARISON:  02/05/2020 FINDINGS: Lung volumes are small. Since the prior examination, a tiny left pleural effusion has developed. There is bibasilar atelectasis. No pneumothorax. Cardiac size within normal limits. Pulmonary vascularity is normal. No acute bone abnormality. IMPRESSION: Interval development of bibasilar atelectasis and tiny left pleural effusion. Electronically Signed   By: Fidela Salisbury MD   On: 02/27/2020 00:48   DG Chest Portable 1 View  Result Date: 02/05/2020 CLINICAL DATA:  64 year old female with shortness of  breath. EXAM: PORTABLE CHEST 1 VIEW COMPARISON:  None. FINDINGS: Minimal bibasilar atelectasis. No focal consolidation, pleural effusion, pneumothorax. The cardiac silhouette is within limits. Atherosclerotic calcification of the aorta. No acute osseous pathology. IMPRESSION: No active disease. Electronically Signed   By: Anner Crete M.D.   On: 02/05/2020 20:21   ECHOCARDIOGRAM COMPLETE  Result Date: 02/06/2020    ECHOCARDIOGRAM REPORT   Patient Name:   Cynthia Cook Date of Exam: 02/06/2020 Medical Rec #:  [id number]      Height:       66.0 in Accession #:    [id number]     Weight:       228.0 lb Date of Birth:  [birth year]       BSA:          2.114 m Patient Age:    4 years       BP:           106/70 mmHg Patient Gender: F              HR:           120 bpm. Exam Location:  Inpatient Procedure: 2D Echo, 3D Echo, Cardiac Doppler and Color Doppler Indications:    I26.02 Pulmonary embolus  History:        Patient has prior history of Echocardiogram examinations, most                 recent 05/02/2018. Abnormal ECG, Aortic Valve Disease;                 Arrythmias:Atrial Fibrillation.  Sonographer:    Roseanna Rainbow RDCS Referring Phys: Newman  Sonographer Comments: Technically difficult study due to poor echo windows. Image acquisition challenging due to patient body habitus. IMPRESSIONS  1. Left ventricular ejection fraction, by estimation, is 55 to 60%. The left ventricle has normal function. The left ventricle has no regional wall motion abnormalities. There is mild concentric left ventricular hypertrophy. Left ventricular diastolic function could not be evaluated.  2. Moderate calcific mitral  stenosis is present. MG 5.6 mmHG @ 109 bpm. MVA by VTI 1.86 cm2. Mild to moderate MR is present which is related to restricted movement of the PMVL (IIIB). The mitral valve is degenerative. Mild to moderate mitral valve regurgitation. Moderate mitral stenosis. Severe mitral annular calcification.  3.  Mild to moderate AS is present. V max 2.25 m/s, MG 12.3 mmHG, AVA 1.29 cm2, DI 0.34. Gradients lower than expected due to low SVI (SV=56 cc, SVI=26 cc/m2). The aortic valve is tricuspid. There is moderate calcification of the aortic valve. There is moderate thickening of the aortic valve. Aortic valve regurgitation is not visualized. Mild to moderate aortic valve stenosis. Aortic valve area, by VTI measures 1.29 cm. Aortic valve mean gradient measures 12.3 mmHg. Aortic valve Vmax measures 2.25 m/s.  4. Right ventricular systolic function is normal. The right ventricular size is mildly enlarged. There is mildly elevated pulmonary artery systolic pressure. The estimated right ventricular systolic pressure is 93.9 mmHg.  5. Left atrial size was severely dilated.  6. The inferior vena cava is dilated in size with <50% respiratory variability, suggesting right atrial pressure of 15 mmHg. Comparison(s): Changes from prior study are noted. Afib with RVR is now present. Valvular heart disease is stable. FINDINGS  Left Ventricle: Left ventricular ejection fraction, by estimation, is 55 to 60%. The left ventricle has normal function. The left ventricle has no regional wall motion abnormalities. The left ventricular internal cavity size was normal in size. There is  mild concentric left ventricular hypertrophy. Left ventricular diastolic function could not be evaluated due to atrial fibrillation. Left ventricular diastolic function could not be evaluated. Right Ventricle: The right ventricular size is mildly enlarged. No increase in right ventricular wall thickness. Right ventricular systolic function is normal. There is mildly elevated pulmonary artery systolic pressure. The tricuspid regurgitant velocity is 2.28 m/s, and with an assumed right atrial pressure of 15 mmHg, the estimated right ventricular systolic pressure is 03.0 mmHg. Left Atrium: Left atrial size was severely dilated. Right Atrium: Right atrial size was  normal in size. Pericardium: Trivial pericardial effusion is present. Presence of pericardial fat pad. Mitral Valve: Moderate calcific mitral stenosis is present. MG 5.6 mmHG @ 109 bpm. MVA by VTI 1.86 cm2. Mild to moderate MR is present which is related to restricted movement of the PMVL (IIIB). The mitral valve is degenerative in appearance. Severe mitral annular calcification. Mild to moderate mitral valve regurgitation. Moderate mitral valve stenosis. MV peak gradient, 14.8 mmHg. The mean mitral valve gradient is 5.6 mmHg with average heart rate of 109 bpm. Tricuspid Valve: The tricuspid valve is grossly normal. Tricuspid valve regurgitation is trivial. No evidence of tricuspid stenosis. Aortic Valve: Mild to moderate AS is present. V max 2.25 m/s, MG 12.3 mmHG, AVA 1.29 cm2, DI 0.34. Gradients lower than expected due to low SVI (SV=56 cc, SVI=26 cc/m2). The aortic valve is tricuspid. There is moderate calcification of the aortic valve. There is moderate thickening of the aortic valve. Aortic valve regurgitation is not visualized. Mild to moderate aortic stenosis is present. Aortic valve mean gradient measures 12.3 mmHg. Aortic valve peak gradient measures 20.3 mmHg. Aortic valve area, by VTI measures 1.29 cm. Pulmonic Valve: The pulmonic valve was grossly normal. Pulmonic valve regurgitation is not visualized. No evidence of pulmonic stenosis. Aorta: The aortic root and ascending aorta are structurally normal, with no evidence of dilitation. Venous: The inferior vena cava is dilated in size with less than 50% respiratory variability, suggesting right atrial  pressure of 15 mmHg. IAS/Shunts: The atrial septum is grossly normal.  LEFT VENTRICLE PLAX 2D LVIDd:         4.80 cm LVIDs:         3.90 cm LV PW:         1.70 cm LV IVS:        1.40 cm LVOT diam:     2.20 cm LV SV:         56 LV SV Index:   26 LVOT Area:     3.80 cm  LV Volumes (MOD) LV vol d, MOD A2C: 96.5 ml LV vol d, MOD A4C: 68.9 ml LV vol s, MOD A2C:  58.2 ml LV vol s, MOD A4C: 33.1 ml LV SV MOD A2C:     38.3 ml LV SV MOD A4C:     68.9 ml LV SV MOD BP:      41.1 ml RIGHT VENTRICLE            IVC RV S prime:     6.96 cm/s  IVC diam: 2.50 cm TAPSE (M-mode): 2.0 cm LEFT ATRIUM              Index        RIGHT ATRIUM           Index LA diam:        4.50 cm  2.13 cm/m   RA Area:     19.00 cm LA Vol (A2C):   220.0 ml 104.05 ml/m RA Volume:   61.20 ml  28.95 ml/m LA Vol (A4C):   127.0 ml 60.07 ml/m LA Biplane Vol: 176.0 ml 83.24 ml/m  AORTIC VALVE AV Area (Vmax):    1.58 cm AV Area (Vmean):   1.27 cm AV Area (VTI):     1.29 cm AV Vmax:           225.33 cm/s AV Vmean:          166.667 cm/s AV VTI:            0.435 m AV Peak Grad:      20.3 mmHg AV Mean Grad:      12.3 mmHg LVOT Vmax:         93.80 cm/s LVOT Vmean:        55.900 cm/s LVOT VTI:          0.147 m LVOT/AV VTI ratio: 0.34  AORTA Ao Root diam: 3.50 cm Ao Asc diam:  3.80 cm MITRAL VALVE                TRICUSPID VALVE MV Area (PHT): 3.29 cm     TR Peak grad:   20.8 mmHg MV Area VTI:   1.86 cm     TR Vmax:        228.00 cm/s MV Peak grad:  14.8 mmHg MV Mean grad:  5.6 mmHg     SHUNTS MV Vmax:       1.92 m/s     Systemic VTI:  0.15 m MV Vmean:      98.8 cm/s    Systemic Diam: 2.20 cm MV VTI:        0.30 m MV Decel Time: 231 msec MR Peak grad: 108.2 mmHg MR Mean grad: 71.0 mmHg MR Vmax:      520.00 cm/s MR Vmean:     405.0 cm/s MV E velocity: 165.00 cm/s Eleonore Chiquito MD Electronically signed by Eleonore Chiquito MD Signature Date/Time: 02/06/2020/11:41:22 AM    Final  VAS [place] LOWER EXTREMITY VENOUS (DVT)  Result Date: 02/06/2020  Lower Venous DVTStudy Indications: Pulmonary embolism.  Anticoagulation: Heparin. Limitations: Poor ultrasound/tissue interface. Comparison Study: No prior studies. Performing Technologist: Darlin Coco  Examination Guidelines: A complete evaluation includes B-mode imaging, spectral Doppler, color Doppler, and power Doppler as needed of all accessible portions of each vessel.  Bilateral testing is considered an integral part of a complete examination. Limited examinations for reoccurring indications may be performed as noted. The reflux portion of the exam is performed with the patient in reverse Trendelenburg.  +---------+---------------+---------+-----------+----------+--------------+ RIGHT    CompressibilityPhasicitySpontaneityPropertiesThrombus Aging +---------+---------------+---------+-----------+----------+--------------+ CFV      Full           Yes      Yes                                 +---------+---------------+---------+-----------+----------+--------------+ SFJ      Full                                                        +---------+---------------+---------+-----------+----------+--------------+ FV Prox  Full                                                        +---------+---------------+---------+-----------+----------+--------------+ FV Mid   Full                                                        +---------+---------------+---------+-----------+----------+--------------+ FV DistalFull                                                        +---------+---------------+---------+-----------+----------+--------------+ PFV      Full                                                        +---------+---------------+---------+-----------+----------+--------------+ POP      Full           Yes      Yes                                 +---------+---------------+---------+-----------+----------+--------------+ PTV      Full                                                        +---------+---------------+---------+-----------+----------+--------------+ PERO     Full                                                        +---------+---------------+---------+-----------+----------+--------------+   +---------+---------------+---------+-----------+----------+--------------+  LEFT      CompressibilityPhasicitySpontaneityPropertiesThrombus Aging +---------+---------------+---------+-----------+----------+--------------+ CFV      Full           Yes      Yes                                 +---------+---------------+---------+-----------+----------+--------------+ SFJ      Full                                                        +---------+---------------+---------+-----------+----------+--------------+ FV Prox  Full                                                        +---------+---------------+---------+-----------+----------+--------------+ FV Mid   Full                                                        +---------+---------------+---------+-----------+----------+--------------+ FV DistalFull                                                        +---------+---------------+---------+-----------+----------+--------------+ PFV      Full                                                        +---------+---------------+---------+-----------+----------+--------------+ POP      Full           Yes      Yes                                 +---------+---------------+---------+-----------+----------+--------------+ PTV      Full                                                        +---------+---------------+---------+-----------+----------+--------------+ PERO                                                  Not visualized +---------+---------------+---------+-----------+----------+--------------+     Summary: RIGHT: - There is no evidence of deep vein thrombosis in the lower extremity.  - No cystic structure found in the popliteal fossa.  LEFT: - There is no evidence of deep vein thrombosis in the lower extremity. However, portions of this examination were limited- see technologist comments above.  -  A cystic structure is found in the popliteal fossa.  *See table(s) above for measurements and observations. Electronically signed  by Deitra Mayo MD on 02/06/2020 at 1:03:21 PM.    Final     EKG EKG Interpretation  Date/Time:  Friday February 27 2020 00:05:55 EST Ventricular Rate:  121 PR Interval:    QRS Duration: 104 QT Interval:  369 QTC Calculation: 524 R Axis:   87 Text Interpretation: Atrial fibrillation Borderline right axis deviation Prolonged QT interval Confirmed by Dory Horn) on 02/27/2020 1:15:20 AM   Radiology CT Angio Chest PE W and/or Wo Contrast  Result Date: 02/27/2020 CLINICAL DATA:  Syncope EXAM: CT ANGIOGRAPHY CHEST WITH CONTRAST TECHNIQUE: Multidetector CT imaging of the chest was performed using the standard protocol during bolus administration of intravenous contrast. Multiplanar CT image reconstructions and MIPs were obtained to evaluate the vascular anatomy. CONTRAST:  3mL OMNIPAQUE IOHEXOL 350 MG/ML SOLN COMPARISON:  None. FINDINGS: Cardiovascular: There is adequate opacification of the pulmonary arterial tree. There is no intraluminal filling defect identified to suggest acute pulmonary embolism. The central pulmonary arteries are enlarged in keeping with pulmonary arterial hypertension. There is extensive coronary artery calcification. There is extensive calcification of the mitral valve annulus. Mild calcification of the aortic valve leaflets. There is mild global cardiomegaly. No pericardial effusion. Atherosclerotic calcification is seen within the thoracic aorta. No aortic aneurysm. Mediastinum/Nodes: No pathologic thoracic adenopathy. Esophagus is unremarkable. Lungs/Pleura: Trace right and small left pleural effusions are present. Mild bibasilar atelectasis. No superimposed confluent pulmonary infiltrate. No pneumothorax. Central airways are widely patent. Upper Abdomen: Unremarkable Musculoskeletal: No acute bone abnormality. Review of the MIP images confirms the above findings. IMPRESSION: No pulmonary embolism. Morphologic changes of pulmonary arterial hypertension.  Mild global cardiomegaly.  Extensive coronary artery calcification. Extensive calcification of the mitral valve annulus noted. Echocardiography may be helpful to assess the degree of valvular dysfunction. Trace right and small left pleural effusions, slightly increased since prior examination. Aortic Atherosclerosis (ICD10-I70.0). Electronically Signed   By: Fidela Salisbury MD   On: 02/27/2020 06:07   DG Chest Portable 1 View  Result Date: 02/27/2020 CLINICAL DATA:  Syncope EXAM: PORTABLE CHEST 1 VIEW COMPARISON:  02/05/2020 FINDINGS: Lung volumes are small. Since the prior examination, a tiny left pleural effusion has developed. There is bibasilar atelectasis. No pneumothorax. Cardiac size within normal limits. Pulmonary vascularity is normal. No acute bone abnormality. IMPRESSION: Interval development of bibasilar atelectasis and tiny left pleural effusion. Electronically Signed   By: Fidela Salisbury MD   On: 02/27/2020 00:48    Procedures Procedures (including critical care time)  Medications Ordered in ED Medications  acetaminophen (TYLENOL) tablet 1,000 mg (1,000 mg Oral Given 02/27/20 0135)  sodium chloride (PF) 0.9 % injection (10 mLs  Given 02/27/20 0456)  iohexol (OMNIPAQUE) 350 MG/ML injection 100 mL (80 mLs Intravenous Contrast Given 02/27/20 0518)  ketorolac (TORADOL) 30 MG/ML injection 15 mg (15 mg Intravenous Given 02/27/20 0514)    ED Course  I have reviewed the triage vital signs and the nursing notes.  Pertinent labs & imaging results that were available during my care of the patient were reviewed by me and considered in my medical decision making (see chart for details).    Patient with Syncope, on anticoagulation.  Ruled out for PE with negative CTA. Will need further work up for syncope and elevated troponin. Final Clinical Impression(s) / ED Diagnoses Final diagnoses:  Syncope, unspecified syncope type  Atrial fibrillation, unspecified type (HCC)  Elevated  troponin I  level  admit to medicine    Tykesha Konicki, MD 02/27/20 [phone]

## 2020-02-27 NOTE — ED Notes (Signed)
Asked MD about diet order. Patient has to stay NPO

## 2020-02-27 NOTE — ED Notes (Signed)
Pt. Requested in and out cath. EDP, Palumbo,MD. Made aware.

## 2020-02-27 NOTE — Progress Notes (Signed)
Chaplain responding to page for patient support.    On chaplain arrival, Pt presents with anxious affect.  She states that she does not wish to speak with chaplain now, as "we only have about a minute... and I don't want to start crying."   Did not wish to share further.  Requests chaplain prayers.  Chaplain proceeded to share prayers with pt and she stops chaplain and asks for chaplain to pray outside room, as "I don't want to start crying and it will be unhealthy for them to do procedure."   Chaplain expressed support and assured patient that they would pray for patient during procedure.   Jerene Pitch, MDiv. [facility]  [facility]

## 2020-02-27 NOTE — ED Notes (Signed)
Patient refused vitals until after walking to restroom. NP in room at this time. Will ambulated patient to restroom after NP finishes.

## 2020-02-27 NOTE — ED Notes (Signed)
Patient is back at baseline, talking in full sentence, A&O x4. Patient is able to move all 4 extremities. arousalable to spontaneous stimuli.

## 2020-02-28 DIAGNOSIS — R55 Syncope and collapse: Secondary | ICD-10-CM | POA: Diagnosis not present

## 2020-02-28 DIAGNOSIS — I4819 Other persistent atrial fibrillation: Secondary | ICD-10-CM

## 2020-02-28 DIAGNOSIS — I4891 Unspecified atrial fibrillation: Secondary | ICD-10-CM | POA: Diagnosis not present

## 2020-02-28 LAB — COMPREHENSIVE METABOLIC PANEL
ALT: 11 U/L (ref 0–44)
AST: 17 U/L (ref 15–41)
Albumin: 3.6 g/dL (ref 3.5–5.0)
Alkaline Phosphatase: 64 U/L (ref 38–126)
Anion gap: 11 (ref 5–15)
BUN: 40 mg/dL — ABNORMAL HIGH (ref 8–23)
CO2: 28 mmol/L (ref 22–32)
Calcium: 9.1 mg/dL (ref 8.9–10.3)
Chloride: 100 mmol/L (ref 98–111)
Creatinine, Ser: 1.64 mg/dL — ABNORMAL HIGH (ref 0.44–1.00)
GFR, Estimated: 34 mL/min — ABNORMAL LOW (ref >60)
Glucose, Bld: 98 mg/dL (ref 70–99)
Potassium: 4.7 mmol/L (ref 3.5–5.1)
Sodium: 139 mmol/L (ref 135–145)
Total Bilirubin: 1.2 mg/dL (ref 0.3–1.2)
Total Protein: 6.3 g/dL — ABNORMAL LOW (ref 6.5–8.1)

## 2020-02-28 LAB — CBC
HCT: 38.7 % (ref 36.0–46.0)
Hemoglobin: 12.6 g/dL (ref 12.0–15.0)
MCH: 29.6 pg (ref 26.0–34.0)
MCHC: 32.6 g/dL (ref 30.0–36.0)
MCV: 91.1 fL (ref 80.0–100.0)
Platelets: 174 10*3/uL (ref 150–400)
RBC: 4.25 MIL/uL (ref 3.87–5.11)
RDW: 14 % (ref 11.5–15.5)
WBC: 6.9 10*3/uL (ref 4.0–10.5)
nRBC: 0 % (ref 0.0–0.2)
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
## 2020-02-28 NOTE — Discharge Summary (Signed)
Physician Discharge Summary  Grainne Knights ZYS:[id number] DOB: September 28, 1955 DOA: 02/26/2020  PCP: Eunice Blase, MD  Admit date: 02/26/2020 Discharge date: 02/28/2020  Time spent: 40 minutes  Recommendations for Outpatient Follow-up:  1. Follow outpatient CBC/CMP 2. Follow volume status (holding lasix on discharge) - CT scan with trace R and small L effusions (increased from prior exam), will need to follow up imaging  3. Follow up with cardiology/afib clinic outpatient 4. Follow EKG outpatient for prolonged QTc  Discharge Diagnoses:  Active Problems:   Syncope   Discharge Condition: stable  Diet recommendation: heart healthy  Filed Weights   02/26/20 2340  Weight: 104.3 kg   History of present illness:  Shronda Boeh is Victoriah Wilds 64 y.o. female with medical history significant of Ariston Grandison fib. Presenting after syncopal episode. Multiple syncopal episodes over the last month. Her workup over that time has only showed Christion Leonhard fib. She was started on metoprolol and eliquis. She was scheduled for cardioversion on 11/23. Last night she had another syncopal episode. She was standing in front of her recliner when she felt "the lights dimming" and fell into her chair. She was out for Felicia Bloomquist few minutes. No CP, palpitations, or head injury. She called for assistance after waking up and was brought to the hospital.   Of note, she has had difficulty with her metoprolol. She was sensitive to the initial dosing. It was then cut in half. She has been holding that medication if her HR is less than 100 or her SBP less than 90. She reports that she did take it 1 hour prior to her syncopal episode. She notes that her second syncopal episode in the past month was blamed on her metoprolol being too strong for her.   She presented with syncope in the setting of afib and possible orthostasis on metoprolol.  She was cardioverted by cardiology.  She's improved and in sinus rhythm on 11/20, plan for d/c with outpatient follow up  with cardiology.  Stop metoprolol.  Continue eliquis.  See below for additional details  Hospital Course:  Syncope     - ? Related to orthostatic hypotension in setting of metoprolol      - metoprolol d/c'd - hold lasix until outpatient follow up      - CTA PE is negative for PE, echo from 10/29 with normal EF, mitral stenosis, mild to moderate AS - defer to cards     - follow outpatient after cardioversion while holding BP meds     - ok for d/c today, denies any LH, orthostasis  Atrial Fibrillation      - continue eliquis      - s/p DCCV 11/19, in sinus      - hold metoprolol       - per cards, favor rhythm control strategy - outpatient follow up   Trace Right and Small L Pleural Effusions       - follow outpatient, lasix currently on hold  Elevated troponin     - mild, trend, will ask cards to look     - no CP, and EKG shows afib, prolonged Qtc no ST changes  Prolonged Qtc     - K+, Mg2+ are ok     - watch Qtc prolonging meds     - follow EKG outpatient   Hypothyroidism     - continue synthroid  Procedures: Cardioversion 11/19  Consultations:  cardiology  Discharge Exam: Vitals:   02/27/20 2100 02/28/20 0518  BP: 116/66 123/74  Pulse: 84 84  Resp: 20 20  Temp: 98.8 F (37.1 C) 98.4 F (36.9 C)  SpO2: 97% 91%   Feeling better Denies LH, CP Ready and eager for d/c  General: No acute distress. Cardiovascular: Heart sounds show Michalla Ringer regular rate, and rhythm Lungs: Clear to auscultation bilaterally Abdomen: Soft, nontender, nondistended . Neurological: Alert and oriented 3. Moves all extremities 4 . Cranial nerves II through XII grossly intact. Skin: Warm and dry. No rashes or lesions. Extremities: No clubbing or cyanosis. No edema. Discharge Instructions   Discharge Instructions    Call MD for:  difficulty breathing, headache or visual disturbances   Complete by: As directed    Call MD for:  extreme fatigue   Complete by: As directed    Call MD  for:  hives   Complete by: As directed    Call MD for:  persistant dizziness or light-headedness   Complete by: As directed    Call MD for:  persistant nausea and vomiting   Complete by: As directed    Call MD for:  redness, tenderness, or signs of infection (pain, swelling, redness, odor or green/yellow discharge around incision site)   Complete by: As directed    Call MD for:  severe uncontrolled pain   Complete by: As directed    Call MD for:  temperature >100.4   Complete by: As directed    Diet - low sodium heart healthy   Complete by: As directed    Discharge instructions   Complete by: As directed    You were seen for syncope (fainting).  It's suspected that this was related to your blood pressure medicines.  Stop the metoprolol.  Hold your lasix until you follow outpatient with your PCP.  Weigh yourself daily and call your PCP if you gain more than 2-3 lbs in 1 day or more than 5 lbs in 1 week.   You should get Molina Hollenback repeat EKG as an outpatient.   Return for new, recurrent, or worsening symptoms.  Please ask your PCP to request records from this hospitalization so they know what was done and what the next steps will be.   Increase activity slowly   Complete by: As directed      Allergies as of 02/28/2020      Reactions   Codeine Nausea Only   Fentanyl Nausea And Vomiting   Dizziness, lightheaded       Medication List    STOP taking these medications   furosemide 20 MG tablet Commonly known as: LASIX   metoprolol succinate 25 MG 24 hr tablet Commonly known as: TOPROL-XL     TAKE these medications   allopurinol 100 MG tablet Commonly known as: ZYLOPRIM Take 100 mg by mouth daily as needed (gout flare).   apixaban 5 MG Tabs tablet Commonly known as: ELIQUIS Take 1 tablet (5 mg total) by mouth 2 (two) times daily.   ARNICA EX Apply 1 application topically daily as needed (knee pain).   ascorbic acid 500 MG tablet Commonly known as: VITAMIN C Take 500 mg by  mouth daily.   ASTRAGALUS PO Take 500 mg by mouth daily.   b complex vitamins capsule Take 1 capsule by mouth daily.   CoQ-10 400 MG Caps Take 400 mg by mouth daily.   levothyroxine 50 MCG tablet Commonly known as: SYNTHROID Take 50 mcg by mouth daily.   MAGNESIUM GLYCINATE PO Take 400 mg by mouth daily.   multivitamin with minerals tablet Take 1  tablet by mouth daily.   Potassium 99 MG Tabs Take 297 mg by mouth daily.   Selenium 100 MCG Caps Take 100 mcg by mouth daily.   triamcinolone 0.1 % Commonly known as: KENALOG Apply 1 application topically daily as needed (rash).   Turmeric Curcumin 500 MG Caps Take 1,000 mg by mouth daily as needed (pain).   vitamin Meri Pelot 3 MG (10000 UNITS) capsule Take 10,000 Units by mouth daily.   VITAMIN K2-VITAMIN D3 PO Take 1 tablet by mouth daily.   Zinc 50 MG Caps Take 50 mg by mouth daily.      Allergies  Allergen Reactions  . Codeine Nausea Only  . Fentanyl Nausea And Vomiting    Dizziness, lightheaded     Follow-up Information    Buford Dresser, MD Follow up on 03/16/2020.   Specialty: Cardiology Why: at 1:20 pm Contact information: [address] [place] [address] [phone]                The results of significant diagnostics from this hospitalization (including imaging, microbiology, ancillary and laboratory) are listed below for reference.    Significant Diagnostic Studies: DG Sacrum/Coccyx  Result Date: 02/18/2020 CLINICAL DATA:  Fall EXAM: SACRUM AND COCCYX - 2+ VIEW COMPARISON:  None. FINDINGS: Coccyx not visible due to beam attenuation by soft tissue. No sacral fracture. Femoral heads are located. No pelvic fracture. IMPRESSION: Coccyx not visible due to beam attenuation by soft tissue. No pelvic fracture. Electronically Signed   By: Ulyses Jarred M.D.   On: 02/18/2020 23:36   DG Elbow Complete Left  Result Date: 02/18/2020 CLINICAL DATA:  Fall EXAM: LEFT ELBOW - COMPLETE  3+ VIEW COMPARISON:  None. FINDINGS: There is no evidence of fracture, dislocation. Linda Biehn small elbow joint effusion is seen. Posterior olecranon enthesophyte is seen. There is diffuse osteopenia. Soft tissues are unremarkable. IMPRESSION: No definite displaced fracture seen. Small elbow joint effusion. If pain persists, would recommend cross-sectional imaging for further evaluation. Electronically Signed   By: Prudencio Pair M.D.   On: 02/18/2020 21:49   CT HEAD WO CONTRAST  Result Date: 02/05/2020 CLINICAL DATA:  64 year old female with trauma. EXAM: CT HEAD WITHOUT CONTRAST CT CERVICAL SPINE WITHOUT CONTRAST TECHNIQUE: Multidetector CT imaging of the head and cervical spine was performed following the standard protocol without intravenous contrast. Multiplanar CT image reconstructions of the cervical spine were also generated. COMPARISON:  None. FINDINGS: CT HEAD FINDINGS Brain: Mild age-related atrophy and chronic microvascular ischemic changes. There is no acute intracranial hemorrhage. No mass effect or midline shift. No extra-axial fluid collection. Vascular: No hyperdense vessel or unexpected calcification. Skull: Normal. Negative for fracture or focal lesion. Sinuses/Orbits: No acute finding. Other: None CT CERVICAL SPINE FINDINGS Alignment: No acute subluxation. Skull base and vertebrae: No acute fracture. Soft tissues and spinal canal: No prevertebral fluid or swelling. No visible canal hematoma. Disc levels:  Degenerative changes. Upper chest: There is pulmonary vascular prominence with cephalization, likely vascular congestion. Clinical correlation is recommended. Other: Bilateral carotid bulb calcified plaques. IMPRESSION: 1. No acute intracranial pathology. Mild age-related atrophy and chronic microvascular ischemic changes. 2. No acute/traumatic cervical spine pathology. Electronically Signed   By: Anner Crete M.D.   On: 02/05/2020 18:39   CT Angio Chest PE W and/or Wo Contrast  Result Date:  02/27/2020 CLINICAL DATA:  Syncope EXAM: CT ANGIOGRAPHY CHEST WITH CONTRAST TECHNIQUE: Multidetector CT imaging of the chest was performed using the standard protocol during bolus administration of intravenous contrast.  Multiplanar CT image reconstructions and MIPs were obtained to evaluate the vascular anatomy. CONTRAST:  81mL OMNIPAQUE IOHEXOL 350 MG/ML SOLN COMPARISON:  None. FINDINGS: Cardiovascular: There is adequate opacification of the pulmonary arterial tree. There is no intraluminal filling defect identified to suggest acute pulmonary embolism. The central pulmonary arteries are enlarged in keeping with pulmonary arterial hypertension. There is extensive coronary artery calcification. There is extensive calcification of the mitral valve annulus. Mild calcification of the aortic valve leaflets. There is mild global cardiomegaly. No pericardial effusion. Atherosclerotic calcification is seen within the thoracic aorta. No aortic aneurysm. Mediastinum/Nodes: No pathologic thoracic adenopathy. Esophagus is unremarkable. Lungs/Pleura: Trace right and small left pleural effusions are present. Mild bibasilar atelectasis. No superimposed confluent pulmonary infiltrate. No pneumothorax. Central airways are widely patent. Upper Abdomen: Unremarkable Musculoskeletal: No acute bone abnormality. Review of the MIP images confirms the above findings. IMPRESSION: No pulmonary embolism. Morphologic changes of pulmonary arterial hypertension. Mild global cardiomegaly.  Extensive coronary artery calcification. Extensive calcification of the mitral valve annulus noted. Echocardiography may be helpful to assess the degree of valvular dysfunction. Trace right and small left pleural effusions, slightly increased since prior examination. Aortic Atherosclerosis (ICD10-I70.0). Electronically Signed   By: Fidela Salisbury MD   On: 02/27/2020 06:07   CT Angio Chest PE W and/or Wo Contrast  Result Date: 02/05/2020 CLINICAL DATA:   Positive D-dimer, hypoxia, syncope, history of prior PE EXAM: CT ANGIOGRAPHY CHEST WITH CONTRAST TECHNIQUE: Multidetector CT imaging of the chest was performed using the standard protocol during bolus administration of intravenous contrast. Multiplanar CT image reconstructions and MIPs were obtained to evaluate the vascular anatomy. CONTRAST:  30mL OMNIPAQUE IOHEXOL 350 MG/ML SOLN COMPARISON:  Radiograph 02/05/2020 FINDINGS: Cardiovascular: While there is satisfactory central pulmonary arterial opacification. Contrast opacification of pulmonary arteries is significantly diminished beyond the central and lobar segments. However, there are several areas which are concerning for pulmonary artery emboli. The first being some thin linear almost web-like filling defects present in the right intralobar pulmonary artery which could suggest some chronic embolic disease albeit with more expanded and unopacified segmental branches in the posteromedial basal segments of the right lower lobe as well as several segmental branches in the left lower lobe as well. Central pulmonary arteries are top-normal caliber. RV/LV ratio is maintained. Cardiac size is top normal. Three-vessel coronary artery calcifications are present. Dense calcification is present in the mitral annulus. Additional calcifications are present upon the aortic leaflets. Atherosclerotic plaque within the normal caliber aorta. Suboptimal opacification for luminal evaluation of the aorta. No gross aortic abnormality is seen. Normal 3 vessel branching of the aortic arch. Proximal great vessels are calcified and slightly tortuous but otherwise unremarkable. No major venous abnormalities are seen. Mediastinum/Nodes: No mediastinal fluid or gas. Normal thyroid gland and thoracic inlet. No acute abnormality of the trachea. Small hiatal hernia without other acute esophageal abnormality. No worrisome mediastinal, hilar or axillary adenopathy. Lungs/Pleura: Lung volumes are  low with dependent atelectatic change and additional bandlike areas of opacity in both lower lobes and the right middle lobe likely reflecting further subsegmental atelectasis or scarring. No pneumothorax or pleural effusion. Some mild mosaic attenuation may reflect air trapping or atelectatic change accentuated by imaging during exhalation. 7 mm juxta phrenic nodule seen in the right lower lobe (6/84). No other concerning pulmonary nodules or masses. Upper Abdomen: No acute abnormalities present in the visualized portions of the upper abdomen. Musculoskeletal: Multilevel degenerative changes are present in the imaged portions of the spine. Additional degenerative  changes in the shoulders. No worrisome chest wall lesions. Review of the MIP images confirms the above findings. IMPRESSION: 1. Excellent opacification of the central lobar pulmonary arteries however there is mixing artifact within the more distal segmental and subsegmental branches. Web-like filling defects are seen in the right intralobar pulmonary artery which could suggest some chronic pulmonary embolus in this patient history of prior embolic disease. Suspect additional filling defects within segmental and subsegmental branches of both lower lobes though these may be accentuated by mixing artifact. No convincing CT evidence of right heart strain at this time. 2. Cardiomegaly. Extensive calcifications upon the mitral annulus and to Eshaan Titzer lesser extent the aortic leaflets may warrant further evaluation with echocardiography to assess for underlying valvular dysfunction. Three-vessel coronary artery calcifications. 3. Low lung volumes and basilar atelectatic changes with some more mosaic attenuation likely reflecting further atelectasis or air trapping. 4. 7 mm juxta phrenic nodule in the right lower lobe. Non-contrast chest CT at 6-12 months is recommended. If the nodule is stable at time of repeat CT, then future CT at 18-24 months (from today's scan) is  considered optional for low-risk patients, but is recommended for high-risk patients. This recommendation follows the consensus statement: Guidelines for Management of Incidental Pulmonary Nodules Detected on CT Images: From the Fleischner Society 2017; Radiology 2017; 284:228-243. 5. Aortic Atherosclerosis (ICD10-I70.0). Critical Value/emergent results were called by telephone at the time of interpretation on 02/05/2020 at 9:12 pm to provider [facility] , who verbally acknowledged these results. Electronically Signed   By: Lovena Le M.D.   On: 02/05/2020 21:13   CT Cervical Spine Wo Contrast  Result Date: 02/05/2020 CLINICAL DATA:  64 year old female with trauma. EXAM: CT HEAD WITHOUT CONTRAST CT CERVICAL SPINE WITHOUT CONTRAST TECHNIQUE: Multidetector CT imaging of the head and cervical spine was performed following the standard protocol without intravenous contrast. Multiplanar CT image reconstructions of the cervical spine were also generated. COMPARISON:  None. FINDINGS: CT HEAD FINDINGS Brain: Mild age-related atrophy and chronic microvascular ischemic changes. There is no acute intracranial hemorrhage. No mass effect or midline shift. No extra-axial fluid collection. Vascular: No hyperdense vessel or unexpected calcification. Skull: Normal. Negative for fracture or focal lesion. Sinuses/Orbits: No acute finding. Other: None CT CERVICAL SPINE FINDINGS Alignment: No acute subluxation. Skull base and vertebrae: No acute fracture. Soft tissues and spinal canal: No prevertebral fluid or swelling. No visible canal hematoma. Disc levels:  Degenerative changes. Upper chest: There is pulmonary vascular prominence with cephalization, likely vascular congestion. Clinical correlation is recommended. Other: Bilateral carotid bulb calcified plaques. IMPRESSION: 1. No acute intracranial pathology. Mild age-related atrophy and chronic microvascular ischemic changes. 2. No acute/traumatic cervical spine pathology.  Electronically Signed   By: Anner Crete M.D.   On: 02/05/2020 18:39   DG Chest Portable 1 View  Result Date: 02/27/2020 CLINICAL DATA:  Syncope EXAM: PORTABLE CHEST 1 VIEW COMPARISON:  02/05/2020 FINDINGS: Lung volumes are small. Since the prior examination, Arianne Klinge tiny left pleural effusion has developed. There is bibasilar atelectasis. No pneumothorax. Cardiac size within normal limits. Pulmonary vascularity is normal. No acute bone abnormality. IMPRESSION: Interval development of bibasilar atelectasis and tiny left pleural effusion. Electronically Signed   By: Fidela Salisbury MD   On: 02/27/2020 00:48   DG Chest Portable 1 View  Result Date: 02/05/2020 CLINICAL DATA:  64 year old female with shortness of breath. EXAM: PORTABLE CHEST 1 VIEW COMPARISON:  None. FINDINGS: Minimal bibasilar atelectasis. No focal consolidation, pleural effusion, pneumothorax. The cardiac silhouette is within  limits. Atherosclerotic calcification of the aorta. No acute osseous pathology. IMPRESSION: No active disease. Electronically Signed   By: Anner Crete M.D.   On: 02/05/2020 20:21   ECHOCARDIOGRAM COMPLETE  Result Date: 02/06/2020    ECHOCARDIOGRAM REPORT   Patient Name:   VERINA GALENO Date of Exam: 02/06/2020 Medical Rec #:  [id number]      Height:       66.0 in Accession #:    [id number]     Weight:       228.0 lb Date of Birth:  March 20, 1956       BSA:          2.114 m Patient Age:    89 years       BP:           106/70 mmHg Patient Gender: F              HR:           120 bpm. Exam Location:  Inpatient Procedure: 2D Echo, 3D Echo, Cardiac Doppler and Color Doppler Indications:    I26.02 Pulmonary embolus  History:        Patient has prior history of Echocardiogram examinations, most                 recent 05/02/2018. Abnormal ECG, Aortic Valve Disease;                 Arrythmias:Atrial Fibrillation.  Sonographer:    Roseanna Rainbow RDCS Referring Phys: [place]  Sonographer Comments: Technically  difficult study due to poor echo windows. Image acquisition challenging due to patient body habitus. IMPRESSIONS  1. Left ventricular ejection fraction, by estimation, is 55 to 60%. The left ventricle has normal function. The left ventricle has no regional wall motion abnormalities. There is mild concentric left ventricular hypertrophy. Left ventricular diastolic function could not be evaluated.  2. Moderate calcific mitral stenosis is present. MG 5.6 mmHG @ 109 bpm. MVA by VTI 1.86 cm2. Mild to moderate MR is present which is related to restricted movement of the PMVL (IIIB). The mitral valve is degenerative. Mild to moderate mitral valve regurgitation. Moderate mitral stenosis. Severe mitral annular calcification.  3. Mild to moderate AS is present. V max 2.25 m/s, MG 12.3 mmHG, AVA 1.29 cm2, DI 0.34. Gradients lower than expected due to low SVI (SV=56 cc, SVI=26 cc/m2). The aortic valve is tricuspid. There is moderate calcification of the aortic valve. There is moderate thickening of the aortic valve. Aortic valve regurgitation is not visualized. Mild to moderate aortic valve stenosis. Aortic valve area, by VTI measures 1.29 cm. Aortic valve mean gradient measures 12.3 mmHg. Aortic valve Vmax measures 2.25 m/s.  4. Right ventricular systolic function is normal. The right ventricular size is mildly enlarged. There is mildly elevated pulmonary artery systolic pressure. The estimated right ventricular systolic pressure is 37.6 mmHg.  5. Left atrial size was severely dilated.  6. The inferior vena cava is dilated in size with <50% respiratory variability, suggesting right atrial pressure of 15 mmHg. Comparison(s): Changes from prior study are noted. Afib with RVR is now present. Valvular heart disease is stable. FINDINGS  Left Ventricle: Left ventricular ejection fraction, by estimation, is 55 to 60%. The left ventricle has normal function. The left ventricle has no regional wall motion abnormalities. The left  ventricular internal cavity size was normal in size. There is  mild concentric left ventricular hypertrophy. Left ventricular diastolic function could not be evaluated due  to atrial fibrillation. Left ventricular diastolic function could not be evaluated. Right Ventricle: The right ventricular size is mildly enlarged. No increase in right ventricular wall thickness. Right ventricular systolic function is normal. There is mildly elevated pulmonary artery systolic pressure. The tricuspid regurgitant velocity is 2.28 m/s, and with an assumed right atrial pressure of 15 mmHg, the estimated right ventricular systolic pressure is 01.7 mmHg. Left Atrium: Left atrial size was severely dilated. Right Atrium: Right atrial size was normal in size. Pericardium: Trivial pericardial effusion is present. Presence of pericardial fat pad. Mitral Valve: Moderate calcific mitral stenosis is present. MG 5.6 mmHG @ 109 bpm. MVA by VTI 1.86 cm2. Mild to moderate MR is present which is related to restricted movement of the PMVL (IIIB). The mitral valve is degenerative in appearance. Severe mitral annular calcification. Mild to moderate mitral valve regurgitation. Moderate mitral valve stenosis. MV peak gradient, 14.8 mmHg. The mean mitral valve gradient is 5.6 mmHg with average heart rate of 109 bpm. Tricuspid Valve: The tricuspid valve is grossly normal. Tricuspid valve regurgitation is trivial. No evidence of tricuspid stenosis. Aortic Valve: Mild to moderate AS is present. V max 2.25 m/s, MG 12.3 mmHG, AVA 1.29 cm2, DI 0.34. Gradients lower than expected due to low SVI (SV=56 cc, SVI=26 cc/m2). The aortic valve is tricuspid. There is moderate calcification of the aortic valve. There is moderate thickening of the aortic valve. Aortic valve regurgitation is not visualized. Mild to moderate aortic stenosis is present. Aortic valve mean gradient measures 12.3 mmHg. Aortic valve peak gradient measures 20.3 mmHg. Aortic valve area, by VTI  measures 1.29 cm. Pulmonic Valve: The pulmonic valve was grossly normal. Pulmonic valve regurgitation is not visualized. No evidence of pulmonic stenosis. Aorta: The aortic root and ascending aorta are structurally normal, with no evidence of dilitation. Venous: The inferior vena cava is dilated in size with less than 50% respiratory variability, suggesting right atrial pressure of 15 mmHg. IAS/Shunts: The atrial septum is grossly normal.  LEFT VENTRICLE PLAX 2D LVIDd:         4.80 cm LVIDs:         3.90 cm LV PW:         1.70 cm LV IVS:        1.40 cm LVOT diam:     2.20 cm LV SV:         56 LV SV Index:   26 LVOT Area:     3.80 cm  LV Volumes (MOD) LV vol d, MOD A2C: 96.5 ml LV vol d, MOD A4C: 68.9 ml LV vol s, MOD A2C: 58.2 ml LV vol s, MOD A4C: 33.1 ml LV SV MOD A2C:     38.3 ml LV SV MOD A4C:     68.9 ml LV SV MOD BP:      41.1 ml RIGHT VENTRICLE            IVC RV S prime:     6.96 cm/s  IVC diam: 2.50 cm TAPSE (M-mode): 2.0 cm LEFT ATRIUM              Index        RIGHT ATRIUM           Index LA diam:        4.50 cm  2.13 cm/m   RA Area:     19.00 cm LA Vol (A2C):   220.0 ml 104.05 ml/m RA Volume:   61.20 ml  28.95 ml/m LA Vol (A4C):  127.0 ml 60.07 ml/m LA Biplane Vol: 176.0 ml 83.24 ml/m  AORTIC VALVE AV Area (Vmax):    1.58 cm AV Area (Vmean):   1.27 cm AV Area (VTI):     1.29 cm AV Vmax:           225.33 cm/s AV Vmean:          166.667 cm/s AV VTI:            0.435 m AV Peak Grad:      20.3 mmHg AV Mean Grad:      12.3 mmHg LVOT Vmax:         93.80 cm/s LVOT Vmean:        55.900 cm/s LVOT VTI:          0.147 m LVOT/AV VTI ratio: 0.34  AORTA Ao Root diam: 3.50 cm Ao Asc diam:  3.80 cm MITRAL VALVE                TRICUSPID VALVE MV Area (PHT): 3.29 cm     TR Peak grad:   20.8 mmHg MV Area VTI:   1.86 cm     TR Vmax:        228.00 cm/s MV Peak grad:  14.8 mmHg MV Mean grad:  5.6 mmHg     SHUNTS MV Vmax:       1.92 m/s     Systemic VTI:  0.15 m MV Vmean:      98.8 cm/s    Systemic Diam: 2.20 cm  MV VTI:        0.30 m MV Decel Time: 231 msec MR Peak grad: 108.2 mmHg MR Mean grad: 71.0 mmHg MR Vmax:      520.00 cm/s MR Vmean:     405.0 cm/s MV E velocity: 165.00 cm/s Eleonore Chiquito MD Electronically signed by Eleonore Chiquito MD Signature Date/Time: 02/06/2020/11:41:22 AM    Final    VAS [place] LOWER EXTREMITY VENOUS (DVT)  Result Date: 02/06/2020  Lower Venous DVTStudy Indications: Pulmonary embolism.  Anticoagulation: Heparin. Limitations: Poor ultrasound/tissue interface. Comparison Study: No prior studies. Performing Technologist: Darlin Coco  Examination Guidelines: Jaice Lague complete evaluation includes B-mode imaging, spectral Doppler, color Doppler, and power Doppler as needed of all accessible portions of each vessel. Bilateral testing is considered an integral part of Caci Orren complete examination. Limited examinations for reoccurring indications may be performed as noted. The reflux portion of the exam is performed with the patient in reverse Trendelenburg.  +---------+---------------+---------+-----------+----------+--------------+ RIGHT    CompressibilityPhasicitySpontaneityPropertiesThrombus Aging +---------+---------------+---------+-----------+----------+--------------+ CFV      Full           Yes      Yes                                 +---------+---------------+---------+-----------+----------+--------------+ SFJ      Full                                                        +---------+---------------+---------+-----------+----------+--------------+ FV Prox  Full                                                        +---------+---------------+---------+-----------+----------+--------------+  FV Mid   Full                                                        +---------+---------------+---------+-----------+----------+--------------+ FV DistalFull                                                         +---------+---------------+---------+-----------+----------+--------------+ PFV      Full                                                        +---------+---------------+---------+-----------+----------+--------------+ POP      Full           Yes      Yes                                 +---------+---------------+---------+-----------+----------+--------------+ PTV      Full                                                        +---------+---------------+---------+-----------+----------+--------------+ PERO     Full                                                        +---------+---------------+---------+-----------+----------+--------------+   +---------+---------------+---------+-----------+----------+--------------+ LEFT     CompressibilityPhasicitySpontaneityPropertiesThrombus Aging +---------+---------------+---------+-----------+----------+--------------+ CFV      Full           Yes      Yes                                 +---------+---------------+---------+-----------+----------+--------------+ SFJ      Full                                                        +---------+---------------+---------+-----------+----------+--------------+ FV Prox  Full                                                        +---------+---------------+---------+-----------+----------+--------------+ FV Mid   Full                                                        +---------+---------------+---------+-----------+----------+--------------+  FV DistalFull                                                        +---------+---------------+---------+-----------+----------+--------------+ PFV      Full                                                        +---------+---------------+---------+-----------+----------+--------------+ POP      Full           Yes      Yes                                  +---------+---------------+---------+-----------+----------+--------------+ PTV      Full                                                        +---------+---------------+---------+-----------+----------+--------------+ PERO                                                  Not visualized +---------+---------------+---------+-----------+----------+--------------+     Summary: RIGHT: - There is no evidence of deep vein thrombosis in the lower extremity.  - No cystic structure found in the popliteal fossa.  LEFT: - There is no evidence of deep vein thrombosis in the lower extremity. However, portions of this examination were limited- see technologist comments above.  - Brier Reid cystic structure is found in the popliteal fossa.  *See table(s) above for measurements and observations. Electronically signed by Deitra Mayo MD on 02/06/2020 at 1:03:21 PM.    Final     Microbiology: Recent Results (from the past 240 hour(s))  Resp Panel by RT-PCR (Flu Danaija Eskridge&B, Covid) Nasopharyngeal Swab     Status: None   Collection Time: 02/27/20  1:08 AM   Specimen: Nasopharyngeal Swab; Nasopharyngeal(NP) swabs in vial transport medium  Result Value Ref Range Status   SARS Coronavirus 2 by RT PCR NEGATIVE NEGATIVE Final    Comment: (NOTE) SARS-CoV-2 target nucleic acids are NOT DETECTED.  The SARS-CoV-2 RNA is generally detectable in upper respiratory specimens during the acute phase of infection. The lowest concentration of SARS-CoV-2 viral copies this assay can detect is 138 copies/mL. Derrin Currey negative result does not preclude SARS-Cov-2 infection and should not be used as the sole basis for treatment or other patient management decisions. Devynn Scheff negative result may occur with  improper specimen collection/handling, submission of specimen other than nasopharyngeal swab, presence of viral mutation(s) within the areas targeted by this assay, and inadequate number of viral copies(<138 copies/mL). Careena Degraffenreid negative result must  be combined with clinical observations, patient history, and epidemiological information. The expected result is Negative.  Fact Sheet for Patients:  EntrepreneurPulse.com.au  Fact Sheet for Healthcare Providers:  IncredibleEmployment.be  This test is no t yet approved or cleared by the [place] FDA  and  has been authorized for detection and/or diagnosis of SARS-CoV-2 by FDA under an Emergency Use Authorization (EUA). This EUA will remain  in effect (meaning this test can be used) for the duration of the COVID-19 declaration under Section 564(b)(1) of the Act, 21 U.S.C.section 360bbb-3(b)(1), unless the authorization is terminated  or revoked sooner.       Influenza [place] by PCR NEGATIVE NEGATIVE Final   Influenza B by PCR NEGATIVE NEGATIVE Final    Comment: (NOTE) The Xpert Xpress SARS-CoV-2/FLU/RSV plus assay is intended as an aid in the diagnosis of influenza from Nasopharyngeal swab specimens and should not be used as Seleena Reimers sole basis for treatment. Nasal washings and aspirates are unacceptable for Xpert Xpress SARS-CoV-2/FLU/RSV testing.  Fact Sheet for Patients: EntrepreneurPulse.com.au  Fact Sheet for Healthcare Providers: IncredibleEmployment.be  This test is not yet approved or cleared by the [place] FDA and has been authorized for detection and/or diagnosis of SARS-CoV-2 by FDA under an Emergency Use Authorization (EUA). This EUA will remain in effect (meaning this test can be used) for the duration of the COVID-19 declaration under Section 564(b)(1) of the Act, 21 U.S.C. section 360bbb-3(b)(1), unless the authorization is terminated or revoked.  Performed at [facility], [place] [address], [place], [place] [address]      Labs: Basic Metabolic Panel: Recent Labs  Lab 02/27/20 0108 02/27/20 0220 02/28/20 0539  NA 136  --  139  K 4.3  --  4.7  CL 99  --  100  CO2  27  --  28  GLUCOSE 144*  --  98  BUN 26*  --  40*  CREATININE 1.38*  --  1.64*  CALCIUM 9.1  --  9.1  MG  --  2.3  --    Liver Function Tests: Recent Labs  Lab 02/28/20 0539  AST 17  ALT 11  ALKPHOS 64  BILITOT 1.2  PROT 6.3*  ALBUMIN 3.6   No results for input(s): LIPASE, AMYLASE in the last 168 hours. No results for input(s): AMMONIA in the last 168 hours. CBC: Recent Labs  Lab 02/27/20 0108 02/28/20 0539  WBC 8.6 6.9  HGB 13.1 12.6  HCT 39.9 38.7  MCV 89.5 91.1  PLT 159 174   Cardiac Enzymes: No results for input(s): CKTOTAL, CKMB, CKMBINDEX, TROPONINI in the last 168 hours. BNP: BNP (last 3 results) Recent Labs    02/05/20 1811  BNP 341.7*    ProBNP (last 3 results) No results for input(s): PROBNP in the last 8760 hours.  CBG: Recent Labs  Lab 02/27/20 0010  GLUCAP 154*       Signed:  Fayrene Helper MD.  [facility] 02/28/2020, 11:09 AM

## 2020-02-28 NOTE — Progress Notes (Signed)
Patient has been sinus rhythm during this shift no signs of distress noted will continue present plan of care.

## 2020-02-28 NOTE — Progress Notes (Signed)
Progress Note  Patient Name: Cynthia Cook Date of Encounter: 02/28/2020    Subjective   Maintaining sinus rhythm in 80s.  BP stable.  Reports symptoms have resolved, denies any lightheadedness or dyspnea.  Inpatient Medications    Scheduled Meds: . apixaban  5 mg Oral BID  . ascorbic acid  500 mg Oral Daily  . cholecalciferol  2,000 Units Oral Daily  . levothyroxine  50 mcg Oral Daily  . LORazepam  0.5 mg Intravenous Once  . multivitamin with minerals  1 tablet Oral Daily  . sodium chloride flush  3 mL Intravenous Q12H  . vitamin A  10,000 Units Oral Daily  . zinc sulfate  220 mg Oral Daily   Continuous Infusions:  PRN Meds: acetaminophen **OR** acetaminophen, ondansetron **OR** ondansetron (ZOFRAN) IV, traMADol   Vital Signs    Vitals:   02/27/20 1434 02/27/20 1813 02/27/20 2100 02/28/20 0518  BP: 117/82 124/75 116/66 123/74  Pulse: 81 82 84 84  Resp: 20 (!) _0 Temp: 98.1 F (36.7 C) 98.1 F (36.7 C) 98.8 F (37.1 C) 98.4 F (36.9 C)  TempSrc: Oral   Oral  SpO2: 98% 99% 97% 91%  Weight:      Height:        Intake/Output Summary (Last 24 hours) at 02/28/2020 0922 Last data filed at 02/27/2020 2000 Gross per 24 hour  Intake 0 ml  Output 1 ml  Net -1 ml   Last 3 Weights 02/26/2020 02/18/2020 02/17/2020  Weight (lbs) 230 lb 227 lb 15.3 oz 228 lb  Weight (kg) 104.327 kg 103.4 kg 103.42 kg      Telemetry    NSR rate 70-80s- Personally Reviewed  ECG    No new ECG - Personally Reviewed  Physical Exam   GEN: No acute distress.   Neck: No JVD Cardiac: RRR, no murmurs, rubs, or gallops.  Respiratory: Clear to auscultation bilaterally. GI: Soft, nontender, non-distended  MS: No edema; No deformity. Neuro:  Nonfocal  Psych: Normal affect   Labs    High Sensitivity Troponin:   Recent Labs  Lab 02/05/20 2006 02/18/20 1941 02/18/20 2124 02/27/20 0108 02/27/20 0220  TROPONINIHS 24* 14 19* 21* 23*      Chemistry Recent Labs  Lab  02/27/20 0108 02/28/20 0539  NA 136 139  K 4.3 4.7  CL 99 100  CO2 27 28  GLUCOSE 144* 98  BUN 26* 40*  CREATININE 1.38* 1.64*  CALCIUM 9.1 9.1  PROT  --  6.3*  ALBUMIN  --  3.6  AST  --  17  ALT  --  11  ALKPHOS  --  64  BILITOT  --  1.2  GFRNONAA 42* 34*  ANIONGAP 10 11     Hematology Recent Labs  Lab 02/27/20 0108 02/28/20 0539  WBC 8.6 6.9  RBC 4.46 4.25  HGB 13.1 12.6  HCT 39.9 38.7  MCV 89.5 91.1  MCH 29.4 29.6  MCHC 32.8 32.6  RDW 13.7 14.0  PLT 159 174    BNPNo results for input(s): BNP, PROBNP in the last 168 hours.   DDimer  Recent Labs  Lab 02/27/20 0142  DDIMER 0.82*     Radiology    CT Angio Chest PE W and/or Wo Contrast  Result Date: 02/27/2020 CLINICAL DATA:  Syncope EXAM: CT ANGIOGRAPHY CHEST WITH CONTRAST TECHNIQUE: Multidetector CT imaging of the chest was performed using the standard protocol during bolus administration of intravenous contrast. Multiplanar CT image reconstructions and  MIPs were obtained to evaluate the vascular anatomy. CONTRAST:  42m OMNIPAQUE IOHEXOL 350 MG/ML SOLN COMPARISON:  None. FINDINGS: Cardiovascular: There is adequate opacification of the pulmonary arterial tree. There is no intraluminal filling defect identified to suggest acute pulmonary embolism. The central pulmonary arteries are enlarged in keeping with pulmonary arterial hypertension. There is extensive coronary artery calcification. There is extensive calcification of the mitral valve annulus. Mild calcification of the aortic valve leaflets. There is mild global cardiomegaly. No pericardial effusion. Atherosclerotic calcification is seen within the thoracic aorta. No aortic aneurysm. Mediastinum/Nodes: No pathologic thoracic adenopathy. Esophagus is unremarkable. Lungs/Pleura: Trace right and small left pleural effusions are present. Mild bibasilar atelectasis. No superimposed confluent pulmonary infiltrate. No pneumothorax. Central airways are widely patent.  Upper Abdomen: Unremarkable Musculoskeletal: No acute bone abnormality. Review of the MIP images confirms the above findings. IMPRESSION: No pulmonary embolism. Morphologic changes of pulmonary arterial hypertension. Mild global cardiomegaly.  Extensive coronary artery calcification. Extensive calcification of the mitral valve annulus noted. Echocardiography may be helpful to assess the degree of valvular dysfunction. Trace right and small left pleural effusions, slightly increased since prior examination. Aortic Atherosclerosis (ICD10-I70.0). Electronically Signed   By: AFidela SalisburyMD   On: 02/27/2020 06:07   DG Chest Portable 1 View  Result Date: 02/27/2020 CLINICAL DATA:  Syncope EXAM: PORTABLE CHEST 1 VIEW COMPARISON:  02/05/2020 FINDINGS: Lung volumes are small. Since the prior examination, a tiny left pleural effusion has developed. There is bibasilar atelectasis. No pneumothorax. Cardiac size within normal limits. Pulmonary vascularity is normal. No acute bone abnormality. IMPRESSION: Interval development of bibasilar atelectasis and tiny left pleural effusion. Electronically Signed   By: AFidela SalisburyMD   On: 02/27/2020 00:48    Cardiac Studies    Patient Profile     64y.o. female with PE, newly diagnosed atrial fibrillation, mild to moderate AI's/MR, hypothyroidism presents with recurrent syncope.  Assessment & Plan   Persistent atrial fibrillation: Recent diagnosis, was scheduled for DCCV as outpatient.  Has been on Eliquis, denies any missed doses.  Has had difficulty tolerating metoprolol due to lightheadedness, low BP.  Echo 02/06/2020 showed EF 55 to 60%, moderate mitral stenosis (MG 6 mmHg, MVA 1.9 cm^2 by continuity) 2/2 MAC, mild to moderate MR, mild-moderate AS -Underwent successful DCCV in the ED yesterday.  Maintaining sinus rhythm -Continue Eliquis 5 mg twice daily -Hold metoprolol given issues with orthostatic hypotension. -Given difficulty with rate control due to  inability to tolerate beta-blockers, would favor rhythm control strategy.  Plan for AAD vs ablation if recurrent Afib.  Will schedule with AF clinic  Syncope: may be related to orthostatic hypotension, holding metoprolol.  CAD: by CT of chest , has been ordered for cardiac CTA as outpatient.  Refuses statins.  Remote PE  Neg CTPA this admit.  Continue Eliquis as above  OK for discharge from cardiology perspective.  Would continue Eliquis on discharge, hold metoprolol.  Will schedule follow-up in A. Fib clinic   For questions or updates, please contact CImogenePlease consult [email or web address] for contact info under        Signed, CDonato Heinz MD  02/28/2020, 9:22 AM

## 2020-02-29 ENCOUNTER — Encounter: Payer: Self-pay | Admitting: Family Medicine

## 2020-02-29 DIAGNOSIS — J90 Pleural effusion, not elsewhere classified: Secondary | ICD-10-CM

## 2020-03-01 NOTE — Telephone Encounter (Signed)
Cardioversion scheduled for 11/23 cancelled.

## 2020-03-01 NOTE — Telephone Encounter (Signed)
Can you call endoscopy and cancel her cardioversion for tomorrow? She was cardioverted in the ER. Thanks.

## 2020-03-02 ENCOUNTER — Ambulatory Visit (HOSPITAL_COMMUNITY): Admission: RE | Admit: 2020-03-02 | Payer: Medicare Other | Source: Home / Self Care | Admitting: Cardiology

## 2020-03-02 ENCOUNTER — Encounter (HOSPITAL_COMMUNITY): Admission: RE | Payer: Medicare Other | Source: Home / Self Care

## 2020-03-02 SURGERY — CARDIOVERSION
Anesthesia: General

## 2020-03-07 ENCOUNTER — Encounter: Payer: Self-pay | Admitting: Family Medicine

## 2020-03-09 ENCOUNTER — Encounter: Payer: Self-pay | Admitting: Family Medicine

## 2020-03-10 ENCOUNTER — Encounter: Payer: Self-pay | Admitting: Family Medicine

## 2020-03-11 DIAGNOSIS — M62830 Muscle spasm of back: Secondary | ICD-10-CM | POA: Diagnosis not present

## 2020-03-11 DIAGNOSIS — M9901 Segmental and somatic dysfunction of cervical region: Secondary | ICD-10-CM | POA: Diagnosis not present

## 2020-03-11 DIAGNOSIS — S338XXA Sprain of other parts of lumbar spine and pelvis, initial encounter: Secondary | ICD-10-CM | POA: Diagnosis not present

## 2020-03-11 DIAGNOSIS — M9902 Segmental and somatic dysfunction of thoracic region: Secondary | ICD-10-CM | POA: Diagnosis not present

## 2020-03-15 ENCOUNTER — Telehealth: Payer: Self-pay | Admitting: Licensed Clinical Social Worker

## 2020-03-15 ENCOUNTER — Telehealth: Payer: Self-pay | Admitting: Family Medicine

## 2020-03-15 NOTE — Telephone Encounter (Signed)
   Eyvonne Burchfield DOB: 05/16/1955 MRN: [id number]   RIDER WAIVER AND RELEASE OF LIABILITY  For purposes of improving physical access to our facilities, [facility] is pleased to partner with third parties to provide [place] patients or other authorized individuals the option of convenient, on-demand ground transportation services (the [place]") through use of the technology service that enables users to request on-demand ground transportation from independent third-party providers.  By opting to use and accept these [employer], I, the undersigned, hereby agree on behalf of myself, and on behalf of any minor child using the [employer] for whom I am the parent or legal guardian, as follows:  1. Government social research officer provided to me are provided by independent third-party transportation providers who are not [employer] or employees and who are unaffiliated with [employer]. 2. [place] is neither a transportation carrier nor a common or public carrier. 3. [place] has no control over the quality or safety of the transportation that occurs as a result of the [employer]. 4. [place] cannot guarantee that any third-party transportation provider will complete any arranged transportation service. 5. [place] makes no representation, warranty, or guarantee regarding the reliability, timeliness, quality, safety, suitability, or availability of any of the Transport Services or that they will be error free. 6. I fully understand that traveling by vehicle involves risks and dangers of serious bodily injury, including permanent disability, paralysis, and death. I agree, on behalf of myself and on behalf of any minor child using the Transport Services for whom I am the parent or legal guardian, that the entire risk arising out of my use of the [employer] remains solely with me, to the maximum extent permitted under applicable law. 7. The [employer] are provided "as is" and "as available." [facility] disclaims all representations and warranties, express, implied or statutory, not expressly set out in these terms, including the implied warranties of merchantability and fitness for a particular purpose. 8. I hereby waive and release [place], its agents, employees, officers, directors, representatives, insurers, attorneys, assigns, successors, subsidiaries, and affiliates from any and all past, present, or future claims, demands, liabilities, actions, causes of action, or suits of any kind directly or indirectly arising from acceptance and use of the [employer]. 9. I further waive and release [place] and its affiliates from all present and future liability and responsibility for any injury or death to persons or damages to property caused by or related to the use of the [employer]. 10. I have read this Waiver and Release of Liability, and I understand the terms used in it and their legal significance. This Waiver is freely and voluntarily given with the understanding that my right (as well as the right of any minor child for whom I am the parent or legal guardian using the [employer]) to legal recourse against [place] in connection with the [employer] is knowingly surrendered in return for use of these services.   I attest that I read the consent document to [name], gave Ms. Bassinger the opportunity to ask questions and answered the questions asked (if any). I affirm that [name] then provided consent for she's participation in this program.     Katy Apo

## 2020-03-15 NOTE — Progress Notes (Signed)
Heart and Vascular Care Navigation  03/15/2020  Cynthia Cook Apr 19, 1955 [id number]  Reason for Referral:  Ride to appointment                                                                                                   Assessment:    CSW received referral as pt was cancelling her appointment because she didn't feel comfortable driving herself.  CSW called and spoke w/ pt at [phone]. Pt confirmed phone number and address. CSW explained that [facility] have the ability to get pt to appointments as needed w/ [facility] providers. Pt gives permission for this writer to make a referral and f/u with her to ensure ride scheduled for appt tomorrow at [employer].   Pt shares she has had a stressful several months that have included several ED visits and syncopal episodes. She expresses displeasure with how discharge planning was handled from the hospital and shares that she was never seen by a Electrical engineer [facility] team) to ensure she had what she needed to feel comfortable going home. Pt utilizes a cane as needed but at that time was having unsteadiness and had to rely on her friends to come by and assist her. On top of that she had several ambulance rides which she felt were unnecessary (citing she was discharged from the hospital on time around 3am when a friend could have picked her up a few hours later). Pt has spoken w/ patient experience but feels her concerns were just met w/ an apology. This Probation officer shared my knowledge around discharge planning triggers for [facility] team inpatient that may have led to her not seeing a TOC team member, I also offered my apologies that pt felt the process left her in a bad place safety wise and financially concerned.   Pt currently doing better, when she spoke w/ me she was at the park w/ her dog. She is appreciative of referral to transportation services. CSW remains available and pt provided w/ my name and number should pt have  additional concerns that arise.                                     HRT/VAS Care Coordination    Patients Home Cardiology Office [place] arrangements for the past 2 months Apartment   Lives with: Self; Pets   Patient Current Insurance Coverage Managed Medicare   Patient Has Concern With Paying Medical Bills Yes   Patient Concerns With Medical Bills previous hospitalization/ambulance bills   Does Patient Have Prescription Coverage? Yes   Home Assistive Devices/Equipment Cane (specify quad or straight)      Social History:  SDOH Screenings   Alcohol Screen:   . Last Alcohol Screening Score (AUDIT): Not on file  Depression (PHQ2-9):   . PHQ-2 Score: Not on file  Financial Resource Strain: Low Risk   . Difficulty of Paying Living Expenses: Not very hard  Food Insecurity: No Food Insecurity  . Worried About Charity fundraiser in the Last Year: Never true  . Ran Out of Food in the Last Year: Never true  Housing:   . Last Housing Risk Score: Not on file  Physical Activity:   . Days of Exercise per Week: Not on file  . Minutes of Exercise per Session: Not on file  Social Connections:   . Frequency of Communication with Friends and Family: Not on file  . Frequency of Social Gatherings with Friends and Family: Not on file  . Attends Religious Services: Not on file  . Active Member of Clubs or Organizations: Not on file  . Attends Archivist Meetings: Not on file  . Marital Status: Not on file  Stress:   . Feeling of Stress : Not on file  Tobacco Use: Low Risk   . Smoking Tobacco Use: Never Smoker  . Smokeless Tobacco Use: Never Used  Transportation Needs: Unmet Transportation Needs  . Lack of Transportation (Medical): Yes  . Lack of Transportation (Non-Medical): Yes    SDOH Interventions: Financial Resources:  Financial Strain Interventions: Intervention Not Indicated    Food Insecurity:  Food Insecurity Interventions: Intervention Not Indicated  Housing Insecurity:   Pt had shared w/ triage that she is having some challenges w/ her apartment. She did not share this at this time w/ this Probation officer.  Transportation:   Transportation Interventions: Financial planner   Follow-up plan:   Referral made to [employer]. Will f/u before end of business day to ensure ride scheduled.   CSW available as needed for any additional pt concerns.

## 2020-03-16 ENCOUNTER — Other Ambulatory Visit: Payer: Self-pay

## 2020-03-16 ENCOUNTER — Ambulatory Visit: Payer: Medicare Other | Admitting: Cardiology

## 2020-03-16 ENCOUNTER — Encounter: Payer: Self-pay | Admitting: Cardiology

## 2020-03-16 VITALS — BP 146/80 | HR 85 | Ht 66.5 in | Wt 231.0 lb

## 2020-03-16 DIAGNOSIS — I480 Paroxysmal atrial fibrillation: Secondary | ICD-10-CM

## 2020-03-16 DIAGNOSIS — M9901 Segmental and somatic dysfunction of cervical region: Secondary | ICD-10-CM | POA: Diagnosis not present

## 2020-03-16 DIAGNOSIS — Z79899 Other long term (current) drug therapy: Secondary | ICD-10-CM

## 2020-03-16 DIAGNOSIS — M62830 Muscle spasm of back: Secondary | ICD-10-CM | POA: Diagnosis not present

## 2020-03-16 DIAGNOSIS — M9902 Segmental and somatic dysfunction of thoracic region: Secondary | ICD-10-CM | POA: Diagnosis not present

## 2020-03-16 DIAGNOSIS — S338XXA Sprain of other parts of lumbar spine and pelvis, initial encounter: Secondary | ICD-10-CM | POA: Diagnosis not present

## 2020-03-16 NOTE — Patient Instructions (Addendum)
Medication Instructions:  Your Physician recommend you continue on your current medication as directed.    *If you need a refill on your cardiac medications before your next appointment, please call your pharmacy*   Lab Work: Your physician recommends lab work today (BMP).    Testing/Procedures: None   Follow-Up: At [facility], you and your health needs are our priority.  As part of our continuing mission to provide you with exceptional heart care, we have created designated Provider Care Teams.  These Care Teams include your primary Cardiologist (physician) and Advanced Practice Providers (APPs -  Physician Assistants and Nurse Practitioners) who all work together to provide you with the care you need, when you need it.  We recommend signing up for the patient portal called "MyChart".  Sign up information is provided on this After Visit Summary.  MyChart is used to connect with patients for Virtual Visits (Telemedicine).  Patients are able to view lab/test results, encounter notes, upcoming appointments, etc.  Non-urgent messages can be sent to your provider as well.   To learn more about what you can do with MyChart, go to [email or web address].    Your next appointment:   3-4 month(s)  The format for your next appointment:   In Person  Provider:   Buford Dresser, MD   Other Instructions   [place] refers to food and lifestyle choices that are based on the traditions of countries located on the [place]. This way of eating has been shown to help prevent certain conditions and improve outcomes for people who have chronic diseases, like kidney disease and heart disease. What are tips for following this plan? Lifestyle  Cook and eat meals together with your family, when possible.  Drink enough fluid to keep your urine clear or pale yellow.  Be physically active every day. This includes: ? Aerobic exercise like running or  swimming. ? Leisure activities like gardening, walking, or housework.  Get 7-8 hours of sleep each night.  If recommended by your health care provider, drink red wine in moderation. This means 1 glass a day for nonpregnant women and 2 glasses a day for men. A glass of wine equals 5 oz (150 mL). Reading food labels   Check the serving size of packaged foods. For foods such as rice and pasta, the serving size refers to the amount of cooked product, not dry.  Check the total fat in packaged foods. Avoid foods that have saturated fat or trans fats.  Check the ingredients list for added sugars, such as corn syrup. Shopping  At the grocery store, buy most of your food from the areas near the walls of the store. This includes: ? Fresh fruits and vegetables (produce). ? Grains, beans, nuts, and seeds. Some of these may be available in unpackaged forms or large amounts (in bulk). ? Fresh seafood. ? Poultry and eggs. ? Low-fat dairy products.  Buy whole ingredients instead of prepackaged foods.  Buy fresh fruits and vegetables in-season from local farmers markets.  Buy frozen fruits and vegetables in resealable bags.  If you do not have access to quality fresh seafood, buy precooked frozen shrimp or canned fish, such as tuna, salmon, or sardines.  Buy small amounts of raw or cooked vegetables, salads, or olives from the deli or salad bar at your store.  Stock your pantry so you always have certain foods on hand, such as olive oil, canned tuna, canned tomatoes, rice, pasta, and beans. Cooking  [employer]  foods with extra-virgin olive oil instead of using butter or other vegetable oils.  Have meat as a side dish, and have vegetables or grains as your main dish. This means having meat in small portions or adding small amounts of meat to foods like pasta or stew.  Use beans or vegetables instead of meat in common dishes like chili or lasagna.  Experiment with different cooking methods. Try  roasting or broiling vegetables instead of steaming or sauteing them.  Add frozen vegetables to soups, stews, pasta, or rice.  Add nuts or seeds for added healthy fat at each meal. You can add these to yogurt, salads, or vegetable dishes.  Marinate fish or vegetables using olive oil, lemon juice, garlic, and fresh herbs. Meal planning   Plan to eat 1 vegetarian meal one day each week. Try to work up to 2 vegetarian meals, if possible.  Eat seafood 2 or more times a week.  Have healthy snacks readily available, such as: ? Vegetable sticks with hummus. ? [place] yogurt. ? Fruit and nut trail mix.  Eat balanced meals throughout the week. This includes: ? Fruit: 2-3 servings a day ? Vegetables: 4-5 servings a day ? Low-fat dairy: 2 servings a day ? Fish, poultry, or lean meat: 1 serving a day ? Beans and legumes: 2 or more servings a week ? Nuts and seeds: 1-2 servings a day ? Whole grains: 6-8 servings a day ? Extra-virgin olive oil: 3-4 servings a day  Limit red meat and sweets to only a few servings a month What are my food choices?  Mediterranean diet ? Recommended  Grains: Whole-grain pasta. Brown rice. Bulgar wheat. Polenta. Couscous. Whole-wheat bread. [place].  Vegetables: Artichokes. Beets. Broccoli. Cabbage. Carrots. Eggplant. Green beans. Chard. Kale. Spinach. Onions. Leeks. Peas. Squash. Tomatoes. Peppers. Radishes.  Fruits: Apples. Apricots. Avocado. Berries. Bananas. Cherries. Dates. Figs. Grapes. Lemons. Melon. Oranges. Peaches. Plums. Pomegranate.  Meats and other protein foods: Beans. Almonds. Sunflower seeds. Pine nuts. Peanuts. [place]. Salmon. Scallops. Shrimp. [place]. Tilapia. Clams. Oysters. Eggs.  Dairy: Low-fat milk. Cheese. Greek yogurt.  Beverages: Water. Red wine. Herbal tea.  Fats and oils: Extra virgin olive oil. Avocado oil. Grape seed oil.  Sweets and desserts: [place] yogurt with honey. Baked apples. Poached pears. Trail mix.  Seasoning  and other foods: Basil. Cilantro. Coriander. Cumin. Mint. Parsley. Sage. Rosemary. Tarragon. Garlic. Oregano. Thyme. Pepper. Balsalmic vinegar. Tahini. Hummus. Tomato sauce. Olives. Mushrooms. ? Limit these  Grains: Prepackaged pasta or rice dishes. Prepackaged cereal with added sugar.  Vegetables: Deep fried potatoes (french fries).  Fruits: Fruit canned in syrup.  Meats and other protein foods: Beef. Pork. Lamb. Poultry with skin. Hot dogs. Bernie[place] Salines.  Dairy: Ice cream. Sour cream. Whole milk.  Beverages: Juice. Sugar-sweetened soft drinks. Beer. Liquor and spirits.  Fats and oils: Butter. Canola oil. Vegetable oil. Beef fat (tallow). Lard.  Sweets and desserts: Cookies. Cakes. Pies. Candy.  Seasoning and other foods: Mayonnaise. Premade sauces and marinades. The items listed may not be a complete list. Talk with your dietitian about what dietary choices are right for you. Summary  The Mediterranean diet includes both food and lifestyle choices.  Eat a variety of fresh fruits and vegetables, beans, nuts, seeds, and whole grains.  Limit the amount of red meat and sweets that you eat.  Talk with your health care provider about whether it is safe for you to drink red wine in moderation. This means 1 glass a day for nonpregnant women and 2 glasses  a day for men. A glass of wine equals 5 oz (150 mL). This information is not intended to replace advice given to you by your health care provider. Make sure you discuss any questions you have with your health care provider. Document Revised: 11/25/2015 Document Reviewed: 11/18/2015 Elsevier Patient Education  [place].

## 2020-03-16 NOTE — Progress Notes (Signed)
Cardiology Office Note:    Date:  03/16/2020   ID:  Cynthia Cook, DOB 11/01/1955, MRN [id number]  PCP:  Eunice Blase, MD  Cardiologist:  Buford Dresser, MD  Referring MD: Shon Baton, MD   CC: close follow up  History of Present Illness:    Cynthia Cook is a 64 y.o. female with a hx of chest pain, abnormal echo, and paroxysmal atrial fibrillation.   Today: We had planned for outpatient cardioversion after last visit, but the symptoms were so severe that she presented to the ER. She underwent cardioversion and felt much better. However, since that time she has had tachycardia and palpitations. She has had significant stress in her life, so much so that she felt uncomfortable driving herself to the appointment. We were able to arrange a ride for her through a transportation service today.   Please see mychart message responses re:NK supplement and lack of data re: stroke reduction in atrial fibrillation. We are working to help get her DOAC affordably at this time. We have also discussed coumadin, which is less ideal but more affordable.  Has felt tachycardia since the moment she wakes up. BP usually around 120/70, HR usually 80s. Nothing over 92 bpm. Feels ok if she can stay at home and rest, but any stress or plan for activity (ie having to come to the doctor's office) increases her pulse. She can walk with her dog about 1/4 mile each way.  Overall feels much better than prior cardioversion. Has not had any episodes of severe symptoms since the cardioversion.  Denies chest pain, shortness of breath at rest or with normal exertion. No PND, orthopnea, LE edema or unexpected weight gain. No syncope or palpitations.   Past Medical History:  Diagnosis Date   Back pain    Murmur    PE (pulmonary embolism)    Persistent atrial fibrillation (HCC)    Syncope    Thyroid disease     No past surgical history on file.  Current Medications: Current Outpatient Medications on File  Prior to Visit  Medication Sig   allopurinol (ZYLOPRIM) 100 MG tablet Take 100 mg by mouth daily as needed (gout flare).   apixaban (ELIQUIS) 5 MG TABS tablet Take 1 tablet (5 mg total) by mouth 2 (two) times daily.   ascorbic acid (VITAMIN C) 500 MG tablet Take 500 mg by mouth daily.   ASTRAGALUS PO Take 500 mg by mouth daily.   b complex vitamins capsule Take 1 capsule by mouth daily.   Coenzyme Q10 (COQ-10) 400 MG CAPS Take 400 mg by mouth daily.   Homeopathic Products (ARNICA EX) Apply 1 application topically daily as needed (knee pain).   levothyroxine (SYNTHROID) 50 MCG tablet Take 50 mcg by mouth daily.    MAGNESIUM GLYCINATE PO Take 400 mg by mouth daily.   Multiple Vitamins-Minerals (MULTIVITAMIN WITH MINERALS) tablet Take 1 tablet by mouth daily.   Potassium 99 MG TABS Take 297 mg by mouth daily.   Selenium 100 MCG CAPS Take 100 mcg by mouth daily.    triamcinolone cream (KENALOG) 0.1 % Apply 1 application topically daily as needed (rash).    Turmeric Curcumin 500 MG CAPS Take 1,000 mg by mouth daily as needed (pain).   vitamin A 3 MG (10000 UNITS) capsule Take 10,000 Units by mouth daily.   Vitamin D-Vitamin K (VITAMIN K2-VITAMIN D3 PO) Take 1 tablet by mouth daily.    Zinc 50 MG CAPS Take 50 mg by mouth daily.  No current facility-administered medications on file prior to visit.     Allergies:   Codeine and Fentanyl   Social History   Tobacco Use   Smoking status: Never Smoker   Smokeless tobacco: Never Used  Vaping Use   Vaping Use: Never used  Substance Use Topics   Alcohol use: No   Drug use: Not on file    Family History: Family history is unknown by patient.  ROS:   Please see the history of present illness.  Additional pertinent ROS otherwise unremarkable.   EKGs/Labs/Other Studies Reviewed:    The following studies were reviewed today: Echo 02/06/20 1. Left ventricular ejection fraction, by estimation, is 55 to 60%. The  left ventricle has normal  function. The left ventricle has no regional  wall motion abnormalities. There is mild concentric left ventricular  hypertrophy. Left ventricular diastolic  function could not be evaluated.   2. Moderate calcific mitral stenosis is present. MG 5.6 mmHG @ 109 bpm.  MVA by VTI 1.86 cm2. Mild to moderate MR is present which is related to  restricted movement of the PMVL (IIIB). The mitral valve is degenerative.  Mild to moderate mitral valve  regurgitation. Moderate mitral stenosis. Severe mitral annular  calcification.   3. Mild to moderate AS is present. V max 2.25 m/s, MG 12.3 mmHG, AVA 1.29  cm2, DI 0.34. Gradients lower than expected due to low SVI (SV=56 cc,  SVI=26 cc/m2). The aortic valve is tricuspid. There is moderate  calcification of the aortic valve. There is  moderate thickening of the aortic valve. Aortic valve regurgitation is not  visualized. Mild to moderate aortic valve stenosis. Aortic valve area, by  VTI measures 1.29 cm. Aortic valve mean gradient measures 12.3 mmHg.  Aortic valve Vmax measures 2.25  m/s.   4. Right ventricular systolic function is normal. The right ventricular  size is mildly enlarged. There is mildly elevated pulmonary artery  systolic pressure. The estimated right ventricular systolic pressure is  69.6 mmHg.   5. Left atrial size was severely dilated.   6. The inferior vena cava is dilated in size with <50% respiratory  variability, suggesting right atrial pressure of 15 mmHg.   Comparison(s): Changes from prior study are noted. Afib with RVR is now  present. Valvular heart disease is stable.   CT PE 02/27/20 FINDINGS: Cardiovascular: There is adequate opacification of the pulmonary arterial tree. There is no intraluminal filling defect identified to suggest acute pulmonary embolism. The central pulmonary arteries are enlarged in keeping with pulmonary arterial hypertension. There is extensive coronary artery calcification. There is  extensive calcification of the mitral valve annulus. Mild calcification of the aortic valve leaflets. There is mild global cardiomegaly. No pericardial effusion. Atherosclerotic calcification is seen within the thoracic aorta. No aortic aneurysm.   Mediastinum/Nodes: No pathologic thoracic adenopathy. Esophagus is unremarkable.   Lungs/Pleura: Trace right and small left pleural effusions are present. Mild bibasilar atelectasis. No superimposed confluent pulmonary infiltrate. No pneumothorax. Central airways are widely patent.   Upper Abdomen: Unremarkable   Musculoskeletal: No acute bone abnormality.   Review of the MIP images confirms the above findings.   IMPRESSION: No pulmonary embolism.   Morphologic changes of pulmonary arterial hypertension.   Mild global cardiomegaly.  Extensive coronary artery calcification.   Extensive calcification of the mitral valve annulus noted. Echocardiography may be helpful to assess the degree of valvular dysfunction.   Trace right and small left pleural effusions, slightly increased since prior examination.  EKG:  EKG is personally reviewed.  The ekg ordered today demonstrates normal sinus rhythm at 85 bpm.  Recent Labs: 02/05/2020: B Natriuretic Peptide 341.7 02/06/2020: TSH 3.024 02/27/2020: Magnesium 2.3 02/28/2020: ALT 11; BUN 40; Creatinine, Ser 1.64; Hemoglobin 12.6; Platelets 174; Potassium 4.7; Sodium 139  Recent Lipid Panel    Component Value Date/Time   CHOL 172 02/07/2020 0138   TRIG 118 02/07/2020 0138   HDL 36 (L) 02/07/2020 0138   CHOLHDL 4.8 02/07/2020 0138   VLDL 24 02/07/2020 0138   LDLCALC 112 (H) 02/07/2020 0138    Physical Exam:    VS:  BP (!) 146/80    Pulse 85    Ht 5' 6.5" (1.689 m)    Wt 231 lb (104.8 kg)    SpO2 99%    BMI 36.73 kg/m     Wt Readings from Last 3 Encounters:  03/16/20 231 lb (104.8 kg)  02/26/20 230 lb (104.3 kg)  02/18/20 227 lb 15.3 oz (103.4 kg)    GEN: Well nourished, well  developed in no acute distress HEENT: Normal, moist mucous membranes NECK: No JVD CARDIAC: regular rhythm, normal S1 and S2, no rubs or gallops. No murmur. VASCULAR: Radial and DP pulses 2+ bilaterally. No carotid bruits RESPIRATORY:  Clear to auscultation without rales, wheezing or rhonchi  ABDOMEN: Soft, non-tender, non-distended MUSCULOSKELETAL:  Ambulates independently SKIN: Warm and dry, no edema NEUROLOGIC:  Alert and oriented x 3. No focal neuro deficits noted. PSYCHIATRIC:  Normal affect    ASSESSMENT:    No diagnosis found. PLAN:    Paroxysmal atrial fibrillation: CHA2DS2/VAS Stroke Risk Points= 2   -we discussed stroke risk at length. She agrees to take apixaban for at least one month post cardioversion. I recommended long term anticoagulation given her chadsvasc and risk of stroke. She prefers natural alternatives, but we discussed that these have not been proven to prevent stroke.  ADDENDED to add: this is a my chart message from the patient dated 03/30/20: [name] to Me   BF   6:30 AM For over 5 decades of life I've been extremely cold tolerant. [place], known for brutal winters that last for months. It was routine for me to go out in the snow wearing a tee shirt and jeans, and be completely comfortable. Since initiating Eliquis I've been cold constantly, worse in the past several days. The heat in my apartment is at 79 and I remain inside wearing 2 sweaters, 2 pair of socks and covered by 2 blankets, a quilt and a heating pad. And I'm still cold. I've completed the post-30 day   conversion Eliquis dose and have decided to stop taking it. Dr. Harrell Gave explained the potential implications of the results of this decision carefully and completely at my last visit. I take fully informed responsibility for any and all results. I will be returning to the combined anticoagulant effects of garlic, oral vitamin E and nattokinase / streptokinase.  And I will not resume fish oil, based on the scary AHA article [Fish Oil Supplements May Increase the Risk for Atrial Fibrillation: What Does This Mean? Michelle Samuel].  There are clearly still questions about this meta-analysis but I had just begun taking fish oil less than  3 weeks before I developed afib and am not going to risk it.    I hope everyone in your office has a happy, healthy holiday season. I look forward to seeing you in March,  hopefully 30 pounds lighter  and with perfect labs! :)   Siarra Gilkerson    Hypercholesterolemia, coronary artery calcification: -see extensive prior discussion. Declines statins, willing to try ezetimibe -recommended for cardiac CT given prior chest pain, declines -we do not recommend nor offer chelation therapy -last LDL 112, recommended goal <70   Cardiac risk counseling and prevention recommendations: -recommend heart healthy/Mediterranean diet, with whole grains, fruits, vegetable, fish, lean meats, nuts, and olive oil. Limit salt. -recommend moderate walking, 3-5 times/week for 30-50 minutes each session. Aim for at least 150 minutes.week. Goal should be pace of 3 miles/hours, or walking 1.5 miles in 30 minutes -recommend avoidance of tobacco products. Avoid excess alcohol. -ASCVD risk score: The 10-year ASCVD risk score Mikey Bussing DC Brooke Bonito., et al., 2013) is: 8%   Values used to calculate the score:     Age: 51 years     Sex: Female     Is Non-Hispanic African American: No     Diabetic: No     Tobacco smoker: No     Systolic Blood Pressure: 767 mmHg     Is BP treated: No     HDL Cholesterol: 36 mg/dL     Total Cholesterol: 172 mg/dL    Plan for follow up: 3-4 months  Buford Dresser, MD, PhD [place]   [facility]    Medication Adjustments/Labs and Tests Ordered: Current medicines are reviewed at length with the patient today.  Concerns regarding medicines are outlined above.  Orders Placed This Encounter  Procedures   Basic  metabolic panel   EKG 20-NOBS   No orders of the defined types were placed in this encounter.   Patient Instructions  Medication Instructions:  Your Physician recommend you continue on your current medication as directed.    *If you need a refill on your cardiac medications before your next appointment, please call your pharmacy*   Lab Work: Your physician recommends lab work today (BMP).    Testing/Procedures: None   Follow-Up: At [facility], you and your health needs are our priority.  As part of our continuing mission to provide you with exceptional heart care, we have created designated Provider Care Teams.  These Care Teams include your primary Cardiologist (physician) and Advanced Practice Providers (APPs -  Physician Assistants and Nurse Practitioners) who all work together to provide you with the care you need, when you need it.  We recommend signing up for the patient portal called "MyChart".  Sign up information is provided on this After Visit Summary.  MyChart is used to connect with patients for Virtual Visits (Telemedicine).  Patients are able to view lab/test results, encounter notes, upcoming appointments, etc.  Non-urgent messages can be sent to your provider as well.   To learn more about what you can do with MyChart, go to [email or web address].    Your next appointment:   3-4 month(s)  The format for your next appointment:   In Person  Provider:   Buford Dresser, MD   Other Instructions   [place] refers to food and lifestyle choices that are based on the traditions of countries located on the [place]. This way of eating has been shown to help prevent certain conditions and improve outcomes for people who have chronic diseases, like kidney disease and heart disease. What are tips for following this plan? Lifestyle Cook and eat meals together with your family, when possible. Drink enough fluid to  keep your urine clear or pale yellow. Be physically active every day.  This includes: Aerobic exercise like running or swimming. Leisure activities like gardening, walking, or housework. Get 7-8 hours of sleep each night. If recommended by your health care provider, drink red wine in moderation. This means 1 glass a day for nonpregnant women and 2 glasses a day for men. A glass of wine equals 5 oz (150 mL). Reading food labels  Check the serving size of packaged foods. For foods such as rice and pasta, the serving size refers to the amount of cooked product, not dry. Check the total fat in packaged foods. Avoid foods that have saturated fat or trans fats. Check the ingredients list for added sugars, such as corn syrup. Shopping At the grocery store, buy most of your food from the areas near the walls of the store. This includes: Fresh fruits and vegetables (produce). Grains, beans, nuts, and seeds. Some of these may be available in unpackaged forms or large amounts (in bulk). Fresh seafood. Poultry and eggs. Low-fat dairy products. Buy whole ingredients instead of prepackaged foods. Buy fresh fruits and vegetables in-season from local farmers markets. Buy frozen fruits and vegetables in resealable bags. If you do not have access to quality fresh seafood, buy precooked frozen shrimp or canned fish, such as tuna, salmon, or sardines. Buy small amounts of raw or cooked vegetables, salads, or olives from the deli or salad bar at your store. Stock your pantry so you always have certain foods on hand, such as olive oil, canned tuna, canned tomatoes, rice, pasta, and beans. Cooking Cook foods with extra-virgin olive oil instead of using butter or other vegetable oils. Have meat as a side dish, and have vegetables or grains as your main dish. This means having meat in small portions or adding small amounts of meat to foods like pasta or stew. Use beans or vegetables instead of meat in common dishes  like chili or lasagna. Experiment with different cooking methods. Try roasting or broiling vegetables instead of steaming or sauteing them. Add frozen vegetables to soups, stews, pasta, or rice. Add nuts or seeds for added healthy fat at each meal. You can add these to yogurt, salads, or vegetable dishes. Marinate fish or vegetables using olive oil, lemon juice, garlic, and fresh herbs. Meal planning  Plan to eat 1 vegetarian meal one day each week. Try to work up to 2 vegetarian meals, if possible. Eat seafood 2 or more times a week. Have healthy snacks readily available, such as: Vegetable sticks with hummus. Greek yogurt. Fruit and nut trail mix. Eat balanced meals throughout the week. This includes: Fruit: 2-3 servings a day Vegetables: 4-5 servings a day Low-fat dairy: 2 servings a day Fish, poultry, or lean meat: 1 serving a day Beans and legumes: 2 or more servings a week Nuts and seeds: 1-2 servings a day Whole grains: 6-8 servings a day Extra-virgin olive oil: 3-4 servings a day Limit red meat and sweets to only a few servings a month What are my food choices? Mediterranean diet Recommended Grains: Whole-grain pasta. Brown rice. Bulgar wheat. Polenta. Couscous. Whole-wheat bread. [place]. Vegetables: Artichokes. Beets. Broccoli. Cabbage. Carrots. Eggplant. Green beans. Chard. Kale. Spinach. Onions. Leeks. Peas. Squash. Tomatoes. Peppers. Radishes. Fruits: Apples. Apricots. Avocado. Berries. Bananas. Cherries. Dates. Figs. Grapes. Lemons. Melon. Oranges. Peaches. Plums. Pomegranate. Meats and other protein foods: Beans. Almonds. Sunflower seeds. Pine nuts. Peanuts. [place]. Salmon. Scallops. Shrimp. [place]. Tilapia. Clams. Oysters. Eggs. Dairy: Low-fat milk. Cheese. Greek yogurt. Beverages: Water. Red wine. Herbal tea. Fats and oils: Extra virgin  olive oil. Avocado oil. Grape seed oil. Sweets and desserts: [place] yogurt with honey. Baked apples. Poached pears. Trail  mix. Seasoning and other foods: Basil. Cilantro. Coriander. Cumin. Mint. Parsley. Sage. Rosemary. Tarragon. Garlic. Oregano. Thyme. Pepper. Balsalmic vinegar. Tahini. Hummus. Tomato sauce. Olives. Mushrooms. Limit these Grains: Prepackaged pasta or rice dishes. Prepackaged cereal with added sugar. Vegetables: Deep fried potatoes (french fries). Fruits: Fruit canned in syrup. Meats and other protein foods: Beef. Pork. Lamb. Poultry with skin. Hot dogs. [place] Salines. Dairy: Ice cream. Sour cream. Whole milk. Beverages: Juice. Sugar-sweetened soft drinks. Beer. Liquor and spirits. Fats and oils: Butter. Canola oil. Vegetable oil. Beef fat (tallow). Lard. Sweets and desserts: Cookies. Cakes. Pies. Candy. Seasoning and other foods: Mayonnaise. Premade sauces and marinades. The items listed may not be a complete list. Talk with your dietitian about what dietary choices are right for you. Summary The Mediterranean diet includes both food and lifestyle choices. Eat a variety of fresh fruits and vegetables, beans, nuts, seeds, and whole grains. Limit the amount of red meat and sweets that you eat. Talk with your health care provider about whether it is safe for you to drink red wine in moderation. This means 1 glass a day for nonpregnant women and 2 glasses a day for men. A glass of wine equals 5 oz (150 mL). This information is not intended to replace advice given to you by your health care provider. Make sure you discuss any questions you have with your health care provider. Document Revised: 11/25/2015 Document Reviewed: 11/18/2015 Elsevier Patient Education  2020 [employer].  Signed, Buford Dresser, MD PhD 03/16/2020   [place]

## 2020-03-17 LAB — BASIC METABOLIC PANEL
BUN/Creatinine Ratio: 17 (ref 12–28)
BUN: 19 mg/dL (ref 8–27)
CO2: 25 mmol/L (ref 20–29)
Calcium: 9.8 mg/dL (ref 8.7–10.3)
Chloride: 102 mmol/L (ref 96–106)
Creatinine, Ser: 1.15 mg/dL — ABNORMAL HIGH (ref 0.57–1.00)
GFR calc Af Amer: 57 mL/min/{1.73_m2} — ABNORMAL LOW (ref >59)
GFR calc non Af Amer: 50 mL/min/{1.73_m2} — ABNORMAL LOW (ref >59)
Glucose: 81 mg/dL (ref 65–99)
Potassium: 4.6 mmol/L (ref 3.5–5.2)
Sodium: 144 mmol/L (ref 134–144)

## 2020-03-18 DIAGNOSIS — S338XXA Sprain of other parts of lumbar spine and pelvis, initial encounter: Secondary | ICD-10-CM | POA: Diagnosis not present

## 2020-03-18 DIAGNOSIS — M9902 Segmental and somatic dysfunction of thoracic region: Secondary | ICD-10-CM | POA: Diagnosis not present

## 2020-03-18 DIAGNOSIS — M9901 Segmental and somatic dysfunction of cervical region: Secondary | ICD-10-CM | POA: Diagnosis not present

## 2020-03-18 DIAGNOSIS — M62830 Muscle spasm of back: Secondary | ICD-10-CM | POA: Diagnosis not present

## 2020-03-23 ENCOUNTER — Encounter: Payer: Self-pay | Admitting: Family Medicine

## 2020-03-23 DIAGNOSIS — M9901 Segmental and somatic dysfunction of cervical region: Secondary | ICD-10-CM | POA: Diagnosis not present

## 2020-03-23 DIAGNOSIS — S338XXA Sprain of other parts of lumbar spine and pelvis, initial encounter: Secondary | ICD-10-CM | POA: Diagnosis not present

## 2020-03-23 DIAGNOSIS — M9902 Segmental and somatic dysfunction of thoracic region: Secondary | ICD-10-CM | POA: Diagnosis not present

## 2020-03-23 DIAGNOSIS — M62830 Muscle spasm of back: Secondary | ICD-10-CM | POA: Diagnosis not present

## 2020-03-25 DIAGNOSIS — M62830 Muscle spasm of back: Secondary | ICD-10-CM | POA: Diagnosis not present

## 2020-03-25 DIAGNOSIS — M9902 Segmental and somatic dysfunction of thoracic region: Secondary | ICD-10-CM | POA: Diagnosis not present

## 2020-03-25 DIAGNOSIS — S338XXA Sprain of other parts of lumbar spine and pelvis, initial encounter: Secondary | ICD-10-CM | POA: Diagnosis not present

## 2020-03-25 DIAGNOSIS — M9901 Segmental and somatic dysfunction of cervical region: Secondary | ICD-10-CM | POA: Diagnosis not present

## 2020-03-31 ENCOUNTER — Encounter: Payer: Self-pay | Admitting: Family Medicine

## 2020-03-31 MED ORDER — METHYLPREDNISOLONE 4 MG PO TBPK: ORAL_TABLET | ORAL | 0 refills | Status: DC

## 2020-03-31 MED ORDER — TRAMADOL HCL 50 MG PO TABS
50.0000 mg | ORAL_TABLET | Freq: Four times a day (QID) | ORAL | 0 refills | Status: DC | PRN
Start: 2020-03-31 — End: 2020-06-29

## 2020-03-31 MED ORDER — COLCHICINE 0.6 MG PO CAPS: 1.0000 | ORAL_CAPSULE | Freq: Two times a day (BID) | ORAL | 3 refills | Status: DC | PRN

## 2020-04-06 ENCOUNTER — Encounter: Payer: Self-pay | Admitting: Family Medicine

## 2020-04-12 ENCOUNTER — Encounter: Payer: Self-pay | Admitting: Cardiology

## 2020-04-15 DIAGNOSIS — M62830 Muscle spasm of back: Secondary | ICD-10-CM | POA: Diagnosis not present

## 2020-04-15 DIAGNOSIS — S338XXA Sprain of other parts of lumbar spine and pelvis, initial encounter: Secondary | ICD-10-CM | POA: Diagnosis not present

## 2020-04-15 DIAGNOSIS — M9901 Segmental and somatic dysfunction of cervical region: Secondary | ICD-10-CM | POA: Diagnosis not present

## 2020-04-15 DIAGNOSIS — M9902 Segmental and somatic dysfunction of thoracic region: Secondary | ICD-10-CM | POA: Diagnosis not present

## 2020-04-16 DIAGNOSIS — M62830 Muscle spasm of back: Secondary | ICD-10-CM | POA: Diagnosis not present

## 2020-04-16 DIAGNOSIS — S338XXA Sprain of other parts of lumbar spine and pelvis, initial encounter: Secondary | ICD-10-CM | POA: Diagnosis not present

## 2020-04-16 DIAGNOSIS — M9902 Segmental and somatic dysfunction of thoracic region: Secondary | ICD-10-CM | POA: Diagnosis not present

## 2020-04-16 DIAGNOSIS — M9901 Segmental and somatic dysfunction of cervical region: Secondary | ICD-10-CM | POA: Diagnosis not present

## 2020-04-22 DIAGNOSIS — S338XXA Sprain of other parts of lumbar spine and pelvis, initial encounter: Secondary | ICD-10-CM | POA: Diagnosis not present

## 2020-04-22 DIAGNOSIS — M9901 Segmental and somatic dysfunction of cervical region: Secondary | ICD-10-CM | POA: Diagnosis not present

## 2020-04-22 DIAGNOSIS — M9902 Segmental and somatic dysfunction of thoracic region: Secondary | ICD-10-CM | POA: Diagnosis not present

## 2020-04-22 DIAGNOSIS — M62830 Muscle spasm of back: Secondary | ICD-10-CM | POA: Diagnosis not present

## 2020-05-10 ENCOUNTER — Encounter: Payer: Self-pay | Admitting: Family Medicine

## 2020-05-24 DIAGNOSIS — S338XXA Sprain of other parts of lumbar spine and pelvis, initial encounter: Secondary | ICD-10-CM | POA: Diagnosis not present

## 2020-05-24 DIAGNOSIS — M62830 Muscle spasm of back: Secondary | ICD-10-CM | POA: Diagnosis not present

## 2020-05-24 DIAGNOSIS — M9902 Segmental and somatic dysfunction of thoracic region: Secondary | ICD-10-CM | POA: Diagnosis not present

## 2020-05-24 DIAGNOSIS — M9901 Segmental and somatic dysfunction of cervical region: Secondary | ICD-10-CM | POA: Diagnosis not present

## 2020-05-27 ENCOUNTER — Encounter: Payer: Self-pay | Admitting: Family Medicine

## 2020-05-31 ENCOUNTER — Telehealth: Payer: Self-pay

## 2020-05-31 DIAGNOSIS — M9902 Segmental and somatic dysfunction of thoracic region: Secondary | ICD-10-CM | POA: Diagnosis not present

## 2020-05-31 DIAGNOSIS — M9901 Segmental and somatic dysfunction of cervical region: Secondary | ICD-10-CM | POA: Diagnosis not present

## 2020-05-31 DIAGNOSIS — S338XXA Sprain of other parts of lumbar spine and pelvis, initial encounter: Secondary | ICD-10-CM | POA: Diagnosis not present

## 2020-05-31 DIAGNOSIS — M62830 Muscle spasm of back: Secondary | ICD-10-CM | POA: Diagnosis not present

## 2020-05-31 NOTE — Telephone Encounter (Signed)
   Primary Cardiologist: Buford Dresser, MD  Chart reviewed as part of pre-operative protocol coverage.   Simple dental extractions are considered low risk procedures per guidelines and generally do not require any specific cardiac clearance. It is also generally accepted that for simple extractions and dental cleanings, there is no need to interrupt blood thinner therapy.  SBE prophylaxis is not required for the patient from a cardiac standpoint.  I will route this recommendation to the requesting party via Epic fax function and remove from pre-op pool.  Please call with questions.  Loel Dubonnet, NP 05/31/2020, 11:19 AM

## 2020-05-31 NOTE — Telephone Encounter (Signed)
   [facility] Pre-operative Risk Assessment    HEARTCARE STAFF: - Please ensure there is not already an duplicate clearance open for this procedure. - Under Visit Info/Reason for Call, type in Other and utilize the format Clearance MM/DD/YY or Clearance TBD. Do not use dashes or single digits. - If request is for dental extraction, please clarify the # of teeth to be extracted.  Request for surgical clearance:  1. What type of surgery is being performed? Extraction Tooth #4, Deep Cleaning and Crown  2. When is this surgery scheduled? TBD  3. What type of clearance is required (medical clearance vs. Pharmacy clearance to hold med vs. Both)? both  4. Are there any medications that need to be held prior to surgery and how long?Eliquis   5. Practice name and name of physician performing surgery? [place], Dr. Vale Haven, DDS  6. What is the office phone number? [phone]   7.   What is the office fax number? [phone]  8.   Anesthesia type (None, local, MAC, general) ? Local (with epinephrine)   Cynthia Cook 05/31/2020, 9:54 AM  _________________________________________________________________   (provider comments below)

## 2020-06-01 ENCOUNTER — Telehealth: Payer: Self-pay | Admitting: Cardiology

## 2020-06-01 NOTE — Telephone Encounter (Signed)
   Primary Cardiologist: Buford Dresser, MD  Chart reviewed as part of pre-operative protocol coverage.   Called dental office for clarification. Miss Lanter is having deep root cleaning and single tooth extraction. Of note, this is duplicate request, see telephone encounter 06/01/20.   Simple dental extractions are considered low risk procedures per guidelines and generally do not require any specific cardiac clearance. It is also generally accepted that for simple extractions and dental cleanings, there is no need to interrupt blood thinner therapy.  SBE prophylaxis is not required for the patient from a cardiac standpoint.  I will route this recommendation as well as the previous recommendation to the requesting party via Epic fax function and remove from pre-op pool.  Please call with questions.  Loel Dubonnet, NP 06/01/2020, 3:23 PM

## 2020-06-01 NOTE — Telephone Encounter (Signed)
1. What dental office are you calling from? Dr Vale Haven*   2. What is your office phone number?  [phone]   3. What is your fax number?  [phone]  4. What type of procedure is the patient having performed?deep root cleaning and extraction   5. What date is procedure scheduled or is the patient there now?  Pending  (if the patient is at the dentist's office question goes to their cardiologist if he/she is in the office.  If not, question should go to the DOD).   6. What is your question (ex. Antibiotics prior to procedure, holding medication-we need to know how long dentist wants pt to hold med)? About stopping her blood thinner and when

## 2020-06-05 ENCOUNTER — Encounter: Payer: Self-pay | Admitting: Family Medicine

## 2020-06-07 MED ORDER — METHYLPREDNISOLONE 4 MG PO TBPK: ORAL_TABLET | ORAL | 0 refills | Status: DC

## 2020-06-14 DIAGNOSIS — S338XXA Sprain of other parts of lumbar spine and pelvis, initial encounter: Secondary | ICD-10-CM | POA: Diagnosis not present

## 2020-06-14 DIAGNOSIS — M9901 Segmental and somatic dysfunction of cervical region: Secondary | ICD-10-CM | POA: Diagnosis not present

## 2020-06-14 DIAGNOSIS — M9902 Segmental and somatic dysfunction of thoracic region: Secondary | ICD-10-CM | POA: Diagnosis not present

## 2020-06-14 DIAGNOSIS — M62830 Muscle spasm of back: Secondary | ICD-10-CM | POA: Diagnosis not present

## 2020-06-22 ENCOUNTER — Ambulatory Visit: Payer: Medicare Other | Admitting: Cardiology

## 2020-06-29 ENCOUNTER — Other Ambulatory Visit: Payer: Self-pay

## 2020-06-29 ENCOUNTER — Encounter: Payer: Self-pay | Admitting: Family Medicine

## 2020-06-29 ENCOUNTER — Ambulatory Visit (INDEPENDENT_AMBULATORY_CARE_PROVIDER_SITE_OTHER): Payer: Medicare Other | Admitting: Family Medicine

## 2020-06-29 VITALS — BP 131/80 | HR 80 | Ht 66.5 in | Wt 224.6 lb

## 2020-06-29 DIAGNOSIS — E063 Autoimmune thyroiditis: Secondary | ICD-10-CM | POA: Insufficient documentation

## 2020-06-29 DIAGNOSIS — E6609 Other obesity due to excess calories: Secondary | ICD-10-CM | POA: Diagnosis not present

## 2020-06-29 DIAGNOSIS — L989 Disorder of the skin and subcutaneous tissue, unspecified: Secondary | ICD-10-CM

## 2020-06-29 DIAGNOSIS — Z6836 Body mass index (BMI) 36.0-36.9, adult: Secondary | ICD-10-CM

## 2020-06-29 NOTE — Progress Notes (Signed)
Office Visit Note   Patient: Cynthia Cook           Date of Birth: 06/06/1955           MRN: [id number] Visit Date: 06/29/2020 Requested by: Eunice Blase, MD [address] [place],  [place] [address] PCP: Eunice Blase, MD  Subjective: Chief Complaint  Patient presents with  . Other    Would like to discuss nutrition/weight loss. Walks her dog daily, at least 1 mile. Her personal goal is to lose another 50 lbs.  Also, has a lump on her scalp, and red, irritated skin in several locations.    HPI: She is here to discuss difficulty losing weight.  In the past she lost a substantial amount of weight following the Atkins diet.  This time around, she has tried following a low carbohydrate diet but is not having the same success.  She wants to lose another 50 pounds.  She tries to walk her dog every day.  She has eliminated gluten and dairy from her diet.  She is status post cholecystectomy and wonders whether she might need digestive enzymes.  She has Hashimoto's thyroiditis and is currently on Synthroid 50 mcg daily.  From a prescription medicine standpoint, she has stopped almost all of her prescriptions including Eliquis due to cost of medications.  She is on nattokinase as an anticoagulant and is taking coenzyme Q 10 with good results from some chest discomfort she was experiencing.  She is fully aware of risks of not using Eliquis, but she has not had atrial fibrillation since her cardioversion and she was very symptomatic and when she was in atrial fibrillation, so she feels comfortable that she would know whether she had it again.  She has a skin lesion on her scalp that she would like evaluated.  It is asymptomatic.  She has chafing of the skin in the abdominal area due to excess skin, she was able to have improvement in symptoms with nystatin powder, but as soon as she stopped using it it came right back.               ROS:   All other systems were reviewed and are  negative.  Objective: Vital Signs: BP 131/80   Pulse 80   Ht 5' 6.5" (1.689 m)   Wt 224 lb 9.6 oz (101.9 kg)   BMI 35.71 kg/m   Physical Exam:  General:  Alert and oriented, in no acute distress. Pulm:  Breathing unlabored. Psy:  Normal mood, congruent affect. Skin: There is probable tinea cruris in the intertriginous area of her abdomen.  No cellulitis.  On her scalp, there is a probable seborrheic keratosis.  A photo was taken for her chart.   Imaging: No results found.  Assessment & Plan: 1.  Obesity, having difficulty losing more weight. - Will try OTC digestive enzymes. - Will add vitamins to support thyroid function. - Continue low-carb diet. - If not improving, will recheck thyroid function and adjust synthroid.  2.  Skin rash - Try saccharomyces.  3.  Skin lesion of scalp - Photo taken.  Will monitor in 6 months.  Offered cryotherapy but she's not interested right now.     Procedures: No procedures performed        PMFS History: Patient Active Problem List   Diagnosis Date Noted  . Hashimoto's thyroiditis 06/29/2020  . Syncope 02/27/2020  . Elevated troponin I level   . Atrial fibrillation ([place]) 02/17/2020  . Class 2  obesity due to excess calories without serious comorbidity with body mass index (BMI) of 36.0 to 36.9 in adult 02/17/2020  . Hypothyroidism 02/06/2020  . Pulmonary emboli ([facility]) 02/05/2020  . ARF (acute renal failure) ([facility]) 02/05/2020   Past Medical History:  Diagnosis Date  . Back pain   . Murmur   . PE (pulmonary embolism)   . Persistent atrial fibrillation ([facility] [facility])   . Syncope   . Thyroid disease     Family History  Family history unknown: Yes    History reviewed. No pertinent surgical history. Social History   Occupational History  . Not on file  Tobacco Use  . Smoking status: Never Smoker  . Smokeless tobacco: Never Used  Vaping Use  . Vaping Use: Never used  Substance and Sexual Activity  . Alcohol use: No  . Drug  use: Not on file  . Sexual activity: Not on file

## 2020-07-23 DIAGNOSIS — S338XXA Sprain of other parts of lumbar spine and pelvis, initial encounter: Secondary | ICD-10-CM | POA: Diagnosis not present

## 2020-07-23 DIAGNOSIS — M9901 Segmental and somatic dysfunction of cervical region: Secondary | ICD-10-CM | POA: Diagnosis not present

## 2020-07-23 DIAGNOSIS — M9902 Segmental and somatic dysfunction of thoracic region: Secondary | ICD-10-CM | POA: Diagnosis not present

## 2020-07-23 DIAGNOSIS — M62830 Muscle spasm of back: Secondary | ICD-10-CM | POA: Diagnosis not present

## 2020-08-02 ENCOUNTER — Encounter: Payer: Self-pay | Admitting: Family Medicine

## 2020-08-04 ENCOUNTER — Ambulatory Visit: Payer: Medicare Other | Admitting: Family Medicine

## 2020-08-08 ENCOUNTER — Encounter: Payer: Self-pay | Admitting: Family Medicine

## 2020-08-30 ENCOUNTER — Telehealth: Payer: Self-pay

## 2020-08-30 NOTE — Telephone Encounter (Signed)
Left voice mail to call back with name(s) of other medication(s) needing to be filled (besides her thyroid medication).

## 2020-08-30 NOTE — Telephone Encounter (Signed)
Patient called she is requesting a call back regarding Dr.Hilts and his new practice she also stated she needs a rx refill for synthroid and a couple other rx she is requesting a call back from you:[phone]

## 2020-08-30 NOTE — Telephone Encounter (Signed)
I called the patient. Appointment scheduled for 09/03/20 at 1:40 for a medication check with Dr. Junius Roads.

## 2020-09-01 ENCOUNTER — Encounter: Payer: Self-pay | Admitting: Family Medicine

## 2020-09-03 ENCOUNTER — Ambulatory Visit: Payer: Medicare Other | Admitting: Family Medicine

## 2020-09-07 ENCOUNTER — Encounter: Payer: Self-pay | Admitting: Family Medicine

## 2020-09-07 DIAGNOSIS — M542 Cervicalgia: Secondary | ICD-10-CM

## 2020-09-08 ENCOUNTER — Encounter: Payer: Self-pay | Admitting: Family Medicine

## 2020-09-08 ENCOUNTER — Ambulatory Visit: Payer: Self-pay

## 2020-09-08 ENCOUNTER — Other Ambulatory Visit: Payer: Self-pay

## 2020-09-08 ENCOUNTER — Ambulatory Visit (INDEPENDENT_AMBULATORY_CARE_PROVIDER_SITE_OTHER): Payer: Medicare Other | Admitting: Family Medicine

## 2020-09-08 VITALS — BP 118/73 | HR 84 | Ht 66.5 in | Wt 231.6 lb

## 2020-09-08 DIAGNOSIS — N189 Chronic kidney disease, unspecified: Secondary | ICD-10-CM | POA: Diagnosis not present

## 2020-09-08 DIAGNOSIS — M542 Cervicalgia: Secondary | ICD-10-CM

## 2020-09-08 DIAGNOSIS — F419 Anxiety disorder, unspecified: Secondary | ICD-10-CM | POA: Diagnosis not present

## 2020-09-08 DIAGNOSIS — E063 Autoimmune thyroiditis: Secondary | ICD-10-CM | POA: Diagnosis not present

## 2020-09-08 MED ORDER — SAM-E 200 MG PO TABS: 200.0000 mg | ORAL_TABLET | Freq: Two times a day (BID) | ORAL | 6 refills | Status: DC

## 2020-09-08 NOTE — Patient Instructions (Signed)
     L-Theanine 100-200 mg two-three times daily
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
## 2020-09-09 ENCOUNTER — Encounter: Payer: Self-pay | Admitting: Physical Therapy

## 2020-09-10 ENCOUNTER — Encounter: Payer: Self-pay | Admitting: Family Medicine

## 2020-09-11 ENCOUNTER — Encounter: Payer: Self-pay | Admitting: Family Medicine

## 2020-09-11 LAB — CBC WITH DIFFERENTIAL/PLATELET
Absolute Monocytes: 497 cells/uL (ref 200–950)
Basophils Absolute: 62 cells/uL (ref 0–200)
Basophils Relative: 0.9 %
Eosinophils Absolute: 442 cells/uL (ref 15–500)
Eosinophils Relative: 6.4 %
HCT: 39.7 % (ref 35.0–45.0)
Hemoglobin: 13.3 g/dL (ref 11.7–15.5)
Lymphs Abs: 1656 cells/uL (ref 850–3900)
MCH: 30 pg (ref 27.0–33.0)
MCHC: 33.5 g/dL (ref 32.0–36.0)
MCV: 89.4 fL (ref 80.0–100.0)
MPV: 10 fL (ref 7.5–12.5)
Monocytes Relative: 7.2 %
Neutro Abs: 4244 cells/uL (ref 1500–7800)
Neutrophils Relative %: 61.5 %
Platelets: 158 10*3/uL (ref 140–400)
RBC: 4.44 10*6/uL (ref 3.80–5.10)
RDW: 13.1 % (ref 11.0–15.0)
Total Lymphocyte: 24 %
WBC: 6.9 10*3/uL (ref 3.8–10.8)

## 2020-09-11 LAB — TESTOSTERONE: Testosterone: 28 ng/dL

## 2020-09-11 LAB — IRON,TIBC AND FERRITIN PANEL
%SAT: 20 % (calc) (ref 16–45)
Ferritin: 58 ng/mL (ref 16–288)
Iron: 67 ug/dL (ref 45–160)
TIBC: 334 mcg/dL (calc) (ref 250–450)

## 2020-09-11 LAB — THYROID PANEL WITH TSH
Free Thyroxine Index: 3.2 (ref 1.4–3.8)
T3 Uptake: 29 % (ref 22–35)
T4, Total: 11.2 ug/dL (ref 5.1–11.9)
TSH: 3.78 mIU/L (ref 0.40–4.50)

## 2020-09-11 LAB — COPPER, SERUM: Copper: 159 ug/dL (ref 70–175)

## 2020-09-11 LAB — ZINC: Zinc: 74 ug/dL (ref 60–130)

## 2020-09-13 ENCOUNTER — Telehealth: Payer: Self-pay | Admitting: Family Medicine

## 2020-09-13 DIAGNOSIS — F419 Anxiety disorder, unspecified: Secondary | ICD-10-CM

## 2020-09-13 DIAGNOSIS — F32A Depression, unspecified: Secondary | ICD-10-CM

## 2020-09-13 NOTE — Telephone Encounter (Signed)
Labs are notable for the following:  Testosterone level is 28.  There is not a defined reference range for women, but this should be an acceptable number for you.  Copper level is 159 and zinc level is 74, giving a ratio of 2.14.  Ideal ratio is around 1.0-1.2.  You have a severe imbalance of these, and it could be contributing to your symptoms.   More info:  https://chriskresser.com/rhr-could-copper-zinc-imbalance-be-making-you-sick/    Start limiting these:  https://draxe.com/nutrition/foods-high-in-copper/  Be sure your supplements don't contain copper.  Start taking zinc at about 30 mg daily.   Recheck copper and zinc in about 4-6 months.  Other labs were normal.

## 2020-09-15 ENCOUNTER — Encounter: Payer: Self-pay | Admitting: Family Medicine

## 2020-09-16 ENCOUNTER — Encounter: Payer: Self-pay | Admitting: Family Medicine

## 2020-09-16 DIAGNOSIS — M25562 Pain in left knee: Secondary | ICD-10-CM

## 2020-09-16 DIAGNOSIS — E6609 Other obesity due to excess calories: Secondary | ICD-10-CM

## 2020-09-16 DIAGNOSIS — E66812 Obesity, class 2: Secondary | ICD-10-CM

## 2020-09-21 ENCOUNTER — Ambulatory Visit: Payer: Medicare Other | Admitting: Physical Therapy

## 2020-09-23 ENCOUNTER — Encounter: Payer: Self-pay | Admitting: Family Medicine

## 2020-09-29 NOTE — Telephone Encounter (Signed)
Spoke with patient and she asked to have a CD mailed to [facility]. Will go out 6/23.

## 2020-10-04 ENCOUNTER — Ambulatory Visit: Payer: Medicare Other | Admitting: Physical Therapy

## 2020-10-07 DIAGNOSIS — S338XXA Sprain of other parts of lumbar spine and pelvis, initial encounter: Secondary | ICD-10-CM | POA: Diagnosis not present

## 2020-10-07 DIAGNOSIS — M62830 Muscle spasm of back: Secondary | ICD-10-CM | POA: Diagnosis not present

## 2020-10-07 DIAGNOSIS — M9902 Segmental and somatic dysfunction of thoracic region: Secondary | ICD-10-CM | POA: Diagnosis not present

## 2020-10-07 DIAGNOSIS — M9901 Segmental and somatic dysfunction of cervical region: Secondary | ICD-10-CM | POA: Diagnosis not present

## 2020-10-14 DIAGNOSIS — S338XXA Sprain of other parts of lumbar spine and pelvis, initial encounter: Secondary | ICD-10-CM | POA: Diagnosis not present

## 2020-10-14 DIAGNOSIS — M9901 Segmental and somatic dysfunction of cervical region: Secondary | ICD-10-CM | POA: Diagnosis not present

## 2020-10-14 DIAGNOSIS — M62830 Muscle spasm of back: Secondary | ICD-10-CM | POA: Diagnosis not present

## 2020-10-14 DIAGNOSIS — M9902 Segmental and somatic dysfunction of thoracic region: Secondary | ICD-10-CM | POA: Diagnosis not present

## 2020-10-20 DIAGNOSIS — M9901 Segmental and somatic dysfunction of cervical region: Secondary | ICD-10-CM | POA: Diagnosis not present

## 2020-10-20 DIAGNOSIS — M62830 Muscle spasm of back: Secondary | ICD-10-CM | POA: Diagnosis not present

## 2020-10-20 DIAGNOSIS — S338XXA Sprain of other parts of lumbar spine and pelvis, initial encounter: Secondary | ICD-10-CM | POA: Diagnosis not present

## 2020-10-20 DIAGNOSIS — M9902 Segmental and somatic dysfunction of thoracic region: Secondary | ICD-10-CM | POA: Diagnosis not present

## 2020-10-22 ENCOUNTER — Other Ambulatory Visit: Payer: Self-pay | Admitting: Family Medicine

## 2020-10-22 DIAGNOSIS — Z780 Asymptomatic menopausal state: Secondary | ICD-10-CM

## 2020-10-22 NOTE — Progress Notes (Signed)
ex

## 2020-10-23 ENCOUNTER — Encounter: Payer: Self-pay | Admitting: Family Medicine

## 2020-10-23 DIAGNOSIS — E6609 Other obesity due to excess calories: Secondary | ICD-10-CM

## 2020-10-23 DIAGNOSIS — N1832 Chronic kidney disease, stage 3b: Secondary | ICD-10-CM

## 2020-10-23 DIAGNOSIS — Z6836 Body mass index (BMI) 36.0-36.9, adult: Secondary | ICD-10-CM

## 2020-10-23 DIAGNOSIS — E063 Autoimmune thyroiditis: Secondary | ICD-10-CM

## 2020-10-23 DIAGNOSIS — F419 Anxiety disorder, unspecified: Secondary | ICD-10-CM

## 2020-10-26 ENCOUNTER — Ambulatory Visit: Payer: Medicare Other | Admitting: Family Medicine

## 2020-10-26 ENCOUNTER — Ambulatory Visit: Payer: Medicare Other

## 2020-10-27 DIAGNOSIS — S338XXA Sprain of other parts of lumbar spine and pelvis, initial encounter: Secondary | ICD-10-CM | POA: Diagnosis not present

## 2020-10-27 DIAGNOSIS — M9901 Segmental and somatic dysfunction of cervical region: Secondary | ICD-10-CM | POA: Diagnosis not present

## 2020-10-27 DIAGNOSIS — M62830 Muscle spasm of back: Secondary | ICD-10-CM | POA: Diagnosis not present

## 2020-10-27 DIAGNOSIS — M9902 Segmental and somatic dysfunction of thoracic region: Secondary | ICD-10-CM | POA: Diagnosis not present

## 2020-11-02 ENCOUNTER — Encounter: Payer: Self-pay | Admitting: Family Medicine

## 2020-11-02 ENCOUNTER — Other Ambulatory Visit: Payer: Self-pay

## 2020-11-02 ENCOUNTER — Ambulatory Visit (INDEPENDENT_AMBULATORY_CARE_PROVIDER_SITE_OTHER): Payer: Medicare Other | Admitting: Family Medicine

## 2020-11-02 VITALS — BP 118/69 | HR 82 | Ht 66.5 in | Wt 221.8 lb

## 2020-11-02 DIAGNOSIS — Z6836 Body mass index (BMI) 36.0-36.9, adult: Secondary | ICD-10-CM

## 2020-11-02 DIAGNOSIS — N1832 Chronic kidney disease, stage 3b: Secondary | ICD-10-CM | POA: Diagnosis not present

## 2020-11-02 DIAGNOSIS — R7309 Other abnormal glucose: Secondary | ICD-10-CM | POA: Diagnosis not present

## 2020-11-02 DIAGNOSIS — N898 Other specified noninflammatory disorders of vagina: Secondary | ICD-10-CM

## 2020-11-02 DIAGNOSIS — E6609 Other obesity due to excess calories: Secondary | ICD-10-CM | POA: Diagnosis not present

## 2020-11-02 DIAGNOSIS — E063 Autoimmune thyroiditis: Secondary | ICD-10-CM

## 2020-11-02 DIAGNOSIS — F419 Anxiety disorder, unspecified: Secondary | ICD-10-CM

## 2020-11-02 DIAGNOSIS — Z780 Asymptomatic menopausal state: Secondary | ICD-10-CM

## 2020-11-02 DIAGNOSIS — Z8639 Personal history of other endocrine, nutritional and metabolic disease: Secondary | ICD-10-CM

## 2020-11-02 MED ORDER — LEVOTHYROXINE SODIUM 50 MCG PO TABS: 50.0000 ug | ORAL_TABLET | Freq: Every day | ORAL | 1 refills | Status: DC

## 2020-11-02 NOTE — Progress Notes (Addendum)
   Office Visit Note   Patient: Cynthia Cook           Date of Birth: [birth year]           MRN: [id number] Visit Date: 11/02/2020 Requested by: Eunice Blase, MD [address] [place],  [place] [address] PCP: Eunice Blase, MD  Subjective: Chief Complaint  Patient presents with   Other    Having issues with vaginal tissues "sticking together." Menopause 15 years ago. Interested in getting a cream for dryness or whatever is the cause of this. Would like some blood work today.    HPI: She is here for lab monitoring.  Overall she is doing pretty well.  From a thyroid standpoint she has been taking levothyroxine 50 mcg daily.  She has lost some weight recently but partly because she is not able to afford to eat how she wants to.  Blood pressure has been well controlled.  She has had some vaginal dryness symptoms this past week.  She has not had menopausal troubles for 15 years.  She is not sure what could be causing this.  She continues to have neck pain.  She is not able to afford physical therapy.  Bone density test has been ordered but there was no availability till January.                ROS:   All other systems were reviewed and are negative.  Objective: Vital Signs: BP 118/69 (BP Location: Left Arm, Patient Position: Sitting, Cuff Size: Large)   Pulse 82   Ht 5' 6.5" (1.689 m)   Wt 221 lb 12.8 oz (100.6 kg)   BMI 35.26 kg/m   Physical Exam:  General:  Alert and oriented, in no acute distress. Pulm:  Breathing unlabored. Psy:  Normal mood, congruent affect.  No exam done today.  Imaging: No results found.  Assessment & Plan: Hypothyroidism -Refilled levothyroxine.  Recheck labs in 3 to 6 months with new PCP.  2.  Chronic kidney disease - Recheck labs today.  3.  Vaginal dryness, reason uncertain. - Over-the-counter lubricant.  If symptoms persist, could contemplate topical prescription.       Procedures: No procedures performed        PMFS  History: Patient Active Problem List   Diagnosis Date Noted   Hashimoto's thyroiditis 06/29/2020   Syncope 02/27/2020   Elevated troponin I level    Atrial fibrillation ([facility]) 02/17/2020   Class 2 obesity due to excess calories without serious comorbidity with body mass index (BMI) of 36.0 to 36.9 in adult 02/17/2020   Hypothyroidism 02/06/2020   Pulmonary emboli ([facility]) 02/05/2020   ARF (acute renal failure) ([facility]) 02/05/2020   Past Medical History:  Diagnosis Date   Back pain    Murmur    PE (pulmonary embolism)    Persistent atrial fibrillation ([facility])    Syncope    Thyroid disease     Family History  Family history unknown: Yes    History reviewed. No pertinent surgical history. Social History   Occupational History   Not on file  Tobacco Use   Smoking status: Never   Smokeless tobacco: Never  Vaping Use   Vaping Use: Never used  Substance and Sexual Activity   Alcohol use: No   Drug use: Not on file   Sexual activity: Not on file

## 2020-11-03 ENCOUNTER — Telehealth: Payer: Self-pay | Admitting: Family Medicine

## 2020-11-03 DIAGNOSIS — M9902 Segmental and somatic dysfunction of thoracic region: Secondary | ICD-10-CM | POA: Diagnosis not present

## 2020-11-03 DIAGNOSIS — M62830 Muscle spasm of back: Secondary | ICD-10-CM | POA: Diagnosis not present

## 2020-11-03 DIAGNOSIS — N898 Other specified noninflammatory disorders of vagina: Secondary | ICD-10-CM

## 2020-11-03 DIAGNOSIS — M9901 Segmental and somatic dysfunction of cervical region: Secondary | ICD-10-CM | POA: Diagnosis not present

## 2020-11-03 DIAGNOSIS — Z780 Asymptomatic menopausal state: Secondary | ICD-10-CM

## 2020-11-03 DIAGNOSIS — S338XXA Sprain of other parts of lumbar spine and pelvis, initial encounter: Secondary | ICD-10-CM | POA: Diagnosis not present

## 2020-11-03 LAB — BASIC METABOLIC PANEL
BUN/Creatinine Ratio: 21 (calc) (ref 6–22)
BUN: 28 mg/dL — ABNORMAL HIGH (ref 7–25)
CO2: 28 mmol/L (ref 20–32)
Calcium: 9.9 mg/dL (ref 8.6–10.4)
Chloride: 105 mmol/L (ref 98–110)
Creat: 1.32 mg/dL — ABNORMAL HIGH (ref 0.50–1.05)
Glucose, Bld: 94 mg/dL (ref 65–99)
Potassium: 4.3 mmol/L (ref 3.5–5.3)
Sodium: 143 mmol/L (ref 135–146)

## 2020-11-03 LAB — VITAMIN D 25 HYDROXY (VIT D DEFICIENCY, FRACTURES): Vit D, 25-Hydroxy: 72 ng/mL (ref 30–100)

## 2020-11-03 LAB — HEMOGLOBIN A1C
Hgb A1c MFr Bld: 5.3 % of total Hgb (ref <5.7)
Mean Plasma Glucose: 105 mg/dL
eAG (mmol/L): 5.8 mmol/L

## 2020-11-03 LAB — T3, FREE: T3, Free: 2.9 pg/mL (ref 2.3–4.2)

## 2020-11-03 LAB — URIC ACID: Uric Acid, Serum: 10 mg/dL — ABNORMAL HIGH (ref 2.5–7.0)

## 2020-11-03 MED ORDER — ALLOPURINOL 100 MG PO TABS: 200.0000 mg | ORAL_TABLET | Freq: Every day | ORAL | 1 refills | Status: DC

## 2020-11-03 MED ORDER — FEBUXOSTAT 40 MG PO TABS: 40.0000 mg | ORAL_TABLET | Freq: Every day | ORAL | 3 refills | Status: DC

## 2020-11-03 NOTE — Addendum Note (Signed)
Addended by: Hortencia Pilar on: 11/03/2020 12:48 PM   Modules accepted: Orders

## 2020-11-03 NOTE — Addendum Note (Signed)
Addended by: Hortencia Pilar on: 11/03/2020 10:35 AM   Modules accepted: Orders

## 2020-11-03 NOTE — Telephone Encounter (Signed)
Labs are notable for the following:  Vitamin D looks perfect.  Uric acid level is too high at 10.0.  Target is below 7.  Would suggest increasing dosage to 200 mg daily and recheck in 3 months.  Kidney function is slightly higher than last time but still stable.  Recheck in 4 to 6 months.  A1C looks good.

## 2020-11-08 ENCOUNTER — Ambulatory Visit: Payer: Medicare Other

## 2020-11-08 ENCOUNTER — Telehealth: Payer: Self-pay

## 2020-11-08 NOTE — Telephone Encounter (Signed)
Sent message through [facility] -- asked patient to message me whenever she is feeling better and would like to have the labs drawn.

## 2020-11-08 NOTE — Telephone Encounter (Signed)
Pt called stating she isn't feeling well at all and can't make her appt today

## 2020-11-10 DIAGNOSIS — M9902 Segmental and somatic dysfunction of thoracic region: Secondary | ICD-10-CM | POA: Diagnosis not present

## 2020-11-10 DIAGNOSIS — S338XXA Sprain of other parts of lumbar spine and pelvis, initial encounter: Secondary | ICD-10-CM | POA: Diagnosis not present

## 2020-11-10 DIAGNOSIS — M9901 Segmental and somatic dysfunction of cervical region: Secondary | ICD-10-CM | POA: Diagnosis not present

## 2020-11-10 DIAGNOSIS — M62830 Muscle spasm of back: Secondary | ICD-10-CM | POA: Diagnosis not present

## 2020-11-19 ENCOUNTER — Other Ambulatory Visit: Payer: Self-pay

## 2020-11-19 ENCOUNTER — Ambulatory Visit (INDEPENDENT_AMBULATORY_CARE_PROVIDER_SITE_OTHER): Payer: Medicare Other

## 2020-11-19 DIAGNOSIS — N898 Other specified noninflammatory disorders of vagina: Secondary | ICD-10-CM

## 2020-11-19 DIAGNOSIS — Z780 Asymptomatic menopausal state: Secondary | ICD-10-CM

## 2020-11-19 NOTE — Progress Notes (Signed)
Labs per Dr. Junius Roads: DHEA, progesterone, estradiol and estrone.

## 2020-11-21 ENCOUNTER — Encounter: Payer: Self-pay | Admitting: Family Medicine

## 2020-11-23 ENCOUNTER — Telehealth: Payer: Self-pay | Admitting: Family Medicine

## 2020-11-23 NOTE — Telephone Encounter (Signed)
Estradiol and progesterone are in postmenopausal range.

## 2020-11-25 ENCOUNTER — Telehealth: Payer: Self-pay | Admitting: Family Medicine

## 2020-11-25 ENCOUNTER — Encounter: Payer: Self-pay | Admitting: Family Medicine

## 2020-11-25 LAB — TEST AUTHORIZATION

## 2020-11-25 LAB — PROGESTERONE: Progesterone: <0.5 ng/mL

## 2020-11-25 LAB — DHEA

## 2020-11-25 LAB — ESTRONE: Factor VIII: C, Activity: 27 pg/mL

## 2020-11-25 LAB — ESTRADIOL: Estradiol: <15 pg/mL

## 2020-11-25 LAB — DHEA-SULFATE: DHEA-SO4: 91 ug/dL (ref 9–118)

## 2020-11-25 NOTE — Telephone Encounter (Signed)
DHEA-S level looks normal.

## 2020-12-23 ENCOUNTER — Other Ambulatory Visit: Payer: Self-pay

## 2020-12-23 ENCOUNTER — Encounter: Payer: Self-pay | Admitting: Orthopedic Surgery

## 2020-12-23 ENCOUNTER — Ambulatory Visit: Payer: Self-pay

## 2020-12-23 ENCOUNTER — Ambulatory Visit: Payer: Medicare Other | Admitting: Orthopedic Surgery

## 2020-12-23 VITALS — BP 130/82 | HR 85

## 2020-12-23 DIAGNOSIS — M25531 Pain in right wrist: Secondary | ICD-10-CM

## 2020-12-23 LAB — CBC WITH DIFFERENTIAL/PLATELET
Absolute Monocytes: 503 cells/uL (ref 200–950)
Basophils Absolute: 47 cells/uL (ref 0–200)
Basophils Relative: 0.7 %
Eosinophils Absolute: 382 cells/uL (ref 15–500)
Eosinophils Relative: 5.7 %
HCT: 38.8 % (ref 35.0–45.0)
Hemoglobin: 13 g/dL (ref 11.7–15.5)
Lymphs Abs: 1213 cells/uL (ref 850–3900)
MCH: 29 pg (ref 27.0–33.0)
MCHC: 33.5 g/dL (ref 32.0–36.0)
MCV: 86.6 fL (ref 80.0–100.0)
MPV: 10.1 fL (ref 7.5–12.5)
Monocytes Relative: 7.5 %
Neutro Abs: 4556 cells/uL (ref 1500–7800)
Neutrophils Relative %: 68 %
Platelets: 170 10*3/uL (ref 140–400)
RBC: 4.48 10*6/uL (ref 3.80–5.10)
RDW: 12.7 % (ref 11.0–15.0)
Total Lymphocyte: 18.1 %
WBC: 6.7 10*3/uL (ref 3.8–10.8)

## 2020-12-23 MED ORDER — PREDNISONE 10 MG PO TABS: 15.0000 mg | ORAL_TABLET | Freq: Two times a day (BID) | ORAL | 0 refills | Status: AC

## 2020-12-24 ENCOUNTER — Telehealth: Payer: Self-pay | Admitting: Orthopedic Surgery

## 2020-12-24 NOTE — Telephone Encounter (Signed)
Please see below.

## 2020-12-24 NOTE — Telephone Encounter (Signed)
Patient called said she feel 80% better. Patient said she wanted to thank Dr. Tempie Donning for  caring for her. The number to contact patient if needed is    [phone]

## 2020-12-24 NOTE — Progress Notes (Signed)
Office Visit Note   Patient: Cynthia Cook           Date of Birth: Oct 08, 1955           MRN: [id number] Visit Date: 12/23/2020              Requested by: Eunice Blase, MD No address on file PCP: Eunice Blase, MD   Assessment & Plan: Visit Diagnoses:  1. Pain in right wrist     Plan: Discussed with patient that the appearance of her wrist and her symptoms in the setting of a history of gout suggest an acute gout flare of her wrist.  She has no recent infections or routes of inoculation to suggest infection.  Ultrasound of her right wrist did not demonstrate any significant effusion.  At this point we will start her on a course of prednisone as this previously worked for her gout.  We will also draw some labs today including an ESR/CRP, white count with differential, uric acid level.  I will follow-up those labs and will call if they are abnormal.  We have also given her a removable brace for pain relief.  She will call the office in the next 24 to 48 hours if she is not improving.  Follow-Up Instructions: No follow-ups on file.   Orders:  Orders Placed This Encounter  Procedures   XR Wrist Complete Right   Sed Rate (ESR)   C-reactive protein   Uric acid   Rheumatoid Factor   Antinuclear Antib (ANA)   CBC with Differential   Meds ordered this encounter  Medications   predniSONE (DELTASONE) 10 MG tablet    Sig: Take 1.5 tablets (15 mg total) by mouth 2 (two) times daily with a meal for 7 days.    Dispense:  21 tablet    Refill:  0      Procedures: No procedures performed   Clinical Data: No additional findings.   Subjective: Chief Complaint  Patient presents with   Right Wrist - New Patient (Initial Visit)    This is a 65 year old right-hand-dominant female who presents with right wrist pain starting 2 days ago.  She denies any fall or injury to this wrist.  Her pain is diffuse, poorly localized, and 8 out of 10 in severity.  It is centered over the dorsal  aspect of the wrist.  It is worse with attempted range of motion.  She does have a history of gout but has never had any attacks in the wrist.  She is most had symptoms involving her feet and ankles.  She was previously on allopurinol but stopped 5 weeks ago.  She denies fevers, chills, or other systemic symptoms.   Review of Systems  Constitutional: Negative.   Respiratory: Negative.    Cardiovascular: Negative.   Musculoskeletal:  Positive for joint swelling.  Skin: Negative.     Objective: Vital Signs: BP 130/82 (BP Location: Right Arm, Patient Position: Sitting, Cuff Size: Normal)   Pulse 85   SpO2 96%   Physical Exam Constitutional:      Appearance: Normal appearance.  Cardiovascular:     Rate and Rhythm: Normal rate.     Pulses: Normal pulses.  Pulmonary:     Effort: Pulmonary effort is normal.  Musculoskeletal:        General: Swelling present.  Skin:    General: Skin is warm and dry.  Neurological:     General: No focal deficit present.     Mental Status: She  is alert.    Right Hand Exam   Tenderness  Right hand tenderness location: Severely TTP over dorsal aspect of wrist w/ moderate swelling.  Other  Erythema: absent Sensation: normal Pulse: present  Comments:  Severe pain w/ any attempted ROM of wrist.  Moderate swelling but without erythema.  No cuts, scrapes, or other breaks in the skin.      Specialty Comments:  No specialty comments available.  Imaging: 3 views of the right wrist taken yesterday are reviewed interpreted by me.  There is no obvious acute bony injury.  Bedside ultrasound performed by me on the right wrist did not demonstrate any significant radiocarpal effusion.   PMFS History: Patient Active Problem List   Diagnosis Date Noted   Hashimoto's thyroiditis 06/29/2020   Syncope 02/27/2020   Elevated troponin I level    Atrial fibrillation ([facility]) 02/17/2020   Class 2 obesity due to excess calories without serious comorbidity with  body mass index (BMI) of 36.0 to 36.9 in adult 02/17/2020   Hypothyroidism 02/06/2020   Pulmonary emboli ([facility]) 02/05/2020   ARF (acute renal failure) ([facility]) 02/05/2020   Past Medical History:  Diagnosis Date   Back pain    Murmur    PE (pulmonary embolism)    Persistent atrial fibrillation ([facility])    Syncope    Thyroid disease     Family History  Family history unknown: Yes    History reviewed. No pertinent surgical history. Social History   Occupational History   Not on file  Tobacco Use   Smoking status: Never   Smokeless tobacco: Never  Vaping Use   Vaping Use: Never used  Substance and Sexual Activity   Alcohol use: No   Drug use: Not on file   Sexual activity: Not on file

## 2020-12-25 LAB — ANA: Anti Nuclear Antibody (ANA): NEGATIVE

## 2020-12-25 LAB — URIC ACID: Uric Acid, Serum: 9.2 mg/dL — ABNORMAL HIGH (ref 2.5–7.0)

## 2020-12-25 LAB — SEDIMENTATION RATE: Sed Rate: 19 mm/h (ref 0–30)

## 2020-12-25 LAB — RHEUMATOID FACTOR: Rheumatoid fact SerPl-aCnc: <14 IU/mL (ref <14)

## 2020-12-25 LAB — C-REACTIVE PROTEIN: CRP: 15.3 mg/L — ABNORMAL HIGH (ref <8.0)

## 2021-01-03 ENCOUNTER — Observation Stay (HOSPITAL_COMMUNITY): Payer: Medicare Other

## 2021-01-03 ENCOUNTER — Encounter (HOSPITAL_BASED_OUTPATIENT_CLINIC_OR_DEPARTMENT_OTHER): Payer: Self-pay | Admitting: *Deleted

## 2021-01-03 ENCOUNTER — Emergency Department (HOSPITAL_BASED_OUTPATIENT_CLINIC_OR_DEPARTMENT_OTHER): Payer: Medicare Other

## 2021-01-03 ENCOUNTER — Other Ambulatory Visit: Payer: Self-pay

## 2021-01-03 ENCOUNTER — Inpatient Hospital Stay (HOSPITAL_BASED_OUTPATIENT_CLINIC_OR_DEPARTMENT_OTHER)
Admission: EM | Admit: 2021-01-03 | Discharge: 2021-01-05 | DRG: 065 | Disposition: A | Discharge status: Home Health | Payer: Medicare Other | Attending: Internal Medicine | Admitting: Internal Medicine

## 2021-01-03 DIAGNOSIS — M549 Dorsalgia, unspecified: Secondary | ICD-10-CM | POA: Diagnosis present

## 2021-01-03 DIAGNOSIS — Y92009 Unspecified place in unspecified non-institutional (private) residence as the place of occurrence of the external cause: Secondary | ICD-10-CM

## 2021-01-03 DIAGNOSIS — I6381 Other cerebral infarction due to occlusion or stenosis of small artery: Principal | ICD-10-CM | POA: Diagnosis present

## 2021-01-03 DIAGNOSIS — Z885 Allergy status to narcotic agent status: Secondary | ICD-10-CM | POA: Diagnosis not present

## 2021-01-03 DIAGNOSIS — E6609 Other obesity due to excess calories: Secondary | ICD-10-CM | POA: Diagnosis not present

## 2021-01-03 DIAGNOSIS — R471 Dysarthria and anarthria: Secondary | ICD-10-CM | POA: Diagnosis present

## 2021-01-03 DIAGNOSIS — Z20822 Contact with and (suspected) exposure to covid-19: Secondary | ICD-10-CM | POA: Diagnosis present

## 2021-01-03 DIAGNOSIS — Z79890 Hormone replacement therapy (postmenopausal): Secondary | ICD-10-CM

## 2021-01-03 DIAGNOSIS — I080 Rheumatic disorders of both mitral and aortic valves: Secondary | ICD-10-CM | POA: Diagnosis present

## 2021-01-03 DIAGNOSIS — R29810 Facial weakness: Secondary | ICD-10-CM | POA: Diagnosis present

## 2021-01-03 DIAGNOSIS — E063 Autoimmune thyroiditis: Secondary | ICD-10-CM

## 2021-01-03 DIAGNOSIS — T45526A Underdosing of antithrombotic drugs, initial encounter: Secondary | ICD-10-CM | POA: Diagnosis not present

## 2021-01-03 DIAGNOSIS — Z91120 Patient's intentional underdosing of medication regimen due to financial hardship: Secondary | ICD-10-CM | POA: Diagnosis not present

## 2021-01-03 DIAGNOSIS — G8929 Other chronic pain: Secondary | ICD-10-CM | POA: Diagnosis present

## 2021-01-03 DIAGNOSIS — R4781 Slurred speech: Secondary | ICD-10-CM | POA: Diagnosis present

## 2021-01-03 DIAGNOSIS — Z79899 Other long term (current) drug therapy: Secondary | ICD-10-CM | POA: Diagnosis not present

## 2021-01-03 DIAGNOSIS — N1831 Chronic kidney disease, stage 3a: Secondary | ICD-10-CM | POA: Diagnosis present

## 2021-01-03 DIAGNOSIS — R4701 Aphasia: Secondary | ICD-10-CM | POA: Diagnosis not present

## 2021-01-03 DIAGNOSIS — E039 Hypothyroidism, unspecified: Secondary | ICD-10-CM | POA: Diagnosis present

## 2021-01-03 DIAGNOSIS — E1122 Type 2 diabetes mellitus with diabetic chronic kidney disease: Secondary | ICD-10-CM | POA: Diagnosis present

## 2021-01-03 DIAGNOSIS — N183 Chronic kidney disease, stage 3 unspecified: Secondary | ICD-10-CM

## 2021-01-03 DIAGNOSIS — E785 Hyperlipidemia, unspecified: Secondary | ICD-10-CM | POA: Diagnosis present

## 2021-01-03 DIAGNOSIS — Z86711 Personal history of pulmonary embolism: Secondary | ICD-10-CM | POA: Diagnosis not present

## 2021-01-03 DIAGNOSIS — I639 Cerebral infarction, unspecified: Secondary | ICD-10-CM | POA: Diagnosis not present

## 2021-01-03 DIAGNOSIS — E038 Other specified hypothyroidism: Secondary | ICD-10-CM

## 2021-01-03 DIAGNOSIS — Z6841 Body Mass Index (BMI) 40.0 and over, adult: Secondary | ICD-10-CM | POA: Diagnosis not present

## 2021-01-03 DIAGNOSIS — I480 Paroxysmal atrial fibrillation: Secondary | ICD-10-CM | POA: Diagnosis not present

## 2021-01-03 DIAGNOSIS — M109 Gout, unspecified: Secondary | ICD-10-CM | POA: Diagnosis not present

## 2021-01-03 DIAGNOSIS — I4891 Unspecified atrial fibrillation: Secondary | ICD-10-CM | POA: Diagnosis present

## 2021-01-03 DIAGNOSIS — Z6836 Body mass index (BMI) 36.0-36.9, adult: Secondary | ICD-10-CM

## 2021-01-03 HISTORY — DX: Gout, unspecified: M10.9

## 2021-01-03 LAB — CBC WITH DIFFERENTIAL/PLATELET
Abs Immature Granulocytes: 0.01 10*3/uL (ref 0.00–0.07)
Basophils Absolute: 0 10*3/uL (ref 0.0–0.1)
Basophils Relative: 1 %
Eosinophils Absolute: 0.4 10*3/uL (ref 0.0–0.5)
Eosinophils Relative: 6 %
HCT: 37.2 % (ref 36.0–46.0)
Hemoglobin: 12.1 g/dL (ref 12.0–15.0)
Immature Granulocytes: 0 %
Lymphocytes Relative: 18 %
Lymphs Abs: 1.3 10*3/uL (ref 0.7–4.0)
MCH: 28.8 pg (ref 26.0–34.0)
MCHC: 32.5 g/dL (ref 30.0–36.0)
MCV: 88.6 fL (ref 80.0–100.0)
Monocytes Absolute: 0.6 10*3/uL (ref 0.1–1.0)
Monocytes Relative: 9 %
Neutro Abs: 4.5 10*3/uL (ref 1.7–7.7)
Neutrophils Relative %: 66 %
Platelets: 157 10*3/uL (ref 150–400)
RBC: 4.2 MIL/uL (ref 3.87–5.11)
RDW: 13 % (ref 11.5–15.5)
WBC: 6.8 10*3/uL (ref 4.0–10.5)
nRBC: 0 % (ref 0.0–0.2)

## 2021-01-03 LAB — BASIC METABOLIC PANEL
Anion gap: 8 (ref 5–15)
BUN: 35 mg/dL — ABNORMAL HIGH (ref 8–23)
CO2: 30 mmol/L (ref 22–32)
Calcium: 9.6 mg/dL (ref 8.9–10.3)
Chloride: 105 mmol/L (ref 98–111)
Creatinine, Ser: 1.26 mg/dL — ABNORMAL HIGH (ref 0.44–1.00)
GFR, Estimated: 47 mL/min — ABNORMAL LOW (ref >60)
Glucose, Bld: 116 mg/dL — ABNORMAL HIGH (ref 70–99)
Potassium: 4.3 mmol/L (ref 3.5–5.1)
Sodium: 143 mmol/L (ref 135–145)

## 2021-01-03 LAB — RESP PANEL BY RT-PCR (FLU A&B, COVID) ARPGX2
Influenza A by PCR: NEGATIVE
Influenza B by PCR: NEGATIVE
SARS Coronavirus 2 by RT PCR: NEGATIVE

## 2021-01-03 MED ORDER — LORAZEPAM 1 MG PO TABS
1.0000 mg | ORAL_TABLET | Freq: Once | ORAL | Status: AC
  Administered 2021-01-03: 1 mg via ORAL
  Filled 2021-01-03: qty 1

## 2021-01-03 MED ORDER — ASPIRIN 81 MG PO CHEW
162.0000 mg | CHEWABLE_TABLET | Freq: Once | ORAL | Status: AC
  Administered 2021-01-03: 162 mg via ORAL
  Filled 2021-01-03: qty 2

## 2021-01-03 MED ORDER — CLOPIDOGREL BISULFATE 75 MG PO TABS
75.0000 mg | ORAL_TABLET | Freq: Every day | ORAL | Status: DC
  Administered 2021-01-04 – 2021-01-05 (×2): 75 mg via ORAL
  Filled 2021-01-03 (×2): qty 1

## 2021-01-03 MED ORDER — ASPIRIN 325 MG PO TABS
325.0000 mg | ORAL_TABLET | Freq: Every day | ORAL | Status: DC
  Administered 2021-01-04 – 2021-01-05 (×2): 325 mg via ORAL
  Filled 2021-01-03 (×3): qty 1

## 2021-01-03 MED ORDER — ACETAMINOPHEN 325 MG PO TABS
650.0000 mg | ORAL_TABLET | Freq: Four times a day (QID) | ORAL | Status: DC | PRN
  Filled 2021-01-03: qty 2

## 2021-01-03 MED ORDER — ACETAMINOPHEN 650 MG RE SUPP: 650.0000 mg | Freq: Four times a day (QID) | RECTAL | Status: DC | PRN

## 2021-01-03 MED ORDER — ALLOPURINOL 100 MG PO TABS
100.0000 mg | ORAL_TABLET | Freq: Every day | ORAL | Status: DC
  Administered 2021-01-03: 100 mg via ORAL
  Filled 2021-01-03 (×2): qty 1

## 2021-01-03 MED ORDER — ONDANSETRON HCL 4 MG/2ML IJ SOLN: 4.0000 mg | Freq: Four times a day (QID) | INTRAMUSCULAR | Status: DC | PRN

## 2021-01-03 MED ORDER — MELATONIN 5 MG PO TABS
5.0000 mg | ORAL_TABLET | Freq: Every evening | ORAL | Status: DC | PRN
  Administered 2021-01-03: 5 mg via ORAL
  Filled 2021-01-03: qty 1

## 2021-01-03 MED ORDER — LEVOTHYROXINE SODIUM 50 MCG PO TABS
50.0000 ug | ORAL_TABLET | Freq: Every day | ORAL | Status: DC
  Administered 2021-01-04 – 2021-01-05 (×2): 50 ug via ORAL
  Filled 2021-01-03 (×2): qty 1

## 2021-01-03 MED ORDER — ONDANSETRON HCL 4 MG PO TABS: 4.0000 mg | ORAL_TABLET | Freq: Four times a day (QID) | ORAL | Status: DC | PRN

## 2021-01-03 MED ORDER — HEPARIN SODIUM (PORCINE) 5000 UNIT/ML IJ SOLN
5000.0000 [IU] | Freq: Three times a day (TID) | INTRAMUSCULAR | Status: DC
  Administered 2021-01-03 – 2021-01-05 (×5): 5000 [IU] via SUBCUTANEOUS
  Filled 2021-01-03 (×5): qty 1

## 2021-01-03 MED ORDER — CLOPIDOGREL BISULFATE 75 MG PO TABS
75.0000 mg | ORAL_TABLET | Freq: Once | ORAL | Status: AC
  Administered 2021-01-03: 75 mg via ORAL
  Filled 2021-01-03: qty 1

## 2021-01-03 NOTE — Assessment & Plan Note (Signed)
Chronic. 

## 2021-01-03 NOTE — ED Notes (Signed)
Pt is still refusing transportation via [facility]

## 2021-01-03 NOTE — Assessment & Plan Note (Addendum)
Admit to med/surg telemetry bed. Observation. Check MRI/MRA brain, carotid dopplers, bubble echo. Continue with ASA. Discussed with neurology. Hold on plavix. Stroke team will determine when to start [place]. Pt with hx of GI bleeding while on coumadin many years ago. Check lipid panel. SQ heparin for DVT prophylaxis.

## 2021-01-03 NOTE — Assessment & Plan Note (Signed)
Stable Scr. 

## 2021-01-03 NOTE — Assessment & Plan Note (Signed)
Check TSH 

## 2021-01-03 NOTE — Progress Notes (Signed)
Brief Neurology Telephone Note  Contacted by Dr. Thamas Jaegers about this 65 yo patient who presented to [facility] c/o slurred speech and R facial droop ongoing for past 3 days. Other pmhx incl prior hx a fib s/p ablation no longer on anticoagulation. Head CT NAICP. She is well outside the window for thrombolytics or intervention at this point. She is being admitted to [facility] for stroke workup.  Interim recommendations:: Agree with transfer to [facility] hospitalist service; notify [facility] neurohospitalist upon patient's arrival ASA 325mg  now + plavix 75mg  now Permissive HTN up to 220 until neurohospitalist can confirm [facility] Other workup and mgmt per [place], MD [facility] [phone]  If 7pm- 7am, please page neurology on call as listed in [facility].

## 2021-01-03 NOTE — ED Notes (Signed)
Patient said that she does not want to be transported via ambulance because it is very expensive.  EDP is aware.  Family friend is going to drive her to the hospital.

## 2021-01-03 NOTE — ED Provider Notes (Addendum)
[facility] EMERGENCY DEPT Provider Note   CSN: [id number] Arrival date & time: 01/03/21  0856     History Chief Complaint  Patient presents with   Aphasia    Cynthia Cook is a 65 y.o. female.  Patient presents to ER chief complaint of slurred speech right-sided facial droop.  Symptoms started 3 days ago in the morning.  They have persisted until today.  When they did not improve, she asked one of her friends to help bring her to the ER.  She denies any fall or trauma.  No reports of pain.  No headache no chest pain abdominal pain.  No fever no cough no vomiting no diarrhea.  She states she has a history of atrial fibrillation but was cardioverted, no longer on any anticoagulation.      Past Medical History:  Diagnosis Date   Back pain    Gout    Murmur    PE (pulmonary embolism)    Persistent atrial fibrillation (HCC)    Syncope    Thyroid disease     Patient Active Problem List   Diagnosis Date Noted   Hashimoto's thyroiditis 06/29/2020   Syncope 02/27/2020   Elevated troponin I level    Atrial fibrillation ([place]) 02/17/2020   Class 2 obesity due to excess calories without serious comorbidity with body mass index (BMI) of 36.0 to 36.9 in adult 02/17/2020   Hypothyroidism 02/06/2020   Pulmonary emboli ([place]) 02/05/2020   ARF (acute renal failure) ([place]) 02/05/2020    History reviewed. No pertinent surgical history.   OB History   No obstetric history on file.     Family History  Family history unknown: Yes    Social History   Tobacco Use   Smoking status: Never   Smokeless tobacco: Never  Vaping Use   Vaping Use: Never used  Substance Use Topics   Alcohol use: No   Drug use: Never    Home Medications Prior to Admission medications   Medication Sig Start Date End Date Taking? Authorizing Provider  allopurinol (ZYLOPRIM) 100 MG tablet Take 2 tablets (200 mg total) by mouth daily. 11/03/20   Hilts, Legrand Como, MD  ascorbic acid (VITAMIN C)  500 MG tablet Take 500 mg by mouth daily.    [provider]  febuxostat (ULORIC) 40 MG tablet Take 1 tablet (40 mg total) by mouth daily. 11/03/20   Hilts, Legrand Como, MD  levothyroxine (SYNTHROID) 50 MCG tablet Take 1 tablet (50 mcg total) by mouth daily before breakfast. 11/02/20   Hilts, Legrand Como, MD  NATTOKINASE PO Take by mouth daily.    [provider]  S-Adenosylmethionine (SAM-E) 200 MG TABS Take 1 tablet (200 mg total) by mouth in the morning and at bedtime. 09/08/20   Hilts, Legrand Como, MD  triamcinolone cream (KENALOG) 0.1 % Apply 1 application topically daily as needed (rash).  02/09/20   [provider]  Vitamin D-Vitamin K (VITAMIN K2-VITAMIN D3 PO) Take 1 tablet by mouth daily.     [provider]  vitamin E 180 MG (400 UNITS) capsule Take 400 Units by mouth daily.    [provider]    Allergies    Codeine and Fentanyl  Review of Systems   Review of Systems  Constitutional:  Negative for fever.  HENT:  Negative for ear pain.   Eyes:  Negative for pain.  Respiratory:  Negative for cough.   Cardiovascular:  Negative for chest pain.  Gastrointestinal:  Negative for abdominal pain.  Genitourinary:  Negative for flank pain.  Musculoskeletal:  Negative for back pain.  Skin:  Negative for rash.  Neurological:  Negative for headaches.   Physical Exam Updated Vital Signs BP (!) 167/83 (BP Location: Right Arm)   Pulse 86   Temp 98.2 F (36.8 C) (Oral)   Resp 19   Ht 5\' 6"  (1.676 m)   Wt 113.4 kg   SpO2 96%   BMI 40.35 kg/m   Physical Exam Constitutional:      General: She is not in acute distress.    Appearance: Normal appearance.  HENT:     Head: Normocephalic.     Nose: Nose normal.  Eyes:     Extraocular Movements: Extraocular movements intact.  Cardiovascular:     Rate and Rhythm: Normal rate.  Pulmonary:     Effort: Pulmonary effort is normal.  Musculoskeletal:        General: Normal range of motion.     Cervical back:  Normal range of motion.  Neurological:     Mental Status: She is alert and oriented to person, place, and time. Mental status is at baseline.     Comments: Patient has right-sided facial droop, slurred speech.  Otherwise 5/5 strength bilateral upper and lower extremities.    ED Results / Procedures / Treatments   Labs (all labs ordered are listed, but only abnormal results are displayed) Labs Reviewed  BASIC METABOLIC PANEL - Abnormal; Notable for the following components:      Result Value   Glucose, Bld 116 (*)    BUN 35 (*)    Creatinine, Ser 1.26 (*)    GFR, Estimated 47 (*)    All other components within normal limits  RESP PANEL BY RT-PCR (FLU A&B, COVID) ARPGX2  CBC WITH DIFFERENTIAL/PLATELET    EKG EKG Interpretation  Date/Time:  Monday January 03 2021 09:13:02 EDT Ventricular Rate:  89 PR Interval:  151 QRS Duration: 105 QT Interval:  416 QTC Calculation: 507 R Axis:   60 Text Interpretation: Sinus rhythm Borderline repol abnormality, diffuse leads Prolonged QT interval Confirmed by Thamas Jaegers ([id number]) on 01/03/2021 9:36:51 AM  Radiology CT HEAD WO CONTRAST (5MM)  Result Date: 01/03/2021 CLINICAL DATA:  65 year old female with abnormal speech since yesterday. EXAM: CT HEAD WITHOUT CONTRAST TECHNIQUE: Contiguous axial images were obtained from the base of the skull through the vertex without intravenous contrast. COMPARISON:  Head CT 02/05/2020. FINDINGS: Brain: Stable cerebral volume. No midline shift, ventriculomegaly, mass effect, evidence of mass lesion, acute intracranial hemorrhage. Patchy bilateral cerebral white matter hypodensity. New focal hypodensity at the posterior left external capsule since last year on series 2, image 14. And left superior frontal lobe subcortical and possibly cortical encephalomalacia is new or progressed since last year on coronal image 34. Small chronic right cerebellar infarct is stable. But there are new small hypodense infarcts in  the left superior cerebellum on series 4, image 19. ASPECTS 9 (abnormal left M5). Vascular: Extensive Calcified atherosclerosis at the skull base. Skull: Stable, negative. Sinuses/Orbits: Visualized paranasal sinuses and mastoids are stable and well aerated. Other: Visualized orbits and scalp soft tissues are within normal limits. IMPRESSION: 1. Progression of small to medium-sized vessel ischemic changes in both the Left MCA and Left superior cerebellar artery territories since last year. Hypodensity in those areas is age indeterminate. 2. Underlying chronic small vessel disease. No acute intracranial hemorrhage or mass effect. Electronically Signed   By: Genevie Ann M.D.   On: 01/03/2021 09:54    Procedures  Procedures   Medications Ordered in ED Medications  aspirin chewable tablet 162 mg (has no administration in time range)  clopidogrel (PLAVIX) tablet 75 mg (has no administration in time range)  aspirin chewable tablet 162 mg (162 mg Oral Given 01/03/21 1033)    ED Course  I have reviewed the triage vital signs and the nursing notes.  Pertinent labs & imaging results that were available during my care of the patient were reviewed by me and considered in my medical decision making (see chart for details).    MDM Rules/Calculators/A&P                           Concern for acute stroke.  Patient not a candidate for tPA given symptoms of been going on for 2 to 3 days.  Lab tests are unremarkable.  Imaging shows age-indeterminate new strokes compared to prior CT about a year ago per radiology.  Patient will be transferred to hospital for further work-up for acute stroke.  Case discussed with on-call neurology Dr.Stack, recommending Plavix and aspirin.  Patient also states that they do not want to be transferred to the hospital but they will leave a private auto and go to the hospital directly.  Risks and benefits discussed, I strongly recommended hospital transportation by ambulance patient  refused nevertheless.  Has decision-making capacity, declining transportation.  Final Clinical Impression(s) / ED Diagnoses Final diagnoses:  Cerebrovascular accident (CVA), unspecified mechanism [facility])    Rx / [place] Orders ED Discharge Orders     None        Luna Fuse, MD 01/03/21 1016    Luna Fuse, MD 01/03/21 1044

## 2021-01-03 NOTE — ED Notes (Signed)
Patient agreeing to [facility] transfer versus POV

## 2021-01-03 NOTE — ED Triage Notes (Addendum)
Pt speech normal on Sat, noticed slurred speech on Sunday. PT REFUSING TO TAKE CLOTHES OFF WHEN ASKED TWICE. Pt also difficult to assess with [employer] due to not answering questions. No noted deficits other than speech.

## 2021-01-03 NOTE — Assessment & Plan Note (Signed)
Pt has not been taking any antiplatelet or systemic anticoagulants prior to admission. Currently in NSR.

## 2021-01-03 NOTE — ED Notes (Signed)
Spoke with Tiffany from [place] and given report

## 2021-01-03 NOTE — Consult Note (Signed)
NEUROLOGY CONSULTATION NOTE   Date of service: January 03, 2021 Patient Name: Cynthia Cook MRN:  [id number] DOB:  Oct 21, 1955 Reason for consult: "R facial droop and slurred speech" Requesting Provider: Kristopher Oppenheim, DO _ _ _   _ __   _ __ _ _  __ __   _ __   __ _  History of Present Illness  Cynthia Cook is a 65 y.o. female with PMH significant for Afibb(unable to afford eliquis and urethral and nose bleed on warfarin in the past), hx of PE, hypothyroidism, gout, hx of orthostatic syncope who presented to [facility] ED with chief complaint of R facial droop and slurred speech. Started on 01/01/21. symptoms were sudden onset and persistent since onset. Speech was slurry while talking to her friend over phone. No prior hx of similar symptoms. No improvement in symptoms. No word finding difficulty.  She was recommended anticoagulation but was unable to afford the cost of eliquis and unfortunately stopped taking eliquis about a year ago. She reports bleeding from urethra and her nose when she was put on on warfarin in the past.  She does not smoke, no etOH intake. Has no family and lives by herself with her dog.  LKW: 01/01/21, sometime afternoon tPA/thrombectomy: outside window NIHSS components Score: Comment  1a Level of Conscious 0[x]  1[]  2[]  3[]      1b LOC Questions 0[x]  1[]  2[]       1c LOC Commands 0[x]  1[]  2[]       2 Best Gaze 0[x]  1[]  2[]       3 Visual 0[x]  1[]  2[]  3[]      4 Facial Palsy 0[]  1[x]  2[]  3[]      5a Motor Arm - left 0[x]  1[]  2[]  3[]  4[]  UN[]    5b Motor Arm - Right 0[x]  1[]  2[]  3[]  4[]  UN[]    6a Motor Leg - Left 0[x]  1[]  2[]  3[]  4[]  UN[]    6b Motor Leg - Right 0[x]  1[]  2[]  3[]  4[]  UN[]    7 Limb Ataxia 0[x]  1[]  2[]  3[]  UN[]     8 Sensory 0[x]  1[]  2[]  UN[]      9 Best Language 0[x]  1[]  2[]  3[]      10 Dysarthria 0[]  1[]  2[x]  UN[]      11 Extinct. and Inattention 0[x]  1[]  2[]       TOTAL: 3      ROS   Constitutional Denies weight loss, fever and chills.    HEENT Denies changes in vision and hearing.   Respiratory Denies SOB and cough.   CV Denies palpitations and CP   GI Denies abdominal pain, nausea, vomiting and diarrhea.   GU Denies dysuria and urinary frequency.   MSK Denies myalgia and joint pain.   Skin Denies rash and pruritus.   Neurological Denies headache and syncope.   Psychiatric Denies recent changes in mood. Denies anxiety and depression.    Past History   Past Medical History:  Diagnosis Date   Back pain    Gout    Murmur    PE (pulmonary embolism)    Persistent atrial fibrillation (HCC)    Pulmonary emboli (HCC) 02/05/2020   Syncope    Thyroid disease    History reviewed. No pertinent surgical history. Family History  Family history unknown: Yes   Social History   Socioeconomic History   Marital status: Divorced    Spouse name: Not on file   Number of children: Not on file   Years of education: Not on file   Highest education level: Not  on file  Occupational History    Comment: retired Therapist, sports  Tobacco Use   Smoking status: Never   Smokeless tobacco: Never  Vaping Use   Vaping Use: Never used  Substance and Sexual Activity   Alcohol use: No   Drug use: Never   Sexual activity: Not on file  Other Topics Concern   Not on file  Social History Narrative   Lives with her dog, she has no family members. 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   Social Determinants of Health   Financial Resource Strain: Low Risk    Difficulty of Paying Living Expenses: Not very hard  Food Insecurity: No Food Insecurity   Worried About Charity fundraiser in the Last Year: Never true   Arboriculturist in the Last Year: Never true  Transportation Needs: Unmet Transportation Needs   Lack of Transportation (Medical): Yes   Lack of Transportation (Non-Medical): Yes  Physical Activity: Not on file  Stress: Not on file  Social Connections: Not on file   Allergies  Allergen Reactions   Codeine Nausea  Only   Fentanyl Nausea And Vomiting    Dizziness, lightheaded     Medications   Medications Prior to Admission  Medication Sig Dispense Refill Last Dose   allopurinol (ZYLOPRIM) 100 MG tablet Take 2 tablets (200 mg total) by mouth daily. (Patient taking differently: Take 100 mg by mouth daily.) 180 tablet 1 Past Month   ascorbic acid (VITAMIN C) 500 MG tablet Take 500 mg by mouth daily.   01/01/2021   levothyroxine (SYNTHROID) 50 MCG tablet Take 1 tablet (50 mcg total) by mouth daily before breakfast. 90 tablet 1 01/03/2021   NATTOKINASE PO Take 2 capsules by mouth daily.   01/03/2021   triamcinolone cream (KENALOG) 0.1 % Apply 1 application topically daily as needed (rash).    01/01/2021   Vitamin D-Vitamin K (VITAMIN K2-VITAMIN D3 PO) Take 1 tablet by mouth daily.    01/01/2021   vitamin E 180 MG (400 UNITS) capsule Take 400 Units by mouth daily.   01/01/2021   febuxostat (ULORIC) 40 MG tablet Take 1 tablet (40 mg total) by mouth daily. (Patient not taking: No sig reported) 30 tablet 3 Not Taking   S-Adenosylmethionine (SAM-E) 200 MG TABS Take 1 tablet (200 mg total) by mouth in the morning and at bedtime. (Patient not taking: No sig reported) 60 tablet 6 Not Taking     Vitals   Vitals:   01/03/21 1123 01/03/21 1540 01/03/21 1907 01/03/21 2047  BP: (!) 163/104 (!) 171/81 (!) 180/86 (!) 164/82  Pulse: 91 94 95 88  Resp: 18 18 20 18   Temp:   98.1 F (36.7 C) 98.4 F (36.9 C)  TempSrc:   Oral Oral  SpO2: 99% 95% 98% 99%  Weight:      Height:         Body mass index is 40.35 kg/m.  Physical Exam   General: Laying comfortably in bed; in no acute distress.  HENT: Normal oropharynx and mucosa. Normal external appearance of ears and nose.  Neck: Supple, no pain or tenderness  CV: No JVD. No peripheral edema.  Pulmonary: Symmetric Chest rise. Normal respiratory effort.  Abdomen: Soft to touch, non-tender.  Ext: No cyanosis, edema, or deformity  Skin: No rash. Normal palpation of  skin.   Musculoskeletal: Normal digits and nails by inspection. No clubbing.   Neurologic Examination  Mental status/Cognition: Alert, oriented to  self, place, month and year, good attention.  Speech/language: Moderate dysarthria, fluent, comprehension intact, object naming intact, repetition intact.  Cranial nerves:   CN II Pupils equal and reactive to light, no VF deficits    CN III,IV,VI EOM intact, no gaze preference or deviation, no nystagmus    CN V normal sensation in V1, V2, and V3 segments bilaterally    CN VII Right lower facial droop with flattening of the nasolabial fold.   CN VIII normal hearing to speech    CN IX & X normal palatal elevation, no uvular deviation    CN XI 5/5 head turn and 5/5 shoulder shrug bilaterally    CN XII midline tongue protrusion    Motor:  Muscle bulk: normal, tone normal, pronator drift none tremor none Mvmt Root Nerve  Muscle Right Left Comments  SA C5/6 Ax Deltoid 5 5   EF C5/6 Mc Biceps 5 5   EE C6/7/8 Rad Triceps 5 5   WF C6/7 Med FCR     WE C7/8 PIN ECU     F Ab C8/T1 U ADM/FDI 5 5   HF L1/2/3 Fem Illopsoas 5 5   KE L2/3/4 Fem Quad 5 5   DF L4/5 D Peron Tib Ant 5 5   PF S1/2 Tibial Grc/Sol 5 5    Reflexes:  Right Left Comments  Pectoralis      Biceps (C5/6) 2 2   Brachioradialis (C5/6) 2 2    Triceps (C6/7) 2 2    Patellar (L3/4) 2 2    Achilles (S1) 0 0    Hoffman      Plantar     Jaw jerk    Sensation:  Light touch Intact to light touch in all extremities.   Pin prick    Temperature    Vibration   Proprioception    Coordination/Complex Motor:  - Finger to Nose intact BL - Heel to shin ?mild dysmettria in RLE. - Rapid alternating movement are slowed in RUE. - Gait: unsafe to assess given RLE dysmettria and obesity with BMI of 40+  Labs   CBC:  Recent Labs  Lab 01/03/21 0916  WBC 6.8  NEUTROABS 4.5  HGB 12.1  HCT 37.2  MCV 88.6  PLT 025    Basic Metabolic Panel:  Lab Results  Component Value Date    NA 143 01/03/2021   K 4.3 01/03/2021   CO2 30 01/03/2021   GLUCOSE 116 (H) 01/03/2021   BUN 35 (H) 01/03/2021   CREATININE 1.26 (H) 01/03/2021   CALCIUM 9.6 01/03/2021   GFRNONAA 47 (L) 01/03/2021   GFRAA 57 (L) 03/16/2020   Lipid Panel:  Lab Results  Component Value Date   LDLCALC 112 (H) 02/07/2020   HgbA1c:  Lab Results  Component Value Date   HGBA1C 5.3 11/02/2020   Urine Drug Screen: No results found for: LABOPIA, COCAINSCRNUR, LABBENZ, AMPHETMU, THCU, LABBARB  Alcohol Level No results found for: ETH  CT Head without contrast: CTH was negative for a large hypodensity concerning for a large territory infarct or hyperdensity concerning for an ICH. Does demonstrate small vessel disease along with small hypodensities concerning for prior small strokes.  MR Angio head without contrast and Carotid Duplex BL: pending  MRI Brain w/o contrast: pending  Impression   Cynthia Cook is a 65 y.o. female with PMH significant for Afibb(unable to afford eliquis and urethral and nose bleed on warfarin in the past), hx of PE, hypothyroidism, gout, hx of orthostatic syncope  who presented to [facility] ED with chief complaint of R facial droop and slurred speech. Her neurologic examination is notable for R lower facial droop, dysarthric speech and mild dysmettria and slowed rapid alternating movements on the right. Symptoms are concerning for a stroke.  Likely cardioembolic vs small vessel stroke.  Primary Diagnosis:  Cerebral infarction due to embolism of  unspecified cerebral artery.   Secondary Diagnosis: Paroxysmal atrial fibrillation and Morbid Obesity(BMI > 40)  Recommendations  Plan:  Recommend that primary team order following: - Frequent Neuro checks per stroke unit protocol - Recommend brain imaging with MRI Brain without contrast - Recommend Vascular imaging with MRA Angio Head without contrast and US Carotid doppler - Recommend obtaining TTE - Recommend  obtaining Lipid panel with LDL - Please start statin if LDL > 70 - Recommend HbA1c - Antithrombotic - continue aspirin and plavix for now. Will need to discuss time for transitioning for Anticoagulation based on the size of the stroke on MRI Brain. When transitioning to Anticoagulation, will need to stop antiplatelet agents and stop DVT prophylaxis. - Recommend DVT ppx - SBP goal - permissive hypertension first 24 h < 220/110. Held home meds.  - Recommend Telemetry monitoring for arrythmia - Recommend bedside swallow screen prior to PO intake. - Stroke education booklet - Recommend PT/OT/SLP consult - TOC consult in AM to help with assistance with obtaining eliquis or other anticoagulation.  __________________________________________________________________  Plan discussed with patient and with Dr. Bridgett Larsson with the Hospitalist team at bedside and over secure chat.  Thank you for the opportunity to take part in the care of this patient. If you have any further questions, please contact the neurology consultation attending.  Signed,  [place] Pager Number [phone] _ _ _   _ __   _ __ _ _  __ __   _ __   __ _

## 2021-01-03 NOTE — Subjective & Objective (Signed)
CC: dysarthria x 3 days HPI: 65 year old female with a history of paroxysmal atrial fibrillation, hypothyroidism, CKD stage III, obesity presents to the ER today with 3-day history of dysarthria and mild facial droop.  Patient states that she noticed her first symptoms on Saturday.  Patient states that she had talked to her friend of hers over the phone.  The friend of hers asked her if she was eating food while she was talking.  Patient states that she was not eating.  That is when she noticed that she was having dysarthria.  Patient did not have a ride to the ER.  She waited until Monday until she could find a ride and someone to dog sit her pet at home.  Patient came to the ER at [facility].  CT scan had demonstrated a new focus in the left external capsule in the left superior frontal subcortical area.  Teleneurology was consulted.  They recommended patient be transferred to the hospital for further evaluation.  Patient denies any chest pain shortness of breath.  No nausea no vomiting.  She realizes now that she does have some dysarthric speech.  She also noticed that she has a right facial droop.  Patient be admitted for further stroke work-up.

## 2021-01-03 NOTE — ED Notes (Signed)
Per Glass blower/designer at [employer] the [facility], Cynthia Cook, states that because the patient is refusing cardiac monitoring, changing into a gown, and transporting via [facility] that the patient shoulder either leave and sign AMA or agree to be transferred by EMS.

## 2021-01-03 NOTE — H&P (Signed)
History and Physical    Cynthia Cook WUJ:[id number] DOB: January 31, 1956 DOA: 01/03/2021  PCP: Eunice Blase, MD   Patient coming from: Home  I have personally briefly reviewed patient's old medical records in [place]  CC: dysarthria x 3 days HPI: 65 year old female with a history of paroxysmal atrial fibrillation, hypothyroidism, CKD stage III, obesity presents to the ER today with 3-day history of dysarthria and mild facial droop.  Patient states that she noticed her first symptoms on Saturday.  Patient states that she had talked to her friend of hers over the phone.  The friend of hers asked her if she was eating food while she was talking.  Patient states that she was not eating.  That is when she noticed that she was having dysarthria.  Patient did not have a ride to the ER.  She waited until Monday until she could find a ride and someone to dog sit her pet at home.  Patient came to the ER at [facility].  CT scan had demonstrated a new focus in the left external capsule in the left superior frontal subcortical area.  Teleneurology was consulted.  They recommended patient be transferred to the hospital for further evaluation.  Patient denies any chest pain shortness of breath.  No nausea no vomiting.  She realizes now that she does have some dysarthric speech.  She also noticed that she has a right facial droop.  Patient be admitted for further stroke work-up.   ED Course: CT head shows new CVA. Transferred to [facility] for further workup.  Review of Systems:  Review of Systems  Constitutional: Negative.   HENT: Negative.    Eyes: Negative.   Respiratory: Negative.    Cardiovascular: Negative.   Gastrointestinal: Negative.   Genitourinary: Negative.   Musculoskeletal: Negative.   Skin: Negative.   Neurological:  Positive for speech change.       Right facial droop  Endo/Heme/Allergies: Negative.   Psychiatric/Behavioral: Negative.    All other systems reviewed and  are negative.  Past Medical History:  Diagnosis Date   Back pain    Gout    Murmur    PE (pulmonary embolism)    Persistent atrial fibrillation (HCC)    Pulmonary emboli (HCC) 02/05/2020   Syncope    Thyroid disease     History reviewed. No pertinent surgical history.   reports that she has never smoked. She has never used smokeless tobacco. She reports that she does not drink alcohol and does not use drugs.  Allergies  Allergen Reactions   Codeine Nausea Only   Fentanyl Nausea And Vomiting    Dizziness, lightheaded     Family History  Family history unknown: Yes    Prior to Admission medications   Medication Sig Start Date End Date Taking? Authorizing Provider  allopurinol (ZYLOPRIM) 100 MG tablet Take 2 tablets (200 mg total) by mouth daily. Patient taking differently: Take 100 mg by mouth daily. 11/03/20  Yes Hilts, Legrand Como, MD  ascorbic acid (VITAMIN C) 500 MG tablet Take 500 mg by mouth daily.   Yes [provider]  levothyroxine (SYNTHROID) 50 MCG tablet Take 1 tablet (50 mcg total) by mouth daily before breakfast. 11/02/20  Yes Hilts, Michael, MD  NATTOKINASE PO Take 2 capsules by mouth daily.   Yes [provider]  triamcinolone cream (KENALOG) 0.1 % Apply 1 application topically daily as needed (rash).  02/09/20  Yes [provider]  Vitamin D-Vitamin K (VITAMIN K2-VITAMIN D3 PO)  Take 1 tablet by mouth daily.    Yes [provider]  vitamin E 180 MG (400 UNITS) capsule Take 400 Units by mouth daily.   Yes [provider]  febuxostat (ULORIC) 40 MG tablet Take 1 tablet (40 mg total) by mouth daily. Patient not taking: No sig reported 11/03/20   Hilts, Legrand Como, MD  S-Adenosylmethionine (SAM-E) 200 MG TABS Take 1 tablet (200 mg total) by mouth in the morning and at bedtime. Patient not taking: No sig reported 09/08/20   Eunice Blase, MD    Physical Exam: Vitals:   01/03/21 0930 01/03/21 1123 01/03/21 1540 01/03/21 1907   BP:  (!) 163/104 (!) 171/81 (!) 180/86  Pulse: 86 91 94 95  Resp: 19 18 18 20   Temp:    98.1 F (36.7 C)  TempSrc:    Oral  SpO2: 96% 99% 95% 98%  Weight:      Height:        Physical Exam Vitals and nursing note reviewed.  Constitutional:      General: She is not in acute distress.    Appearance: Normal appearance. She is obese. She is not ill-appearing, toxic-appearing or diaphoretic.  HENT:     Head: Normocephalic and atraumatic.  Cardiovascular:     Rate and Rhythm: Normal rate and regular rhythm.     Heart sounds: Murmur heard.  Pulmonary:     Effort: Pulmonary effort is normal. No respiratory distress.     Breath sounds: Normal breath sounds. No wheezing or rales.  Abdominal:     General: Bowel sounds are normal. There is no distension.     Palpations: Abdomen is soft.     Tenderness: There is no abdominal tenderness. There is no guarding or rebound.  Skin:    General: Skin is warm and dry.     Capillary Refill: Capillary refill takes less than 2 seconds.  Neurological:     Mental Status: She is alert and oriented to person, place, and time.     Cranial Nerves: Facial asymmetry present.     Motor: No weakness.     Coordination: Finger-Nose-Finger Test and Heel to [place] Test normal.     Deep Tendon Reflexes:     Reflex Scores:      Tricep reflexes are 2+ on the right side and 2+ on the left side.      Bicep reflexes are 2+ on the right side and 2+ on the left side.      Brachioradialis reflexes are 2+ on the right side and 2+ on the left side.      Patellar reflexes are 2+ on the right side and 2+ on the left side.      Achilles reflexes are 2+ on the right side and 2+ on the left side.    Comments: Right facial droop Mild dysdiadochokinesia on right hand +dysarthric speech  Psychiatric:        Thought Content: Thought content normal.     Labs on Admission: I have personally reviewed following labs and imaging studies  CBC: Recent Labs  Lab 01/03/21 0916   WBC 6.8  NEUTROABS 4.5  HGB 12.1  HCT 37.2  MCV 88.6  PLT 992   Basic Metabolic Panel: Recent Labs  Lab 01/03/21 0916  NA 143  K 4.3  CL 105  CO2 30  GLUCOSE 116*  BUN 35*  CREATININE 1.26*  CALCIUM 9.6   GFR: Estimated Creatinine Clearance: 55.3 mL/min (A) (by C-G formula based on  SCr of 1.26 mg/dL (H)). Liver Function Tests: No results for input(s): AST, ALT, ALKPHOS, BILITOT, PROT, ALBUMIN in the last 168 hours. No results for input(s): LIPASE, AMYLASE in the last 168 hours. No results for input(s): AMMONIA in the last 168 hours. Coagulation Profile: No results for input(s): INR, PROTIME in the last 168 hours. Cardiac Enzymes: No results for input(s): CKTOTAL, CKMB, CKMBINDEX, TROPONINI in the last 168 hours. BNP (last 3 results) No results for input(s): PROBNP in the last 8760 hours. HbA1C: No results for input(s): HGBA1C in the last 72 hours. CBG: No results for input(s): GLUCAP in the last 168 hours. Lipid Profile: No results for input(s): CHOL, HDL, LDLCALC, TRIG, CHOLHDL, LDLDIRECT in the last 72 hours. Thyroid Function Tests: No results for input(s): TSH, T4TOTAL, FREET4, T3FREE, THYROIDAB in the last 72 hours. Anemia Panel: No results for input(s): VITAMINB12, FOLATE, FERRITIN, TIBC, IRON, RETICCTPCT in the last 72 hours. Urine analysis:    Component Value Date/Time   COLORURINE YELLOW 02/27/2020 0210   APPEARANCEUR CLEAR 02/27/2020 0210   LABSPEC 1.018 02/27/2020 0210   PHURINE 5.0 02/27/2020 0210   GLUCOSEU NEGATIVE 02/27/2020 0210   HGBUR NEGATIVE 02/27/2020 0210   BILIRUBINUR NEGATIVE 02/27/2020 0210   KETONESUR NEGATIVE 02/27/2020 0210   PROTEINUR 30 (A) 02/27/2020 0210   NITRITE NEGATIVE 02/27/2020 0210   LEUKOCYTESUR NEGATIVE 02/27/2020 0210    Radiological Exams on Admission: I have personally reviewed images CT HEAD WO CONTRAST (5MM)  Result Date: 01/03/2021 CLINICAL DATA:  66 year old female with abnormal speech since yesterday. EXAM:  CT HEAD WITHOUT CONTRAST TECHNIQUE: Contiguous axial images were obtained from the base of the skull through the vertex without intravenous contrast. COMPARISON:  Head CT 02/05/2020. FINDINGS: Brain: Stable cerebral volume. No midline shift, ventriculomegaly, mass effect, evidence of mass lesion, acute intracranial hemorrhage. Patchy bilateral cerebral white matter hypodensity. New focal hypodensity at the posterior left external capsule since last year on series 2, image 14. And left superior frontal lobe subcortical and possibly cortical encephalomalacia is new or progressed since last year on coronal image 34. Small chronic right cerebellar infarct is stable. But there are new small hypodense infarcts in the left superior cerebellum on series 4, image 19. ASPECTS 9 (abnormal left M5). Vascular: Extensive Calcified atherosclerosis at the skull base. Skull: Stable, negative. Sinuses/Orbits: Visualized paranasal sinuses and mastoids are stable and well aerated. Other: Visualized orbits and scalp soft tissues are within normal limits. IMPRESSION: 1. Progression of small to medium-sized vessel ischemic changes in both the Left MCA and Left superior cerebellar artery territories since last year. Hypodensity in those areas is age indeterminate. 2. Underlying chronic small vessel disease. No acute intracranial hemorrhage or mass effect. Electronically Signed   By: Genevie Ann M.D.   On: 01/03/2021 09:54    EKG: I have personally reviewed EKG: shows NSR   Assessment/Plan Principal Problem:   Acute CVA (cerebrovascular accident) ([place]) Active Problems:   PAF (paroxysmal atrial fibrillation) ([place])   Hypothyroidism   Class 2 obesity due to excess calories without serious comorbidity with body mass index (BMI) of 36.0 to 36.9 in adult   CKD (chronic kidney disease) stage 3, GFR 30-59 ml/min (HCC) - baseline SCr 1.3    Acute CVA (cerebrovascular accident) ([place]) Admit to med/surg telemetry bed. Observation. Check  MRI/MRA brain, carotid dopplers, bubble echo. Continue with ASA. Discussed with neurology. Hold on plavix. Stroke team will determine when to start [place]. Pt with hx of GI bleeding while on coumadin many years  ago. Check lipid panel. SQ heparin for DVT prophylaxis.  PAF (paroxysmal atrial fibrillation) (HCC) Pt has not been taking any antiplatelet or systemic anticoagulants prior to admission. Currently in NSR.  Hypothyroidism Check TSH  Class 2 obesity due to excess calories without serious comorbidity with body mass index (BMI) of 36.0 to 36.9 in adult Chronic.  CKD (chronic kidney disease) stage 3, GFR 30-59 ml/min (HCC) - baseline SCr 1.3 Stable Scr.  DVT prophylaxis: SQ Heparin Code Status: Full Code Family Communication: no family at bedside  Disposition Plan: return home  Consults called: neurology has seen patient at bedside  Admission status: Observation, Telemetry bed   Kristopher Oppenheim, DO [facility] 01/03/2021, 8:44 PM

## 2021-01-03 NOTE — Progress Notes (Addendum)
01/03/2021 transfer via carelink from [facility] [place] to 2west at 1900. She is alert, oriented to person, place, time and situation. Telemetry was placed on patient, central monitor was made aware. Dr Bridgett Larsson was notified the patient was on the unit. [facility] RN.

## 2021-01-03 NOTE — ED Notes (Signed)
RN concerned about patient safety due to pt being transferred POV. RN stated she would call back in a few minutes.

## 2021-01-03 NOTE — ED Notes (Signed)
Pt declines cardiac monitoring at this time

## 2021-01-04 ENCOUNTER — Observation Stay (HOSPITAL_BASED_OUTPATIENT_CLINIC_OR_DEPARTMENT_OTHER): Payer: Medicare Other

## 2021-01-04 ENCOUNTER — Encounter: Payer: Self-pay | Admitting: Cardiology

## 2021-01-04 ENCOUNTER — Observation Stay (HOSPITAL_COMMUNITY): Payer: Medicare Other

## 2021-01-04 ENCOUNTER — Other Ambulatory Visit (HOSPITAL_COMMUNITY): Payer: Self-pay

## 2021-01-04 DIAGNOSIS — R4701 Aphasia: Secondary | ICD-10-CM | POA: Diagnosis present

## 2021-01-04 DIAGNOSIS — I480 Paroxysmal atrial fibrillation: Secondary | ICD-10-CM | POA: Diagnosis present

## 2021-01-04 DIAGNOSIS — Z6841 Body Mass Index (BMI) 40.0 and over, adult: Secondary | ICD-10-CM | POA: Diagnosis not present

## 2021-01-04 DIAGNOSIS — Z20822 Contact with and (suspected) exposure to covid-19: Secondary | ICD-10-CM | POA: Diagnosis present

## 2021-01-04 DIAGNOSIS — Z79890 Hormone replacement therapy (postmenopausal): Secondary | ICD-10-CM | POA: Diagnosis not present

## 2021-01-04 DIAGNOSIS — E1122 Type 2 diabetes mellitus with diabetic chronic kidney disease: Secondary | ICD-10-CM | POA: Diagnosis present

## 2021-01-04 DIAGNOSIS — N1831 Chronic kidney disease, stage 3a: Secondary | ICD-10-CM | POA: Diagnosis present

## 2021-01-04 DIAGNOSIS — E038 Other specified hypothyroidism: Secondary | ICD-10-CM | POA: Diagnosis not present

## 2021-01-04 DIAGNOSIS — I6381 Other cerebral infarction due to occlusion or stenosis of small artery: Secondary | ICD-10-CM | POA: Diagnosis present

## 2021-01-04 DIAGNOSIS — T45526A Underdosing of antithrombotic drugs, initial encounter: Secondary | ICD-10-CM | POA: Diagnosis present

## 2021-01-04 DIAGNOSIS — M109 Gout, unspecified: Secondary | ICD-10-CM | POA: Diagnosis present

## 2021-01-04 DIAGNOSIS — I080 Rheumatic disorders of both mitral and aortic valves: Secondary | ICD-10-CM | POA: Diagnosis present

## 2021-01-04 DIAGNOSIS — G8929 Other chronic pain: Secondary | ICD-10-CM | POA: Diagnosis present

## 2021-01-04 DIAGNOSIS — R4781 Slurred speech: Secondary | ICD-10-CM | POA: Diagnosis present

## 2021-01-04 DIAGNOSIS — Z79899 Other long term (current) drug therapy: Secondary | ICD-10-CM | POA: Diagnosis not present

## 2021-01-04 DIAGNOSIS — I639 Cerebral infarction, unspecified: Secondary | ICD-10-CM | POA: Diagnosis present

## 2021-01-04 DIAGNOSIS — Z86711 Personal history of pulmonary embolism: Secondary | ICD-10-CM | POA: Diagnosis not present

## 2021-01-04 DIAGNOSIS — I6389 Other cerebral infarction: Secondary | ICD-10-CM

## 2021-01-04 DIAGNOSIS — R471 Dysarthria and anarthria: Secondary | ICD-10-CM | POA: Diagnosis present

## 2021-01-04 DIAGNOSIS — R29810 Facial weakness: Secondary | ICD-10-CM | POA: Diagnosis present

## 2021-01-04 DIAGNOSIS — E039 Hypothyroidism, unspecified: Secondary | ICD-10-CM | POA: Diagnosis present

## 2021-01-04 DIAGNOSIS — M549 Dorsalgia, unspecified: Secondary | ICD-10-CM | POA: Diagnosis present

## 2021-01-04 DIAGNOSIS — Z91120 Patient's intentional underdosing of medication regimen due to financial hardship: Secondary | ICD-10-CM | POA: Diagnosis not present

## 2021-01-04 DIAGNOSIS — Y92009 Unspecified place in unspecified non-institutional (private) residence as the place of occurrence of the external cause: Secondary | ICD-10-CM | POA: Diagnosis not present

## 2021-01-04 DIAGNOSIS — E785 Hyperlipidemia, unspecified: Secondary | ICD-10-CM | POA: Diagnosis present

## 2021-01-04 DIAGNOSIS — Z885 Allergy status to narcotic agent status: Secondary | ICD-10-CM | POA: Diagnosis not present

## 2021-01-04 LAB — ECHOCARDIOGRAM COMPLETE BUBBLE STUDY
AR max vel: 0.99 cm2
AV Area VTI: 1.19 cm2
AV Area mean vel: 1.02 cm2
AV Mean grad: 17.5 mmHg
AV Peak grad: 29.6 mmHg
Ao pk vel: 2.72 m/s
Area-P 1/2: 3.34 cm2
MV M vel: 6.45 m/s
MV Peak grad: 166.2 mmHg
MV VTI: 2.05 cm2
Radius: 0.8 cm
S' Lateral: 3.2 cm

## 2021-01-04 LAB — HEMOGLOBIN A1C
Hgb A1c MFr Bld: 5.5 % (ref 4.8–5.6)
Mean Plasma Glucose: 111.15 mg/dL

## 2021-01-04 LAB — LIPID PANEL
Cholesterol: 200 mg/dL (ref 0–200)
HDL: 49 mg/dL (ref >40)
LDL Cholesterol: 136 mg/dL — ABNORMAL HIGH (ref 0–99)
Total CHOL/HDL Ratio: 4.1 RATIO
Triglycerides: 76 mg/dL (ref <150)
VLDL: 15 mg/dL (ref 0–40)

## 2021-01-04 LAB — TSH: TSH: 6.366 u[IU]/mL — ABNORMAL HIGH (ref 0.350–4.500)

## 2021-01-04 MED ORDER — OXYCODONE-ACETAMINOPHEN 5-325 MG PO TABS
1.0000 | ORAL_TABLET | Freq: Once | ORAL | Status: AC
  Administered 2021-01-04: 1 via ORAL
  Filled 2021-01-04: qty 1

## 2021-01-04 MED ORDER — LORAZEPAM 1 MG PO TABS
1.0000 mg | ORAL_TABLET | ORAL | Status: AC
  Administered 2021-01-04: 1 mg via ORAL
  Filled 2021-01-04: qty 1

## 2021-01-04 MED ORDER — ATORVASTATIN CALCIUM 40 MG PO TABS
40.0000 mg | ORAL_TABLET | Freq: Every day | ORAL | Status: DC
  Filled 2021-01-04: qty 1

## 2021-01-04 MED ORDER — LORAZEPAM 2 MG/ML IJ SOLN: 1.0000 mg | INTRAMUSCULAR | Status: DC

## 2021-01-04 NOTE — Progress Notes (Signed)
PT refused to where telemetry box and ask if we could not bothered the rest of the night.

## 2021-01-04 NOTE — Evaluation (Addendum)
Physical Therapy Evaluation Patient Details Name: Cynthia Cook MRN: [id number] DOB: [birth year] Today's Date: 01/04/2021  History of Present Illness  Pt is a 65 y/o female who presented to the ER at [facility] for x3 day history of dysarthria and mild facial droop. CT revealed a progression of small to medium-sized vessel ischemic changes in both the Left MCA and Left superior cerebellar artery territories since last year. 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 Patient transferred to [facility] for further evaluation. PMH significant for paroxysmal a-fib, hypothyroidism, CKD stage III, obesity.   Clinical Impression  Pt admitted with above diagnosis. Pt currently with functional limitations due to the deficits listed below (see PT Problem List). At the time of PT eval, pt was able to demonstrate transfers and ambulation with gross min assist and RW for support. Pt typically manages with the QC however was very unsafe with the QC this session, reaching out for external support and requiring heavy assist to prevent fall. Pt with decreased safety awareness and decreased awareness of deficits. She reports that getting home to her dog, Cynthia Cook, is the most important thing to her. Pt is at an increased risk for falls at this time, and at even higher risk if attempting to manage a dog/leash. Discussed rehab options and pt unwilling to consider any option except home because of [place]. Ideally, CIR would be the most beneficial d/c disposition, however pt adamantly refusing to discuss it if that means she will be away from [place]. At end of session pt willing to stay at least 1 more night but no more than 2, to work with therapies further to decrease risk for falls and maximize functional independence and safety. Acutely, pt will benefit from skilled PT to increase their independence and safety with mobility to allow discharge to the venue listed below.         Recommendations for follow up therapy are one component of a multi-disciplinary discharge planning process, led by the attending physician.  Recommendations may be updated based on patient status, additional functional criteria and insurance authorization.  Follow Up Recommendations Home health PT;Supervision for mobility/OOB    Equipment Recommendations  Rolling walker with 5" wheels;3in1 (PT)    Recommendations for Other Services       Precautions / Restrictions Precautions Precautions: Fall Precaution Comments: Very dysarthric Restrictions Weight Bearing Restrictions: No      Mobility  Bed Mobility               General bed mobility comments: Pt was received sitting up in the chair.    Transfers Overall transfer level: Needs assistance Equipment used: Quad cane;Rolling walker (2 wheeled) Transfers: Sit to/from Stand Sit to Stand: Min assist         General transfer comment: Assist for power-up to full stand. Increased time and effort required. VC's for hand placement on seated surface for safety.  Ambulation/Gait Ambulation/Gait assistance: Min assist Gait Distance (Feet): 130 Feet Assistive device: Rolling walker (2 wheeled);Quad cane Gait Pattern/deviations: Step-through pattern;Decreased stride length;Trunk flexed;Antalgic;Shuffle;Wide base of support Gait velocity: Decreased Gait velocity interpretation: <1.8 ft/sec, indicate of risk for recurrent falls General Gait Details: Very wide BOS and kicking walker legs when she advanced them. Pt requiring min assist for balance support and safety. Very unsafe with QC - recommend RW for now.  Stairs            Wheelchair Mobility    Modified Rankin (Stroke Patients Only)  Balance Overall balance assessment: Needs assistance Sitting-balance support: Feet supported;No upper extremity supported Sitting balance-Leahy Scale: Fair     Standing balance support: Single extremity supported;During  functional activity Standing balance-Leahy Scale: Poor Standing balance comment: Better with RW however "Poor" overall                             Pertinent Vitals/Pain Pain Assessment: No/denies pain    Home Living Family/patient expects to be discharged to:: Private residence Living Arrangements: Alone Available Help at Discharge: Family;Available PRN/intermittently (Pt reports no assist) Type of Home: Apartment Home Access: Level entry     Home Layout: One level Home Equipment: Cane - quad      Prior Function Level of Independence: Independent with assistive device(s)         Comments: uses the QC when she leaves the house. Drives, does all shopping, cooking, laundry. States she doesn't do much cleaning because it's hard for her.     Hand Dominance   Dominant Hand: Right    Extremity/Trunk Assessment   Upper Extremity Assessment Upper Extremity Assessment: Defer to OT evaluation    Lower Extremity Assessment Lower Extremity Assessment: RLE deficits/detail RLE Deficits / Details: Mild weakness bilaterally (4/5 in quads, hamstrings, hip flexors, ankle DF), and equal. No reported sensation changes with light touch testing.    Cervical / Trunk Assessment Cervical / Trunk Assessment: Kyphotic  Communication   Communication: Expressive difficulties  Cognition Arousal/Alertness: Awake/alert Behavior During Therapy:  (Emotional at times) Overall Cognitive Status: Impaired/Different from baseline Area of Impairment: Safety/judgement;Problem solving                         Safety/Judgement: Decreased awareness of safety;Decreased awareness of deficits   Problem Solving: Slow processing;Requires verbal cues        General Comments      Exercises     Assessment/Plan    PT Assessment Patient needs continued PT services  PT Problem List Decreased strength;Decreased activity tolerance;Decreased balance;Decreased mobility;Decreased  knowledge of use of DME;Decreased safety awareness;Decreased knowledge of precautions;Decreased cognition;Decreased coordination       PT Treatment Interventions DME instruction;Gait training;Functional mobility training;Therapeutic activities;Therapeutic exercise;Balance training;Neuromuscular re-education;Cognitive remediation;Patient/family education    PT Goals (Current goals can be found in the Care Plan section)  Acute Rehab PT Goals Patient Stated Goal: Get home to her dog, [place] PT Goal Formulation: With patient Time For Goal Achievement: 01/18/21 Potential to Achieve Goals: Fair    Frequency Min 4X/week   Barriers to discharge        Co-evaluation               AM-PAC PT "6 Clicks" Mobility  Outcome Measure Help needed turning from your back to your side while in a flat bed without using bedrails?: A Little Help needed moving from lying on your back to sitting on the side of a flat bed without using bedrails?: A Little Help needed moving to and from a bed to a chair (including a wheelchair)?: A Little Help needed standing up from a chair using your arms (e.g., wheelchair or bedside chair)?: A Little Help needed to walk in hospital room?: A Little Help needed climbing 3-5 steps with a railing? : A Little 6 Click Score: 18    End of Session Equipment Utilized During Treatment: Gait belt Activity Tolerance: Patient tolerated treatment well Patient left: in chair;with call bell/phone within reach Nurse  Communication: Mobility status PT Visit Diagnosis: Unsteadiness on feet (R26.81);Other symptoms and signs involving the nervous system (R29.898)    Time: 6759-1638 PT Time Calculation (min) (ACUTE ONLY): 29 min   Charges:   PT Evaluation $PT Eval Moderate Complexity: 1 Mod PT Treatments $Gait Training: 8-22 mins        Rolinda Roan, PT, DPT Acute Rehabilitation Services Pager: [phone] Office: [phone]   Thelma Comp 01/04/2021, 3:34  PM
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
## 2021-01-04 NOTE — Progress Notes (Signed)
Echocardiogram 2D Echocardiogram has been performed.  Oneal Deputy [name] RDCS 01/04/2021, 12:13 PM

## 2021-01-04 NOTE — Progress Notes (Signed)
PROGRESS NOTE    Cynthia Cook  WPY:[id number] DOB: 1956/01/31 DOA: 01/03/2021 PCP: Eunice Blase, MD   Brief Narrative: Taken from H&P. 65 year old female with a history of paroxysmal atrial fibrillation, hypothyroidism, CKD stage III, obesity presents to the ER today with 3-day history of dysarthria and mild facial droop.  Patient states that she noticed her first symptoms on Saturday.  She waited till Monday to come to ED as she did not had any right and also wants someone to dog sit for her.  CT head with concern of new left external capsule infarct, MRI with left centrum semiovale infarction.  CTA negative for any significant stenosis.  Ultrasound carotid without any significant stenosis. Echocardiogram with normal EF, grade 2 diastolic dysfunction, mitral regurgitation and moderate aortic stenosis.  Bubble studies were negative for any shunt. A1c of 5.5, TSH mildly elevated at 6.36, lipid panel with LDL of 136 and goal of 70. Creatinine around baseline.  PT recommended CIR but patient declined it.  She does not even want to go to SNF.  Patient apparently lives alone with her dog and independent at baseline.  Per patient she does not have any relative, only a friend.  Apparently friend can only take care of her dog for 1 to 2 days and she wants to go home with maximum home health services?? Keeping for 1 to 2 days to get some rehab.  Subjective: Patient was seen and examined today.  Denies any complaint continues to have some dysarthria.  He was very worried about her dog and really wants to go home.  She does not have any relative and only a friend, she would like to communicate with her friend herself.  Assessment & Plan:   Principal Problem:   Acute CVA (cerebrovascular accident) ([place]) Active Problems:   Hypothyroidism   PAF (paroxysmal atrial fibrillation) (HCC)   Class 2 obesity due to excess calories without serious comorbidity with body mass index (BMI) of 36.0 to 36.9 in  adult   CKD (chronic kidney disease) stage 3, GFR 30-59 ml/min ([facility]) - baseline SCr 1.3   Stroke ([facility])  Acute CVA.  Imaging positive for acute left external capsule/left centrum semiovale infarct.  MRI with some lacunar infarcts.  Patient had an history of paroxysmal atrial fibrillation and was not on any anticoagulation.  Per patient she was only given blood thinner for 2 weeks and remained in sinus rhythm after cardioversion at that time. Stroke work-up completed-patient appears to get benefit from CIR as she was not stable on her feet and lives alone-but she declined and only agrees to stay 1-2 more days to get some rehab while in the hospital as she was very concerned about her dog and her friend can only take care of her.  For 1 to 2 days. -Neurology is on board-appreciate their help -Continue with DAPT for 3 weeks per neurology -Continue with statin -She will need home health services on discharge  Paroxysmal atrial fibrillation.  Apparently just 1 episode and she was not on any anticoagulation.  Currently normal sinus rhythm. -Continue to monitor  Hypothyroidism.  Mildly elevated TSH -Continue with Synthroid -TSH needs to be rechecked at PCP office  CKD stage IIIa.  Stable. -Continue to monitor  Class 2 obesity due to excess calories without serious comorbidity with body mass index (BMI) of 36.0 to 36.9 in adult Chronic.  Objective: Vitals:   01/03/21 1907 01/03/21 2047 01/03/21 2327 01/04/21 0344  BP: (!) 180/86 (!) 164/82 (!) 149/65 Marland Kitchen)  141/90  Pulse: 95 88 60 62  Resp: 20 18 18 18   Temp: 98.1 F (36.7 C) 98.4 F (36.9 C) 98.2 F (36.8 C) 97.8 F (36.6 C)  TempSrc: Oral Oral Oral Oral  SpO2: 98% 99% 96% 98%  Weight:      Height:        Intake/Output Summary (Last 24 hours) at 01/04/2021 1521 Last data filed at 01/04/2021 0842 Gross per 24 hour  Intake 330 ml  Output --  Net 330 ml   Filed Weights   01/03/21 0905  Weight: 113.4 kg    Examination:  General  exam: Appears calm and comfortable  Respiratory system: Clear to auscultation. Respiratory effort normal. Cardiovascular system: S1 & S2 heard, RRR.  Gastrointestinal system: Soft, nontender, nondistended, bowel sounds positive. Central nervous system: Alert and oriented.  Right facial drop, mild dysarthria, no apparent motor deficit Extremities: No edema, no cyanosis, pulses intact and symmetrical. Psychiatry: Judgement and insight appear normal.    DVT prophylaxis: Subcu heparin Code Status: Full Family Communication: Per patient no family member available, she wants to communicate with her friend herself. Disposition Plan:  Status is: Inpatient  Remains inpatient appropriate because:Inpatient level of care appropriate due to severity of illness  Dispo: The patient is from: Home              Anticipated d/c is to:  To be determined              Patient currently is not medically stable to d/c.   Difficult to place patient No             Level of care: Telemetry Medical  All the records are reviewed and case discussed with Care Management/Social Worker. Management plans discussed with the patient, nursing and they are in agreement.  Consultants:  Neurology  Procedures:  Antimicrobials:   Data Reviewed: I have personally reviewed following labs and imaging studies  CBC: Recent Labs  Lab 01/03/21 0916  WBC 6.8  NEUTROABS 4.5  HGB 12.1  HCT 37.2  MCV 88.6  PLT 833   Basic Metabolic Panel: Recent Labs  Lab 01/03/21 0916  NA 143  K 4.3  CL 105  CO2 30  GLUCOSE 116*  BUN 35*  CREATININE 1.26*  CALCIUM 9.6   GFR: Estimated Creatinine Clearance: 55.3 mL/min (A) (by C-G formula based on SCr of 1.26 mg/dL (H)). Liver Function Tests: No results for input(s): AST, ALT, ALKPHOS, BILITOT, PROT, ALBUMIN in the last 168 hours. No results for input(s): LIPASE, AMYLASE in the last 168 hours. No results for input(s): AMMONIA in the last 168 hours. Coagulation  Profile: No results for input(s): INR, PROTIME in the last 168 hours. Cardiac Enzymes: No results for input(s): CKTOTAL, CKMB, CKMBINDEX, TROPONINI in the last 168 hours. BNP (last 3 results) No results for input(s): PROBNP in the last 8760 hours. HbA1C: Recent Labs    01/04/21 0225  HGBA1C 5.5   CBG: No results for input(s): GLUCAP in the last 168 hours. Lipid Profile: Recent Labs    01/04/21 0225  CHOL 200  HDL 49  LDLCALC 136*  TRIG 76  CHOLHDL 4.1   Thyroid Function Tests: Recent Labs    01/04/21 0225  TSH 6.366*   Anemia Panel: No results for input(s): VITAMINB12, FOLATE, FERRITIN, TIBC, IRON, RETICCTPCT in the last 72 hours. Sepsis Labs: No results for input(s): PROCALCITON, LATICACIDVEN in the last 168 hours.  Recent Results (from the past 240 hour(s))  Resp Panel  by RT-PCR (Flu A&B, Covid) Nasopharyngeal Swab     Status: None   Collection Time: 01/03/21  9:35 AM   Specimen: Nasopharyngeal Swab; Nasopharyngeal(NP) swabs in vial transport medium  Result Value Ref Range Status   SARS Coronavirus 2 by RT PCR NEGATIVE NEGATIVE Final    Comment: (NOTE) SARS-CoV-2 target nucleic acids are NOT DETECTED.  The SARS-CoV-2 RNA is generally detectable in upper respiratory specimens during the acute phase of infection. The lowest concentration of SARS-CoV-2 viral copies this assay can detect is 138 copies/mL. A negative result does not preclude SARS-Cov-2 infection and should not be used as the sole basis for treatment or other patient management decisions. A negative result may occur with  improper specimen collection/handling, submission of specimen other than nasopharyngeal swab, presence of viral mutation(s) within the areas targeted by this assay, and inadequate number of viral copies(<138 copies/mL). A negative result must be combined with clinical observations, patient history, and epidemiological information. The expected result is Negative.  Fact Sheet for  Patients:  EntrepreneurPulse.com.au  Fact Sheet for Healthcare Providers:  IncredibleEmployment.be  This test is no t yet approved or cleared by the [place] FDA and  has been authorized for detection and/or diagnosis of SARS-CoV-2 by FDA under an Emergency Use Authorization (EUA). This EUA will remain  in effect (meaning this test can be used) for the duration of the COVID-19 declaration under Section 564(b)(1) of the Act, 21 U.S.C.section 360bbb-3(b)(1), unless the authorization is terminated  or revoked sooner.       Influenza A by PCR NEGATIVE NEGATIVE Final   Influenza B by PCR NEGATIVE NEGATIVE Final    Comment: (NOTE) The Xpert Xpress SARS-CoV-2/FLU/RSV plus assay is intended as an aid in the diagnosis of influenza from Nasopharyngeal swab specimens and should not be used as a sole basis for treatment. Nasal washings and aspirates are unacceptable for Xpert Xpress SARS-CoV-2/FLU/RSV testing.  Fact Sheet for Patients: EntrepreneurPulse.com.au  Fact Sheet for Healthcare Providers: IncredibleEmployment.be  This test is not yet approved or cleared by the [place] FDA and has been authorized for detection and/or diagnosis of SARS-CoV-2 by FDA under an Emergency Use Authorization (EUA). This EUA will remain in effect (meaning this test can be used) for the duration of the COVID-19 declaration under Section 564(b)(1) of the Act, 21 U.S.C. section 360bbb-3(b)(1), unless the authorization is terminated or revoked.  Performed at [facility], [address] [place],  [address]      Radiology Studies: CT HEAD WO CONTRAST (5MM)  Result Date: 01/03/2021 CLINICAL DATA:  65 year old female with abnormal speech since yesterday. EXAM: CT HEAD WITHOUT CONTRAST TECHNIQUE: Contiguous axial images were obtained from the base of the skull through the vertex without intravenous  contrast. COMPARISON:  Head CT 02/05/2020. FINDINGS: Brain: Stable cerebral volume. No midline shift, ventriculomegaly, mass effect, evidence of mass lesion, acute intracranial hemorrhage. Patchy bilateral cerebral white matter hypodensity. New focal hypodensity at the posterior left external capsule since last year on series 2, image 14. And left superior frontal lobe subcortical and possibly cortical encephalomalacia is new or progressed since last year on coronal image 34. Small chronic right cerebellar infarct is stable. But there are new small hypodense infarcts in the left superior cerebellum on series 4, image 19. ASPECTS 9 (abnormal left M5). Vascular: Extensive Calcified atherosclerosis at the skull base. Skull: Stable, negative. Sinuses/Orbits: Visualized paranasal sinuses and mastoids are stable and well aerated. Other: Visualized orbits and scalp soft tissues are within normal limits.  IMPRESSION: 1. Progression of small to medium-sized vessel ischemic changes in both the Left MCA and Left superior cerebellar artery territories since last year. Hypodensity in those areas is age indeterminate. 2. Underlying chronic small vessel disease. No acute intracranial hemorrhage or mass effect. Electronically Signed   By: Genevie Ann M.D.   On: 01/03/2021 09:54   MR ANGIO HEAD WO CONTRAST  Result Date: 01/04/2021 CLINICAL DATA:  Three day history of dysarthria and mild facial droop EXAM: MRI HEAD WITHOUT CONTRAST MRA HEAD WITHOUT CONTRAST TECHNIQUE: Multiplanar, multi-echo pulse sequences of the brain and surrounding structures were acquired without intravenous contrast. Angiographic images of the Circle of Willis were acquired using MRA technique without intravenous contrast. COMPARISON:  Noncontrast CT head dated 1 day prior FINDINGS: MRI HEAD FINDINGS Brain: There is focal diffusion restriction with associated T2/FLAIR signal abnormality in the left centrum semiovale consistent with evolving early subacute  infarct. There is no evidence of hemorrhagic transformation. There is no other acute infarct there are small remote lacunar infarcts in the bilateral basal ganglia and cerebellar hemispheres. There is no evidence of acute intracranial hemorrhage. There is a single punctate chronic microhemorrhage in the right centrum semiovale, nonspecific. There is mild global parenchymal volume loss with commensurate enlargement of the ventricular system. Additional foci of FLAIR signal abnormality in the subcortical and periventricular white matter are nonspecific but likely reflects sequela of chronic white matter microangiopathy. There is no mass lesion.  There is no midline shift. Vascular: The major flow voids are present. The vascularity is better assessed on the MRI described below. Skull and upper cervical spine: Normal marrow signal. Sinuses/Orbits: The paranasal sinuses are clear. The globes and orbits are unremarkable. Other: None. MRA HEAD FINDINGS Anterior circulation: The bilateral intracranial ICAs are patent. The bilateral MCAs and ACAs are patent. There is no hemodynamically significant stenosis or occlusion. There is no aneurysm. Posterior circulation: The bilateral V4 segments are patent. The basilar artery is patent. The bilateral PCAs are patent. There is no hemodynamically significant stenosis or occlusion. There is no aneurysm. Anatomic variants: None. IMPRESSION: 1. Small early subacute infarct in the left centrum semiovale. 2. Patent intracranial vasculature, with no hemodynamically significant stenosis or occlusion. 3. Global parenchymal volume loss and chronic white matter microangiopathy. 4. Remote lacunar infarcts in the bilateral basal ganglia and cerebellar hemispheres. Electronically Signed   By: Valetta Mole M.D.   On: 01/04/2021 08:37   MR BRAIN WO CONTRAST  Result Date: 01/04/2021 CLINICAL DATA:  Three day history of dysarthria and mild facial droop EXAM: MRI HEAD WITHOUT CONTRAST MRA HEAD  WITHOUT CONTRAST TECHNIQUE: Multiplanar, multi-echo pulse sequences of the brain and surrounding structures were acquired without intravenous contrast. Angiographic images of the Circle of Willis were acquired using MRA technique without intravenous contrast. COMPARISON:  Noncontrast CT head dated 1 day prior FINDINGS: MRI HEAD FINDINGS Brain: There is focal diffusion restriction with associated T2/FLAIR signal abnormality in the left centrum semiovale consistent with evolving early subacute infarct. There is no evidence of hemorrhagic transformation. There is no other acute infarct there are small remote lacunar infarcts in the bilateral basal ganglia and cerebellar hemispheres. There is no evidence of acute intracranial hemorrhage. There is a single punctate chronic microhemorrhage in the right centrum semiovale, nonspecific. There is mild global parenchymal volume loss with commensurate enlargement of the ventricular system. Additional foci of FLAIR signal abnormality in the subcortical and periventricular white matter are nonspecific but likely reflects sequela of chronic white matter microangiopathy. There is  no mass lesion.  There is no midline shift. Vascular: The major flow voids are present. The vascularity is better assessed on the MRI described below. Skull and upper cervical spine: Normal marrow signal. Sinuses/Orbits: The paranasal sinuses are clear. The globes and orbits are unremarkable. Other: None. MRA HEAD FINDINGS Anterior circulation: The bilateral intracranial ICAs are patent. The bilateral MCAs and ACAs are patent. There is no hemodynamically significant stenosis or occlusion. There is no aneurysm. Posterior circulation: The bilateral V4 segments are patent. The basilar artery is patent. The bilateral PCAs are patent. There is no hemodynamically significant stenosis or occlusion. There is no aneurysm. Anatomic variants: None. IMPRESSION: 1. Small early subacute infarct in the left centrum  semiovale. 2. Patent intracranial vasculature, with no hemodynamically significant stenosis or occlusion. 3. Global parenchymal volume loss and chronic white matter microangiopathy. 4. Remote lacunar infarcts in the bilateral basal ganglia and cerebellar hemispheres. Electronically Signed   By: Valetta Mole M.D.   On: 01/04/2021 08:37   ECHOCARDIOGRAM COMPLETE BUBBLE STUDY  Result Date: 01/04/2021    ECHOCARDIOGRAM REPORT   Patient Name:   YACINE DROZ Date of Exam: 01/04/2021 Medical Rec #:  [id number]      Height:       66.0 in Accession #:    [id number]     Weight:       250.0 lb Date of Birth:  11-09-1955       BSA:          2.199 m Patient Age:    4 years       BP:           141/90 mmHg Patient Gender: F              HR:           88 bpm. Exam Location:  Inpatient Procedure: 2D Echo, Color Doppler and Cardiac Doppler Indications:    Stroke i63.9  History:        Patient has prior history of Echocardiogram examinations, most                 recent 02/06/2020. Arrythmias:Atrial Fibrillation.  Sonographer:    Raquel Sarna Senior RDCS Referring Phys: [place]  1. Left ventricular ejection fraction, by estimation, is 55 to 60%. The left ventricle has normal function. The left ventricle has no regional wall motion abnormalities. Left ventricular diastolic parameters are consistent with Grade I diastolic dysfunction (impaired relaxation).  2. Right ventricular systolic function is normal. The right ventricular size is normal.  3. Left atrial size was moderately dilated.  4. The mitral valve is degenerative. Moderate mitral valve regurgitation. Mild to moderate mitral stenosis. The mean mitral valve gradient is 5.7 mmHg. Severe mitral annular calcification.  5. The aortic valve is calcified. There is moderate calcification of the aortic valve. There is moderate thickening of the aortic valve. Aortic valve regurgitation is trivial. Mild to moderate aortic valve stenosis. Aortic valve area, by VTI measures  1.19 cm. Aortic valve mean gradient measures 17.5 mmHg. Aortic valve Vmax measures 2.72 m/s.  6. Aortic dilatation noted. There is mild dilatation of the ascending aorta, measuring 41 mm.  7. The inferior vena cava is normal in size with greater than 50% respiratory variability, suggesting right atrial pressure of 3 mmHg.  8. Agitated saline contrast bubble study was negative, with no evidence of any interatrial shunt. Comparison(s): No significant change from prior study. Prior images reviewed side by side. Conclusion(s)/Recommendation(s): No intracardiac source  of embolism detected on this transthoracic study. A transesophageal echocardiogram is recommended to exclude cardiac source of embolism if clinically indicated. FINDINGS  Left Ventricle: Left ventricular ejection fraction, by estimation, is 55 to 60%. The left ventricle has normal function. The left ventricle has no regional wall motion abnormalities. The left ventricular internal cavity size was normal in size. There is  no left ventricular hypertrophy. Left ventricular diastolic parameters are consistent with Grade I diastolic dysfunction (impaired relaxation). Right Ventricle: The right ventricular size is normal. No increase in right ventricular wall thickness. Right ventricular systolic function is normal. Left Atrium: Left atrial size was moderately dilated. Right Atrium: Right atrial size was normal in size. Pericardium: There is no evidence of pericardial effusion. Mitral Valve: The mitral valve is degenerative in appearance. There is severe thickening of the mitral valve leaflet(s). There is severe calcification of the mitral valve leaflet(s). Severe mitral annular calcification. Moderate mitral valve regurgitation. Mild to moderate mitral valve stenosis. MV peak gradient, 11.2 mmHg. The mean mitral valve gradient is 5.7 mmHg. Tricuspid Valve: The tricuspid valve is normal in structure. Tricuspid valve regurgitation is not demonstrated. No  evidence of tricuspid stenosis. Aortic Valve: The aortic valve is calcified. There is moderate calcification of the aortic valve. There is moderate thickening of the aortic valve. Aortic valve regurgitation is trivial. Mild to moderate aortic stenosis is present. Aortic valve mean gradient measures 17.5 mmHg. Aortic valve peak gradient measures 29.6 mmHg. Aortic valve area, by VTI measures 1.19 cm. Pulmonic Valve: The pulmonic valve was normal in structure. Pulmonic valve regurgitation is not visualized. No evidence of pulmonic stenosis. Aorta: Aortic dilatation noted. There is mild dilatation of the ascending aorta, measuring 41 mm. Venous: The inferior vena cava is normal in size with greater than 50% respiratory variability, suggesting right atrial pressure of 3 mmHg. IAS/Shunts: No atrial level shunt detected by color flow Doppler. Agitated saline contrast was given intravenously to evaluate for intracardiac shunting. Agitated saline contrast bubble study was negative, with no evidence of any interatrial shunt.  LEFT VENTRICLE PLAX 2D LVIDd:         4.40 cm  Diastology LVIDs:         3.20 cm  LV e' medial:    3.59 cm/s LV PW:         1.20 cm  LV E/e' medial:  49.9 LV IVS:        1.10 cm  LV e' lateral:   6.53 cm/s LVOT diam:     2.00 cm  LV E/e' lateral: 27.4 LV SV:         67 LV SV Index:   30 LVOT Area:     3.14 cm  RIGHT VENTRICLE RV S prime:     9.90 cm/s TAPSE (M-mode): 1.6 cm LEFT ATRIUM             Index       RIGHT ATRIUM           Index LA diam:        4.80 cm 2.18 cm/m  RA Area:     15.90 cm LA Vol (A2C):   91.8 ml 41.75 ml/m RA Volume:   42.80 ml  19.47 ml/m LA Vol (A4C):   92.4 ml 42.02 ml/m LA Biplane Vol: 93.5 ml 42.52 ml/m  AORTIC VALVE AV Area (Vmax):    0.99 cm AV Area (Vmean):   1.02 cm AV Area (VTI):     1.19 cm AV Vmax:  272.00 cm/s AV Vmean:          197.000 cm/s AV VTI:            0.563 m AV Peak Grad:      29.6 mmHg AV Mean Grad:      17.5 mmHg LVOT Vmax:         85.80  cm/s LVOT Vmean:        63.900 cm/s LVOT VTI:          0.213 m LVOT/AV VTI ratio: 0.38  AORTA Ao Root diam: 3.30 cm Ao Asc diam:  4.10 cm MITRAL VALVE MV Area (PHT): 3.34 cm      SHUNTS MV Area VTI:   2.05 cm      Systemic VTI:  0.21 m MV Peak grad:  11.2 mmHg     Systemic Diam: 2.00 cm MV Mean grad:  5.7 mmHg MV Vmax:       1.68 m/s MV Vmean:      106.7 cm/s MV Decel Time: 227 msec MR Peak grad:    166.2 mmHg MR Mean grad:    96.0 mmHg MR Vmax:         644.50 cm/s MR Vmean:        459.5 cm/s MR PISA:         4.02 cm MR PISA Eff ROA: 22 mm MR PISA Radius:  0.80 cm MV E velocity: 179.00 cm/s MV A velocity: 110.00 cm/s MV E/A ratio:  1.63 Candee Furbish MD Electronically signed by Candee Furbish MD Signature Date/Time: 01/04/2021/1:42:55 PM    Final    VAS US CAROTID  Result Date: 01/04/2021 Carotid Arterial Duplex Study Patient Name:  ADALIA PETTIS  Date of Exam:   01/04/2021 Medical Rec #: [id number]       Accession #:    [id number] Date of Birth: 1956-01-21        Patient Gender: F Patient Age:   64 years Exam Location:  [facility] Procedure:      VAS US CAROTID Referring Phys: ERIC CHEN --------------------------------------------------------------------------------  Indications:   CVA. Other Factors: Persistent atrial fibrillation. Performing Technologist: Maudry Mayhew MHA, RDMS, RVT, RDCS  Examination Guidelines: A complete evaluation includes B-mode imaging, spectral Doppler, color Doppler, and power Doppler as needed of all accessible portions of each vessel. Bilateral testing is considered an integral part of a complete examination. Limited examinations for reoccurring indications may be performed as noted.  Right Carotid Findings: +----------+--------+--------+--------+--------------------------+--------+           PSV cm/sEDV cm/sStenosisPlaque Description        Comments +----------+--------+--------+--------+--------------------------+--------+ CCA Prox  75      10               smooth and heterogenous            +----------+--------+--------+--------+--------------------------+--------+ CCA Distal83      12              heterogenous and irregular         +----------+--------+--------+--------+--------------------------+--------+ ICA Prox  83      22                                                 +----------+--------+--------+--------+--------------------------+--------+ ICA Distal60      16                                                 +----------+--------+--------+--------+--------------------------+--------+  ECA       93                                                         +----------+--------+--------+--------+--------------------------+--------+ +----------+--------+-------+----------------+-------------------+           PSV cm/sEDV cmsDescribe        Arm Pressure (mmHG) +----------+--------+-------+----------------+-------------------+ [id number]             Multiphasic, WNL                    +----------+--------+-------+----------------+-------------------+ +---------+--------+--+--------+-+---------+ VertebralPSV cm/s27EDV cm/s7Antegrade +---------+--------+--+--------+-+---------+  Left Carotid Findings: +----------+--------+--------+--------+--------------------------+---------+           PSV cm/sEDV cm/sStenosisPlaque Description        Comments  +----------+--------+--------+--------+--------------------------+---------+ CCA Prox  73      10              smooth and heterogenous             +----------+--------+--------+--------+--------------------------+---------+ CCA Distal54      13              heterogenous and irregular          +----------+--------+--------+--------+--------------------------+---------+ ICA Prox  60      11              irregular and calcific    Shadowing +----------+--------+--------+--------+--------------------------+---------+ ICA Distal56      14                                                   +----------+--------+--------+--------+--------------------------+---------+ ECA       101                                                         +----------+--------+--------+--------+--------------------------+---------+ +----------+--------+--------+----------------+-------------------+           PSV cm/sEDV cm/sDescribe        Arm Pressure (mmHG) +----------+--------+--------+----------------+-------------------+ [id number]              Multiphasic, WNL                    +----------+--------+--------+----------------+-------------------+ +---------+--------+--+--------+--+---------+ VertebralPSV cm/s46EDV cm/s10Antegrade +---------+--------+--+--------+--+---------+   Summary: Right Carotid: Velocities in the right ICA are consistent with a 1-39% stenosis. Left Carotid: Velocities in the left ICA are consistent with a 1-39% stenosis. Vertebrals:  Bilateral vertebral arteries demonstrate antegrade flow. Subclavians: Normal flow hemodynamics were seen in bilateral subclavian              arteries. *See table(s) above for measurements and observations.  Electronically signed by Deitra Mayo MD on 01/04/2021 at 11:55:55 AM.    Final     Scheduled Meds:  allopurinol  100 mg Oral Daily   aspirin  325 mg Oral Daily   clopidogrel  75 mg Oral Daily   heparin  5,000 Units Subcutaneous Q8H   levothyroxine  50 mcg Oral QAC breakfast   Continuous Infusions:   LOS: 0 days   Time spent: 40 minutes. More than 50%  of the time was spent in counseling/coordination of care  Lorella Nimrod, MD [facility]  If 7PM-7AM, please contact night-coverage [email or web address]  01/04/2021, 3:21 PM   This record has been created using Systems analyst. Errors have been sought and corrected,but may not always be located. Such creation errors do not reflect on the standard of care.

## 2021-01-04 NOTE — TOC Benefit Eligibility Note (Signed)
Patient Teacher, English as a foreign language completed.    The patient is currently admitted and upon discharge could be taking Eliquis 5 mg.  The current 30 day co-pay is, $47.00.   The patient is currently admitted and upon discharge could be taking Xarelto 20 mg.  The current 30 day co-pay is, $47.00.   The patient is insured through [employer] [place], [place] Patient Advocate Specialist [place] Team Direct Number: [phone]  Fax: [phone]

## 2021-01-04 NOTE — Evaluation (Signed)
Speech Language Pathology Evaluation Patient Details Name: Cynthia Cook MRN: [id number] DOB: [birth year] Today's Date: 01/04/2021 Time: 2595-6387 SLP Time Calculation (min) (ACUTE ONLY): 18 min  Problem List:  Patient Active Problem List   Diagnosis Date Noted   Acute CVA (cerebrovascular accident) ([place]) 01/03/2021   CKD (chronic kidney disease) stage 3, GFR 30-59 ml/min (HCC) - baseline SCr 1.3 01/03/2021   Hashimoto's thyroiditis 06/29/2020   Syncope 02/27/2020   Elevated troponin I level    PAF (paroxysmal atrial fibrillation) ([place]) 02/17/2020   Class 2 obesity due to excess calories without serious comorbidity with body mass index (BMI) of 36.0 to 36.9 in adult 02/17/2020   Hypothyroidism 02/06/2020   Past Medical History:  Past Medical History:  Diagnosis Date   Back pain    Gout    Murmur    PE (pulmonary embolism)    Persistent atrial fibrillation (HCC)    Pulmonary emboli ([place]) 02/05/2020   Syncope    Thyroid disease    Past Surgical History: History reviewed. No pertinent surgical history. HPI:  65 year old female with a history of paroxysmal atrial fibrillation, hypothyroidism, CKD stage III, obesity presents to the ER today with 3-day history of dysarthria and mild facial droop. 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   Assessment / Plan / Recommendation Clinical Impression  Pt presents with moderate dysarthria of speech post CVA and mild cognitive deficits. Pt oriented x4, exhibited mild difficulties in delayed recall 3/5, deficits in visual spatial skills during clock drawing, and reduced insight during problem solivng tasks as well as decreased safety awareness functionally (per discussion with PT) . PLOF pt fully independent with all ADLs, states has no family locally, limited help from friends at [place]. Dysarthria of speech c/b imprecise articulation, reduced vocal intensity, impairing communication intelligibility at word,  phrase, sentence and conversational levels. Receptive and expressive language skills were intact. Oral motor exam did reveal right sided facial droop (mild, reduced facial sensation, and decreased lingual strength). Pt did have overt coughing episode with thins via cup. She states episodic dysphagia onset since admission. Will continue to follow for cognitive linguistic deficits and need for dysphagia intervention.    SLP Assessment  SLP Recommendation/Assessment: Patient needs continued Speech Lanaguage Pathology Services SLP Visit Diagnosis: Dysarthria and anarthria (R47.1);Cognitive communication deficit (R41.841)    Recommendations for follow up therapy are one component of a multi-disciplinary discharge planning process, led by the attending physician.  Recommendations may be updated based on patient status, additional functional criteria and insurance authorization.    Follow Up Recommendations  Inpatient Rehab    Frequency and Duration min 2x/week  2 weeks      SLP Evaluation Cognition  Overall Cognitive Status: No family/caregiver present to determine baseline cognitive functioning (24/30 on slums, mild cognitive deficits possibly; pt denies change from baseline) Arousal/Alertness: Awake/alert Orientation Level: Oriented X4 Memory: Impaired Memory Impairment: Decreased recall of new information Awareness: Appears intact Problem Solving: Appears intact       Comprehension  Auditory Comprehension Overall Auditory Comprehension: Appears within functional limits for tasks assessed Reading Comprehension Reading Status: Within funtional limits    Expression Expression Primary Mode of Expression: Verbal Verbal Expression Overall Verbal Expression: Appears within functional limits for tasks assessed Written Expression Dominant Hand: Right   Oral / Motor  Oral Motor/Sensory Function Overall Oral Motor/Sensory Function: Moderate impairment Facial ROM: Reduced right;Suspected CN  VII (facial) dysfunction Facial Symmetry: Abnormal symmetry right Facial Strength: Reduced right Facial Sensation:  Reduced right Lingual ROM: Reduced right Lingual Strength: Suspected CN XII (hypoglossal) dysfunction;Reduced Motor Speech Overall Motor Speech: Impaired Respiration: Impaired Level of Impairment: Word Phonation: Normal Resonance: Within functional limits Articulation: Impaired Level of Impairment: Word Intelligibility: Intelligibility reduced Word: 50-74% accurate Phrase: 25-49% accurate Sentence: 25-49% accurate Conversation: 25-49% accurate Motor Planning: Witnin functional limits Effective Techniques: Slow rate;Over-articulate;Pause   GO                   Hayden Rasmussen MA, CCC-SLP Acute Rehabilitation Services   01/04/2021, 2:42 PM

## 2021-01-04 NOTE — Progress Notes (Signed)
HOSPITAL MEDICINE OVERNIGHT EVENT NOTE     Patient complaining of severe back pain upon being transported downstairs for MRI.  Patient refused MRI prior to undergoing the study.  Patient is now back in her room still complaining of ongoing severe low back pain, states that she has a history of chronic back pain.  We will provide a one-time dose of Percocet.  Cynthia Emerald  MD [facility]

## 2021-01-04 NOTE — Progress Notes (Signed)
Able to swallow 3 oz water with no coughing or choking and without stopping.  Changed to regular diet per MD order.

## 2021-01-04 NOTE — Progress Notes (Signed)
Carotid artery duplex completed. Refer to "CV Proc" under chart review to view preliminary results.  01/04/2021 11:53 AM Kelby Aline., MHA, RVT, RDCS, RDMS

## 2021-01-04 NOTE — Progress Notes (Signed)
PT Cancellation Note  Patient Details Name: Cynthia Cook MRN: [id number] DOB: 03/20/1956   Cancelled Treatment:    Reason Eval/Treat Not Completed: Patient at procedure or test/unavailable. Pt currently off unit. Will check back as schedule allows to initiate PT evaluation.    Thelma Comp 01/04/2021, 11:34 AM  Rolinda Roan, PT, DPT Acute Rehabilitation Services Pager: [phone] Office: [phone]

## 2021-01-05 ENCOUNTER — Other Ambulatory Visit (HOSPITAL_COMMUNITY): Payer: Self-pay

## 2021-01-05 DIAGNOSIS — I639 Cerebral infarction, unspecified: Secondary | ICD-10-CM | POA: Diagnosis not present

## 2021-01-05 DIAGNOSIS — E038 Other specified hypothyroidism: Secondary | ICD-10-CM | POA: Diagnosis not present

## 2021-01-05 DIAGNOSIS — I480 Paroxysmal atrial fibrillation: Secondary | ICD-10-CM | POA: Diagnosis not present

## 2021-01-05 DIAGNOSIS — N1831 Chronic kidney disease, stage 3a: Secondary | ICD-10-CM | POA: Diagnosis not present

## 2021-01-05 MED ORDER — APIXABAN 5 MG PO TABS
5.0000 mg | ORAL_TABLET | Freq: Two times a day (BID) | ORAL | 5 refills | Status: DC
  Filled 2021-01-05: qty 60, 30d supply, fill #0
  Filled 2021-01-20: qty 60, 30d supply, fill #1
  Filled 2021-01-21: qty 60, 30d supply, fill #0
  Filled 2021-03-09: qty 60, 30d supply, fill #1

## 2021-01-05 MED ORDER — ATORVASTATIN CALCIUM 40 MG PO TABS
40.0000 mg | ORAL_TABLET | Freq: Every day | ORAL | 5 refills | Status: DC
  Filled 2021-01-05: qty 30, 30d supply, fill #0

## 2021-01-05 MED ORDER — APIXABAN 5 MG PO TABS
5.0000 mg | ORAL_TABLET | Freq: Two times a day (BID) | ORAL | Status: DC
  Administered 2021-01-05: 5 mg via ORAL
  Filled 2021-01-05: qty 1
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
## 2021-01-05 NOTE — Progress Notes (Signed)
Physical Therapy Treatment Patient Details Name: Cynthia Cook MRN: [id number] DOB: April 29, 1955 Today's Date: 01/05/2021   History of Present Illness Pt is a 65 y/o female who presented to the ER at [facility] for x3 day history of dysarthria and mild facial droop. CT revealed a progression of small to medium-sized vessel ischemic changes in both the Left MCA and Left superior cerebellar artery territories since last year. 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 Patient transferred to [facility] for further evaluation. PMH significant for paroxysmal a-fib, hypothyroidism, CKD stage III, obesity.    PT Comments    Pt dressed and preparing to d/c upon PT arrival, agreeable to gait training prior to d/c. Pt is very unsafe with use of quad cane, demonstrating posterior LOB requiring stepping strategy and reaching for environment to recover. Pt also not sequencing quad cane well or consistently, and when PT provided sequencing cues pt states she was losing her balance because of PT cuing/feeling crowded. SPC trialled to see if this would improved balance and use of AD, pt did not feel supported enough with SPC. PT encouraged pt to trial RW or rollator, pt declining stating this was not functional for her life. PT thoroughly explained how adding in other elements (dog on leash, groceries, purse) would increase fall risk even more, pt appears unconcerned and continues to decline more supportive AD. CIR is still the safest option prior to d/c home, but pt refused, so home with HHPT and support from friends/neighbors recommended.    Recommendations for follow up therapy are one component of a multi-disciplinary discharge planning process, led by the attending physician.  Recommendations may be updated based on patient status, additional functional criteria and insurance authorization.  Follow Up Recommendations  Home health PT;Supervision for mobility/OOB      Equipment Recommendations  Rolling walker with 5" wheels;3in1 (PT) (pt refused RW)    Recommendations for Other Services       Precautions / Restrictions Precautions Precautions: Fall Precaution Comments: Very dysarthric Restrictions Weight Bearing Restrictions: No     Mobility  Bed Mobility Overal bed mobility: Needs Assistance             General bed mobility comments: sitting in recliner, requesting stay in recliner at end of session    Transfers Overall transfer level: Needs assistance Equipment used: Quad cane;Straight cane Transfers: Sit to/from Stand Sit to Stand: Supervision         General transfer comment: for safety  Ambulation/Gait Ambulation/Gait assistance: Min Web designer (Feet): 60 Feet (x2) Assistive device: Quad cane;Straight cane Gait Pattern/deviations: Step-through pattern;Decreased stride length;Trunk flexed;Shuffle;Wide base of support Gait velocity: decr   General Gait Details: assist to steady, x2 posterior LOB with pt using stepping strategy and reaching for environment to correct. max cuing for proper sequencing with quad cane, pt states "no one ever taught me how to use it" and often steps foward to quad cane with only a single prong on the ground, can't pick it up and advance it sufficiently, at very high fall risk with quad cane. PT trialed SPC to see if this would be safer and less unsteady than quad cane, pt did not like it and does not use it well enough to send home with [facility]. Pt refused RW or rollator practice, and declined RW for home use.   Stairs             Wheelchair Mobility    Modified Rankin (Stroke  Patients Only)       Balance Overall balance assessment: Needs assistance Sitting-balance support: Feet supported;No upper extremity supported Sitting balance-Leahy Scale: Fair     Standing balance support: Single extremity supported;During functional activity Standing balance-Leahy Scale:  Poor Standing balance comment: posterior unsteadiness, reliant on external support               High Level Balance Comments: She did bend over and pick up water bottles off the floor, educated on "3 point system) using extra arm to have more stability and support for tasks like dog bowls            Cognition Arousal/Alertness: Awake/alert Behavior During Therapy: WFL for tasks assessed/performed (Emotional at times) Overall Cognitive Status: Impaired/Different from baseline Area of Impairment: Safety/judgement;Problem solving                         Safety/Judgement: Decreased awareness of safety;Decreased awareness of deficits   Problem Solving: Requires verbal cues;Requires tactile cues General Comments: Pt lacks insight into current deficits, able to express she is more unsteady presently but then states she feels "normal". Pt with multiple posterior LOB requiring stepping strategy and reaching for environment to correct, per pt this is because of PT "crowding me, thinking about your instructions"      Exercises      General Comments General comments (skin integrity, edema, etc.): Pt is concerned about her speech. 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 and she is concerned about the loss of income      Pertinent Vitals/Pain Pain Assessment: Faces Faces Pain Scale: No hurt Pain Intervention(s): Monitored during session    [place] expects to be discharged to:: Private residence Living Arrangements: Alone;Other (Comment) (dog Rosco) Available Help at Discharge: Family;Available PRN/intermittently (Pt reports no assist) Type of Home: Apartment Home Access: Level entry   Home Layout: One level Home Equipment: Cane - quad Additional Comments: readjusted q cane for right hand use    Prior Function Level of Independence: Independent with assistive device(s)      Comments: uses the  QC when she leaves the house. Drives, does all shopping, cooking, laundry. States she doesn't do much cleaning because it's hard for her.   PT Goals (current goals can now be found in the care plan section) Acute Rehab PT Goals Patient Stated Goal: Get home to her dog, Roscoe PT Goal Formulation: With patient Time For Goal Achievement: 01/18/21 Potential to Achieve Goals: Fair Progress towards PT goals: Progressing toward goals    Frequency    Min 4X/week      PT Plan Current plan remains appropriate    Co-evaluation              AM-PAC PT "6 Clicks" Mobility   Outcome Measure  Help needed turning from your back to your side while in a flat bed without using bedrails?: A Little Help needed moving from lying on your back to sitting on the side of a flat bed without using bedrails?: A Little Help needed moving to and from a bed to a chair (including a wheelchair)?: A Little Help needed standing up from a chair using your arms (e.g., wheelchair or bedside chair)?: A Little Help needed to walk in hospital room?: A Little Help needed climbing 3-5 steps with a railing? : A Little 6 Click Score: 18    End of Session Equipment Utilized  During Treatment: Gait belt Activity Tolerance: Patient tolerated treatment well Patient left: in chair;with call bell/phone within reach. Pt states RN staff lets pt get up and down without assist, PT requested pt call for assist on call bell before mobilizing  Nurse Communication: Mobility status PT Visit Diagnosis: Unsteadiness on feet (R26.81);Other symptoms and signs involving the nervous system (R29.898)     Time: 8563-1497 PT Time Calculation (min) (ACUTE ONLY): 30 min  Charges:  $Gait Training: 23-37 mins                     Stacie Glaze, PT DPT Acute Rehabilitation Services Pager [phone]  Office [phone]    [place] 01/05/2021, 2:08 PM

## 2021-01-05 NOTE — Progress Notes (Addendum)
Speech Language Pathology Treatment: Cognitive-Linquistic (Dysarthria)  Patient Details Name: Cynthia Cook MRN: [id number] DOB: 04-Dec-1955 Today's Date: 01/05/2021 Time: 5320-2334 SLP Time Calculation (min) (ACUTE ONLY): 19 min  Assessment / Plan / Recommendation Clinical Impression  Pt was seen for dysarthria treatment and was cooperative during the session. She was educated regarding the nature of dysarthria, and compensatory strategies to improve speech intelligibility. Pt verbalized understanding regarding all areas of education. She used compensatory strategies at the phrase level with 70% accuracy increasing to 100% accuracy with cues for overarticulation, vocal intensity and speech rate. At the 5-7 word sentence level she 80% accuracy with self-correction increasing to 80% accuracy with cues for overarticulation and rate. Some generalization was observed at the end of the session, and she was noted to self-correct for use of some of these strategies during conversation. Pt currently has discharge orders and she verbalized agreement with continued SLP services following discharge.    HPI HPI: 65 year old female with a history of paroxysmal atrial fibrillation, hypothyroidism, CKD stage III, obesity presents to the ER today with 3-day history of dysarthria and mild facial droop. 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      SLP Plan  Continue with current plan of care      Recommendations for follow up therapy are one component of a multi-disciplinary discharge planning process, led by the attending physician.  Recommendations may be updated based on patient status, additional functional criteria and insurance authorization.    Recommendations                   Follow up Recommendations: Home health SLP SLP Visit Diagnosis: Dysarthria and anarthria (R47.1);Cognitive communication deficit (R41.841) Plan: Continue with current plan of  care       Lugene Beougher I. Hardin Negus, [place], [place] Office number [phone] Pager [place]  01/05/2021, 11:49 AM

## 2021-01-05 NOTE — TOC Progression Note (Signed)
Transition of Care [facility]) - Progression Note    Patient Details  Name: Cynthia Cook MRN: [id number] Date of Birth: 12-Dec-1955  [facility] CM/SW [place], RN Phone Number:[phone]  01/05/2021, 8:58 AM  Clinical Narrative:    Benefits check initiated for Eliquis 5mg  BID.        Expected Discharge Plan and Services                                                 Social Determinants of Health (SDOH) Interventions    Readmission Risk Interventions No flowsheet data found.

## 2021-01-05 NOTE — TOC Transition Note (Addendum)
Transition of Care [facility]) - CM/SW Discharge Note   Patient Details  Name: Cynthia Cook MRN: [id number] Date of Birth: Jan 07, 1956  Transition of Care [facility]) CM/SW Contact:  Angelita Ingles, RN Phone Number:[phone]  01/05/2021, 11:09 AM   Clinical Narrative:    CM consulted for patient with disposition needs. Benefits check has been completed and patient agrees that $47/month is affordable for her Eliquis. Patient has been provided a 30 days free supply.Patient refuses 3 in 1 and rolling walker but is requesting a shower bench. Shower bench has been ordered with [employer] and will be delivered to the room. Patient has been offered choice. HH has been set up with [facility].    Final next level of care: [place] Barriers to Discharge: No Barriers Identified   Patient Goals and CMS Choice Patient states their goals for this hospitalization and ongoing recovery are:: Patient  wants to get better and go home. CMS Medicare.gov Compare Post Acute Care list provided to:: Patient Choice offered to / list presented to : Patient  Discharge Placement                       Discharge Plan and Services                DME Arranged: Tub bench DME Agency: [employer] Date DME Agency Contacted: 01/05/21 Time DME Agency Contacted: [id number] Representative spoke with at DME Agency: [place]: [place] ([place]) Date [place]: 01/05/21 Time [place]: 1109 Representative spoke with at [place]: [place] ([place]) Interventions     Readmission Risk Interventions No flowsheet data found.

## 2021-01-05 NOTE — Discharge Summary (Signed)
PATIENT DETAILS Name: Cynthia Cook Age: 65 y.o. Sex: female Date of Birth: 1956/02/10 MRN: [id number]. Admitting Physician: Lorella Nimrod, MD IOE:VOJJK, Legrand Como, MD  Admit Date: 01/03/2021 Discharge date: 01/05/2021  Recommendations for Outpatient Follow-up:  Follow up with PCP in 1-2 weeks Please obtain CMP/CBC in one week Please ensure follow-up with stroke clinic, cardiology New medication: Eliquis.  Admitted From:  Home  Disposition: Home with home health services   [place] Yes  Equipment/Devices: None  Discharge Condition: Stable  CODE STATUS: FULL CODE  Diet recommendation:  Diet Order             Diet - low sodium heart healthy           Diet regular Room service appropriate? Yes with Assist; Fluid consistency: Thin  Diet effective now                    Brief Summary: See H&P, Labs, Consult and Test reports for all details in brief, patient is a 65 year old with a history of PAF, hypothyroidism, CKD stage IIIa-who presented with dysarthria/ facial droop-she was found to have acute CVA.  Stroke work-up: 9/27>> MRI brain: Subacute infarct in the left centrum semiovale 9/27>> MRI brain: Patent intracranial vasculature-no significant stenosis/occlusion. 9/27>> Echo: EF 09-38%, grade 1 diastolic dysfunction.  Mild to moderate MS. 9/27>> bilateral carotid Doppler: No significant stenosis. 9/27>> A1c: 5.5 9/27>> LDL: 136  Brief Hospital Course: Acute CVA: Still with some dysarthria-she has a history of PAF-and apparently in the past has been unable to afford Eliquis.  Evaluated by neurology-recommendations were to restart Eliquis if affordable-if not plan was to continue aspirin/Plavix x3 weeks followed by aspirin alone.  Case manager performed benefit check-her co-pay is $47 which patient claims she cannot afford-hence placing her on Eliquis on discharge.  Evaluated by rehab services-recommendations were for CIR-however patient refused-and wants to  go home to be with her dog.  Being discharged with maximal home health services (per nursing staff-walking to the bathroom with the help of a walker).  HLD: Continue high intensity steroids on discharge.  Hypothyroidism: TSH mildly elevated-continue current dosing of Synthroid-follow-up with PCP.  Gout: Stable-no flare-continue allopurinol  CKD stage IIIa: Creatinine close to baseline.  Morbid Obesity: Estimated body mass index is 40.35 kg/m as calculated from the following:   Height as of this encounter: 5\' 6"  (1.676 m).   Weight as of this encounter: 113.4 kg.     Procedures None  Discharge Diagnoses:  Principal Problem:   Acute CVA (cerebrovascular accident) ([place]) Active Problems:   Hypothyroidism   PAF (paroxysmal atrial fibrillation) (HCC)   Class 2 obesity due to excess calories without serious comorbidity with body mass index (BMI) of 36.0 to 36.9 in adult   CKD (chronic kidney disease) stage 3, GFR 30-59 ml/min (HCC) - baseline SCr 1.3   Stroke [facility])   Discharge Instructions:  Activity:  As tolerated with Full fall precautions use walker/cane & assistance as needed Discharge Instructions     Ambulatory referral to Neurology   Complete by: As directed    An appointment is requested in approximately: 8 weeks   Diet - low sodium heart healthy   Complete by: As directed    Discharge instructions   Complete by: As directed    Follow with Primary MD  Hilts, Michael, MD in 1-2 weeks  Please get a complete blood count and chemistry panel checked by your Primary MD at your next visit, and again as instructed  by your Primary MD.  Get Medicines reviewed and adjusted: Please take all your medications with you for your next visit with your Primary MD  Laboratory/radiological data: Please request your Primary MD to go over all hospital tests and procedure/radiological results at the follow up, please ask your Primary MD to get all Hospital records sent to his/her  office.  In some cases, they will be blood work, cultures and biopsy results pending at the time of your discharge. Please request that your primary care M.D. follows up on these results.  Also Note the following: If you experience worsening of your admission symptoms, develop shortness of breath, life threatening emergency, suicidal or homicidal thoughts you must seek medical attention immediately by calling 911 or calling your MD immediately  if symptoms less severe.  You must read complete instructions/literature along with all the possible adverse reactions/side effects for all the Medicines you take and that have been prescribed to you. Take any new Medicines after you have completely understood and accpet all the possible adverse reactions/side effects.   Do not drive when taking Pain medications or sleeping medications (Benzodaizepines)  Do not take more than prescribed Pain, Sleep and Anxiety Medications. It is not advisable to combine anxiety,sleep and pain medications without talking with your primary care practitioner  Special Instructions: If you have smoked or chewed Tobacco  in the last 2 yrs please stop smoking, stop any regular Alcohol  and or any Recreational drug use.  Wear Seat belts while driving.  Please note: You were cared for by a hospitalist during your hospital stay. Once you are discharged, your primary care physician will handle any further medical issues. Please note that NO REFILLS for any discharge medications will be authorized once you are discharged, as it is imperative that you return to your primary care physician (or establish a relationship with a primary care physician if you do not have one) for your post hospital discharge needs so that they can reassess your need for medications and monitor your lab values.   Increase activity slowly   Complete by: As directed       Allergies as of 01/05/2021       Reactions   Codeine Nausea Only   Fentanyl Nausea  And Vomiting   Dizziness, lightheaded         Medication List     STOP taking these medications    febuxostat 40 MG tablet Commonly known as: ULORIC   NATTOKINASE PO       TAKE these medications    allopurinol 100 MG tablet Commonly known as: Zyloprim Take 2 tablets (200 mg total) by mouth daily. What changed: how much to take   ascorbic acid 500 MG tablet Commonly known as: VITAMIN C Take 500 mg by mouth daily.   atorvastatin 40 MG tablet Commonly known as: LIPITOR Take 1 tablet (40 mg total) by mouth daily. Start taking on: January 06, 2021   Eliquis 5 MG Tabs tablet Generic drug: apixaban Take 1 tablet (5 mg total) by mouth 2 (two) times daily.   levothyroxine 50 MCG tablet Commonly known as: Synthroid Take 1 tablet (50 mcg total) by mouth daily before breakfast.   SAM-e 200 MG Tabs Take 1 tablet (200 mg total) by mouth in the morning and at bedtime.   triamcinolone cream 0.1 % Commonly known as: KENALOG Apply 1 application topically daily as needed (rash).   vitamin E 180 MG (400 UNITS) capsule Take 400 Units by mouth daily.  VITAMIN K2-VITAMIN D3 PO Take 1 tablet by mouth daily.               Durable Medical Equipment  (From admission, onward)           Start     Ordered   01/05/21 1051  For home use only DME Shower stool  Once        01/05/21 1051   01/05/21 0850  For home use only DME Walker rolling  Once       Question Answer Comment  Walker: With [place] Wheels   Patient needs a walker to treat with the following condition Physical deconditioning      01/05/21 0850   01/05/21 0850  For home use only DME 3 n 1  Once        01/05/21 0850            Follow-up Information     Hilts, Michael, MD. Schedule an appointment as soon as possible for a visit in 1 week(s).   Specialty: Family Medicine Contact information: [place] [address] [phone]         Buford Dresser, MD Follow up in  2 week(s).   Specialty: Cardiology Contact information: [address] [place] [address] [phone]         Garvin Fila, MD Follow up.   Specialties: Neurology, Radiology Why: Office will call with date/time, If you dont hear from them,please give them a call Contact information: [address] [place] [address] [phone]                Allergies  Allergen Reactions   Codeine Nausea Only   Fentanyl Nausea And Vomiting    Dizziness, lightheaded       Consultations: Neurology   Other Procedures/Studies: CT HEAD WO CONTRAST (5MM)  Result Date: 01/03/2021 CLINICAL DATA:  66 year old female with abnormal speech since yesterday. EXAM: CT HEAD WITHOUT CONTRAST TECHNIQUE: Contiguous axial images were obtained from the base of the skull through the vertex without intravenous contrast. COMPARISON:  Head CT 02/05/2020. FINDINGS: Brain: Stable cerebral volume. No midline shift, ventriculomegaly, mass effect, evidence of mass lesion, acute intracranial hemorrhage. Patchy bilateral cerebral white matter hypodensity. New focal hypodensity at the posterior left external capsule since last year on series 2, image 14. And left superior frontal lobe subcortical and possibly cortical encephalomalacia is new or progressed since last year on coronal image 34. Small chronic right cerebellar infarct is stable. But there are new small hypodense infarcts in the left superior cerebellum on series 4, image 19. ASPECTS 9 (abnormal left M5). Vascular: Extensive Calcified atherosclerosis at the skull base. Skull: Stable, negative. Sinuses/Orbits: Visualized paranasal sinuses and mastoids are stable and well aerated. Other: Visualized orbits and scalp soft tissues are within normal limits. IMPRESSION: 1. Progression of small to medium-sized vessel ischemic changes in both the Left MCA and Left superior cerebellar artery territories since last year. Hypodensity in  those areas is age indeterminate. 2. Underlying chronic small vessel disease. No acute intracranial hemorrhage or mass effect. Electronically Signed   By: Genevie Ann M.D.   On: 01/03/2021 09:54   MR ANGIO HEAD WO CONTRAST  Result Date: 01/04/2021 CLINICAL DATA:  Three day history of dysarthria and mild facial droop EXAM: MRI HEAD WITHOUT CONTRAST MRA HEAD WITHOUT CONTRAST TECHNIQUE: Multiplanar, multi-echo pulse sequences of the brain and surrounding structures were acquired without intravenous contrast. Angiographic images of the [place] were acquired using MRA technique without intravenous contrast. COMPARISON:  Noncontrast CT head dated 1 day prior FINDINGS: MRI HEAD FINDINGS Brain: There is focal diffusion restriction with associated T2/FLAIR signal abnormality in the left centrum semiovale consistent with evolving early subacute infarct. There is no evidence of hemorrhagic transformation. There is no other acute infarct there are small remote lacunar infarcts in the bilateral basal ganglia and cerebellar hemispheres. There is no evidence of acute intracranial hemorrhage. There is a single punctate chronic microhemorrhage in the right centrum semiovale, nonspecific. There is mild global parenchymal volume loss with commensurate enlargement of the ventricular system. Additional foci of FLAIR signal abnormality in the subcortical and periventricular white matter are nonspecific but likely reflects sequela of chronic white matter microangiopathy. There is no mass lesion.  There is no midline shift. Vascular: The major flow voids are present. The vascularity is better assessed on the MRI described below. Skull and upper cervical spine: Normal marrow signal. Sinuses/Orbits: The paranasal sinuses are clear. The globes and orbits are unremarkable. Other: None. MRA HEAD FINDINGS Anterior circulation: The bilateral intracranial ICAs are patent. The bilateral MCAs and ACAs are patent. There is no hemodynamically  significant stenosis or occlusion. There is no aneurysm. Posterior circulation: The bilateral V4 segments are patent. The basilar artery is patent. The bilateral PCAs are patent. There is no hemodynamically significant stenosis or occlusion. There is no aneurysm. Anatomic variants: None. IMPRESSION: 1. Small early subacute infarct in the left centrum semiovale. 2. Patent intracranial vasculature, with no hemodynamically significant stenosis or occlusion. 3. Global parenchymal volume loss and chronic white matter microangiopathy. 4. Remote lacunar infarcts in the bilateral basal ganglia and cerebellar hemispheres. Electronically Signed   By: Valetta Mole M.D.   On: 01/04/2021 08:37   MR BRAIN WO CONTRAST  Result Date: 01/04/2021 CLINICAL DATA:  Three day history of dysarthria and mild facial droop EXAM: MRI HEAD WITHOUT CONTRAST MRA HEAD WITHOUT CONTRAST TECHNIQUE: Multiplanar, multi-echo pulse sequences of the brain and surrounding structures were acquired without intravenous contrast. Angiographic images of the Circle of [place] were acquired using MRA technique without intravenous contrast. COMPARISON:  Noncontrast CT head dated 1 day prior FINDINGS: MRI HEAD FINDINGS Brain: There is focal diffusion restriction with associated T2/FLAIR signal abnormality in the left centrum semiovale consistent with evolving early subacute infarct. There is no evidence of hemorrhagic transformation. There is no other acute infarct there are small remote lacunar infarcts in the bilateral basal ganglia and cerebellar hemispheres. There is no evidence of acute intracranial hemorrhage. There is a single punctate chronic microhemorrhage in the right centrum semiovale, nonspecific. There is mild global parenchymal volume loss with commensurate enlargement of the ventricular system. Additional foci of FLAIR signal abnormality in the subcortical and periventricular white matter are nonspecific but likely reflects sequela of chronic  white matter microangiopathy. There is no mass lesion.  There is no midline shift. Vascular: The major flow voids are present. The vascularity is better assessed on the MRI described below. Skull and upper cervical spine: Normal marrow signal. Sinuses/Orbits: The paranasal sinuses are clear. The globes and orbits are unremarkable. Other: None. MRA HEAD FINDINGS Anterior circulation: The bilateral intracranial ICAs are patent. The bilateral MCAs and ACAs are patent. There is no hemodynamically significant stenosis or occlusion. There is no aneurysm. Posterior circulation: The bilateral V4 segments are patent. The basilar artery is patent. The bilateral PCAs are patent. There is no hemodynamically significant stenosis or occlusion. There is no aneurysm. Anatomic variants: None. IMPRESSION:  1. Small early subacute infarct in the left centrum semiovale. 2. Patent intracranial vasculature, with no hemodynamically significant stenosis or occlusion. 3. Global parenchymal volume loss and chronic white matter microangiopathy. 4. Remote lacunar infarcts in the bilateral basal ganglia and cerebellar hemispheres. Electronically Signed   By: Valetta Mole M.D.   On: 01/04/2021 08:37   ECHOCARDIOGRAM COMPLETE BUBBLE STUDY  Result Date: 01/04/2021    ECHOCARDIOGRAM REPORT   Patient Name:   LILLER YOHN Date of Exam: 01/04/2021 Medical Rec #:  [id number]      Height:       66.0 in Accession #:    [id number]     Weight:       250.0 lb Date of Birth:  [birth year]       BSA:          2.199 m Patient Age:    46 years       BP:           141/90 mmHg Patient Gender: F              HR:           88 bpm. Exam Location:  Inpatient Procedure: 2D Echo, Color Doppler and Cardiac Doppler Indications:    Stroke i63.9  History:        Patient has prior history of Echocardiogram examinations, most                 recent 02/06/2020. Arrythmias:Atrial Fibrillation.  Sonographer:    Raquel Sarna Senior RDCS Referring Phys: [place]  1.  Left ventricular ejection fraction, by estimation, is 55 to 60%. The left ventricle has normal function. The left ventricle has no regional wall motion abnormalities. Left ventricular diastolic parameters are consistent with Grade I diastolic dysfunction (impaired relaxation).  2. Right ventricular systolic function is normal. The right ventricular size is normal.  3. Left atrial size was moderately dilated.  4. The mitral valve is degenerative. Moderate mitral valve regurgitation. Mild to moderate mitral stenosis. The mean mitral valve gradient is 5.7 mmHg. Severe mitral annular calcification.  5. The aortic valve is calcified. There is moderate calcification of the aortic valve. There is moderate thickening of the aortic valve. Aortic valve regurgitation is trivial. Mild to moderate aortic valve stenosis. Aortic valve area, by VTI measures 1.19 cm. Aortic valve mean gradient measures 17.5 mmHg. Aortic valve Vmax measures 2.72 m/s.  6. Aortic dilatation noted. There is mild dilatation of the ascending aorta, measuring 41 mm.  7. The inferior vena cava is normal in size with greater than 50% respiratory variability, suggesting right atrial pressure of 3 mmHg.  8. Agitated saline contrast bubble study was negative, with no evidence of any interatrial shunt. Comparison(s): No significant change from prior study. Prior images reviewed side by side. Conclusion(s)/Recommendation(s): No intracardiac source of embolism detected on this transthoracic study. A transesophageal echocardiogram is recommended to exclude cardiac source of embolism if clinically indicated. FINDINGS  Left Ventricle: Left ventricular ejection fraction, by estimation, is 55 to 60%. The left ventricle has normal function. The left ventricle has no regional wall motion abnormalities. The left ventricular internal cavity size was normal in size. There is  no left ventricular hypertrophy. Left ventricular diastolic parameters are consistent with Grade I  diastolic dysfunction (impaired relaxation). Right Ventricle: The right ventricular size is normal. No increase in right ventricular wall thickness. Right ventricular systolic function is normal. Left Atrium: Left atrial size was moderately dilated. Right Atrium: Right  atrial size was normal in size. Pericardium: There is no evidence of pericardial effusion. Mitral Valve: The mitral valve is degenerative in appearance. There is severe thickening of the mitral valve leaflet(s). There is severe calcification of the mitral valve leaflet(s). Severe mitral annular calcification. Moderate mitral valve regurgitation. Mild to moderate mitral valve stenosis. MV peak gradient, 11.2 mmHg. The mean mitral valve gradient is 5.7 mmHg. Tricuspid Valve: The tricuspid valve is normal in structure. Tricuspid valve regurgitation is not demonstrated. No evidence of tricuspid stenosis. Aortic Valve: The aortic valve is calcified. There is moderate calcification of the aortic valve. There is moderate thickening of the aortic valve. Aortic valve regurgitation is trivial. Mild to moderate aortic stenosis is present. Aortic valve mean gradient measures 17.5 mmHg. Aortic valve peak gradient measures 29.6 mmHg. Aortic valve area, by VTI measures 1.19 cm. Pulmonic Valve: The pulmonic valve was normal in structure. Pulmonic valve regurgitation is not visualized. No evidence of pulmonic stenosis. Aorta: Aortic dilatation noted. There is mild dilatation of the ascending aorta, measuring 41 mm. Venous: The inferior vena cava is normal in size with greater than 50% respiratory variability, suggesting right atrial pressure of 3 mmHg. IAS/Shunts: No atrial level shunt detected by color flow Doppler. Agitated saline contrast was given intravenously to evaluate for intracardiac shunting. Agitated saline contrast bubble study was negative, with no evidence of any interatrial shunt.  LEFT VENTRICLE PLAX 2D LVIDd:         4.40 cm  Diastology LVIDs:          3.20 cm  LV e' medial:    3.59 cm/s LV PW:         1.20 cm  LV E/e' medial:  49.9 LV IVS:        1.10 cm  LV e' lateral:   6.53 cm/s LVOT diam:     2.00 cm  LV E/e' lateral: 27.4 LV SV:         67 LV SV Index:   30 LVOT Area:     3.14 cm  RIGHT VENTRICLE RV S prime:     9.90 cm/s TAPSE (M-mode): 1.6 cm LEFT ATRIUM             Index       RIGHT ATRIUM           Index LA diam:        4.80 cm 2.18 cm/m  RA Area:     15.90 cm LA Vol (A2C):   91.8 ml 41.75 ml/m RA Volume:   42.80 ml  19.47 ml/m LA Vol (A4C):   92.4 ml 42.02 ml/m LA Biplane Vol: 93.5 ml 42.52 ml/m  AORTIC VALVE AV Area (Vmax):    0.99 cm AV Area (Vmean):   1.02 cm AV Area (VTI):     1.19 cm AV Vmax:           272.00 cm/s AV Vmean:          197.000 cm/s AV VTI:            0.563 m AV Peak Grad:      29.6 mmHg AV Mean Grad:      17.5 mmHg LVOT Vmax:         85.80 cm/s LVOT Vmean:        63.900 cm/s LVOT VTI:          0.213 m LVOT/AV VTI ratio: 0.38  AORTA Ao Root diam: 3.30 cm Ao Asc diam:  4.10 cm MITRAL  VALVE MV Area (PHT): 3.34 cm      SHUNTS MV Area VTI:   2.05 cm      Systemic VTI:  0.21 m MV Peak grad:  11.2 mmHg     Systemic Diam: 2.00 cm MV Mean grad:  5.7 mmHg MV Vmax:       1.68 m/s MV Vmean:      106.7 cm/s MV Decel Time: 227 msec MR Peak grad:    166.2 mmHg MR Mean grad:    96.0 mmHg MR Vmax:         644.50 cm/s MR Vmean:        459.5 cm/s MR PISA:         4.02 cm MR PISA Eff ROA: 22 mm MR PISA Radius:  0.80 cm MV E velocity: 179.00 cm/s MV A velocity: 110.00 cm/s MV E/A ratio:  1.63 Candee Furbish MD Electronically signed by Candee Furbish MD Signature Date/Time: 01/04/2021/1:42:55 PM    Final    VAS US CAROTID  Result Date: 01/04/2021 Carotid Arterial Duplex Study Patient Name:  MYLINDA BROOK  Date of Exam:   01/04/2021 Medical Rec #: [id number]       Accession #:    [id number] Date of Birth: Nov 22, 1955        Patient Gender: F Patient Age:   43 years Exam Location:  [facility] Procedure:      VAS US CAROTID Referring Phys:  ERIC CHEN --------------------------------------------------------------------------------  Indications:   CVA. Other Factors: Persistent atrial fibrillation. Performing Technologist: Maudry Mayhew MHA, RDMS, RVT, RDCS  Examination Guidelines: A complete evaluation includes B-mode imaging, spectral Doppler, color Doppler, and power Doppler as needed of all accessible portions of each vessel. Bilateral testing is considered an integral part of a complete examination. Limited examinations for reoccurring indications may be performed as noted.  Right Carotid Findings: +----------+--------+--------+--------+--------------------------+--------+           PSV cm/sEDV cm/sStenosisPlaque Description        Comments +----------+--------+--------+--------+--------------------------+--------+ CCA Prox  75      10              smooth and heterogenous            +----------+--------+--------+--------+--------------------------+--------+ CCA Distal83      12              heterogenous and irregular         +----------+--------+--------+--------+--------------------------+--------+ ICA Prox  83      22                                                 +----------+--------+--------+--------+--------------------------+--------+ ICA Distal60      16                                                 +----------+--------+--------+--------+--------------------------+--------+ ECA       93                                                         +----------+--------+--------+--------+--------------------------+--------+ +----------+--------+-------+----------------+-------------------+  PSV cm/sEDV cmsDescribe        Arm Pressure (mmHG) +----------+--------+-------+----------------+-------------------+ [id number]             Multiphasic, WNL                    +----------+--------+-------+----------------+-------------------+ +---------+--------+--+--------+-+---------+  VertebralPSV cm/s27EDV cm/s7Antegrade +---------+--------+--+--------+-+---------+  Left Carotid Findings: +----------+--------+--------+--------+--------------------------+---------+           PSV cm/sEDV cm/sStenosisPlaque Description        Comments  +----------+--------+--------+--------+--------------------------+---------+ CCA Prox  73      10              smooth and heterogenous             +----------+--------+--------+--------+--------------------------+---------+ CCA Distal54      13              heterogenous and irregular          +----------+--------+--------+--------+--------------------------+---------+ ICA Prox  60      11              irregular and calcific    Shadowing +----------+--------+--------+--------+--------------------------+---------+ ICA Distal56      14                                                  +----------+--------+--------+--------+--------------------------+---------+ ECA       [address]                                                         +----------+--------+--------+--------+--------------------------+---------+ +----------+--------+--------+----------------+-------------------+           PSV cm/sEDV cm/sDescribe        Arm Pressure (mmHG) +----------+--------+--------+----------------+-------------------+ [id number]              Multiphasic, WNL                    +----------+--------+--------+----------------+-------------------+ +---------+--------+--+--------+--+---------+ VertebralPSV cm/s46EDV cm/s10Antegrade +---------+--------+--+--------+--+---------+   Summary: Right Carotid: Velocities in the right ICA are consistent with a 1-39% stenosis. Left Carotid: Velocities in the left ICA are consistent with a 1-39% stenosis. Vertebrals:  Bilateral vertebral arteries demonstrate antegrade flow. Subclavians: Normal flow hemodynamics were seen in bilateral subclavian              arteries. *See table(s) above  for measurements and observations.  Electronically signed by Deitra Mayo MD on 01/04/2021 at 11:55:55 AM.    Final      TODAY-DAY OF DISCHARGE:  Subjective:   Accalia Rigdon today has no headache,no chest abdominal pain,no new weakness tingling or numbness, feels much better wants to go home today.   Objective:   Blood pressure 135/81, pulse 82, temperature 98.2 F (36.8 C), resp. rate 18, height 5\' 6"  (1.676 m), weight 113.4 kg, SpO2 98 %. No intake or output data in the 24 hours ending 01/05/21 1226 Filed Weights   01/03/21 0905  Weight: 113.4 kg    Exam: Awake Alert, Oriented *3, No new F.N deficits, Normal affect [place].AT,PERRAL Supple Neck,No JVD, No cervical lymphadenopathy appriciated.  Symmetrical Chest wall movement, Good air movement bilaterally, CTAB RRR,No Gallops,Rubs or new Murmurs, No Parasternal Heave +ve B.Sounds, Abd Soft, Non tender, No organomegaly appriciated, No rebound -guarding or rigidity.  No Cyanosis, Clubbing or edema, No new Rash or bruise   PERTINENT RADIOLOGIC STUDIES: MR ANGIO HEAD WO CONTRAST  Result Date: 01/04/2021 CLINICAL DATA:  Three day history of dysarthria and mild facial droop EXAM: MRI HEAD WITHOUT CONTRAST MRA HEAD WITHOUT CONTRAST TECHNIQUE: Multiplanar, multi-echo pulse sequences of the brain and surrounding structures were acquired without intravenous contrast. Angiographic images of the Circle of Willis were acquired using MRA technique without intravenous contrast. COMPARISON:  Noncontrast CT head dated 1 day prior FINDINGS: MRI HEAD FINDINGS Brain: There is focal diffusion restriction with associated T2/FLAIR signal abnormality in the left centrum semiovale consistent with evolving early subacute infarct. There is no evidence of hemorrhagic transformation. There is no other acute infarct there are small remote lacunar infarcts in the bilateral basal ganglia and cerebellar hemispheres. There is no evidence of acute intracranial  hemorrhage. There is a single punctate chronic microhemorrhage in the right centrum semiovale, nonspecific. There is mild global parenchymal volume loss with commensurate enlargement of the ventricular system. Additional foci of FLAIR signal abnormality in the subcortical and periventricular white matter are nonspecific but likely reflects sequela of chronic white matter microangiopathy. There is no mass lesion.  There is no midline shift. Vascular: The major flow voids are present. The vascularity is better assessed on the MRI described below. Skull and upper cervical spine: Normal marrow signal. Sinuses/Orbits: The paranasal sinuses are clear. The globes and orbits are unremarkable. Other: None. MRA HEAD FINDINGS Anterior circulation: The bilateral intracranial ICAs are patent. The bilateral MCAs and ACAs are patent. There is no hemodynamically significant stenosis or occlusion. There is no aneurysm. Posterior circulation: The bilateral V4 segments are patent. The basilar artery is patent. The bilateral PCAs are patent. There is no hemodynamically significant stenosis or occlusion. There is no aneurysm. Anatomic variants: None. IMPRESSION: 1. Small early subacute infarct in the left centrum semiovale. 2. Patent intracranial vasculature, with no hemodynamically significant stenosis or occlusion. 3. Global parenchymal volume loss and chronic white matter microangiopathy. 4. Remote lacunar infarcts in the bilateral basal ganglia and cerebellar hemispheres. Electronically Signed   By: Valetta Mole M.D.   On: 01/04/2021 08:37   MR BRAIN WO CONTRAST  Result Date: 01/04/2021 CLINICAL DATA:  Three day history of dysarthria and mild facial droop EXAM: MRI HEAD WITHOUT CONTRAST MRA HEAD WITHOUT CONTRAST TECHNIQUE: Multiplanar, multi-echo pulse sequences of the brain and surrounding structures were acquired without intravenous contrast. Angiographic images of the Circle of Willis were acquired using MRA technique without  intravenous contrast. COMPARISON:  Noncontrast CT head dated 1 day prior FINDINGS: MRI HEAD FINDINGS Brain: There is focal diffusion restriction with associated T2/FLAIR signal abnormality in the left centrum semiovale consistent with evolving early subacute infarct. There is no evidence of hemorrhagic transformation. There is no other acute infarct there are small remote lacunar infarcts in the bilateral basal ganglia and cerebellar hemispheres. There is no evidence of acute intracranial hemorrhage. There is a single punctate chronic microhemorrhage in the right centrum semiovale, nonspecific. There is mild global parenchymal volume loss with commensurate enlargement of the ventricular system. Additional foci of FLAIR signal abnormality in the subcortical and periventricular white matter are nonspecific but likely reflects sequela of chronic white matter microangiopathy. There is no mass lesion.  There is no midline shift. Vascular: The major flow voids are present. The vascularity is better assessed on the MRI described below. Skull and upper cervical spine: Normal marrow signal. Sinuses/Orbits: The paranasal sinuses are clear. The globes and orbits are  unremarkable. Other: None. MRA HEAD FINDINGS Anterior circulation: The bilateral intracranial ICAs are patent. The bilateral MCAs and ACAs are patent. There is no hemodynamically significant stenosis or occlusion. There is no aneurysm. Posterior circulation: The bilateral V4 segments are patent. The basilar artery is patent. The bilateral PCAs are patent. There is no hemodynamically significant stenosis or occlusion. There is no aneurysm. Anatomic variants: None. IMPRESSION: 1. Small early subacute infarct in the left centrum semiovale. 2. Patent intracranial vasculature, with no hemodynamically significant stenosis or occlusion. 3. Global parenchymal volume loss and chronic white matter microangiopathy. 4. Remote lacunar infarcts in the bilateral basal ganglia and  cerebellar hemispheres. Electronically Signed   By: Valetta Mole M.D.   On: 01/04/2021 08:37   ECHOCARDIOGRAM COMPLETE BUBBLE STUDY  Result Date: 01/04/2021    ECHOCARDIOGRAM REPORT   Patient Name:   Cynthia Cook Date of Exam: 01/04/2021 Medical Rec #:  [id number]      Height:       66.0 in Accession #:    [id number]     Weight:       250.0 lb Date of Birth:  [birth year]       BSA:          2.199 m Patient Age:    59 years       BP:           141/90 mmHg Patient Gender: F              HR:           88 bpm. Exam Location:  Inpatient Procedure: 2D Echo, Color Doppler and Cardiac Doppler Indications:    Stroke i63.9  History:        Patient has prior history of Echocardiogram examinations, most                 recent 02/06/2020. Arrythmias:Atrial Fibrillation.  Sonographer:    Raquel Sarna Senior RDCS Referring Phys: [place]  1. Left ventricular ejection fraction, by estimation, is 55 to 60%. The left ventricle has normal function. The left ventricle has no regional wall motion abnormalities. Left ventricular diastolic parameters are consistent with Grade I diastolic dysfunction (impaired relaxation).  2. Right ventricular systolic function is normal. The right ventricular size is normal.  3. Left atrial size was moderately dilated.  4. The mitral valve is degenerative. Moderate mitral valve regurgitation. Mild to moderate mitral stenosis. The mean mitral valve gradient is 5.7 mmHg. Severe mitral annular calcification.  5. The aortic valve is calcified. There is moderate calcification of the aortic valve. There is moderate thickening of the aortic valve. Aortic valve regurgitation is trivial. Mild to moderate aortic valve stenosis. Aortic valve area, by VTI measures 1.19 cm. Aortic valve mean gradient measures 17.5 mmHg. Aortic valve Vmax measures 2.72 m/s.  6. Aortic dilatation noted. There is mild dilatation of the ascending aorta, measuring 41 mm.  7. The inferior vena cava is normal in size with  greater than 50% respiratory variability, suggesting right atrial pressure of 3 mmHg.  8. Agitated saline contrast bubble study was negative, with no evidence of any interatrial shunt. Comparison(s): No significant change from prior study. Prior images reviewed side by side. Conclusion(s)/Recommendation(s): No intracardiac source of embolism detected on this transthoracic study. A transesophageal echocardiogram is recommended to exclude cardiac source of embolism if clinically indicated. FINDINGS  Left Ventricle: Left ventricular ejection fraction, by estimation, is 55 to 60%. The left ventricle has normal function. The left ventricle has  no regional wall motion abnormalities. The left ventricular internal cavity size was normal in size. There is  no left ventricular hypertrophy. Left ventricular diastolic parameters are consistent with Grade I diastolic dysfunction (impaired relaxation). Right Ventricle: The right ventricular size is normal. No increase in right ventricular wall thickness. Right ventricular systolic function is normal. Left Atrium: Left atrial size was moderately dilated. Right Atrium: Right atrial size was normal in size. Pericardium: There is no evidence of pericardial effusion. Mitral Valve: The mitral valve is degenerative in appearance. There is severe thickening of the mitral valve leaflet(s). There is severe calcification of the mitral valve leaflet(s). Severe mitral annular calcification. Moderate mitral valve regurgitation. Mild to moderate mitral valve stenosis. MV peak gradient, 11.2 mmHg. The mean mitral valve gradient is 5.7 mmHg. Tricuspid Valve: The tricuspid valve is normal in structure. Tricuspid valve regurgitation is not demonstrated. No evidence of tricuspid stenosis. Aortic Valve: The aortic valve is calcified. There is moderate calcification of the aortic valve. There is moderate thickening of the aortic valve. Aortic valve regurgitation is trivial. Mild to moderate aortic  stenosis is present. Aortic valve mean gradient measures 17.5 mmHg. Aortic valve peak gradient measures 29.6 mmHg. Aortic valve area, by VTI measures 1.19 cm. Pulmonic Valve: The pulmonic valve was normal in structure. Pulmonic valve regurgitation is not visualized. No evidence of pulmonic stenosis. Aorta: Aortic dilatation noted. There is mild dilatation of the ascending aorta, measuring 41 mm. Venous: The inferior vena cava is normal in size with greater than 50% respiratory variability, suggesting right atrial pressure of 3 mmHg. IAS/Shunts: No atrial level shunt detected by color flow Doppler. Agitated saline contrast was given intravenously to evaluate for intracardiac shunting. Agitated saline contrast bubble study was negative, with no evidence of any interatrial shunt.  LEFT VENTRICLE PLAX 2D LVIDd:         4.40 cm  Diastology LVIDs:         3.20 cm  LV e' medial:    3.59 cm/s LV PW:         1.20 cm  LV E/e' medial:  49.9 LV IVS:        1.10 cm  LV e' lateral:   6.53 cm/s LVOT diam:     2.00 cm  LV E/e' lateral: 27.4 LV SV:         67 LV SV Index:   30 LVOT Area:     3.14 cm  RIGHT VENTRICLE RV S prime:     9.90 cm/s TAPSE (M-mode): 1.6 cm LEFT ATRIUM             Index       RIGHT ATRIUM           Index LA diam:        4.80 cm 2.18 cm/m  RA Area:     15.90 cm LA Vol (A2C):   91.8 ml 41.75 ml/m RA Volume:   42.80 ml  19.47 ml/m LA Vol (A4C):   92.4 ml 42.02 ml/m LA Biplane Vol: 93.5 ml 42.52 ml/m  AORTIC VALVE AV Area (Vmax):    0.99 cm AV Area (Vmean):   1.02 cm AV Area (VTI):     1.19 cm AV Vmax:           272.00 cm/s AV Vmean:          197.000 cm/s AV VTI:            0.563 m AV Peak Grad:  29.6 mmHg AV Mean Grad:      17.5 mmHg LVOT Vmax:         85.80 cm/s LVOT Vmean:        63.900 cm/s LVOT VTI:          0.213 m LVOT/AV VTI ratio: 0.38  AORTA Ao Root diam: 3.30 cm Ao Asc diam:  4.10 cm MITRAL VALVE MV Area (PHT): 3.34 cm      SHUNTS MV Area VTI:   2.05 cm      Systemic VTI:  0.21 m MV  Peak grad:  11.2 mmHg     Systemic Diam: 2.00 cm MV Mean grad:  5.7 mmHg MV Vmax:       1.68 m/s MV Vmean:      106.7 cm/s MV Decel Time: 227 msec MR Peak grad:    166.2 mmHg MR Mean grad:    96.0 mmHg MR Vmax:         644.50 cm/s MR Vmean:        459.5 cm/s MR PISA:         4.02 cm MR PISA Eff ROA: 22 mm MR PISA Radius:  0.80 cm MV E velocity: 179.00 cm/s MV A velocity: 110.00 cm/s MV E/A ratio:  1.63 Candee Furbish MD Electronically signed by Candee Furbish MD Signature Date/Time: 01/04/2021/1:42:55 PM    Final    VAS US CAROTID  Result Date: 01/04/2021 Carotid Arterial Duplex Study Patient Name:  GABRIELL DAIGNEAULT  Date of Exam:   01/04/2021 Medical Rec #: [id number]       Accession #:    [id number] Date of Birth: [birth year]        Patient Gender: F Patient Age:   37 years Exam Location:  [facility] Procedure:      VAS US CAROTID Referring Phys: ERIC CHEN --------------------------------------------------------------------------------  Indications:   CVA. Other Factors: Persistent atrial fibrillation. Performing Technologist: Maudry Mayhew MHA, RDMS, RVT, RDCS  Examination Guidelines: A complete evaluation includes B-mode imaging, spectral Doppler, color Doppler, and power Doppler as needed of all accessible portions of each vessel. Bilateral testing is considered an integral part of a complete examination. Limited examinations for reoccurring indications may be performed as noted.  Right Carotid Findings: +----------+--------+--------+--------+--------------------------+--------+           PSV cm/sEDV cm/sStenosisPlaque Description        Comments +----------+--------+--------+--------+--------------------------+--------+ CCA Prox  75      10              smooth and heterogenous            +----------+--------+--------+--------+--------------------------+--------+ CCA Distal83      12              heterogenous and irregular          +----------+--------+--------+--------+--------------------------+--------+ ICA Prox  83      22                                                 +----------+--------+--------+--------+--------------------------+--------+ ICA Distal60      16                                                 +----------+--------+--------+--------+--------------------------+--------+ ECA  93                                                         +----------+--------+--------+--------+--------------------------+--------+ +----------+--------+-------+----------------+-------------------+           PSV cm/sEDV cmsDescribe        Arm Pressure (mmHG) +----------+--------+-------+----------------+-------------------+ [id number]             Multiphasic, WNL                    +----------+--------+-------+----------------+-------------------+ +---------+--------+--+--------+-+---------+ VertebralPSV cm/s27EDV cm/s7Antegrade +---------+--------+--+--------+-+---------+  Left Carotid Findings: +----------+--------+--------+--------+--------------------------+---------+           PSV cm/sEDV cm/sStenosisPlaque Description        Comments  +----------+--------+--------+--------+--------------------------+---------+ CCA Prox  73      10              smooth and heterogenous             +----------+--------+--------+--------+--------------------------+---------+ CCA Distal54      13              heterogenous and irregular          +----------+--------+--------+--------+--------------------------+---------+ ICA Prox  60      11              irregular and calcific    Shadowing +----------+--------+--------+--------+--------------------------+---------+ ICA Distal56      14                                                  +----------+--------+--------+--------+--------------------------+---------+ ECA       101                                                          +----------+--------+--------+--------+--------------------------+---------+ +----------+--------+--------+----------------+-------------------+           PSV cm/sEDV cm/sDescribe        Arm Pressure (mmHG) +----------+--------+--------+----------------+-------------------+ [id number]              Multiphasic, WNL                    +----------+--------+--------+----------------+-------------------+ +---------+--------+--+--------+--+---------+ VertebralPSV cm/s46EDV cm/s10Antegrade +---------+--------+--+--------+--+---------+   Summary: Right Carotid: Velocities in the right ICA are consistent with a 1-39% stenosis. Left Carotid: Velocities in the left ICA are consistent with a 1-39% stenosis. Vertebrals:  Bilateral vertebral arteries demonstrate antegrade flow. Subclavians: Normal flow hemodynamics were seen in bilateral subclavian              arteries. *See table(s) above for measurements and observations.  Electronically signed by Deitra Mayo MD on 01/04/2021 at 11:55:55 AM.    Final      PERTINENT LAB RESULTS: CBC: Recent Labs    01/03/21 0916  WBC 6.8  HGB 12.1  HCT 37.2  PLT 157    CMET CMP     Component Value Date/Time   NA 143 01/03/2021 0916   NA 144 03/16/2020 1435   K 4.3 01/03/2021 0916   CL 105 01/03/2021 0916   CO2 30 01/03/2021 0916  GLUCOSE 116 (H) 01/03/2021 0916   BUN 35 (H) 01/03/2021 0916   BUN 19 03/16/2020 1435   CREATININE 1.26 (H) 01/03/2021 0916   CREATININE 1.32 (H) 11/02/2020 0859   CALCIUM 9.6 01/03/2021 0916   PROT 6.3 (L) 02/28/2020 0539   ALBUMIN 3.6 02/28/2020 0539   AST 17 02/28/2020 0539   ALT 11 02/28/2020 0539   ALKPHOS 64 02/28/2020 0539   BILITOT 1.2 02/28/2020 0539   GFRNONAA 47 (L) 01/03/2021 0916   GFRAA 57 (L) 03/16/2020 1435    GFR Estimated Creatinine Clearance: 55.3 mL/min (A) (by C-G formula based on SCr of 1.26 mg/dL (H)). No results for input(s): LIPASE, AMYLASE in the last 72 hours. No results  for input(s): CKTOTAL, CKMB, CKMBINDEX, TROPONINI in the last 72 hours. Invalid input(s): POCBNP No results for input(s): DDIMER in the last 72 hours. Recent Labs    01/04/21 0225  HGBA1C 5.5    Recent Labs    01/04/21 0225  CHOL 200  HDL 49  LDLCALC 136*  TRIG 76  CHOLHDL 4.1    Recent Labs    01/04/21 0225  TSH 6.366*    No results for input(s): VITAMINB12, FOLATE, FERRITIN, TIBC, IRON, RETICCTPCT in the last 72 hours. Coags: No results for input(s): INR in the last 72 hours.  Invalid input(s): PT Microbiology: Recent Results (from the past 240 hour(s))  Resp Panel by RT-PCR (Flu A&B, Covid) Nasopharyngeal Swab     Status: None   Collection Time: 01/03/21  9:35 AM   Specimen: Nasopharyngeal Swab; Nasopharyngeal(NP) swabs in vial transport medium  Result Value Ref Range Status   SARS Coronavirus 2 by RT PCR NEGATIVE NEGATIVE Final    Comment: (NOTE) SARS-CoV-2 target nucleic acids are NOT DETECTED.  The SARS-CoV-2 RNA is generally detectable in upper respiratory specimens during the acute phase of infection. The lowest concentration of SARS-CoV-2 viral copies this assay can detect is 138 copies/mL. A negative result does not preclude SARS-Cov-2 infection and should not be used as the sole basis for treatment or other patient management decisions. A negative result may occur with  improper specimen collection/handling, submission of specimen other than nasopharyngeal swab, presence of viral mutation(s) within the areas targeted by this assay, and inadequate number of viral copies(<138 copies/mL). A negative result must be combined with clinical observations, patient history, and epidemiological information. The expected result is Negative.  Fact Sheet for Patients:  EntrepreneurPulse.com.au  Fact Sheet for Healthcare Providers:  IncredibleEmployment.be  This test is no t yet approved or cleared by the [place] FDA and   has been authorized for detection and/or diagnosis of SARS-CoV-2 by FDA under an Emergency Use Authorization (EUA). This EUA will remain  in effect (meaning this test can be used) for the duration of the COVID-19 declaration under Section 564(b)(1) of the Act, 21 U.S.C.section 360bbb-3(b)(1), unless the authorization is terminated  or revoked sooner.       Influenza A by PCR NEGATIVE NEGATIVE Final   Influenza B by PCR NEGATIVE NEGATIVE Final    Comment: (NOTE) The Xpert Xpress SARS-CoV-2/FLU/RSV plus assay is intended as an aid in the diagnosis of influenza from Nasopharyngeal swab specimens and should not be used as a sole basis for treatment. Nasal washings and aspirates are unacceptable for Xpert Xpress SARS-CoV-2/FLU/RSV testing.  Fact Sheet for Patients: EntrepreneurPulse.com.au  Fact Sheet for Healthcare Providers: IncredibleEmployment.be  This test is not yet approved or cleared by the [place] FDA and has been authorized for detection and/or  diagnosis of SARS-CoV-2 by FDA under an Emergency Use Authorization (EUA). This EUA will remain in effect (meaning this test can be used) for the duration of the COVID-19 declaration under Section 564(b)(1) of the Act, 21 U.S.C. section 360bbb-3(b)(1), unless the authorization is terminated or revoked.  Performed at [facility], [address] [place], [place] [address]     FURTHER DISCHARGE INSTRUCTIONS:  Get Medicines reviewed and adjusted: Please take all your medications with you for your next visit with your Primary MD  Laboratory/radiological data: Please request your Primary MD to go over all hospital tests and procedure/radiological results at the follow up, please ask your Primary MD to get all Hospital records sent to his/her office.  In some cases, they will be blood work, cultures and biopsy results pending at the time of your discharge. Please  request that your primary care M.D. goes through all the records of your hospital data and follows up on these results.  Also Note the following: If you experience worsening of your admission symptoms, develop shortness of breath, life threatening emergency, suicidal or homicidal thoughts you must seek medical attention immediately by calling 911 or calling your MD immediately  if symptoms less severe.  You must read complete instructions/literature along with all the possible adverse reactions/side effects for all the Medicines you take and that have been prescribed to you. Take any new Medicines after you have completely understood and accpet all the possible adverse reactions/side effects.   Do not drive when taking Pain medications or sleeping medications (Benzodaizepines)  Do not take more than prescribed Pain, Sleep and Anxiety Medications. It is not advisable to combine anxiety,sleep and pain medications without talking with your primary care practitioner  Special Instructions: If you have smoked or chewed Tobacco  in the last 2 yrs please stop smoking, stop any regular Alcohol  and or any Recreational drug use.  Wear Seat belts while driving.  Please note: You were cared for by a hospitalist during your hospital stay. Once you are discharged, your primary care physician will handle any further medical issues. Please note that NO REFILLS for any discharge medications will be authorized once you are discharged, as it is imperative that you return to your primary care physician (or establish a relationship with a primary care physician if you do not have one) for your post hospital discharge needs so that they can reassess your need for medications and monitor your lab values.  Total Time spent coordinating discharge including counseling, education and face to face time equals 35 minutes.  SignedOren Binet 01/05/2021 12:26 PM

## 2021-01-05 NOTE — Progress Notes (Signed)
PATIENT DETAILS Name: Cynthia Cook Age: 65 y.o. Sex: female Date of Birth: 22-Jun-1955 MRN: [id number]. Admitting Physician: Lorella Nimrod, MD [id number], Legrand Como, MD  Admit Date: 01/03/2021 Discharge date: 01/05/2021  Recommendations for Outpatient Follow-up:  Follow up with PCP in 1-2 weeks Please obtain CMP/CBC in one week Please ensure follow-up with stroke clinic, cardiology New medication: Eliquis.  Admitted From:  Home  Disposition: Home with home health services   [place] Yes  Equipment/Devices: None  Discharge Condition: Stable  CODE STATUS: FULL CODE  Diet recommendation:  Diet Order             Diet - low sodium heart healthy           Diet regular Room service appropriate? Yes with Assist; Fluid consistency: Thin  Diet effective now                    Brief Summary: See H&P, Labs, Consult and Test reports for all details in brief, patient is a 65 year old with a history of PAF, hypothyroidism, CKD stage IIIa-who presented with dysarthria/ facial droop-she was found to have acute CVA.  Stroke work-up: 9/27>> MRI brain: Subacute infarct in the left centrum semiovale 9/27>> MRI brain: Patent intracranial vasculature-no significant stenosis/occlusion. 9/27>> Echo: EF 55-73%, grade 1 diastolic dysfunction.  Mild to moderate MS. 9/27>> bilateral carotid Doppler: No significant stenosis. 9/27>> A1c: 5.5 9/27>> LDL: 136  Brief Hospital Course: Acute CVA: Still with some dysarthria-she has a history of PAF-and apparently in the past has been unable to afford Eliquis.  Evaluated by neurology-recommendations were to restart Eliquis if affordable-if not plan was to continue aspirin/Plavix x3 weeks followed by aspirin alone.  Case manager performed benefit check-her co-pay is $47 which patient claims she cannot afford-hence placing her on Eliquis on discharge.  Evaluated by rehab services-recommendations were for CIR-however patient refused-and wants to  go home to be with her dog.  Being discharged with maximal home health services (per nursing staff-walking to the bathroom with the help of a walker).  HLD: Continue high intensity steroids on discharge.  Hypothyroidism: TSH mildly elevated-continue current dosing of Synthroid-follow-up with PCP.  Gout: Stable-no flare-continue allopurinol  CKD stage IIIa: Creatinine close to baseline.  Morbid Obesity: Estimated body mass index is 40.35 kg/m as calculated from the following:   Height as of this encounter: 5\' 6"  (1.676 m).   Weight as of this encounter: 113.4 kg.     Procedures None  Discharge Diagnoses:  Principal Problem:   Acute CVA (cerebrovascular accident) ([place]) Active Problems:   Hypothyroidism   PAF (paroxysmal atrial fibrillation) (HCC)   Class 2 obesity due to excess calories without serious comorbidity with body mass index (BMI) of 36.0 to 36.9 in adult   CKD (chronic kidney disease) stage 3, GFR 30-59 ml/min (HCC) - baseline SCr 1.3   Stroke [facility])   Discharge Instructions:  Activity:  As tolerated with Full fall precautions use walker/cane & assistance as needed Discharge Instructions     Ambulatory referral to Neurology   Complete by: As directed    An appointment is requested in approximately: 8 weeks   Diet - low sodium heart healthy   Complete by: As directed    Discharge instructions   Complete by: As directed    Follow with Primary MD  Hilts, Michael, MD in 1-2 weeks  Please get a complete blood count and chemistry panel checked by your Primary MD at your next visit, and again as instructed  by your Primary MD.  Get Medicines reviewed and adjusted: Please take all your medications with you for your next visit with your Primary MD  Laboratory/radiological data: Please request your Primary MD to go over all hospital tests and procedure/radiological results at the follow up, please ask your Primary MD to get all Hospital records sent to his/her  office.  In some cases, they will be blood work, cultures and biopsy results pending at the time of your discharge. Please request that your primary care M.D. follows up on these results.  Also Note the following: If you experience worsening of your admission symptoms, develop shortness of breath, life threatening emergency, suicidal or homicidal thoughts you must seek medical attention immediately by calling 911 or calling your MD immediately  if symptoms less severe.  You must read complete instructions/literature along with all the possible adverse reactions/side effects for all the Medicines you take and that have been prescribed to you. Take any new Medicines after you have completely understood and accpet all the possible adverse reactions/side effects.   Do not drive when taking Pain medications or sleeping medications (Benzodaizepines)  Do not take more than prescribed Pain, Sleep and Anxiety Medications. It is not advisable to combine anxiety,sleep and pain medications without talking with your primary care practitioner  Special Instructions: If you have smoked or chewed Tobacco  in the last 2 yrs please stop smoking, stop any regular Alcohol  and or any Recreational drug use.  Wear Seat belts while driving.  Please note: You were cared for by a hospitalist during your hospital stay. Once you are discharged, your primary care physician will handle any further medical issues. Please note that NO REFILLS for any discharge medications will be authorized once you are discharged, as it is imperative that you return to your primary care physician (or establish a relationship with a primary care physician if you do not have one) for your post hospital discharge needs so that they can reassess your need for medications and monitor your lab values.   Increase activity slowly   Complete by: As directed       Allergies as of 01/05/2021       Reactions   Codeine Nausea Only   Fentanyl Nausea  And Vomiting   Dizziness, lightheaded         Medication List     STOP taking these medications    febuxostat 40 MG tablet Commonly known as: ULORIC   NATTOKINASE PO       TAKE these medications    allopurinol 100 MG tablet Commonly known as: Zyloprim Take 2 tablets (200 mg total) by mouth daily. What changed: how much to take   apixaban 5 MG Tabs tablet Commonly known as: ELIQUIS Take 1 tablet (5 mg total) by mouth 2 (two) times daily.   ascorbic acid 500 MG tablet Commonly known as: VITAMIN C Take 500 mg by mouth daily.   atorvastatin 40 MG tablet Commonly known as: LIPITOR Take 1 tablet (40 mg total) by mouth daily. Start taking on: January 06, 2021   levothyroxine 50 MCG tablet Commonly known as: Synthroid Take 1 tablet (50 mcg total) by mouth daily before breakfast.   SAM-e 200 MG Tabs Take 1 tablet (200 mg total) by mouth in the morning and at bedtime.   triamcinolone cream 0.1 % Commonly known as: KENALOG Apply 1 application topically daily as needed (rash).   vitamin E 180 MG (400 UNITS) capsule Take 400 Units by mouth  daily.   VITAMIN K2-VITAMIN D3 PO Take 1 tablet by mouth daily.               Durable Medical Equipment  (From admission, onward)           Start     Ordered   01/05/21 1051  For home use only DME Shower stool  Once        01/05/21 1051   01/05/21 0850  For home use only DME Walker rolling  Once       Question Answer Comment  Walker: With [place] Wheels   Patient needs a walker to treat with the following condition Physical deconditioning      01/05/21 0850   01/05/21 0850  For home use only DME 3 n 1  Once        01/05/21 0850            Follow-up Information     Hilts, Michael, MD. Schedule an appointment as soon as possible for a visit in 1 week(s).   Specialty: Family Medicine Contact information: [place] [address] [phone]         Buford Dresser, MD Follow  up in 2 week(s).   Specialty: Cardiology Contact information: [address] [place] [address] [phone]         Garvin Fila, MD Follow up.   Specialties: Neurology, Radiology Why: Office will call with date/time, If you dont hear from them,please give them a call Contact information: [address] [place] [address] [phone]                Allergies  Allergen Reactions   Codeine Nausea Only   Fentanyl Nausea And Vomiting    Dizziness, lightheaded       Consultations: Neurology   Other Procedures/Studies: CT HEAD WO CONTRAST (5MM)  Result Date: 01/03/2021 CLINICAL DATA:  65 year old female with abnormal speech since yesterday. EXAM: CT HEAD WITHOUT CONTRAST TECHNIQUE: Contiguous axial images were obtained from the base of the skull through the vertex without intravenous contrast. COMPARISON:  Head CT 02/05/2020. FINDINGS: Brain: Stable cerebral volume. No midline shift, ventriculomegaly, mass effect, evidence of mass lesion, acute intracranial hemorrhage. Patchy bilateral cerebral white matter hypodensity. New focal hypodensity at the posterior left external capsule since last year on series 2, image 14. And left superior frontal lobe subcortical and possibly cortical encephalomalacia is new or progressed since last year on coronal image 34. Small chronic right cerebellar infarct is stable. But there are new small hypodense infarcts in the left superior cerebellum on series 4, image 19. ASPECTS 9 (abnormal left M5). Vascular: Extensive Calcified atherosclerosis at the skull base. Skull: Stable, negative. Sinuses/Orbits: Visualized paranasal sinuses and mastoids are stable and well aerated. Other: Visualized orbits and scalp soft tissues are within normal limits. IMPRESSION: 1. Progression of small to medium-sized vessel ischemic changes in both the Left MCA and Left superior cerebellar artery territories since last year. Hypodensity  in those areas is age indeterminate. 2. Underlying chronic small vessel disease. No acute intracranial hemorrhage or mass effect. Electronically Signed   By: Genevie Ann M.D.   On: 01/03/2021 09:54   MR ANGIO HEAD WO CONTRAST  Result Date: 01/04/2021 CLINICAL DATA:  Three day history of dysarthria and mild facial droop EXAM: MRI HEAD WITHOUT CONTRAST MRA HEAD WITHOUT CONTRAST TECHNIQUE: Multiplanar, multi-echo pulse sequences of the brain and surrounding structures were acquired without intravenous contrast. Angiographic images of  the Circle of Willis were acquired using MRA technique without intravenous contrast. COMPARISON:  Noncontrast CT head dated 1 day prior FINDINGS: MRI HEAD FINDINGS Brain: There is focal diffusion restriction with associated T2/FLAIR signal abnormality in the left centrum semiovale consistent with evolving early subacute infarct. There is no evidence of hemorrhagic transformation. There is no other acute infarct there are small remote lacunar infarcts in the bilateral basal ganglia and cerebellar hemispheres. There is no evidence of acute intracranial hemorrhage. There is a single punctate chronic microhemorrhage in the right centrum semiovale, nonspecific. There is mild global parenchymal volume loss with commensurate enlargement of the ventricular system. Additional foci of FLAIR signal abnormality in the subcortical and periventricular white matter are nonspecific but likely reflects sequela of chronic white matter microangiopathy. There is no mass lesion.  There is no midline shift. Vascular: The major flow voids are present. The vascularity is better assessed on the MRI described below. Skull and upper cervical spine: Normal marrow signal. Sinuses/Orbits: The paranasal sinuses are clear. The globes and orbits are unremarkable. Other: None. MRA HEAD FINDINGS Anterior circulation: The bilateral intracranial ICAs are patent. The bilateral MCAs and ACAs are patent. There is no  hemodynamically significant stenosis or occlusion. There is no aneurysm. Posterior circulation: The bilateral V4 segments are patent. The basilar artery is patent. The bilateral PCAs are patent. There is no hemodynamically significant stenosis or occlusion. There is no aneurysm. Anatomic variants: None. IMPRESSION: 1. Small early subacute infarct in the left centrum semiovale. 2. Patent intracranial vasculature, with no hemodynamically significant stenosis or occlusion. 3. Global parenchymal volume loss and chronic white matter microangiopathy. 4. Remote lacunar infarcts in the bilateral basal ganglia and cerebellar hemispheres. Electronically Signed   By: Valetta Mole M.D.   On: 01/04/2021 08:37   MR BRAIN WO CONTRAST  Result Date: 01/04/2021 CLINICAL DATA:  Three day history of dysarthria and mild facial droop EXAM: MRI HEAD WITHOUT CONTRAST MRA HEAD WITHOUT CONTRAST TECHNIQUE: Multiplanar, multi-echo pulse sequences of the brain and surrounding structures were acquired without intravenous contrast. Angiographic images of the Circle of Willis were acquired using MRA technique without intravenous contrast. COMPARISON:  Noncontrast CT head dated 1 day prior FINDINGS: MRI HEAD FINDINGS Brain: There is focal diffusion restriction with associated T2/FLAIR signal abnormality in the left centrum semiovale consistent with evolving early subacute infarct. There is no evidence of hemorrhagic transformation. There is no other acute infarct there are small remote lacunar infarcts in the bilateral basal ganglia and cerebellar hemispheres. There is no evidence of acute intracranial hemorrhage. There is a single punctate chronic microhemorrhage in the right centrum semiovale, nonspecific. There is mild global parenchymal volume loss with commensurate enlargement of the ventricular system. Additional foci of FLAIR signal abnormality in the subcortical and periventricular white matter are nonspecific but likely reflects  sequela of chronic white matter microangiopathy. There is no mass lesion.  There is no midline shift. Vascular: The major flow voids are present. The vascularity is better assessed on the MRI described below. Skull and upper cervical spine: Normal marrow signal. Sinuses/Orbits: The paranasal sinuses are clear. The globes and orbits are unremarkable. Other: None. MRA HEAD FINDINGS Anterior circulation: The bilateral intracranial ICAs are patent. The bilateral MCAs and ACAs are patent. There is no hemodynamically significant stenosis or occlusion. There is no aneurysm. Posterior circulation: The bilateral V4 segments are patent. The basilar artery is patent. The bilateral PCAs are patent. There is no hemodynamically significant stenosis or occlusion. There is no aneurysm. Anatomic  variants: None. IMPRESSION: 1. Small early subacute infarct in the left centrum semiovale. 2. Patent intracranial vasculature, with no hemodynamically significant stenosis or occlusion. 3. Global parenchymal volume loss and chronic white matter microangiopathy. 4. Remote lacunar infarcts in the bilateral basal ganglia and cerebellar hemispheres. Electronically Signed   By: Valetta Mole M.D.   On: 01/04/2021 08:37   ECHOCARDIOGRAM COMPLETE BUBBLE STUDY  Result Date: 01/04/2021    ECHOCARDIOGRAM REPORT   Patient Name:   KAMERIN AXFORD Date of Exam: 01/04/2021 Medical Rec #:  [id number]      Height:       66.0 in Accession #:    [id number]     Weight:       250.0 lb Date of Birth:  October 21, 1955       BSA:          2.199 m Patient Age:    30 years       BP:           141/90 mmHg Patient Gender: F              HR:           88 bpm. Exam Location:  Inpatient Procedure: 2D Echo, Color Doppler and Cardiac Doppler Indications:    Stroke i63.9  History:        Patient has prior history of Echocardiogram examinations, most                 recent 02/06/2020. Arrythmias:Atrial Fibrillation.  Sonographer:    Raquel Sarna Senior RDCS Referring Phys: [place]  1. Left ventricular ejection fraction, by estimation, is 55 to 60%. The left ventricle has normal function. The left ventricle has no regional wall motion abnormalities. Left ventricular diastolic parameters are consistent with Grade I diastolic dysfunction (impaired relaxation).  2. Right ventricular systolic function is normal. The right ventricular size is normal.  3. Left atrial size was moderately dilated.  4. The mitral valve is degenerative. Moderate mitral valve regurgitation. Mild to moderate mitral stenosis. The mean mitral valve gradient is 5.7 mmHg. Severe mitral annular calcification.  5. The aortic valve is calcified. There is moderate calcification of the aortic valve. There is moderate thickening of the aortic valve. Aortic valve regurgitation is trivial. Mild to moderate aortic valve stenosis. Aortic valve area, by VTI measures 1.19 cm. Aortic valve mean gradient measures 17.5 mmHg. Aortic valve Vmax measures 2.72 m/s.  6. Aortic dilatation noted. There is mild dilatation of the ascending aorta, measuring 41 mm.  7. The inferior vena cava is normal in size with greater than 50% respiratory variability, suggesting right atrial pressure of 3 mmHg.  8. Agitated saline contrast bubble study was negative, with no evidence of any interatrial shunt. Comparison(s): No significant change from prior study. Prior images reviewed side by side. Conclusion(s)/Recommendation(s): No intracardiac source of embolism detected on this transthoracic study. A transesophageal echocardiogram is recommended to exclude cardiac source of embolism if clinically indicated. FINDINGS  Left Ventricle: Left ventricular ejection fraction, by estimation, is 55 to 60%. The left ventricle has normal function. The left ventricle has no regional wall motion abnormalities. The left ventricular internal cavity size was normal in size. There is  no left ventricular hypertrophy. Left ventricular diastolic parameters are  consistent with Grade I diastolic dysfunction (impaired relaxation). Right Ventricle: The right ventricular size is normal. No increase in right ventricular wall thickness. Right ventricular systolic function is normal. Left Atrium: Left atrial size was moderately dilated.  Right Atrium: Right atrial size was normal in size. Pericardium: There is no evidence of pericardial effusion. Mitral Valve: The mitral valve is degenerative in appearance. There is severe thickening of the mitral valve leaflet(s). There is severe calcification of the mitral valve leaflet(s). Severe mitral annular calcification. Moderate mitral valve regurgitation. Mild to moderate mitral valve stenosis. MV peak gradient, 11.2 mmHg. The mean mitral valve gradient is 5.7 mmHg. Tricuspid Valve: The tricuspid valve is normal in structure. Tricuspid valve regurgitation is not demonstrated. No evidence of tricuspid stenosis. Aortic Valve: The aortic valve is calcified. There is moderate calcification of the aortic valve. There is moderate thickening of the aortic valve. Aortic valve regurgitation is trivial. Mild to moderate aortic stenosis is present. Aortic valve mean gradient measures 17.5 mmHg. Aortic valve peak gradient measures 29.6 mmHg. Aortic valve area, by VTI measures 1.19 cm. Pulmonic Valve: The pulmonic valve was normal in structure. Pulmonic valve regurgitation is not visualized. No evidence of pulmonic stenosis. Aorta: Aortic dilatation noted. There is mild dilatation of the ascending aorta, measuring 41 mm. Venous: The inferior vena cava is normal in size with greater than 50% respiratory variability, suggesting right atrial pressure of 3 mmHg. IAS/Shunts: No atrial level shunt detected by color flow Doppler. Agitated saline contrast was given intravenously to evaluate for intracardiac shunting. Agitated saline contrast bubble study was negative, with no evidence of any interatrial shunt.  LEFT VENTRICLE PLAX 2D LVIDd:         4.40 cm   Diastology LVIDs:         3.20 cm  LV e' medial:    3.59 cm/s LV PW:         1.20 cm  LV E/e' medial:  49.9 LV IVS:        1.10 cm  LV e' lateral:   6.53 cm/s LVOT diam:     2.00 cm  LV E/e' lateral: 27.4 LV SV:         67 LV SV Index:   30 LVOT Area:     3.14 cm  RIGHT VENTRICLE RV S prime:     9.90 cm/s TAPSE (M-mode): 1.6 cm LEFT ATRIUM             Index       RIGHT ATRIUM           Index LA diam:        4.80 cm 2.18 cm/m  RA Area:     15.90 cm LA Vol (A2C):   91.8 ml 41.75 ml/m RA Volume:   42.80 ml  19.47 ml/m LA Vol (A4C):   92.4 ml 42.02 ml/m LA Biplane Vol: 93.5 ml 42.52 ml/m  AORTIC VALVE AV Area (Vmax):    0.99 cm AV Area (Vmean):   1.02 cm AV Area (VTI):     1.19 cm AV Vmax:           272.00 cm/s AV Vmean:          197.000 cm/s AV VTI:            0.563 m AV Peak Grad:      29.6 mmHg AV Mean Grad:      17.5 mmHg LVOT Vmax:         85.80 cm/s LVOT Vmean:        63.900 cm/s LVOT VTI:          0.213 m LVOT/AV VTI ratio: 0.38  AORTA Ao Root diam: 3.30 cm Ao Asc diam:  4.10 cm MITRAL VALVE MV Area (PHT): 3.34 cm      SHUNTS MV Area VTI:   2.05 cm      Systemic VTI:  0.21 m MV Peak grad:  11.2 mmHg     Systemic Diam: 2.00 cm MV Mean grad:  5.7 mmHg MV Vmax:       1.68 m/s MV Vmean:      106.7 cm/s MV Decel Time: 227 msec MR Peak grad:    166.2 mmHg MR Mean grad:    96.0 mmHg MR Vmax:         644.50 cm/s MR Vmean:        459.5 cm/s MR PISA:         4.02 cm MR PISA Eff ROA: 22 mm MR PISA Radius:  0.80 cm MV E velocity: 179.00 cm/s MV A velocity: 110.00 cm/s MV E/A ratio:  1.63 Candee Furbish MD Electronically signed by Candee Furbish MD Signature Date/Time: 01/04/2021/1:42:55 PM    Final    VAS US CAROTID  Result Date: 01/04/2021 Carotid Arterial Duplex Study Patient Name:  TERRINA DOCTER  Date of Exam:   01/04/2021 Medical Rec #: [id number]       Accession #:    [id number] Date of Birth: [birth year]        Patient Gender: F Patient Age:   57 years Exam Location:  [facility] Procedure:      VAS US  CAROTID Referring Phys: ERIC CHEN --------------------------------------------------------------------------------  Indications:   CVA. Other Factors: Persistent atrial fibrillation. Performing Technologist: Maudry Mayhew MHA, RDMS, RVT, RDCS  Examination Guidelines: A complete evaluation includes B-mode imaging, spectral Doppler, color Doppler, and power Doppler as needed of all accessible portions of each vessel. Bilateral testing is considered an integral part of a complete examination. Limited examinations for reoccurring indications may be performed as noted.  Right Carotid Findings: +----------+--------+--------+--------+--------------------------+--------+           PSV cm/sEDV cm/sStenosisPlaque Description        Comments +----------+--------+--------+--------+--------------------------+--------+ CCA Prox  75      10              smooth and heterogenous            +----------+--------+--------+--------+--------------------------+--------+ CCA Distal83      12              heterogenous and irregular         +----------+--------+--------+--------+--------------------------+--------+ ICA Prox  83      22                                                 +----------+--------+--------+--------+--------------------------+--------+ ICA Distal60      16                                                 +----------+--------+--------+--------+--------------------------+--------+ ECA       93                                                         +----------+--------+--------+--------+--------------------------+--------+ +----------+--------+-------+----------------+-------------------+  PSV cm/sEDV cmsDescribe        Arm Pressure (mmHG) +----------+--------+-------+----------------+-------------------+ [id number]             Multiphasic, WNL                    +----------+--------+-------+----------------+-------------------+  +---------+--------+--+--------+-+---------+ VertebralPSV cm/s27EDV cm/s7Antegrade +---------+--------+--+--------+-+---------+  Left Carotid Findings: +----------+--------+--------+--------+--------------------------+---------+           PSV cm/sEDV cm/sStenosisPlaque Description        Comments  +----------+--------+--------+--------+--------------------------+---------+ CCA Prox  73      10              smooth and heterogenous             +----------+--------+--------+--------+--------------------------+---------+ CCA Distal54      13              heterogenous and irregular          +----------+--------+--------+--------+--------------------------+---------+ ICA Prox  60      11              irregular and calcific    Shadowing +----------+--------+--------+--------+--------------------------+---------+ ICA Distal56      14                                                  +----------+--------+--------+--------+--------------------------+---------+ ECA       [address]                                                         +----------+--------+--------+--------+--------------------------+---------+ +----------+--------+--------+----------------+-------------------+           PSV cm/sEDV cm/sDescribe        Arm Pressure (mmHG) +----------+--------+--------+----------------+-------------------+ [id number]              Multiphasic, WNL                    +----------+--------+--------+----------------+-------------------+ +---------+--------+--+--------+--+---------+ VertebralPSV cm/s46EDV cm/s10Antegrade +---------+--------+--+--------+--+---------+   Summary: Right Carotid: Velocities in the right ICA are consistent with a 1-39% stenosis. Left Carotid: Velocities in the left ICA are consistent with a 1-39% stenosis. Vertebrals:  Bilateral vertebral arteries demonstrate antegrade flow. Subclavians: Normal flow hemodynamics were seen in bilateral  subclavian              arteries. *See table(s) above for measurements and observations.  Electronically signed by Deitra Mayo MD on 01/04/2021 at 11:55:55 AM.    Final      TODAY-DAY OF DISCHARGE:  Subjective:   Vaudine Dutan today has no headache,no chest abdominal pain,no new weakness tingling or numbness, feels much better wants to go home today.   Objective:   Blood pressure 135/81, pulse 82, temperature 98.2 F (36.8 C), resp. rate 18, height 5\' 6"  (1.676 m), weight 113.4 kg, SpO2 98 %. No intake or output data in the 24 hours ending 01/05/21 1056 Filed Weights   01/03/21 0905  Weight: 113.4 kg    Exam: Awake Alert, Oriented *3, No new F.N deficits, Normal affect [place].AT,PERRAL Supple Neck,No JVD, No cervical lymphadenopathy appriciated.  Symmetrical Chest wall movement, Good air movement bilaterally, CTAB RRR,No Gallops,Rubs or new Murmurs, No Parasternal Heave +ve B.Sounds, Abd Soft, Non tender, No organomegaly appriciated, No rebound -guarding or rigidity.  No Cyanosis, Clubbing or edema, No new Rash or bruise   PERTINENT RADIOLOGIC STUDIES: MR ANGIO HEAD WO CONTRAST  Result Date: 01/04/2021 CLINICAL DATA:  Three day history of dysarthria and mild facial droop EXAM: MRI HEAD WITHOUT CONTRAST MRA HEAD WITHOUT CONTRAST TECHNIQUE: Multiplanar, multi-echo pulse sequences of the brain and surrounding structures were acquired without intravenous contrast. Angiographic images of the Circle of Willis were acquired using MRA technique without intravenous contrast. COMPARISON:  Noncontrast CT head dated 1 day prior FINDINGS: MRI HEAD FINDINGS Brain: There is focal diffusion restriction with associated T2/FLAIR signal abnormality in the left centrum semiovale consistent with evolving early subacute infarct. There is no evidence of hemorrhagic transformation. There is no other acute infarct there are small remote lacunar infarcts in the bilateral basal ganglia and cerebellar  hemispheres. There is no evidence of acute intracranial hemorrhage. There is a single punctate chronic microhemorrhage in the right centrum semiovale, nonspecific. There is mild global parenchymal volume loss with commensurate enlargement of the ventricular system. Additional foci of FLAIR signal abnormality in the subcortical and periventricular white matter are nonspecific but likely reflects sequela of chronic white matter microangiopathy. There is no mass lesion.  There is no midline shift. Vascular: The major flow voids are present. The vascularity is better assessed on the MRI described below. Skull and upper cervical spine: Normal marrow signal. Sinuses/Orbits: The paranasal sinuses are clear. The globes and orbits are unremarkable. Other: None. MRA HEAD FINDINGS Anterior circulation: The bilateral intracranial ICAs are patent. The bilateral MCAs and ACAs are patent. There is no hemodynamically significant stenosis or occlusion. There is no aneurysm. Posterior circulation: The bilateral V4 segments are patent. The basilar artery is patent. The bilateral PCAs are patent. There is no hemodynamically significant stenosis or occlusion. There is no aneurysm. Anatomic variants: None. IMPRESSION: 1. Small early subacute infarct in the left centrum semiovale. 2. Patent intracranial vasculature, with no hemodynamically significant stenosis or occlusion. 3. Global parenchymal volume loss and chronic white matter microangiopathy. 4. Remote lacunar infarcts in the bilateral basal ganglia and cerebellar hemispheres. Electronically Signed   By: Valetta Mole M.D.   On: 01/04/2021 08:37   MR BRAIN WO CONTRAST  Result Date: 01/04/2021 CLINICAL DATA:  Three day history of dysarthria and mild facial droop EXAM: MRI HEAD WITHOUT CONTRAST MRA HEAD WITHOUT CONTRAST TECHNIQUE: Multiplanar, multi-echo pulse sequences of the brain and surrounding structures were acquired without intravenous contrast. Angiographic images of the  Circle of Willis were acquired using MRA technique without intravenous contrast. COMPARISON:  Noncontrast CT head dated 1 day prior FINDINGS: MRI HEAD FINDINGS Brain: There is focal diffusion restriction with associated T2/FLAIR signal abnormality in the left centrum semiovale consistent with evolving early subacute infarct. There is no evidence of hemorrhagic transformation. There is no other acute infarct there are small remote lacunar infarcts in the bilateral basal ganglia and cerebellar hemispheres. There is no evidence of acute intracranial hemorrhage. There is a single punctate chronic microhemorrhage in the right centrum semiovale, nonspecific. There is mild global parenchymal volume loss with commensurate enlargement of the ventricular system. Additional foci of FLAIR signal abnormality in the subcortical and periventricular white matter are nonspecific but likely reflects sequela of chronic white matter microangiopathy. There is no mass lesion.  There is no midline shift. Vascular: The major flow voids are present. The vascularity is better assessed on the MRI described below. Skull and upper cervical spine: Normal marrow signal. Sinuses/Orbits: The paranasal sinuses are clear. The globes and orbits are  unremarkable. Other: None. MRA HEAD FINDINGS Anterior circulation: The bilateral intracranial ICAs are patent. The bilateral MCAs and ACAs are patent. There is no hemodynamically significant stenosis or occlusion. There is no aneurysm. Posterior circulation: The bilateral V4 segments are patent. The basilar artery is patent. The bilateral PCAs are patent. There is no hemodynamically significant stenosis or occlusion. There is no aneurysm. Anatomic variants: None. IMPRESSION: 1. Small early subacute infarct in the left centrum semiovale. 2. Patent intracranial vasculature, with no hemodynamically significant stenosis or occlusion. 3. Global parenchymal volume loss and chronic white matter microangiopathy. 4.  Remote lacunar infarcts in the bilateral basal ganglia and cerebellar hemispheres. Electronically Signed   By: Valetta Mole M.D.   On: 01/04/2021 08:37   ECHOCARDIOGRAM COMPLETE BUBBLE STUDY  Result Date: 01/04/2021    ECHOCARDIOGRAM REPORT   Patient Name:   SHENAYA LEBO Date of Exam: 01/04/2021 Medical Rec #:  [id number]      Height:       66.0 in Accession #:    [id number]     Weight:       250.0 lb Date of Birth:  March 03, 1956       BSA:          2.199 m Patient Age:    70 years       BP:           141/90 mmHg Patient Gender: F              HR:           88 bpm. Exam Location:  Inpatient Procedure: 2D Echo, Color Doppler and Cardiac Doppler Indications:    Stroke i63.9  History:        Patient has prior history of Echocardiogram examinations, most                 recent 02/06/2020. Arrythmias:Atrial Fibrillation.  Sonographer:    Raquel Sarna Senior RDCS Referring Phys: [place]  1. Left ventricular ejection fraction, by estimation, is 55 to 60%. The left ventricle has normal function. The left ventricle has no regional wall motion abnormalities. Left ventricular diastolic parameters are consistent with Grade I diastolic dysfunction (impaired relaxation).  2. Right ventricular systolic function is normal. The right ventricular size is normal.  3. Left atrial size was moderately dilated.  4. The mitral valve is degenerative. Moderate mitral valve regurgitation. Mild to moderate mitral stenosis. The mean mitral valve gradient is 5.7 mmHg. Severe mitral annular calcification.  5. The aortic valve is calcified. There is moderate calcification of the aortic valve. There is moderate thickening of the aortic valve. Aortic valve regurgitation is trivial. Mild to moderate aortic valve stenosis. Aortic valve area, by VTI measures 1.19 cm. Aortic valve mean gradient measures 17.5 mmHg. Aortic valve Vmax measures 2.72 m/s.  6. Aortic dilatation noted. There is mild dilatation of the ascending aorta, measuring 41  mm.  7. The inferior vena cava is normal in size with greater than 50% respiratory variability, suggesting right atrial pressure of 3 mmHg.  8. Agitated saline contrast bubble study was negative, with no evidence of any interatrial shunt. Comparison(s): No significant change from prior study. Prior images reviewed side by side. Conclusion(s)/Recommendation(s): No intracardiac source of embolism detected on this transthoracic study. A transesophageal echocardiogram is recommended to exclude cardiac source of embolism if clinically indicated. FINDINGS  Left Ventricle: Left ventricular ejection fraction, by estimation, is 55 to 60%. The left ventricle has normal function. The left ventricle has  no regional wall motion abnormalities. The left ventricular internal cavity size was normal in size. There is  no left ventricular hypertrophy. Left ventricular diastolic parameters are consistent with Grade I diastolic dysfunction (impaired relaxation). Right Ventricle: The right ventricular size is normal. No increase in right ventricular wall thickness. Right ventricular systolic function is normal. Left Atrium: Left atrial size was moderately dilated. Right Atrium: Right atrial size was normal in size. Pericardium: There is no evidence of pericardial effusion. Mitral Valve: The mitral valve is degenerative in appearance. There is severe thickening of the mitral valve leaflet(s). There is severe calcification of the mitral valve leaflet(s). Severe mitral annular calcification. Moderate mitral valve regurgitation. Mild to moderate mitral valve stenosis. MV peak gradient, 11.2 mmHg. The mean mitral valve gradient is 5.7 mmHg. Tricuspid Valve: The tricuspid valve is normal in structure. Tricuspid valve regurgitation is not demonstrated. No evidence of tricuspid stenosis. Aortic Valve: The aortic valve is calcified. There is moderate calcification of the aortic valve. There is moderate thickening of the aortic valve. Aortic valve  regurgitation is trivial. Mild to moderate aortic stenosis is present. Aortic valve mean gradient measures 17.5 mmHg. Aortic valve peak gradient measures 29.6 mmHg. Aortic valve area, by VTI measures 1.19 cm. Pulmonic Valve: The pulmonic valve was normal in structure. Pulmonic valve regurgitation is not visualized. No evidence of pulmonic stenosis. Aorta: Aortic dilatation noted. There is mild dilatation of the ascending aorta, measuring 41 mm. Venous: The inferior vena cava is normal in size with greater than 50% respiratory variability, suggesting right atrial pressure of 3 mmHg. IAS/Shunts: No atrial level shunt detected by color flow Doppler. Agitated saline contrast was given intravenously to evaluate for intracardiac shunting. Agitated saline contrast bubble study was negative, with no evidence of any interatrial shunt.  LEFT VENTRICLE PLAX 2D LVIDd:         4.40 cm  Diastology LVIDs:         3.20 cm  LV e' medial:    3.59 cm/s LV PW:         1.20 cm  LV E/e' medial:  49.9 LV IVS:        1.10 cm  LV e' lateral:   6.53 cm/s LVOT diam:     2.00 cm  LV E/e' lateral: 27.4 LV SV:         67 LV SV Index:   30 LVOT Area:     3.14 cm  RIGHT VENTRICLE RV S prime:     9.90 cm/s TAPSE (M-mode): 1.6 cm LEFT ATRIUM             Index       RIGHT ATRIUM           Index LA diam:        4.80 cm 2.18 cm/m  RA Area:     15.90 cm LA Vol (A2C):   91.8 ml 41.75 ml/m RA Volume:   42.80 ml  19.47 ml/m LA Vol (A4C):   92.4 ml 42.02 ml/m LA Biplane Vol: 93.5 ml 42.52 ml/m  AORTIC VALVE AV Area (Vmax):    0.99 cm AV Area (Vmean):   1.02 cm AV Area (VTI):     1.19 cm AV Vmax:           272.00 cm/s AV Vmean:          197.000 cm/s AV VTI:            0.563 m AV Peak Grad:  29.6 mmHg AV Mean Grad:      17.5 mmHg LVOT Vmax:         85.80 cm/s LVOT Vmean:        63.900 cm/s LVOT VTI:          0.213 m LVOT/AV VTI ratio: 0.38  AORTA Ao Root diam: 3.30 cm Ao Asc diam:  4.10 cm MITRAL VALVE MV Area (PHT): 3.34 cm      SHUNTS MV  Area VTI:   2.05 cm      Systemic VTI:  0.21 m MV Peak grad:  11.2 mmHg     Systemic Diam: 2.00 cm MV Mean grad:  5.7 mmHg MV Vmax:       1.68 m/s MV Vmean:      106.7 cm/s MV Decel Time: 227 msec MR Peak grad:    166.2 mmHg MR Mean grad:    96.0 mmHg MR Vmax:         644.50 cm/s MR Vmean:        459.5 cm/s MR PISA:         4.02 cm MR PISA Eff ROA: 22 mm MR PISA Radius:  0.80 cm MV E velocity: 179.00 cm/s MV A velocity: 110.00 cm/s MV E/A ratio:  1.63 Candee Furbish MD Electronically signed by Candee Furbish MD Signature Date/Time: 01/04/2021/1:42:55 PM    Final    VAS US CAROTID  Result Date: 01/04/2021 Carotid Arterial Duplex Study Patient Name:  BIRDENA KINGMA  Date of Exam:   01/04/2021 Medical Rec #: [id number]       Accession #:    [id number] Date of Birth: [birth year]        Patient Gender: F Patient Age:   18 years Exam Location:  [facility] Procedure:      VAS US CAROTID Referring Phys: ERIC CHEN --------------------------------------------------------------------------------  Indications:   CVA. Other Factors: Persistent atrial fibrillation. Performing Technologist: Maudry Mayhew MHA, RDMS, RVT, RDCS  Examination Guidelines: A complete evaluation includes B-mode imaging, spectral Doppler, color Doppler, and power Doppler as needed of all accessible portions of each vessel. Bilateral testing is considered an integral part of a complete examination. Limited examinations for reoccurring indications may be performed as noted.  Right Carotid Findings: +----------+--------+--------+--------+--------------------------+--------+           PSV cm/sEDV cm/sStenosisPlaque Description        Comments +----------+--------+--------+--------+--------------------------+--------+ CCA Prox  75      10              smooth and heterogenous            +----------+--------+--------+--------+--------------------------+--------+ CCA Distal83      12              heterogenous and irregular          +----------+--------+--------+--------+--------------------------+--------+ ICA Prox  83      22                                                 +----------+--------+--------+--------+--------------------------+--------+ ICA Distal60      16                                                 +----------+--------+--------+--------+--------------------------+--------+ ECA  93                                                         +----------+--------+--------+--------+--------------------------+--------+ +----------+--------+-------+----------------+-------------------+           PSV cm/sEDV cmsDescribe        Arm Pressure (mmHG) +----------+--------+-------+----------------+-------------------+ [id number]             Multiphasic, WNL                    +----------+--------+-------+----------------+-------------------+ +---------+--------+--+--------+-+---------+ VertebralPSV cm/s27EDV cm/s7Antegrade +---------+--------+--+--------+-+---------+  Left Carotid Findings: +----------+--------+--------+--------+--------------------------+---------+           PSV cm/sEDV cm/sStenosisPlaque Description        Comments  +----------+--------+--------+--------+--------------------------+---------+ CCA Prox  73      10              smooth and heterogenous             +----------+--------+--------+--------+--------------------------+---------+ CCA Distal54      13              heterogenous and irregular          +----------+--------+--------+--------+--------------------------+---------+ ICA Prox  60      11              irregular and calcific    Shadowing +----------+--------+--------+--------+--------------------------+---------+ ICA Distal56      14                                                  +----------+--------+--------+--------+--------------------------+---------+ ECA       101                                                          +----------+--------+--------+--------+--------------------------+---------+ +----------+--------+--------+----------------+-------------------+           PSV cm/sEDV cm/sDescribe        Arm Pressure (mmHG) +----------+--------+--------+----------------+-------------------+ [id number]              Multiphasic, WNL                    +----------+--------+--------+----------------+-------------------+ +---------+--------+--+--------+--+---------+ VertebralPSV cm/s46EDV cm/s10Antegrade +---------+--------+--+--------+--+---------+   Summary: Right Carotid: Velocities in the right ICA are consistent with a 1-39% stenosis. Left Carotid: Velocities in the left ICA are consistent with a 1-39% stenosis. Vertebrals:  Bilateral vertebral arteries demonstrate antegrade flow. Subclavians: Normal flow hemodynamics were seen in bilateral subclavian              arteries. *See table(s) above for measurements and observations.  Electronically signed by Deitra Mayo MD on 01/04/2021 at 11:55:55 AM.    Final      PERTINENT LAB RESULTS: CBC: Recent Labs    01/03/21 0916  WBC 6.8  HGB 12.1  HCT 37.2  PLT 157   CMET CMP     Component Value Date/Time   NA 143 01/03/2021 0916   NA 144 03/16/2020 1435   K 4.3 01/03/2021 0916   CL 105 01/03/2021 0916   CO2 30 01/03/2021 0916  GLUCOSE 116 (H) 01/03/2021 0916   BUN 35 (H) 01/03/2021 0916   BUN 19 03/16/2020 1435   CREATININE 1.26 (H) 01/03/2021 0916   CREATININE 1.32 (H) 11/02/2020 0859   CALCIUM 9.6 01/03/2021 0916   PROT 6.3 (L) 02/28/2020 0539   ALBUMIN 3.6 02/28/2020 0539   AST 17 02/28/2020 0539   ALT 11 02/28/2020 0539   ALKPHOS 64 02/28/2020 0539   BILITOT 1.2 02/28/2020 0539   GFRNONAA 47 (L) 01/03/2021 0916   GFRAA 57 (L) 03/16/2020 1435    GFR Estimated Creatinine Clearance: 55.3 mL/min (A) (by C-G formula based on SCr of 1.26 mg/dL (H)). No results for input(s): LIPASE, AMYLASE in the last 72 hours. No results for  input(s): CKTOTAL, CKMB, CKMBINDEX, TROPONINI in the last 72 hours. Invalid input(s): POCBNP No results for input(s): DDIMER in the last 72 hours. Recent Labs    01/04/21 0225  HGBA1C 5.5   Recent Labs    01/04/21 0225  CHOL 200  HDL 49  LDLCALC 136*  TRIG 76  CHOLHDL 4.1   Recent Labs    01/04/21 0225  TSH 6.366*   No results for input(s): VITAMINB12, FOLATE, FERRITIN, TIBC, IRON, RETICCTPCT in the last 72 hours. Coags: No results for input(s): INR in the last 72 hours.  Invalid input(s): PT Microbiology: Recent Results (from the past 240 hour(s))  Resp Panel by RT-PCR (Flu A&B, Covid) Nasopharyngeal Swab     Status: None   Collection Time: 01/03/21  9:35 AM   Specimen: Nasopharyngeal Swab; Nasopharyngeal(NP) swabs in vial transport medium  Result Value Ref Range Status   SARS Coronavirus 2 by RT PCR NEGATIVE NEGATIVE Final    Comment: (NOTE) SARS-CoV-2 target nucleic acids are NOT DETECTED.  The SARS-CoV-2 RNA is generally detectable in upper respiratory specimens during the acute phase of infection. The lowest concentration of SARS-CoV-2 viral copies this assay can detect is 138 copies/mL. A negative result does not preclude SARS-Cov-2 infection and should not be used as the sole basis for treatment or other patient management decisions. A negative result may occur with  improper specimen collection/handling, submission of specimen other than nasopharyngeal swab, presence of viral mutation(s) within the areas targeted by this assay, and inadequate number of viral copies(<138 copies/mL). A negative result must be combined with clinical observations, patient history, and epidemiological information. The expected result is Negative.  Fact Sheet for Patients:  EntrepreneurPulse.com.au  Fact Sheet for Healthcare Providers:  IncredibleEmployment.be  This test is no t yet approved or cleared by the [place] FDA and  has been  authorized for detection and/or diagnosis of SARS-CoV-2 by FDA under an Emergency Use Authorization (EUA). This EUA will remain  in effect (meaning this test can be used) for the duration of the COVID-19 declaration under Section 564(b)(1) of the Act, 21 U.S.C.section 360bbb-3(b)(1), unless the authorization is terminated  or revoked sooner.       Influenza A by PCR NEGATIVE NEGATIVE Final   Influenza B by PCR NEGATIVE NEGATIVE Final    Comment: (NOTE) The Xpert Xpress SARS-CoV-2/FLU/RSV plus assay is intended as an aid in the diagnosis of influenza from Nasopharyngeal swab specimens and should not be used as a sole basis for treatment. Nasal washings and aspirates are unacceptable for Xpert Xpress SARS-CoV-2/FLU/RSV testing.  Fact Sheet for Patients: EntrepreneurPulse.com.au  Fact Sheet for Healthcare Providers: IncredibleEmployment.be  This test is not yet approved or cleared by the [place] FDA and has been authorized for detection and/or diagnosis of SARS-CoV-2  by FDA under an Emergency Use Authorization (EUA). This EUA will remain in effect (meaning this test can be used) for the duration of the COVID-19 declaration under Section 564(b)(1) of the Act, 21 U.S.C. section 360bbb-3(b)(1), unless the authorization is terminated or revoked.  Performed at [facility], [address] [place], [place] [address]     FURTHER DISCHARGE INSTRUCTIONS:  Get Medicines reviewed and adjusted: Please take all your medications with you for your next visit with your Primary MD  Laboratory/radiological data: Please request your Primary MD to go over all hospital tests and procedure/radiological results at the follow up, please ask your Primary MD to get all Hospital records sent to his/her office.  In some cases, they will be blood work, cultures and biopsy results pending at the time of your discharge. Please request that your  primary care M.D. goes through all the records of your hospital data and follows up on these results.  Also Note the following: If you experience worsening of your admission symptoms, develop shortness of breath, life threatening emergency, suicidal or homicidal thoughts you must seek medical attention immediately by calling 911 or calling your MD immediately  if symptoms less severe.  You must read complete instructions/literature along with all the possible adverse reactions/side effects for all the Medicines you take and that have been prescribed to you. Take any new Medicines after you have completely understood and accpet all the possible adverse reactions/side effects.   Do not drive when taking Pain medications or sleeping medications (Benzodaizepines)  Do not take more than prescribed Pain, Sleep and Anxiety Medications. It is not advisable to combine anxiety,sleep and pain medications without talking with your primary care practitioner  Special Instructions: If you have smoked or chewed Tobacco  in the last 2 yrs please stop smoking, stop any regular Alcohol  and or any Recreational drug use.  Wear Seat belts while driving.  Please note: You were cared for by a hospitalist during your hospital stay. Once you are discharged, your primary care physician will handle any further medical issues. Please note that NO REFILLS for any discharge medications will be authorized once you are discharged, as it is imperative that you return to your primary care physician (or establish a relationship with a primary care physician if you do not have one) for your post hospital discharge needs so that they can reassess your need for medications and monitor your lab values.  Total Time spent coordinating discharge including counseling, education and face to face time equals 35 minutes.  SignedOren Binet 01/05/2021 10:56 AM

## 2021-01-05 NOTE — Discharge Instructions (Signed)

## 2021-01-05 NOTE — TOC Benefit Eligibility Note (Signed)
Transition of Care [facility]) Benefit Eligibility Note    Patient Details  Name: Cynthia Cook MRN: [id number] Date of Birth: March 09, 1956   Medication/Dose: Arne Cleveland  5 MG BID  Covered?: Yes  Tier: 3 Drug  Prescription Coverage Preferred Pharmacy: [facility]  Spoke with Person/Company/Phone Number:: JAY L.   @ [employer] #  [phone]  Co-Pay: $47.00  Prior Approval: No  Deductible:  (NO DEDUCTIBLE WITH  PLAN / OUT-OF-POCKET: Linton Ham Phone Number: 01/05/2021, 10:21 AM

## 2021-01-05 NOTE — Progress Notes (Signed)
PIV removed. Discharge instructions completed. Patient verbalized understanding of medication regimen, follow up appointments and discharge instructions. Patient belongings gathered and packed to discharge. Waiting on meds from [place].

## 2021-01-10 ENCOUNTER — Telehealth: Payer: Self-pay

## 2021-01-10 NOTE — Telephone Encounter (Signed)
Looks like she was seen by Dr. Leonie Man during admission for SVD stroke - hx of A fib and recommended Eliquis.  Just prior to stroke, she was seeing Dr. Junius Roads with c/o dizziness and recommended therapy but not sure if this occurred.  Low suspicion dizziness is being caused by eliquis especially as she was having these symptoms prior. Dr. Leonie Man, can you please weigh in? Neither of [place] has any availability at this time to see patient sooner. Thank you

## 2021-01-10 NOTE — Telephone Encounter (Signed)
We received a call from this patient regarding scheduling her hospital follow up. She was disappointed in having to wait so long for her appointment, I did explain that per Dr Lenell Antu notes, he wants her to f/u with his NP in 2 months. We currently have her scheduled with Janett Billow 11/17.   She is concerned that her apixaban (ELIQUIS) 5 MG TABS tablet is causing headaches and dizziness, and may cause a fall. She wants to know if she should stop taking it. She does not have a PCP at this time, and her BP is normal every time that she checks it.  Can you please reach out to the patient regarding this concern?  Thanks!

## 2021-01-11 ENCOUNTER — Encounter (HOSPITAL_BASED_OUTPATIENT_CLINIC_OR_DEPARTMENT_OTHER): Payer: Self-pay

## 2021-01-11 NOTE — Telephone Encounter (Signed)
Spoke with the patient and informed her that Dr Leonie Man does not believe Eliquis is causing either headaches or dizziness as apparently she had this even before it was started.  She should keep her scheduled appointment to see Janett Billow.  We can put her on the cancellation list and see her sooner if we have an opening.  If headache or dizziness is significantly worse she needs to go to the emergency room. She stated that this is specifically related to when she takes Eliquis. The headache and dizziness happen within an hour of taking eliquis and dissipate about an hour later. She has no prior history of headaches. She voiced continued concern that her symptoms are from eliquis. I advised will send back to NP and Dr Leonie Man for their information anda call her with replies. Patient verbalized understanding, appreciation.

## 2021-01-11 NOTE — Telephone Encounter (Signed)
Please advise. Thank you

## 2021-01-11 NOTE — Telephone Encounter (Signed)
Please review and advise.    Thank you.

## 2021-01-11 NOTE — Telephone Encounter (Signed)
Please have her schedule f/u visit with her established cardiologist to discuss Eliquis concerns as she is on for hx of atrial fibrillation

## 2021-01-11 NOTE — Telephone Encounter (Signed)
Dizziness and headache would be very uncommon reaction to Eliquis. Per Dr. Clydene Fake (neurology) low suspicion it is contributory. Recommend she keep a log of blood pressures and heart rates at home to bring to next clinic visit. She may use Acetaminophen (Tylenol) as needed for headache. Recent blood work with no evidence of anemia which would cause dizziness. Recommend she ensure she is well hydrated, eating regular meals, and making position changes slowly. We will discuss further at her upcoming clinic visit.   Thanks! Loel Dubonnet, NP

## 2021-01-11 NOTE — Telephone Encounter (Signed)
Dizziness while on Eliquis most often occurs in the setting of anemia (low blood counts) precipitated by bleeding. Given recently normal hemoglobin there was thankfully no evidence of anemia. Recommend continued home monitoring for BP and HR at home along with staying well hydrated, eating regularly as she mentioned.  Given CHADS2VASC score of 4 which indicates a 6.7% annual risk of stroke it is very important that there is not an interruption in anticoagulation (blood thinner).   If she really would like to trial alternative before our upcoming appointment 01/21/21 she could switch to Xarelto 20mg  daily in the evening with dinner. Otherwise readdress at upcoming follow up.   Best,  Loel Dubonnet, NP

## 2021-01-11 NOTE — Telephone Encounter (Signed)
Called patient and advised NP stated to have her schedule f/u visit with her established cardiologist to discuss Eliquis concerns as she is on for history of atrial fibrillation. She does have FU later this month, stated she lives alone and "what if I pass out?" I advised she call her cardiologist now and advise them of her concerns. She agreed, verbalized understanding, appreciation.

## 2021-01-13 ENCOUNTER — Telehealth: Payer: Self-pay | Admitting: Cardiology

## 2021-01-13 ENCOUNTER — Encounter (HOSPITAL_BASED_OUTPATIENT_CLINIC_OR_DEPARTMENT_OTHER): Payer: Self-pay

## 2021-01-13 NOTE — Telephone Encounter (Signed)
Noted. Will address at follow up.   Loel Dubonnet, NP

## 2021-01-13 NOTE — Telephone Encounter (Signed)
Cynthia Cook is calling requesting verbal orders for this patient to have speech therapy once a week for 5 weeks.

## 2021-01-13 NOTE — Telephone Encounter (Signed)
Spoke with pt on phone regarding message sent in via [place]. Pt had a stressful event with her living situation last night and feels that the stress has possibly caused her to go back into a-fib. Pt states that she feels uncomfortable at the moment. She was unable to rest much last night. Blood pressure last night was 144/92 and this morning 138/78, heart rates have been in the 90s. Pt states that she has not missed any doses of eliquis. Pt states that she is still feeling weak from her recent CVA. Pt is unsure of what to do at the moment, would like to know if she should be seen sooner. Pt does not want to go to the ED and states that she doesn't have anyone to drive her and she can not afford an ambulance. Will forward to provider to advise.

## 2021-01-14 ENCOUNTER — Telehealth (HOSPITAL_BASED_OUTPATIENT_CLINIC_OR_DEPARTMENT_OTHER): Payer: Self-pay

## 2021-01-14 ENCOUNTER — Encounter (HOSPITAL_BASED_OUTPATIENT_CLINIC_OR_DEPARTMENT_OTHER): Payer: Self-pay

## 2021-01-14 NOTE — Telephone Encounter (Signed)
Addendum to previous note:  When asked how she was feeling today, pt stated she was feeling "amazing" today.  Denied symptoms of Afib. Laurann Montana NP aware of conversation with pt. And plan.

## 2021-01-14 NOTE — Telephone Encounter (Signed)
Sent My Chart message to patient this AM to offer patient an appt with Dr Harrell Gave for 10/10.  Pt did not respond to message so RN placed a call to patient to assess current concerns about Afib as stated in My Chart message sent yesterday.  Pt states she doesn't think she and Dr. Harrell Gave are "a good fit."  She states she would like to transfer care to Dr Haroldine Laws.  Explained to patient Dr Haroldine Laws is a specialist for Heart Failure.  Pt states she called Dr Bensimhon's office earlier and they told her he would see her. RN reminded patient she has an appt on October 14 with our NP [facility].  Pt asked about discussing supplements with Urban Gibson and RN explained she could ask Urban Gibson about supplements if she wanted to keep her appt on the 14th.  Explained to patient writer wasn't trying to change her mind about seeing Dr Haroldine Laws.  Pt verbalized appreciate for being told about "other options," and reminded of appt on the 14th.  Pt requested to keep appt for October 14th at 2:00.

## 2021-01-14 NOTE — Telephone Encounter (Signed)
Thank you SO much for your help!

## 2021-01-17 ENCOUNTER — Other Ambulatory Visit: Payer: Self-pay | Admitting: Neurology

## 2021-01-17 ENCOUNTER — Ambulatory Visit (HOSPITAL_BASED_OUTPATIENT_CLINIC_OR_DEPARTMENT_OTHER): Payer: Medicare Other | Admitting: Family

## 2021-01-17 DIAGNOSIS — I6300 Cerebral infarction due to thrombosis of unspecified precerebral artery: Secondary | ICD-10-CM

## 2021-01-17 NOTE — Telephone Encounter (Signed)
With her recent stroke, she would need to be seen in the office prior to use scheduling a cardioversion. As long as she is rate controlled, the afib doesn't require urgent ER visit (rates less than 110 on average). We can try to assist her with transportation as she is unable to drive. I would recommend continuing the eliquis, and we can work with our team to get her an appt and transportation for a visit. Thanks.

## 2021-01-17 NOTE — Telephone Encounter (Signed)
Follow Up:     Cynthia Cook is returning Cynthia Cook's call from today

## 2021-01-17 NOTE — Telephone Encounter (Signed)
This should be done by her neurologist, as they will need specific instructions on amount and duration of therapy related to her stroke. Thanks.

## 2021-01-17 NOTE — Telephone Encounter (Signed)
Left message to call back  

## 2021-01-17 NOTE — Telephone Encounter (Signed)
Pt has an appointment scheduled with Laurann Montana, NP for 10/14.

## 2021-01-17 NOTE — Telephone Encounter (Signed)
Cynthia Cook updated and verbalized understanding.

## 2021-01-17 NOTE — Telephone Encounter (Signed)
Cynthia Cook of MD's recommendations. She state pt first appointment with neurologist is not until November and pt currently doesn't have a pcp. She is requesting if MD could provide temporary orders in the interim.

## 2021-01-17 NOTE — Telephone Encounter (Signed)
I have reached out to the neurology team that saw her in the hospital and ask if they can give orders. It is out of my scope of practice and is best monitored by neurology. I will ask them to contact her.

## 2021-01-18 ENCOUNTER — Encounter (HOSPITAL_BASED_OUTPATIENT_CLINIC_OR_DEPARTMENT_OTHER): Payer: Self-pay

## 2021-01-19 ENCOUNTER — Encounter: Payer: Self-pay | Admitting: Adult Health

## 2021-01-19 NOTE — Telephone Encounter (Signed)
As I have not actually seen her yet in office, I am not able to place any orders as insurance would not cover it.  She can reach out to her PCP though who can further assist her with this

## 2021-01-20 ENCOUNTER — Telehealth (HOSPITAL_COMMUNITY): Payer: Self-pay

## 2021-01-20 ENCOUNTER — Other Ambulatory Visit (HOSPITAL_COMMUNITY): Payer: Self-pay

## 2021-01-20 NOTE — Telephone Encounter (Signed)
Yes. Dr. Leonie Man may be able to order. Thank you

## 2021-01-20 NOTE — Telephone Encounter (Signed)
Pharmacy Transitions of Care Follow-up Telephone Call  Date of discharge: 01/05/21    Discharge Diagnosis: CVA  How have you been since you were released from the hospital? fine   Medication changes made at discharge:  - START: Eliquis  - STOPPED:   - CHANGED:   Medication changes verified by the patient? NO (Yes/No)    Medication Accessibility:  Home Pharmacy:    Was the patient provided with refills on discharged medications? yes   Have all prescriptions been transferred from [facility] to home pharmacy? no   Is the patient able to afford medications? yes Notable copays: 47 Eligible patient assistance: no    Medication Review: (DELETE ANY MEDICATION BELOW THAT YOU DO NOT DISCUSS)   APIXABAN (ELIQUIS)  Apixaban 10 mg BID initiated on . Will switch to apixaban 5 mg BID after 7 days (DATE).  - Discussed importance of taking medication around the same time everyday  - Reviewed potential DDIs with patient  - Advised patient of medications to avoid (NSAIDs, ASA)  - Educated that Tylenol (acetaminophen) will be the preferred analgesic to prevent risk of bleeding  - Emphasized importance of monitoring for signs and symptoms of bleeding (abnormal bruising, prolonged bleeding, nose bleeds, bleeding from gums, discolored urine, black tarry stools)  - Advised patient to alert all providers of anticoagulation therapy prior to starting a new medication or having a procedure   Follow-up Appointments:  PCP Hospital f/u appt confirmed? no   Specialist Hospital f/u appt confirmed? no   If their condition worsens, is the pt aware to call PCP or go to the Emergency Dept.? yes  Final Patient Assessment: This patient is tired of talking to so many people and doesn't need to talk to anyone else. She does not want her medicine filled here bc she does not have anyone to pick it up and does not trust the mail. She does not want to discuss her medication. She has enough people in and out to help  her.

## 2021-01-21 ENCOUNTER — Encounter (HOSPITAL_BASED_OUTPATIENT_CLINIC_OR_DEPARTMENT_OTHER): Payer: Self-pay | Admitting: Family

## 2021-01-21 ENCOUNTER — Other Ambulatory Visit (HOSPITAL_COMMUNITY): Payer: Self-pay

## 2021-01-21 ENCOUNTER — Other Ambulatory Visit: Payer: Self-pay

## 2021-01-21 ENCOUNTER — Ambulatory Visit (HOSPITAL_BASED_OUTPATIENT_CLINIC_OR_DEPARTMENT_OTHER): Payer: Medicare Other | Admitting: Family

## 2021-01-21 ENCOUNTER — Encounter (HOSPITAL_BASED_OUTPATIENT_CLINIC_OR_DEPARTMENT_OTHER): Payer: Self-pay

## 2021-01-21 VITALS — BP 140/78 | HR 91 | Ht 66.0 in | Wt 213.0 lb

## 2021-01-21 DIAGNOSIS — Z86711 Personal history of pulmonary embolism: Secondary | ICD-10-CM

## 2021-01-21 DIAGNOSIS — Z8673 Personal history of transient ischemic attack (TIA), and cerebral infarction without residual deficits: Secondary | ICD-10-CM

## 2021-01-21 DIAGNOSIS — I480 Paroxysmal atrial fibrillation: Secondary | ICD-10-CM | POA: Diagnosis not present

## 2021-01-21 DIAGNOSIS — I340 Nonrheumatic mitral (valve) insufficiency: Secondary | ICD-10-CM

## 2021-01-21 DIAGNOSIS — I350 Nonrheumatic aortic (valve) stenosis: Secondary | ICD-10-CM

## 2021-01-21 DIAGNOSIS — D6859 Other primary thrombophilia: Secondary | ICD-10-CM | POA: Diagnosis not present

## 2021-01-21 DIAGNOSIS — E785 Hyperlipidemia, unspecified: Secondary | ICD-10-CM | POA: Diagnosis not present

## 2021-01-21 DIAGNOSIS — N1831 Chronic kidney disease, stage 3a: Secondary | ICD-10-CM

## 2021-01-21 DIAGNOSIS — I342 Nonrheumatic mitral (valve) stenosis: Secondary | ICD-10-CM

## 2021-01-21 DIAGNOSIS — I6523 Occlusion and stenosis of bilateral carotid arteries: Secondary | ICD-10-CM

## 2021-01-21 DIAGNOSIS — E039 Hypothyroidism, unspecified: Secondary | ICD-10-CM

## 2021-01-21 MED ORDER — DILTIAZEM HCL 30 MG PO TABS: 30.0000 mg | ORAL_TABLET | ORAL | 2 refills | Status: DC | PRN

## 2021-01-21 MED ORDER — EZETIMIBE 10 MG PO TABS: 10.0000 mg | ORAL_TABLET | Freq: Every day | ORAL | 3 refills | Status: DC

## 2021-01-21 NOTE — Progress Notes (Signed)
Office Visit    Patient Name: Cynthia Cook Date of Encounter: 01/21/2021  PCP:  Eunice Blase, MD   [place]  Cardiologist:  Buford Dresser, MD  Advanced Practice Provider:  No care team member to display Electrophysiologist:  None      Chief Complaint    Cynthia Cook is a 65 y.o. female with a hx of paroxysmal atrial fibrillation, CVA, thyroid disease, CKD 3A, aortic stenosis, pulmonary embolism, mitral regurgitation/stenosis, syncope presents today for hospital follow up   Past Medical History    Past Medical History:  Diagnosis Date   Back pain    Gout    Murmur    PE (pulmonary embolism)    Persistent atrial fibrillation ([place])    Pulmonary emboli ([place]) 02/05/2020   Syncope    Thyroid disease    No past surgical history on file.  Allergies  Allergies  Allergen Reactions   Codeine Nausea Only   Fentanyl Nausea And Vomiting    Dizziness, lightheaded     History of Present Illness    Cynthia Cook is a 65 y.o. female with a hx of paroxysmal atrial fibrillation, CVA, thyroid disease, CKD 3A, aortic stenosis, pulmonary embolism, mitral regurgitation/stenosis, syncope last seen in clinic 03/16/2020 by Dr. Harrell Gave.  She has established with Dr. Harrell Gave due to atrial fibrillation.  She underwent cardioversion 02/27/2020 after presenting to the ED with atrial fib with RVR.  She has previously declined anticoagulation.  She was admitted 12/2020 due to acute CVA.  MRI brain with subacute infarct in left centrum semiovale.  Carotid duplex 01/04/21 with bilateral 1-39% stenosis.  Echo with bubble 01/04/2021 with LVEF 55 to 60%, no R WMA, grade 1 diastolic dysfunction, RV normal size and function, left atrium moderately dilated, moderate MR, mild to moderate mitral stenosis, trivial AI, mild to moderate aortic stenosis, mild dilation of ascending aorta 41 mmHg.  There was no evidence of interatrial shunt, bubble study negative. She  was recommended for CIR but declined and returned home with home health.   She had trouble with headache and dizziness after discharge but this resolved with taking her vitamins/supplements separately from her Eliquis.  Presents today for follow-up.  Tells me since being home she has felt overall tired and weak.  She continues to work with PT, OT, SLP at home and notes a significant improvement in her speech.  Reports decreased appetite, no change in taste, lost about 10 pounds since being home.  Note she is not as active as she used to be and wonders if this is contributory. Reports no shortness of breath nor dyspnea on exertion. Reports no chest pain, pressure, or tightness. No edema, orthopnea, PND. Reports no palpitations.  Lives at home alone with her daschund who she likes to walk with.  Very hesitant regarding statins and politely declines to take.  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  EKGs/Labs/Other Studies Reviewed:   The following studies were reviewed today: Carotid duplex 01/04/2021 Summary:  Right Carotid: Velocities in the right ICA are consistent with a 1-39%  stenosis.   Left Carotid: Velocities in the left ICA are consistent with a 1-39%  stenosis.   Vertebrals: Bilateral vertebral arteries demonstrate antegrade flow.  Subclavians: Normal flow hemodynamics were seen in bilateral subclavian               arteries.   Echo 02/06/20 1. Left ventricular ejection fraction, by estimation,  is 55 to 60%. The  left ventricle has normal function. The left ventricle has no regional  wall motion abnormalities. There is mild concentric left ventricular  hypertrophy. Left ventricular diastolic  function could not be evaluated.   2. Moderate calcific mitral stenosis is present. MG 5.6 mmHG @ 109 bpm.  MVA by VTI 1.86 cm2. Mild to moderate MR is present which is related to  restricted movement of the PMVL (IIIB). The mitral  valve is degenerative.  Mild to moderate mitral valve  regurgitation. Moderate mitral stenosis. Severe mitral annular  calcification.   3. Mild to moderate AS is present. V max 2.25 m/s, MG 12.3 mmHG, AVA 1.29  cm2, DI 0.34. Gradients lower than expected due to low SVI (SV=56 cc,  SVI=26 cc/m2). The aortic valve is tricuspid. There is moderate  calcification of the aortic valve. There is  moderate thickening of the aortic valve. Aortic valve regurgitation is not  visualized. Mild to moderate aortic valve stenosis. Aortic valve area, by  VTI measures 1.29 cm. Aortic valve mean gradient measures 12.3 mmHg.  Aortic valve Vmax measures 2.25  m/s.   4. Right ventricular systolic function is normal. The right ventricular  size is mildly enlarged. There is mildly elevated pulmonary artery  systolic pressure. The estimated right ventricular systolic pressure is  46.2 mmHg.   5. Left atrial size was severely dilated.   6. The inferior vena cava is dilated in size with <50% respiratory  variability, suggesting right atrial pressure of 15 mmHg.   Comparison(s): Changes from prior study are noted. Afib with RVR is now  present. Valvular heart disease is stable.    CT PE 02/27/20 FINDINGS: Cardiovascular: There is adequate opacification of the pulmonary arterial tree. There is no intraluminal filling defect identified to suggest acute pulmonary embolism. The central pulmonary arteries are enlarged in keeping with pulmonary arterial hypertension. There is extensive coronary artery calcification. There is extensive calcification of the mitral valve annulus. Mild calcification of the aortic valve leaflets. There is mild global cardiomegaly. No pericardial effusion. Atherosclerotic calcification is seen within the thoracic aorta. No aortic aneurysm.   Mediastinum/Nodes: No pathologic thoracic adenopathy. Esophagus is unremarkable.   Lungs/Pleura: Trace right and small left pleural effusions  are present. Mild bibasilar atelectasis. No superimposed confluent pulmonary infiltrate. No pneumothorax. Central airways are widely patent.   Upper Abdomen: Unremarkable   Musculoskeletal: No acute bone abnormality.   Review of the MIP images confirms the above findings.   IMPRESSION: No pulmonary embolism.   Morphologic changes of pulmonary arterial hypertension.   Mild global cardiomegaly.  Extensive coronary artery calcification.   Extensive calcification of the mitral valve annulus noted. Echocardiography may be helpful to assess the degree of valvular dysfunction.   Trace right and small left pleural effusions, slightly increased since prior examination.  EKG:  EKG is  ordered today.  The ekg ordered today demonstrates NSR 91 bpm with no acute ST/T wave changes.   Recent Labs: 02/05/2020: B Natriuretic Peptide 341.7 02/27/2020: Magnesium 2.3 02/28/2020: ALT 11 01/03/2021: BUN 35; Creatinine, Ser 1.26; Hemoglobin 12.1; Platelets 157; Potassium 4.3; Sodium 143 01/04/2021: TSH 6.366  Recent Lipid Panel    Component Value Date/Time   CHOL 200 01/04/2021 0225   TRIG 76 01/04/2021 0225   HDL 49 01/04/2021 0225   CHOLHDL 4.1 01/04/2021 0225   VLDL 15 01/04/2021 0225   LDLCALC 136 (H) 01/04/2021 0225    Risk Assessment/Calculations:   CHA2DS2-VASc Score = 6  This indicates  a 9.7% annual risk of stroke. The patient's score is based upon: CHF History: 0 HTN History: 1 Diabetes History: 0 Stroke History: 2 Vascular Disease History: 1 Age Score: 1 Gender Score: 1    Home Medications   Current Meds  Medication Sig   apixaban (ELIQUIS) 5 MG TABS tablet Take 1 tablet (5 mg total) by mouth 2 (two) times daily.   ascorbic acid (VITAMIN C) 500 MG tablet Take 500 mg by mouth daily.   triamcinolone cream (KENALOG) 0.1 % Apply 1 application topically daily as needed (rash).    Vitamin D-Vitamin K (VITAMIN K2-VITAMIN D3 PO) Take 1 tablet by mouth daily.    vitamin E  180 MG (400 UNITS) capsule Take 400 Units by mouth daily.     Review of Systems      All other systems reviewed and are otherwise negative except as noted above.  Physical Exam    VS:  BP 140/78   Pulse 91   Ht 5\' 6"  (1.676 m)   Wt 213 lb (96.6 kg)   SpO2 93%   BMI 34.38 kg/m  , BMI Body mass index is 34.38 kg/m.  Wt Readings from Last 3 Encounters:  01/21/21 213 lb (96.6 kg)  01/03/21 250 lb (113.4 kg)  11/02/20 221 lb 12.8 oz (100.6 kg)    GEN: Well nourished, overweight, overweight, well developed, in no acute distress. HEENT: normal. Neck: Supple, no JVD, carotid bruits, or masses. Cardiac: RRR, no  rubs, or gallops. Systolic murmur noted. No clubbing, cyanosis, edema.  Radials/PT 2+ and equal bilaterally.  Respiratory:  Respirations regular and unlabored, clear to auscultation bilaterally. GI: Soft, nontender, nondistended. MS: No deformity or atrophy. Skin: Warm and dry, no rash. Neuro:  Strength and sensation are intact. Psych: Normal affect.  Assessment & Plan    Valvular heart disease -echocardiogram 01/04/2021 with mild to moderate aortic valve stenosis and mild to moderate mitral stenosis.  Plan for repeat echocardiogram in 1 year.  Continue optimal blood pressure and volume control.  Coronary artery calcification on CT -CT 02/2020 with extensive coronary calcifications.  Able with no anginal symptoms.  GDMT includes Zetia.  No aspirin due to chronic anticoagulation.  No beta-blocker due to previous intolerance. Heart healthy diet and regular cardiovascular exercise encouraged.    History of CVA - Upcoming neurology appointment 02/24/21.  Declined statin.  No aspirin due to chronic anticoagulation.  Study added, as detailed below.  Paroxysmal atrial fibrillation /chronic anticoagulation -maintaining sinus rhythm by EKG today.  Reports 1 episode of palpitations since discharge in the setting of a stressful conversation.  Previous intolerance to metoprolol.  We will  Rx diltiazem 30 mg as needed for persistent tachycardia greater than 110 bpm.  She will continue Eliquis 5 mg twice daily.  CHA2DS2-VASc Score = 6 [CHF History: 0, HTN History: 1, Diabetes History: 0, Stroke History: 2, Vascular Disease History: 1, Age Score: 1, Gender Score: 1].  Therefore, the patient's annual risk of stroke is 9.7 %.   Reports no bleeding complications.  CBC, BMP today for monitoring.  She does not meet dose reduction criteria.  Patient assistance paperwork completed in clinic today.  Bilateral carotid artery disease -05/06/2020 carotid duplex with bilateral 1 to 39% stenosis. Continue cholesterol management as detailed below.  Continue optimal blood pressure control.  Consider repeat study as clinically indicated.  Denies amaurosis fugax.  Obesity - Weight loss via diet and exercise encouraged. Discussed the impact being overweight would have on cardiovascular risk.  History of PE - Continue Eliquis 5mg  BID.   Hypothyroidism - not presently taking her Levothyroxine. Not dicussed today. Recommend follow up with PCP.   CKD 3 A - Careful titration of diuretic and antihypertensive.  Encouraged to increase hydration. BMP today for monitoring.   HLD, LDL goal less than 70 -01/04/2021 LDL 136. Tells me "she hates the ideas of statins". 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 Consider repeat lipid panel at follow up.   Disposition: Follow up in 4 month(s) with Loel Dubonnet, NP   Signed, Loel Dubonnet, NP 01/21/2021, 2:15 PM [place]

## 2021-01-21 NOTE — Patient Instructions (Signed)
Medication Instructions:  Your physician has recommended you make the following change in your medication:   START Ezetimibe (Zetia) one 10 mg daily   START Diltiazem one 30 mg tablet as needed for heart rate persistently more than 110 bpm  *If you need a refill on your cardiac medications before your next appointment, please call your pharmacy*   Lab Work: Your physician recommends that you return for lab work today for BMP, CBC  If you have labs (blood work) drawn today and your tests are completely normal, you will receive your results only by: [place] (if you have MyChart) OR A paper copy in the mail If you have any lab test that is abnormal or we need to change your treatment, we will call you to review the results.   Testing/Procedures:  Your physician has requested that you have an echocardiogram in September 2023. Echocardiography is a painless test that uses sound waves to create images of your heart. It provides your doctor with information about the size and shape of your heart and how well your heart's chambers and valves are working. This procedure takes approximately one hour. There are no restrictions for this procedure.    Follow-Up: At [facility], you and your health needs are our priority.  As part of our continuing mission to provide you with exceptional heart care, we have created designated Provider Care Teams.  These Care Teams include your primary Cardiologist (physician) and Advanced Practice Providers (APPs -  Physician Assistants and Nurse Practitioners) who all work together to provide you with the care you need, when you need it.  We recommend signing up for the patient portal called "MyChart".  Sign up information is provided on this After Visit Summary.  MyChart is used to connect with patients for Virtual Visits (Telemedicine).  Patients are able to view lab/test results, encounter notes, upcoming appointments, etc.  Non-urgent messages can be  sent to your provider as well.   To learn more about what you can do with MyChart, go to [email or web address].    Your next appointment:   4 month(s)  The format for your next appointment:   In Person  Provider:   Laurann Montana, NP   Other Instructions  Heart Healthy Diet Recommendations: A low-salt diet is recommended. Meats should be grilled, baked, or boiled. Avoid fried foods. Focus on lean protein sources like fish or chicken with vegetables and fruits. The American Heart Association is a Microbiologist!   Exercise recommendations: The American Heart Association recommends 150 minutes of moderate intensity exercise weekly. Try 30 minutes of moderate intensity exercise 4-5 times per week. This could include walking, jogging, or swimming.

## 2021-01-21 NOTE — Telephone Encounter (Signed)
Called pt and informed her of location and where to come in. Informed her we are on the second floor. Pt verbalized thanks.

## 2021-01-22 ENCOUNTER — Encounter (HOSPITAL_BASED_OUTPATIENT_CLINIC_OR_DEPARTMENT_OTHER): Payer: Self-pay

## 2021-01-22 LAB — BASIC METABOLIC PANEL
BUN/Creatinine Ratio: 20 (ref 12–28)
BUN: 26 mg/dL (ref 8–27)
CO2: 22 mmol/L (ref 20–29)
Calcium: 10.2 mg/dL (ref 8.7–10.3)
Chloride: 101 mmol/L (ref 96–106)
Creatinine, Ser: 1.31 mg/dL — ABNORMAL HIGH (ref 0.57–1.00)
Glucose: 124 mg/dL — ABNORMAL HIGH (ref 70–99)
Potassium: 4.4 mmol/L (ref 3.5–5.2)
Sodium: 145 mmol/L — ABNORMAL HIGH (ref 134–144)
eGFR: 45 mL/min/{1.73_m2} — ABNORMAL LOW (ref >59)

## 2021-01-22 LAB — CBC
Hematocrit: 40.4 % (ref 34.0–46.6)
Hemoglobin: 13.5 g/dL (ref 11.1–15.9)
MCH: 28.5 pg (ref 26.6–33.0)
MCHC: 33.4 g/dL (ref 31.5–35.7)
MCV: 85 fL (ref 79–97)
Platelets: 196 10*3/uL (ref 150–450)
RBC: 4.73 x10E6/uL (ref 3.77–5.28)
RDW: 12.9 % (ref 11.7–15.4)
WBC: 6.6 10*3/uL (ref 3.4–10.8)

## 2021-01-24 ENCOUNTER — Encounter (HOSPITAL_BASED_OUTPATIENT_CLINIC_OR_DEPARTMENT_OTHER): Payer: Self-pay

## 2021-01-24 ENCOUNTER — Telehealth: Payer: Self-pay | Admitting: Neurology

## 2021-01-24 NOTE — Telephone Encounter (Signed)
Returned the call to PT, [employer] Left message to proceed with orders to meet patient's needs (outlined below).

## 2021-01-24 NOTE — Telephone Encounter (Signed)
Cynthia Cook Physical Therapist for [facility] called and LVM asking for extended time verbal orders for the pt for Balance, Stamina and Strength. She is requesting 2W x 4 Please advise.

## 2021-01-25 ENCOUNTER — Encounter (HOSPITAL_BASED_OUTPATIENT_CLINIC_OR_DEPARTMENT_OTHER): Payer: Self-pay

## 2021-01-25 ENCOUNTER — Telehealth (HOSPITAL_BASED_OUTPATIENT_CLINIC_OR_DEPARTMENT_OTHER): Payer: Self-pay | Admitting: Family

## 2021-01-25 ENCOUNTER — Encounter (HOSPITAL_BASED_OUTPATIENT_CLINIC_OR_DEPARTMENT_OTHER): Payer: Self-pay | Admitting: Nurse Practitioner

## 2021-01-25 NOTE — Telephone Encounter (Signed)
Left message for patient to call and scheduled the Echocardiogram ordered by Laurann Montana, NP

## 2021-01-28 ENCOUNTER — Other Ambulatory Visit (HOSPITAL_COMMUNITY): Payer: Self-pay

## 2021-01-31 ENCOUNTER — Other Ambulatory Visit (HOSPITAL_COMMUNITY): Payer: Self-pay

## 2021-02-04 ENCOUNTER — Encounter (HOSPITAL_BASED_OUTPATIENT_CLINIC_OR_DEPARTMENT_OTHER): Payer: Self-pay

## 2021-02-07 ENCOUNTER — Encounter (HOSPITAL_BASED_OUTPATIENT_CLINIC_OR_DEPARTMENT_OTHER): Payer: Self-pay

## 2021-02-07 ENCOUNTER — Ambulatory Visit (HOSPITAL_BASED_OUTPATIENT_CLINIC_OR_DEPARTMENT_OTHER): Payer: Medicare Other | Admitting: Nurse Practitioner

## 2021-02-08 DIAGNOSIS — I480 Paroxysmal atrial fibrillation: Secondary | ICD-10-CM | POA: Diagnosis not present

## 2021-02-08 DIAGNOSIS — I050 Rheumatic mitral stenosis: Secondary | ICD-10-CM | POA: Diagnosis not present

## 2021-02-08 DIAGNOSIS — I6781 Acute cerebrovascular insufficiency: Secondary | ICD-10-CM | POA: Diagnosis not present

## 2021-02-08 DIAGNOSIS — Z7901 Long term (current) use of anticoagulants: Secondary | ICD-10-CM | POA: Diagnosis not present

## 2021-02-08 DIAGNOSIS — I5189 Other ill-defined heart diseases: Secondary | ICD-10-CM | POA: Diagnosis not present

## 2021-02-08 DIAGNOSIS — Z9181 History of falling: Secondary | ICD-10-CM | POA: Diagnosis not present

## 2021-02-08 DIAGNOSIS — R011 Cardiac murmur, unspecified: Secondary | ICD-10-CM | POA: Diagnosis not present

## 2021-02-08 DIAGNOSIS — N1831 Chronic kidney disease, stage 3a: Secondary | ICD-10-CM | POA: Diagnosis not present

## 2021-02-08 DIAGNOSIS — E785 Hyperlipidemia, unspecified: Secondary | ICD-10-CM | POA: Diagnosis not present

## 2021-02-08 DIAGNOSIS — I69322 Dysarthria following cerebral infarction: Secondary | ICD-10-CM | POA: Diagnosis not present

## 2021-02-08 DIAGNOSIS — Z86711 Personal history of pulmonary embolism: Secondary | ICD-10-CM | POA: Diagnosis not present

## 2021-02-08 DIAGNOSIS — M1030 Gout due to renal impairment, unspecified site: Secondary | ICD-10-CM | POA: Diagnosis not present

## 2021-02-08 DIAGNOSIS — E039 Hypothyroidism, unspecified: Secondary | ICD-10-CM | POA: Diagnosis not present

## 2021-02-08 NOTE — Telephone Encounter (Signed)
[place] to find out about application for Eliquis since we had not received anything from them. Patient was denied on 01/26/21 due to not yet meeting out-of-pocket cost in the amount of $850.35. They stated once she has met that, she should be approved for patient assistance.

## 2021-02-09 DIAGNOSIS — I69322 Dysarthria following cerebral infarction: Secondary | ICD-10-CM | POA: Diagnosis not present

## 2021-02-09 DIAGNOSIS — M1030 Gout due to renal impairment, unspecified site: Secondary | ICD-10-CM | POA: Diagnosis not present

## 2021-02-09 DIAGNOSIS — I050 Rheumatic mitral stenosis: Secondary | ICD-10-CM | POA: Diagnosis not present

## 2021-02-09 DIAGNOSIS — I5189 Other ill-defined heart diseases: Secondary | ICD-10-CM | POA: Diagnosis not present

## 2021-02-09 DIAGNOSIS — Z7901 Long term (current) use of anticoagulants: Secondary | ICD-10-CM | POA: Diagnosis not present

## 2021-02-09 DIAGNOSIS — N1831 Chronic kidney disease, stage 3a: Secondary | ICD-10-CM | POA: Diagnosis not present

## 2021-02-09 DIAGNOSIS — R011 Cardiac murmur, unspecified: Secondary | ICD-10-CM | POA: Diagnosis not present

## 2021-02-09 DIAGNOSIS — I6781 Acute cerebrovascular insufficiency: Secondary | ICD-10-CM | POA: Diagnosis not present

## 2021-02-09 DIAGNOSIS — Z9181 History of falling: Secondary | ICD-10-CM | POA: Diagnosis not present

## 2021-02-09 DIAGNOSIS — E039 Hypothyroidism, unspecified: Secondary | ICD-10-CM | POA: Diagnosis not present

## 2021-02-09 DIAGNOSIS — I480 Paroxysmal atrial fibrillation: Secondary | ICD-10-CM | POA: Diagnosis not present

## 2021-02-09 DIAGNOSIS — E785 Hyperlipidemia, unspecified: Secondary | ICD-10-CM | POA: Diagnosis not present

## 2021-02-09 DIAGNOSIS — Z86711 Personal history of pulmonary embolism: Secondary | ICD-10-CM | POA: Diagnosis not present

## 2021-02-09 NOTE — Telephone Encounter (Signed)
Please advise if needed. Thank you!

## 2021-02-11 ENCOUNTER — Ambulatory Visit (HOSPITAL_BASED_OUTPATIENT_CLINIC_OR_DEPARTMENT_OTHER): Payer: Medicare Other | Admitting: Nurse Practitioner

## 2021-02-11 DIAGNOSIS — I050 Rheumatic mitral stenosis: Secondary | ICD-10-CM | POA: Diagnosis not present

## 2021-02-11 DIAGNOSIS — Z9181 History of falling: Secondary | ICD-10-CM | POA: Diagnosis not present

## 2021-02-11 DIAGNOSIS — I6781 Acute cerebrovascular insufficiency: Secondary | ICD-10-CM | POA: Diagnosis not present

## 2021-02-11 DIAGNOSIS — M1030 Gout due to renal impairment, unspecified site: Secondary | ICD-10-CM | POA: Diagnosis not present

## 2021-02-11 DIAGNOSIS — N1831 Chronic kidney disease, stage 3a: Secondary | ICD-10-CM | POA: Diagnosis not present

## 2021-02-11 DIAGNOSIS — E785 Hyperlipidemia, unspecified: Secondary | ICD-10-CM | POA: Diagnosis not present

## 2021-02-11 DIAGNOSIS — Z7901 Long term (current) use of anticoagulants: Secondary | ICD-10-CM | POA: Diagnosis not present

## 2021-02-11 DIAGNOSIS — E039 Hypothyroidism, unspecified: Secondary | ICD-10-CM | POA: Diagnosis not present

## 2021-02-11 DIAGNOSIS — Z86711 Personal history of pulmonary embolism: Secondary | ICD-10-CM | POA: Diagnosis not present

## 2021-02-11 DIAGNOSIS — I5189 Other ill-defined heart diseases: Secondary | ICD-10-CM | POA: Diagnosis not present

## 2021-02-11 DIAGNOSIS — I69322 Dysarthria following cerebral infarction: Secondary | ICD-10-CM | POA: Diagnosis not present

## 2021-02-11 DIAGNOSIS — I480 Paroxysmal atrial fibrillation: Secondary | ICD-10-CM | POA: Diagnosis not present

## 2021-02-11 DIAGNOSIS — R011 Cardiac murmur, unspecified: Secondary | ICD-10-CM | POA: Diagnosis not present

## 2021-02-11 NOTE — Telephone Encounter (Signed)
FYI

## 2021-02-12 DIAGNOSIS — I5189 Other ill-defined heart diseases: Secondary | ICD-10-CM | POA: Diagnosis not present

## 2021-02-12 DIAGNOSIS — I6781 Acute cerebrovascular insufficiency: Secondary | ICD-10-CM | POA: Diagnosis not present

## 2021-02-12 DIAGNOSIS — I050 Rheumatic mitral stenosis: Secondary | ICD-10-CM | POA: Diagnosis not present

## 2021-02-12 DIAGNOSIS — M1030 Gout due to renal impairment, unspecified site: Secondary | ICD-10-CM | POA: Diagnosis not present

## 2021-02-12 DIAGNOSIS — Z86711 Personal history of pulmonary embolism: Secondary | ICD-10-CM | POA: Diagnosis not present

## 2021-02-12 DIAGNOSIS — I480 Paroxysmal atrial fibrillation: Secondary | ICD-10-CM | POA: Diagnosis not present

## 2021-02-12 DIAGNOSIS — E785 Hyperlipidemia, unspecified: Secondary | ICD-10-CM | POA: Diagnosis not present

## 2021-02-12 DIAGNOSIS — Z9181 History of falling: Secondary | ICD-10-CM | POA: Diagnosis not present

## 2021-02-12 DIAGNOSIS — Z7901 Long term (current) use of anticoagulants: Secondary | ICD-10-CM | POA: Diagnosis not present

## 2021-02-12 DIAGNOSIS — E039 Hypothyroidism, unspecified: Secondary | ICD-10-CM | POA: Diagnosis not present

## 2021-02-12 DIAGNOSIS — R011 Cardiac murmur, unspecified: Secondary | ICD-10-CM | POA: Diagnosis not present

## 2021-02-12 DIAGNOSIS — N1831 Chronic kidney disease, stage 3a: Secondary | ICD-10-CM | POA: Diagnosis not present

## 2021-02-12 DIAGNOSIS — I69322 Dysarthria following cerebral infarction: Secondary | ICD-10-CM | POA: Diagnosis not present

## 2021-02-16 DIAGNOSIS — R011 Cardiac murmur, unspecified: Secondary | ICD-10-CM | POA: Diagnosis not present

## 2021-02-16 DIAGNOSIS — E785 Hyperlipidemia, unspecified: Secondary | ICD-10-CM | POA: Diagnosis not present

## 2021-02-16 DIAGNOSIS — Z7901 Long term (current) use of anticoagulants: Secondary | ICD-10-CM | POA: Diagnosis not present

## 2021-02-16 DIAGNOSIS — I050 Rheumatic mitral stenosis: Secondary | ICD-10-CM | POA: Diagnosis not present

## 2021-02-16 DIAGNOSIS — N1831 Chronic kidney disease, stage 3a: Secondary | ICD-10-CM | POA: Diagnosis not present

## 2021-02-16 DIAGNOSIS — Z86711 Personal history of pulmonary embolism: Secondary | ICD-10-CM | POA: Diagnosis not present

## 2021-02-16 DIAGNOSIS — I6781 Acute cerebrovascular insufficiency: Secondary | ICD-10-CM | POA: Diagnosis not present

## 2021-02-16 DIAGNOSIS — I69322 Dysarthria following cerebral infarction: Secondary | ICD-10-CM | POA: Diagnosis not present

## 2021-02-16 DIAGNOSIS — Z9181 History of falling: Secondary | ICD-10-CM | POA: Diagnosis not present

## 2021-02-16 DIAGNOSIS — E039 Hypothyroidism, unspecified: Secondary | ICD-10-CM | POA: Diagnosis not present

## 2021-02-16 DIAGNOSIS — M1030 Gout due to renal impairment, unspecified site: Secondary | ICD-10-CM | POA: Diagnosis not present

## 2021-02-16 DIAGNOSIS — I480 Paroxysmal atrial fibrillation: Secondary | ICD-10-CM | POA: Diagnosis not present

## 2021-02-16 DIAGNOSIS — I5189 Other ill-defined heart diseases: Secondary | ICD-10-CM | POA: Diagnosis not present

## 2021-02-17 ENCOUNTER — Other Ambulatory Visit: Payer: Self-pay

## 2021-02-17 ENCOUNTER — Encounter (HOSPITAL_BASED_OUTPATIENT_CLINIC_OR_DEPARTMENT_OTHER): Payer: Self-pay | Admitting: Nurse Practitioner

## 2021-02-17 ENCOUNTER — Ambulatory Visit (INDEPENDENT_AMBULATORY_CARE_PROVIDER_SITE_OTHER): Payer: Medicare Other | Admitting: Nurse Practitioner

## 2021-02-17 VITALS — BP 122/82 | HR 93 | Ht 66.0 in | Wt 211.0 lb

## 2021-02-17 DIAGNOSIS — S8992XA Unspecified injury of left lower leg, initial encounter: Secondary | ICD-10-CM | POA: Insufficient documentation

## 2021-02-17 DIAGNOSIS — E610 Copper deficiency: Secondary | ICD-10-CM

## 2021-02-17 DIAGNOSIS — E063 Autoimmune thyroiditis: Secondary | ICD-10-CM

## 2021-02-17 DIAGNOSIS — I639 Cerebral infarction, unspecified: Secondary | ICD-10-CM

## 2021-02-17 DIAGNOSIS — N1831 Chronic kidney disease, stage 3a: Secondary | ICD-10-CM

## 2021-02-17 DIAGNOSIS — E038 Other specified hypothyroidism: Secondary | ICD-10-CM

## 2021-02-17 DIAGNOSIS — Z0000 Encounter for general adult medical examination without abnormal findings: Secondary | ICD-10-CM

## 2021-02-17 DIAGNOSIS — E60 Dietary zinc deficiency: Secondary | ICD-10-CM

## 2021-02-17 NOTE — Patient Instructions (Signed)
Thank you for choosing [facility] for your Primary Care needs. I am excited for the opportunity to partner with you to meet your health care goals. It was a pleasure meeting you today!  Recommendations from today's visit: I have ordered labs for you today and we will let you know once those results have come back to [place].  I will look into the hyperbaric therapy and see if that is something that we can order Monitor the lump on your shin and let me know if you notice any changes.   Information on diet, exercise, and health maintenance recommendations are listed below. This is information to help you be sure you are on track for optimal health and monitoring.   Please look over this and let us know if you have any questions or if you have completed any of the health maintenance outside of [place] so that we can be sure your records are up to date.  ___________________________________________________________ About Me: I am an Adult-Geriatric Nurse Practitioner with a background in caring for patients for more than 20 years with a strong intensive care background. I provide primary care and sports medicine services to patients age 46 and older within this office. My education had a strong focus on caring for the older adult population, which I am passionate about. I am also the director of the APP Fellowship with [facility].   My desire is to provide you with the best service through preventive medicine and supportive care. I consider you a part of the medical team and value your input. I work diligently to ensure that you are heard and your needs are met in a safe and effective manner. I want you to feel comfortable with me as your provider and want you to know that your health concerns are important to me.  For your information, our office hours are: Monday, Tuesday, and Thursday 8:00 AM - 5:00 PM Wednesday and Friday 8:00 AM - 12:00 PM.   In my time away  from the office I am teaching new APP's within the system and am unavailable, but my partner, Dr. Burnard Bunting is in the office for emergent needs.   If you have questions or concerns, please call our office at [phone] or send [place] a MyChart message and we will respond as quickly as possible.  ____________________________________________________________ MyChart:  For all urgent or time sensitive needs we ask that you please call the office to avoid delays. Our number is [phone]. MyChart is not constantly monitored and due to the large volume of messages a day, replies may take up to 72 business hours.  MyChart Policy: MyChart allows for you to see your visit notes, after visit summary, provider recommendations, lab and tests results, make an appointment, request refills, and contact your provider or the office for non-urgent questions or concerns. Providers are seeing patients during normal business hours and do not have built in time to review MyChart messages.  We ask that you allow a minimum of 3 business days for responses to [employer]. For this reason, please do not send urgent requests through [place]. Please call the office at [phone]. New and ongoing conditions may require a visit. We have virtual and in person visit available for your convenience.  Complex MyChart concerns may require a visit. Your provider may request you schedule a virtual or in person visit to ensure we are providing the best care possible. MyChart messages sent after 11:00 AM on  Friday will not be received by the provider until Monday morning.    Lab and Test Results: You will receive your lab and test results on MyChart as soon as they are completed and results have been sent by the lab or testing facility. Due to this service, you will receive your results BEFORE your provider.  I review lab and tests results each morning prior to seeing patients. Some results require collaboration with other providers  to ensure you are receiving the most appropriate care. For this reason, we ask that you please allow a minimum of 3-5 business days from the time the ALL results have been received for your provider to receive and review lab and test results and contact you about these.  Most lab and test result comments from the provider will be sent through [place]. Your provider may recommend changes to the plan of care, follow-up visits, repeat testing, ask questions, or request an office visit to discuss these results. You may reply directly to this message or call the office at [phone] to provide information for the provider or set up an appointment. In some instances, you will be called with test results and recommendations. Please let us know if this is preferred and we will make note of this in your chart to provide this for you.    If you have not heard a response to your lab or test results in 5 business days from all results returning to [place], please call the office to let us know. We ask that you please avoid calling prior to this time unless there is an emergent concern. Due to high call volumes, this can delay the resulting process.  After Hours: For all non-emergency after hours needs, please call the office at [phone] and select the option to reach the on-call provider service. On-call services are shared between multiple [place] offices and therefore it will not be possible to speak directly with your provider. On-call providers may provide medical advice and recommendations, but are unable to provide refills for maintenance medications.  For all emergency or urgent medical needs after normal business hours, we recommend that you seek care at the closest Urgent Care or Emergency Department to ensure appropriate treatment in a timely manner.  [facility] has a 24 hour emergency room located on the ground floor for your convenience.   Urgent Concerns During the  Business Day Providers are seeing patients from 8AM to [place] with a busy schedule and are most often not able to respond to non-urgent calls until the end of the day or the next business day. If you should have URGENT concerns during the day, please call and speak to the nurse or schedule a same day appointment so that we can address your concern without delay.   Thank you, again, for choosing me as your health care partner. I appreciate your trust and look forward to learning more about you.   Worthy Keeler, DNP, AGNP-c ___________________________________________________________  Health Maintenance Recommendations Screening Testing Mammogram Every 1 -2 years based on history and risk factors Starting at age 64 Pap Smear Ages 21-39 every 3 years Ages 44-65 every 5 years with HPV testing More frequent testing may be required based on results and history Colon Cancer Screening Every 1-10 years based on test performed, risk factors, and history Starting at age 11 Bone Density Screening Every 2-10 years based on history Starting at age 35 for women Recommendations for men differ based on medication usage, history,  and risk factors AAA Screening One time ultrasound Men 75-43 years old who have every smoked Lung Cancer Screening Low Dose Lung CT every 12 months Age 89-80 years with a 30 pack-year smoking history who still smoke or who have quit within the last 15 years  Screening Labs Routine  Labs: Complete Blood Count (CBC), Complete Metabolic Panel (CMP), Cholesterol (Lipid Panel) Every 6-12 months based on history and medications May be recommended more frequently based on current conditions or previous results Hemoglobin A1c Lab Every 3-12 months based on history and previous results Starting at age 28 or earlier with diagnosis of diabetes, high cholesterol, BMI >26, and/or risk factors Frequent monitoring for patients with diabetes to ensure blood sugar control Thyroid Panel  (TSH w/ T3 & T4) Every 6 months based on history, symptoms, and risk factors May be repeated more often if on medication HIV One time testing for all patients 73 and older May be repeated more frequently for patients with increased risk factors or exposure Hepatitis C One time testing for all patients 12 and older May be repeated more frequently for patients with increased risk factors or exposure Gonorrhea, Chlamydia Every 12 months for all sexually active persons 13-24 years Additional monitoring may be recommended for those who are considered high risk or who have symptoms PSA Men 73-40 years old with risk factors Additional screening may be recommended from age 1-69 based on risk factors, symptoms, and history  Vaccine Recommendations Tetanus Booster All adults every 10 years Flu Vaccine All patients 6 months and older every year COVID Vaccine All patients 12 years and older Initial dosing with booster May recommend additional booster based on age and health history HPV Vaccine 2 doses all patients age 36-26 Dosing may be considered for patients over 26 Shingles Vaccine (Shingrix) 2 doses all adults 37 years and older Pneumonia (Pneumovax 23) All adults 40 years and older May recommend earlier dosing based on health history Pneumonia (Prevnar 61) All adults 67 years and older Dosed 1 year after Pneumovax 23  Additional Screening, Testing, and Vaccinations may be recommended on an individualized basis based on family history, health history, risk factors, and/or exposure.  __________________________________________________________  Diet Recommendations for All Patients  I recommend that all patients maintain a diet low in saturated fats, carbohydrates, and cholesterol. While this can be challenging at first, it is not impossible and small changes can make big differences.  Things to try: Decreasing the amount of soda, sweet tea, and/or juice to one or less per day and  replace with water While water is always the first choice, if you do not like water you may consider adding a water additive without sugar to improve the taste other sugar free drinks Replace potatoes with a brightly colored vegetable at dinner Use healthy oils, such as canola oil or olive oil, instead of butter or hard margarine Limit your bread intake to two pieces or less a day Replace regular pasta with low carb pasta options Bake, broil, or grill foods instead of frying Monitor portion sizes  Eat smaller, more frequent meals throughout the day instead of large meals  An important thing to remember is, if you love foods that are not great for your health, you don't have to give them up completely. Instead, allow these foods to be a reward when you have done well. Allowing yourself to still have special treats every once in a while is a nice way to tell yourself thank you for working hard to  keep yourself healthy.   Also remember that every day is a new day. If you have a bad day and "fall off the wagon", you can still climb right back up and keep moving along on your journey!  We have resources available to help you!  Some websites that may be helpful include: www.http://carter.biz/  Www.VeryWellFit.com _____________________________________________________________  Activity Recommendations for All Patients  I recommend that all adults get at least 20 minutes of moderate physical activity that elevates your heart rate at least 5 days out of the week.  Some examples include: Walking or jogging at a pace that allows you to carry on a conversation Cycling (stationary bike or outdoors) Water aerobics Yoga Weight lifting Dancing If physical limitations prevent you from putting stress on your joints, exercise in a pool or seated in a chair are excellent options.  Do determine your MAXIMUM heart rate for activity: YOUR AGE - 220 = MAX HeartRate   Remember! Do not push yourself too hard.   Start slowly and build up your pace, speed, weight, time in exercise, etc.  Allow your body to rest between exercise and get good sleep. You will need more water than normal when you are exerting yourself. Do not wait until you are thirsty to drink. Drink with a purpose of getting in at least 8, 8 ounce glasses of water a day plus more depending on how much you exercise and sweat.    If you begin to develop dizziness, chest pain, abdominal pain, jaw pain, shortness of breath, headache, vision changes, lightheadedness, or other concerning symptoms, stop the activity and allow your body to rest. If your symptoms are severe, seek emergency evaluation immediately. If your symptoms are concerning, but not severe, please let us know so that we can recommend further evaluation.

## 2021-02-17 NOTE — Progress Notes (Signed)
Orma Render, DNP, AGNP-c Primary Care & Sports Medicine [address]  [place], [place] [address] [phone]  New patient visit   Patient: Cynthia Cook   DOB: 03/27/1956   65 y.o. Female  MRN: [id number] Visit Date: 02/17/2021  Patient Care Team: Virgia Kelner, Coralee Pesa, NP as PCP - General (Nurse Practitioner) Buford Dresser, MD as PCP - Cardiology (Cardiology)  Today's healthcare provider: Orma Render, NP   Chief Complaint  Patient presents with   Transitions Of Care    Patient is wanting to establishing as her physician is no longer in clinic. Only concern at this time is a lump on the front of her left leg. She feels that she needs a complete blood panel done. No refills are needed on medications at this time.    Subjective    Cynthia Cook is a 65 y.o. female who presents today as a new patient to establish care.  HPI Delorse has had a recent stroke and is interested in hyperbaric oxygen therapy as part of treatment for this. She reports that she has researched the use of this treatment for recovery from CVA and is interested in the benefits for herself. She is still suffering with slurred speech and motor deficits at this time.  She tells me she has not seen Dr. Leonie Man (neurologist) for follow-up since getting out of the hospital. She has an appointment on the 17th. She reports that she sent a [facility] message about the HBOT to his office, but he informed her that this was not something he prescribed and recommended that she contact her PCP for referral.   Shataya is also concerned about a "lump" on the anterior surface of her left shin that she noticed recently. She reports she is not sure when the area showed up, but she was scratched on her legs by her dog and noticed that the area was tender when she was applying lotion. She does not feel the lump is associated with the scratches or injury from the dog. She does have a history of sclerosed veins  in her lower extremities. She reports the area is not warm or changing since she initially noticed it. There has been no erythema or drainage present. It is only tender with palpation. No pain with ambulation. No injury known to the area aside from scratch.   Past Medical History:  Diagnosis Date   Back pain    Gout    Murmur    PE (pulmonary embolism)    Persistent atrial fibrillation (HCC)    Pulmonary emboli (HCC) 02/05/2020   Syncope    Thyroid disease    History reviewed. No pertinent surgical history. No family status information on file.   Family History  Family history unknown: Yes   Social History   Socioeconomic History   Marital status: Divorced    Spouse name: Not on file   Number of children: Not on file   Years of education: Not on file   Highest education level: Not on file  Occupational History    Comment: retired Therapist, sports  Tobacco Use   Smoking status: Never   Smokeless tobacco: Never  Vaping Use   Vaping Use: Never used  Substance and Sexual Activity   Alcohol use: No   Drug use: Never   Sexual activity: Not on file  Other Topics Concern   Not on file  Social History Narrative   Lives with her dog, she has no family members. 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   Social Determinants of Health   Financial Resource Strain: Low Risk    Difficulty of Paying Living Expenses: Not very hard  Food Insecurity: No Food Insecurity   Worried About Charity fundraiser in the Last Year: Never true   Arboriculturist in the Last Year: Never true  Transportation Needs: Unmet Transportation Needs   Lack of Transportation (Medical): Yes   Lack of Transportation (Non-Medical): Yes  Physical Activity: Not on file  Stress: Not on file  Social Connections: Not on file   Outpatient Medications Prior to Visit  Medication Sig   apixaban (ELIQUIS) 5 MG TABS tablet Take 1 tablet (5 mg total) by mouth 2 (two) times daily.   ascorbic acid (VITAMIN C)  500 MG tablet Take 500 mg by mouth daily.   diltiazem (CARDIZEM) 30 MG tablet Take 1 tablet (30 mg total) by mouth as needed (heart rate persistently more than 110bpm).   ezetimibe (ZETIA) 10 MG tablet Take 1 tablet (10 mg total) by mouth daily.   triamcinolone cream (KENALOG) 0.1 % Apply 1 application topically daily as needed (rash).    Vitamin D-Vitamin K (VITAMIN K2-VITAMIN D3 PO) Take 1 tablet by mouth daily.    vitamin E 180 MG (400 UNITS) capsule Take 400 Units by mouth daily.   No facility-administered medications prior to visit.   Allergies  Allergen Reactions   Codeine Nausea Only   Fentanyl Nausea And Vomiting    Dizziness, lightheaded      There is no immunization history on file for this patient.  Health Maintenance  Topic Date Due   COVID-19 Vaccine (1) Never done   Pneumonia Vaccine 20+ Years old (1 - PCV) Never done   Hepatitis C Screening  Never done   TETANUS/TDAP  Never done   Zoster Vaccines- Shingrix (1 of 2) Never done   COLONOSCOPY (Pts 45-59yrs Insurance coverage will need to be confirmed)  Never done   MAMMOGRAM  Never done   DEXA SCAN  Never done   INFLUENZA VACCINE  Never done   HPV VACCINES  Aged Out    Patient Care Team: Dewitt Judice, Coralee Pesa, NP as PCP - General (Nurse Practitioner) Buford Dresser, MD as PCP - Cardiology (Cardiology)  Review of Systems All review of systems negative except what is listed in the HPI    Objective    BP 122/82   Pulse 93   Ht 5\' 6"  (1.676 m)   Wt 211 lb (95.7 kg)   SpO2 97%   BMI 34.06 kg/m  Physical Exam Vitals and nursing note reviewed.  Constitutional:      General: She is not in acute distress.    Appearance: Normal appearance.  Eyes:     Extraocular Movements: Extraocular movements intact.     Conjunctiva/sclera: Conjunctivae normal.     Pupils: Pupils are equal, round, and reactive to light.  Neck:     Vascular: No carotid bruit.  Cardiovascular:     Rate and Rhythm: Normal rate and  regular rhythm.     Pulses: Normal pulses.     Heart sounds: Murmur heard.  Pulmonary:     Effort: Pulmonary effort is normal.     Breath sounds: Normal breath sounds. No wheezing.  Abdominal:     General: Bowel sounds are normal.     Palpations: Abdomen is soft.  Musculoskeletal:        General: Normal range  of motion.     Cervical back: Normal range of motion.     Right lower leg: No edema.     Left lower leg: No edema.  Skin:    General: Skin is warm and dry.     Capillary Refill: Capillary refill takes less than 2 seconds.     Findings: Abrasion present.          Comments: Scattered abrasions to the left anterior shin with no ecchymosis, drainage, or warmth present consistent with dog scratches.  Left leg with sclerotic texture to deep layers of the skin on anterior and posterior calf in large plaque-like pieces. Skin texture similar to surrounding skin with no changes in temperature or coloration.   Neurological:     General: No focal deficit present.     Mental Status: She is alert and oriented to person, place, and time.  Psychiatric:        Mood and Affect: Mood normal.        Behavior: Behavior normal.        Thought Content: Thought content normal.        Judgment: Judgment normal.   Depression Screen PHQ 2/9 Scores 02/18/2021  PHQ - 2 Score 1  PHQ- 9 Score 5   No results found for any visits on 02/17/21.  Assessment & Plan      Problem List Items Addressed This Visit     Hashimoto's thyroiditis    Will monitor labs today      Acute CVA (cerebrovascular accident) [facility])    Recent CVA with hospitalization and subsequent therapy.  Continues to have gait disturbance and slurred speech.  Requesting hyperbaric oxygen therapy for treatment. Discussed with patient that this is not a common treatment option that I am familiar with for stroke-like symptoms.  Will plan to review the literature to see if this is an option and recommended treatment.  Neurology  reportedly not familiar with the treatment modality for CVA symptoms per patient discussion.  If treatment is not a recommended therapy, it is likely that insurance will not cover the costs, but will review to determine if this is something that can be ordered.  Will make determination based on literature review.       CKD (chronic kidney disease) stage 3, GFR 30-59 ml/min (HCC) - baseline SCr 1.3    Will monitor labs today      Encounter for medical examination to establish care - Primary    Review of current and past medical history, social history, medication, and family history.  Review of care gaps and health maintenance recommendations.  Records from recent providers to be requested if not available in Chart Review or Care Everywhere.  Recommendations for health maintenance, diet, and exercise provided. CPE due: AWV/CPE in 3 months       Injury of left shin    Nodularity noted to anterior surface of left shin today. Etiology unknown.  Does not appear to be infectious in origin. No concerns for blood clot.  Likely due to injury to the area.  Recommend monitoring for growth or changes and notifying if these present.  We can always get an ultrasound for further evaluation if changes or no healing is noted.        Hypothyroidism   Relevant Orders   TSH   Other Visit Diagnoses     Zinc deficiency       Relevant Orders   Zinc   Copper deficiency  Relevant Orders   Copper, serum        Return for AWV 3-6 months.    Time: 60 minutes, >50% spent counseling, care coordination, chart review, and documentation.    Evynn Boutelle, Coralee Pesa, NP, DNP, AGNP-C Primary Care [facility] Medicine at [place]

## 2021-02-18 DIAGNOSIS — N1831 Chronic kidney disease, stage 3a: Secondary | ICD-10-CM | POA: Diagnosis not present

## 2021-02-18 DIAGNOSIS — E785 Hyperlipidemia, unspecified: Secondary | ICD-10-CM | POA: Diagnosis not present

## 2021-02-18 DIAGNOSIS — M1030 Gout due to renal impairment, unspecified site: Secondary | ICD-10-CM | POA: Diagnosis not present

## 2021-02-18 DIAGNOSIS — Z9181 History of falling: Secondary | ICD-10-CM | POA: Diagnosis not present

## 2021-02-18 DIAGNOSIS — I69322 Dysarthria following cerebral infarction: Secondary | ICD-10-CM | POA: Diagnosis not present

## 2021-02-18 DIAGNOSIS — Z86711 Personal history of pulmonary embolism: Secondary | ICD-10-CM | POA: Diagnosis not present

## 2021-02-18 DIAGNOSIS — I6781 Acute cerebrovascular insufficiency: Secondary | ICD-10-CM | POA: Diagnosis not present

## 2021-02-18 DIAGNOSIS — Z7901 Long term (current) use of anticoagulants: Secondary | ICD-10-CM | POA: Diagnosis not present

## 2021-02-18 DIAGNOSIS — I5189 Other ill-defined heart diseases: Secondary | ICD-10-CM | POA: Diagnosis not present

## 2021-02-18 DIAGNOSIS — I050 Rheumatic mitral stenosis: Secondary | ICD-10-CM | POA: Diagnosis not present

## 2021-02-18 DIAGNOSIS — E039 Hypothyroidism, unspecified: Secondary | ICD-10-CM | POA: Diagnosis not present

## 2021-02-18 DIAGNOSIS — R011 Cardiac murmur, unspecified: Secondary | ICD-10-CM | POA: Diagnosis not present

## 2021-02-18 DIAGNOSIS — I480 Paroxysmal atrial fibrillation: Secondary | ICD-10-CM | POA: Diagnosis not present

## 2021-02-18 NOTE — Assessment & Plan Note (Signed)
Nodularity noted to anterior surface of left shin today. Etiology unknown.  Does not appear to be infectious in origin. No concerns for blood clot.  Likely due to injury to the area.  Recommend monitoring for growth or changes and notifying if these present.  We can always get an ultrasound for further evaluation if changes or no healing is noted.

## 2021-02-18 NOTE — Assessment & Plan Note (Signed)
Will monitor labs today.  

## 2021-02-18 NOTE — Assessment & Plan Note (Signed)
Review of current and past medical history, social history, medication, and family history.  Review of care gaps and health maintenance recommendations.  Records from recent providers to be requested if not available in Chart Review or Care Everywhere.  Recommendations for health maintenance, diet, and exercise provided. CPE due: AWV/CPE in 3 months

## 2021-02-18 NOTE — Assessment & Plan Note (Signed)
Recent CVA with hospitalization and subsequent therapy.  Continues to have gait disturbance and slurred speech.  Requesting hyperbaric oxygen therapy for treatment. Discussed with patient that this is not a common treatment option that I am familiar with for stroke-like symptoms.  Will plan to review the literature to see if this is an option and recommended treatment.  Neurology reportedly not familiar with the treatment modality for CVA symptoms per patient discussion.  If treatment is not a recommended therapy, it is likely that insurance will not cover the costs, but will review to determine if this is something that can be ordered.  Will make determination based on literature review.

## 2021-02-19 LAB — TSH: TSH: 4.67 u[IU]/mL — ABNORMAL HIGH (ref 0.450–4.500)

## 2021-02-19 LAB — ZINC: Zinc: 74 ug/dL (ref 44–115)

## 2021-02-21 ENCOUNTER — Telehealth (HOSPITAL_BASED_OUTPATIENT_CLINIC_OR_DEPARTMENT_OTHER): Payer: Self-pay

## 2021-02-21 NOTE — Telephone Encounter (Signed)
Shelda Pal, Speech Patholohist, is calling to have a verbal order for this patient to have speech therapy once weekly for 9 weeks for dysarthria  Will forward to Worthy Keeler, DNP for advisement

## 2021-02-22 ENCOUNTER — Encounter: Payer: Self-pay | Admitting: Adult Health

## 2021-02-22 ENCOUNTER — Encounter (HOSPITAL_BASED_OUTPATIENT_CLINIC_OR_DEPARTMENT_OTHER): Payer: Self-pay | Admitting: Nurse Practitioner

## 2021-02-22 NOTE — Telephone Encounter (Signed)
Called Cynthia Cook and left voicemail of verbal order being granted Informed Cynthia Cook to contact the office if any further information is needed

## 2021-02-22 NOTE — Telephone Encounter (Signed)
Can reschedule if she would prefer until she her knees are feeling better but I can also make note of knee pain altering evaluation

## 2021-02-23 ENCOUNTER — Telehealth: Payer: Self-pay | Admitting: Neurology

## 2021-02-23 DIAGNOSIS — Z9181 History of falling: Secondary | ICD-10-CM | POA: Diagnosis not present

## 2021-02-23 DIAGNOSIS — I050 Rheumatic mitral stenosis: Secondary | ICD-10-CM | POA: Diagnosis not present

## 2021-02-23 DIAGNOSIS — Z7901 Long term (current) use of anticoagulants: Secondary | ICD-10-CM | POA: Diagnosis not present

## 2021-02-23 DIAGNOSIS — E039 Hypothyroidism, unspecified: Secondary | ICD-10-CM | POA: Diagnosis not present

## 2021-02-23 DIAGNOSIS — I5189 Other ill-defined heart diseases: Secondary | ICD-10-CM | POA: Diagnosis not present

## 2021-02-23 DIAGNOSIS — E785 Hyperlipidemia, unspecified: Secondary | ICD-10-CM | POA: Diagnosis not present

## 2021-02-23 DIAGNOSIS — I6781 Acute cerebrovascular insufficiency: Secondary | ICD-10-CM | POA: Diagnosis not present

## 2021-02-23 DIAGNOSIS — N1831 Chronic kidney disease, stage 3a: Secondary | ICD-10-CM | POA: Diagnosis not present

## 2021-02-23 DIAGNOSIS — I69322 Dysarthria following cerebral infarction: Secondary | ICD-10-CM | POA: Diagnosis not present

## 2021-02-23 DIAGNOSIS — Z86711 Personal history of pulmonary embolism: Secondary | ICD-10-CM | POA: Diagnosis not present

## 2021-02-23 DIAGNOSIS — I480 Paroxysmal atrial fibrillation: Secondary | ICD-10-CM | POA: Diagnosis not present

## 2021-02-23 DIAGNOSIS — M1030 Gout due to renal impairment, unspecified site: Secondary | ICD-10-CM | POA: Diagnosis not present

## 2021-02-23 DIAGNOSIS — R011 Cardiac murmur, unspecified: Secondary | ICD-10-CM | POA: Diagnosis not present

## 2021-02-23 NOTE — Telephone Encounter (Signed)
Kelly with [facility] is asking for a call back at [phone]

## 2021-02-23 NOTE — Telephone Encounter (Signed)
Left message requesting a call back.   CVA 01/03/21 - seen in hospital by Dr. Leonie Man.  Appt to establish care w/ our office on 02/24/21 (w/ Frann Rider, NP).

## 2021-02-23 NOTE — Telephone Encounter (Signed)
Attempted to reach Cynthia Cook again. Left our office hours and number to call back.

## 2021-02-23 NOTE — Telephone Encounter (Signed)
LVM returning [facility] call.

## 2021-02-23 NOTE — Telephone Encounter (Signed)
Cynthia Cook from [facility] called stating that the pt states that they do not notice any changes and would like to be discharged from Home Physical Therapy. Please advise.

## 2021-02-24 ENCOUNTER — Encounter: Payer: Self-pay | Admitting: Adult Health

## 2021-02-24 ENCOUNTER — Ambulatory Visit: Payer: Medicare Other | Admitting: Adult Health

## 2021-02-24 VITALS — BP 164/87 | HR 92 | Ht 66.0 in | Wt 216.0 lb

## 2021-02-24 DIAGNOSIS — I480 Paroxysmal atrial fibrillation: Secondary | ICD-10-CM | POA: Diagnosis not present

## 2021-02-24 DIAGNOSIS — I69322 Dysarthria following cerebral infarction: Secondary | ICD-10-CM | POA: Diagnosis not present

## 2021-02-24 DIAGNOSIS — I6300 Cerebral infarction due to thrombosis of unspecified precerebral artery: Secondary | ICD-10-CM

## 2021-02-24 DIAGNOSIS — R29898 Other symptoms and signs involving the musculoskeletal system: Secondary | ICD-10-CM

## 2021-02-24 DIAGNOSIS — R269 Unspecified abnormalities of gait and mobility: Secondary | ICD-10-CM

## 2021-02-24 NOTE — Telephone Encounter (Signed)
Per vo by Dr. Leonie Man, okay to discharge from PT if no improvement.  I spoke to [place]. Patient was not improving. Patent also expressed that she did not want to continue PT. Services will be discontinued.

## 2021-02-24 NOTE — Patient Instructions (Signed)
We will look into starting therapies - they will you to start appointments   Continue Eliquis (apixaban) daily  and Zetia 10mg  daily  for secondary stroke prevention  Continue to follow up with PCP regarding cholesterol and blood pressure management  Maintain strict control of hypertension with blood pressure goal below 130/90 and cholesterol with LDL cholesterol (bad cholesterol) goal below 70 mg/dL.       Followup in the future with me in 4 months or call earlier if needed       Thank you for coming to see [place] at [facility]. I hope we have been able to provide you high quality care today.  You may receive a patient satisfaction survey over the next few weeks. We would appreciate your feedback and comments so that we may continue to improve ourselves and the health of our patients.   Stroke Prevention Some medical conditions and lifestyle choices can lead to a higher risk for a stroke. You can help to prevent a stroke by eating healthy foods and exercising. It also helps to not smoke and to manage any health problems you may have. How can this condition affect me? A stroke is an emergency. It should be treated right away. A stroke can lead to brain damage or threaten your life. There is a better chance of surviving and getting better after a stroke if you get medical help right away. What can increase my risk? The following medical conditions may increase your risk of a stroke: Diseases of the heart and blood vessels (cardiovascular disease). High blood pressure (hypertension). Diabetes. High cholesterol. Sickle cell disease. Problems with blood clotting. Being very overweight. Sleeping problems (obstructivesleep apnea). Other risk factors include: Being older than age 79. A history of blood clots, stroke, or mini-stroke (TIA). Race, ethnic background, or a family history of stroke. Smoking or using tobacco products. Taking birth control pills, especially  if you smoke. Heavy alcohol and drug use. Not being active. What actions can I take to prevent this? Manage your health conditions High cholesterol. Eat a healthy diet. If this is not enough to manage your cholesterol, you may need to take medicines. Take medicines as told by your doctor. High blood pressure. Try to keep your blood pressure below 130/80. If your blood pressure cannot be managed through a healthy diet and regular exercise, you may need to take medicines. Take medicines as told by your doctor. Ask your doctor if you should check your blood pressure at home. Have your blood pressure checked every year. Diabetes. Eat a healthy diet and get regular exercise. If your blood sugar (glucose) cannot be managed through diet and exercise, you may need to take medicines. Take medicines as told by your doctor. Talk to your doctor about getting checked for sleeping problems. Signs of a problem can include: Snoring a lot. Feeling very tired. Make sure that you manage any other conditions you have. Nutrition  Follow instructions from your doctor about what to eat or drink. You may be told to: Eat and drink fewer calories each day. Limit how much salt (sodium) you use to 1,500 milligrams (mg) each day. Use only healthy fats for cooking, such as olive oil, canola oil, and sunflower oil. Eat healthy foods. To do this: Choose foods that are high in fiber. These include whole grains, and fresh fruits and vegetables. Eat at least 5 servings of fruits and vegetables a day. Try to fill one-half of your plate with fruits and vegetables at  each meal. Choose low-fat (lean) proteins. These include low-fat cuts of meat, chicken without skin, fish, tofu, beans, and nuts. Eat low-fat dairy products. Avoid foods that: Are high in salt. Have saturated fat. Have trans fat. Have cholesterol. Are processed or pre-made. Count how many carbohydrates you eat and drink each day. Lifestyle If you  drink alcohol: Limit how much you have to: 0-1 drink a day for women who are not pregnant. 0-2 drinks a day for men. Know how much alcohol is in your drink. In the [place] one drink equals one 12 oz bottle of beer (3101mL), one 5 oz glass of wine (156mL), or one 1 oz glass of hard liquor (37mL). Do not smoke or use any products that have nicotine or tobacco. If you need help quitting, ask your doctor. Avoid secondhand smoke. Do not use drugs. Activity  Try to stay at a healthy weight. Get at least 30 minutes of exercise on most days, such as: Fast walking. Biking. Swimming. Medicines Take over-the-counter and prescription medicines only as told by your doctor. Avoid taking birth control pills. Talk to your doctor about the risks of taking birth control pills if: You are over 45 years old. You smoke. You get very bad headaches. You have had a blood clot. Where to find more information American Stroke Association: www.strokeassociation.org Get help right away if: You or a loved one has any signs of a stroke. "BE FAST" is an easy way to remember the warning signs: B - Balance. Dizziness, sudden trouble walking, or loss of balance. E - Eyes. Trouble seeing or a change in how you see. F - Face. Sudden weakness or loss of feeling of the face. The face or eyelid may droop on one side. A - Arms. Weakness or loss of feeling in an arm. This happens all of a sudden and most often on one side of the body. S - Speech. Sudden trouble speaking, slurred speech, or trouble understanding what people say. T - Time. Time to call emergency services. Write down what time symptoms started. You or a loved one has other signs of a stroke, such as: A sudden, very bad headache with no known cause. Feeling like you may vomit (nausea). Vomiting. A seizure. These symptoms may be an emergency. Get help right away. Call your local emergency services (911 in the [place] Do not wait to see if the symptoms will go  away. Do not drive yourself to the hospital. Summary You can help to prevent a stroke by eating healthy, exercising, and not smoking. It also helps to manage any health problems you have. Do not smoke or use any products that contain nicotine or tobacco. Get help right away if you or a loved one has any signs of a stroke. This information is not intended to replace advice given to you by your health care provider. Make sure you discuss any questions you have with your health care provider. Document Revised: 10/27/2019 Document Reviewed: 10/27/2019 Elsevier Patient Education  [place].

## 2021-02-24 NOTE — Progress Notes (Signed)
[facility] [address] [place]. [place] [address] [phone]       [place]. [name] Date of Birth:  Oct 27, 1955 Medical Record Number:  [id number]   Reason for Referral:  hospital stroke follow up    SUBJECTIVE:   CHIEF COMPLAINT:  Chief Complaint  Patient presents with   Follow-up    Rm  3 alone  Pt states her balance has been affected and R sided weakness  She hasn't had any improvement     HPI:   Cynthia Cook is a 65 y.o. female for Afib s/p ablation (unable to afford eliquis and urethral and nose bleed on warfarin in the past), hx of PE, hypothyroidism, gout, hx of orthostatic syncope who presented to [facility] on 01/03/2021 with chief complaint of R facial droop and slurred speech for 2 days prior to presentation. Personally reviewed hospitalization pertinent progress notes, lab work and imaging.  Evaluated by Dr. Leonie Man for left centrum semiovale infarct lacunar secondary to small vessel disease.  Imaging also notable only for infarcts in the bilateral basal ganglia and cerebellar hemispheres.  MRA unremarkable.  Carotid Doppler bilateral ICA 1 to 39% stenosis.  EF 55 to 60% with left atrium moderately dilated.  History of A. fib not on anticoagulation due to financial reasons. Per note, if able to afford Eliquis and recommended starting otherwise recommended aspirin and Plavix for 3 weeks and aspirin alone. She was able to be placed on Eliquis prior to discharge.  LDL 136 - started Zetia 10mg  daily (declines statin use).  A1c 5.5.  TSH slightly elevated at 6.3.  Therapy eval's initially recommended CIR but patient declined and discharged home with home health services.   Today, 02/24/2021, [facility] is being seen for hospital follow-up unaccompanied.   Reports continued imbalance, dysarthria and right hand incoordination.  Working with home health therapies but denies any benefit and she recently told them to  stop coming. She ambulates with a cane which she was using PTA. Denies any recent falls. She is interested in doing outpatient therapies but is concerned regarding transportation - she has not yet returned back to driving - was not specifically told not to drive but did not feel like she should be driving initially after leaving the hospital. Lives alone and able to maintain ADLs and majority of IADLs independently.   Compliant on Eliquis and zetia without side effects Blood pressure today 164/87 - reports typically elevated at appointments- at home 130s-140s/70s/80s She has since had follow-up with both cardiology and PCP since discharge  No further concerns at this time     [facility] Stroke CT head No acute abnormality. ASPECTS: 9 MRI  Small early subacute infarct in the left centrum  semiovale.Remote lacunar infarcts in the bilateral basal ganglia and cerebellar hemispheres. MRA  Patent intracranial vasculature, with no hemodynamically significant stenosis or occlusion.  Carotid Doppler  RICA and LICA 6-07% stenosis, subclavians: normal flow, vertebral arteries: antegrade flow 2D Echo ejection fraction 55 to 60%.  Left atrium moderately dilated.   LDL 136 HgbA1c 5.5    ROS:   14 system review of systems performed and negative with exception of those listed in HPI  PMH:  Past Medical History:  Diagnosis Date   Back pain    Gout    Murmur    PE (pulmonary embolism)    Persistent atrial fibrillation (HCC)    Pulmonary emboli ([place]) 02/05/2020   Syncope  Thyroid disease     PSH: History reviewed. No pertinent surgical history.  Social History:  Social History   Socioeconomic History   Marital status: Divorced    Spouse name: Not on file   Number of children: Not on file   Years of education: Not on file   Highest education level: Not on file  Occupational History    Comment: retired Therapist, sports  Tobacco Use   Smoking status: Never   Smokeless tobacco: Never   Vaping Use   Vaping Use: Never used  Substance and Sexual Activity   Alcohol use: No   Drug use: Never   Sexual activity: Not on file  Other Topics Concern   Not on file  Social History Narrative   Lives with her dog, she has no family members. 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   Social Determinants of Health   Financial Resource Strain: Low Risk    Difficulty of Paying Living Expenses: Not very hard  Food Insecurity: No Food Insecurity   Worried About Charity fundraiser in the Last Year: Never true   Arboriculturist in the Last Year: Never true  Transportation Needs: Public librarian (Medical): Yes   Lack of Transportation (Non-Medical): Yes  Physical Activity: Not on file  Stress: Not on file  Social Connections: Not on file  Intimate Partner Violence: Not on file    Family History:  Family History  Family history unknown: Yes    Medications:   Current Outpatient Medications on File Prior to Visit  Medication Sig Dispense Refill   apixaban (ELIQUIS) 5 MG TABS tablet Take 1 tablet (5 mg total) by mouth 2 (two) times daily. 60 tablet 5   ascorbic acid (VITAMIN C) 500 MG tablet Take 500 mg by mouth daily.     diltiazem (CARDIZEM) 30 MG tablet Take 1 tablet (30 mg total) by mouth as needed (heart rate persistently more than 110bpm). 30 tablet 2   ezetimibe (ZETIA) 10 MG tablet Take 1 tablet (10 mg total) by mouth daily. 90 tablet 3   triamcinolone cream (KENALOG) 0.1 % Apply 1 application topically daily as needed (rash).      Vitamin D-Vitamin K (VITAMIN K2-VITAMIN D3 PO) Take 1 tablet by mouth daily.      vitamin E 180 MG (400 UNITS) capsule Take 400 Units by mouth daily.     No current facility-administered medications on file prior to visit.    Allergies:   Allergies  Allergen Reactions   Codeine Nausea Only   Fentanyl Nausea And Vomiting    Dizziness, lightheaded       OBJECTIVE:  Physical  Exam  Vitals:   02/24/21 1314  BP: (!) 164/87  Pulse: 92  Weight: 216 lb (98 kg)  Height: 5\' 6"  (1.676 m)   Body mass index is 34.86 kg/m. No results found.  General: well developed, well nourished, elderly Caucasian female, seated, in no evident distress Head: head normocephalic and atraumatic.   Neck: supple with no carotid or supraclavicular bruits Cardiovascular: regular rate and rhythm, no murmurs Musculoskeletal: no deformity Skin:  no rash/petichiae Vascular:  Normal pulses all extremities   Neurologic Exam Mental Status: Awake and fully alert.  Mild dysarthria.  No evidence of aphasia.  Oriented to place and time. Recent and remote memory intact. Attention span, concentration and fund of knowledge appropriate. Mood and affect appropriate.  Cranial Nerves: Fundoscopic exam reveals sharp  disc margins. Pupils equal, briskly reactive to light. Extraocular movements full without nystagmus. Visual fields full to confrontation. Hearing intact. Facial sensation intact.  Mild right lower facial weakness.  Tongue, palate moves normally and symmetrically.  Motor: Normal bulk and tone. Normal strength in all tested extremity muscles except mild decreased right hand dexterity Sensory.: intact to touch , pinprick , position and vibratory sensation.  Coordination: Rapid alternating movements normal in all extremities except slight decreased right hand. Finger-to-nose and heel-to-shin performed accurately bilaterally. Gait and Station: Arises from chair without difficulty. Stance is normal. Gait demonstrates normal stride length and mild imbalance/unsteadiness with use of cane. Tandem walk and heel toe not attempted.  Reflexes: 1+ and symmetric. Toes downgoing.     NIHSS  2 Modified Rankin  2      ASSESSMENT: Cynthia Cook is a 65 y.o. year old female with left centrum semiovale infarct secondary to small vessel disease vs A. Fib not on eliquis on 01/03/2021. Vascular risk factors  include HTN s/p ablation (unable to afford Eliquis and bleeding issues on warfarin in the past), hx of PE, hypothyroidism, HLD, prior strokes on imaging, advanced age and obesity.      PLAN:  Left centrum semiovale infarct:  Residual deficit: Mild dysarthria, decreased right hand dexterity and gait impairment.  Referral will be placed to outpatient PT/OT/SLP Continue Eliquis (apixaban) daily  and Zetia 10 mg daily for secondary stroke prevention.  Discussed secondary stroke prevention measures and importance of close PCP follow up for aggressive stroke risk factor management. I have gone over the pathophysiology of stroke, warning signs and symptoms, risk factors and their management in some detail with instructions to go to the closest emergency room for symptoms of concern. Atrial fibrillation: On Eliquis 5 mg twice daily for secondary stroke prevention measures managed by cardiology HTN: BP goal <130/90.  Elevated today although stable at home per pt on Cardizem per PCP/cardiology HLD: LDL goal <70. Recent LDL 136.  Continue Zetia 10 mg daily managed/monitored by PCP    Follow up in 4 months or call earlier if needed   CC:  [facility] provider: Dr. Leonie Man PCP: Early, Coralee Pesa, NP    I spent 56 minutes of face-to-face and non-face-to-face time with patient.  This included previsit chart review including review of recent hospitalization, lab review, study review, order entry, electronic health record documentation, patient education regarding recent stroke including potential etiology, secondary stroke prevention measures and importance of managing stroke risk factors, residual deficits and typical recovery time and answered all other questions to patient satisfaction  Frann Rider, AGNP-BC  [facility] [address] [place], [place] [address]  Phone [phone] Fax [phone] Note: This document was prepared with digital dictation and possible smart phrase  technology. Any transcriptional errors that result from this process are unintentional.

## 2021-02-27 ENCOUNTER — Encounter (HOSPITAL_BASED_OUTPATIENT_CLINIC_OR_DEPARTMENT_OTHER): Payer: Self-pay | Admitting: Nurse Practitioner

## 2021-02-27 LAB — SPECIMEN STATUS REPORT

## 2021-02-27 LAB — COPPER, SERUM: Copper: 119 ug/dL (ref 80–158)

## 2021-02-28 ENCOUNTER — Encounter (HOSPITAL_BASED_OUTPATIENT_CLINIC_OR_DEPARTMENT_OTHER): Payer: Self-pay

## 2021-03-01 DIAGNOSIS — M1030 Gout due to renal impairment, unspecified site: Secondary | ICD-10-CM | POA: Diagnosis not present

## 2021-03-01 DIAGNOSIS — I6781 Acute cerebrovascular insufficiency: Secondary | ICD-10-CM | POA: Diagnosis not present

## 2021-03-01 DIAGNOSIS — R011 Cardiac murmur, unspecified: Secondary | ICD-10-CM | POA: Diagnosis not present

## 2021-03-01 DIAGNOSIS — I69322 Dysarthria following cerebral infarction: Secondary | ICD-10-CM | POA: Diagnosis not present

## 2021-03-01 DIAGNOSIS — Z86711 Personal history of pulmonary embolism: Secondary | ICD-10-CM | POA: Diagnosis not present

## 2021-03-01 DIAGNOSIS — I050 Rheumatic mitral stenosis: Secondary | ICD-10-CM | POA: Diagnosis not present

## 2021-03-01 DIAGNOSIS — N1831 Chronic kidney disease, stage 3a: Secondary | ICD-10-CM | POA: Diagnosis not present

## 2021-03-01 DIAGNOSIS — E785 Hyperlipidemia, unspecified: Secondary | ICD-10-CM | POA: Diagnosis not present

## 2021-03-01 DIAGNOSIS — E039 Hypothyroidism, unspecified: Secondary | ICD-10-CM | POA: Diagnosis not present

## 2021-03-01 DIAGNOSIS — I5189 Other ill-defined heart diseases: Secondary | ICD-10-CM | POA: Diagnosis not present

## 2021-03-01 DIAGNOSIS — Z7901 Long term (current) use of anticoagulants: Secondary | ICD-10-CM | POA: Diagnosis not present

## 2021-03-01 DIAGNOSIS — I480 Paroxysmal atrial fibrillation: Secondary | ICD-10-CM | POA: Diagnosis not present

## 2021-03-01 DIAGNOSIS — Z9181 History of falling: Secondary | ICD-10-CM | POA: Diagnosis not present

## 2021-03-07 ENCOUNTER — Other Ambulatory Visit (HOSPITAL_COMMUNITY): Payer: Self-pay

## 2021-03-07 ENCOUNTER — Encounter (HOSPITAL_BASED_OUTPATIENT_CLINIC_OR_DEPARTMENT_OTHER): Payer: Self-pay | Admitting: Nurse Practitioner

## 2021-03-07 MED ORDER — LEVOTHYROXINE SODIUM 50 MCG PO TABS
ORAL_TABLET | ORAL | 1 refills | Status: DC
  Filled 2021-03-07: qty 34, 78d supply, fill #0
  Filled 2021-03-08: qty 39, 90d supply, fill #0

## 2021-03-07 MED ORDER — LEVOTHYROXINE SODIUM 75 MCG PO TABS
ORAL_TABLET | ORAL | 1 refills | Status: DC
  Filled 2021-03-07 – 2021-03-08 (×2): qty 45, 78d supply, fill #0

## 2021-03-08 ENCOUNTER — Other Ambulatory Visit (HOSPITAL_COMMUNITY): Payer: Self-pay

## 2021-03-09 ENCOUNTER — Other Ambulatory Visit (HOSPITAL_COMMUNITY): Payer: Self-pay

## 2021-03-11 ENCOUNTER — Encounter (HOSPITAL_BASED_OUTPATIENT_CLINIC_OR_DEPARTMENT_OTHER): Payer: Self-pay | Admitting: Nurse Practitioner

## 2021-03-11 ENCOUNTER — Other Ambulatory Visit (HOSPITAL_BASED_OUTPATIENT_CLINIC_OR_DEPARTMENT_OTHER): Payer: Self-pay | Admitting: Nurse Practitioner

## 2021-03-11 DIAGNOSIS — M109 Gout, unspecified: Secondary | ICD-10-CM

## 2021-03-11 MED ORDER — TRAMADOL HCL 50 MG PO TABS
50.0000 mg | ORAL_TABLET | Freq: Four times a day (QID) | ORAL | 0 refills | Status: AC | PRN
Start: 2021-03-11 — End: 2021-03-16

## 2021-03-11 MED ORDER — PREDNISONE 20 MG PO TABS: ORAL_TABLET | ORAL | 3 refills | Status: AC

## 2021-03-14 ENCOUNTER — Encounter (HOSPITAL_BASED_OUTPATIENT_CLINIC_OR_DEPARTMENT_OTHER): Payer: Self-pay

## 2021-03-14 NOTE — Telephone Encounter (Signed)
Please advise 

## 2021-03-16 ENCOUNTER — Other Ambulatory Visit (HOSPITAL_BASED_OUTPATIENT_CLINIC_OR_DEPARTMENT_OTHER): Payer: Self-pay | Admitting: Nurse Practitioner

## 2021-03-16 DIAGNOSIS — M109 Gout, unspecified: Secondary | ICD-10-CM

## 2021-03-16 MED ORDER — COLCHICINE 0.6 MG PO TABS: ORAL_TABLET | ORAL | 3 refills | Status: DC

## 2021-03-18 ENCOUNTER — Encounter (HOSPITAL_BASED_OUTPATIENT_CLINIC_OR_DEPARTMENT_OTHER): Payer: Self-pay

## 2021-03-18 ENCOUNTER — Encounter (HOSPITAL_BASED_OUTPATIENT_CLINIC_OR_DEPARTMENT_OTHER): Payer: Self-pay | Admitting: Nurse Practitioner

## 2021-03-18 NOTE — Telephone Encounter (Signed)
I looked this up and it looks like this only occurs in about .12% of people. Let me know if you think this could be causing her symptoms or if she should follow up with PCP.

## 2021-03-23 ENCOUNTER — Encounter (HOSPITAL_BASED_OUTPATIENT_CLINIC_OR_DEPARTMENT_OTHER): Payer: Self-pay

## 2021-03-24 ENCOUNTER — Other Ambulatory Visit (HOSPITAL_COMMUNITY): Payer: Self-pay

## 2021-03-24 MED ORDER — RIVAROXABAN 20 MG PO TABS: 20.0000 mg | ORAL_TABLET | Freq: Every day | ORAL | 5 refills | Status: DC

## 2021-03-24 NOTE — Telephone Encounter (Signed)
Please send Xarelto 20mg  QD to preferred pharmacy. 30 day supply with 5 refills. Local [facility].   Thank you! Loel Dubonnet, NP

## 2021-03-24 NOTE — Telephone Encounter (Signed)
Please advise 

## 2021-03-28 NOTE — Telephone Encounter (Signed)
Can you please call her pharmacy to find out why they did not apply the coupon?   Thanks! Loel Dubonnet, NP

## 2021-04-07 ENCOUNTER — Encounter (HOSPITAL_BASED_OUTPATIENT_CLINIC_OR_DEPARTMENT_OTHER): Payer: Self-pay

## 2021-04-07 NOTE — Telephone Encounter (Signed)
Called patient to speak about the mychart message she sent in regarding a potential blood clot.    Patient states she woke up with severe pain in the back of the knee. Patient denies any swelling, heat or red to the touch.   Patient denies any changes to routine or normal activities.   Patient endorses believing that it is joint or muscular. RN recommended patient be seen by primary care. Patient asked for recommendation for Primary  Care. RN gave recommendation to the primary care office upstairs at [facility], patient says she has been seen by that doctor and did not care for her. RN offered to give the number listed below to look for new primary care and pt. Said she has a list but there are too many people on the list.   Recommend establishing with a primary care provider.  You may call [facility] @ [phone] for a list of primary care providers in your area or visit their website [email or web address] Please have any insurance card available before calling or going online.  Informed Patient that she is not displaying any typical symptoms of a blood clot she did not need to rush to be seen at at the Emergency department, but could be seen at an Urgent Care if she could not been seen by her Primary Care. Patient said she would not be going to the urgent care.   Will forward to Laurann Montana, NP for further review!

## 2021-04-15 ENCOUNTER — Encounter (HOSPITAL_BASED_OUTPATIENT_CLINIC_OR_DEPARTMENT_OTHER): Payer: Self-pay | Admitting: Family

## 2021-04-15 ENCOUNTER — Encounter (HOSPITAL_BASED_OUTPATIENT_CLINIC_OR_DEPARTMENT_OTHER): Payer: Self-pay

## 2021-04-15 ENCOUNTER — Ambulatory Visit (INDEPENDENT_AMBULATORY_CARE_PROVIDER_SITE_OTHER): Payer: Medicare Other | Admitting: Family

## 2021-04-15 VITALS — BP 151/63 | HR 81 | Ht 66.0 in | Wt 205.0 lb

## 2021-04-15 DIAGNOSIS — E039 Hypothyroidism, unspecified: Secondary | ICD-10-CM | POA: Diagnosis not present

## 2021-04-15 DIAGNOSIS — I6523 Occlusion and stenosis of bilateral carotid arteries: Secondary | ICD-10-CM

## 2021-04-15 DIAGNOSIS — D6859 Other primary thrombophilia: Secondary | ICD-10-CM

## 2021-04-15 DIAGNOSIS — N1831 Chronic kidney disease, stage 3a: Secondary | ICD-10-CM

## 2021-04-15 DIAGNOSIS — Z8673 Personal history of transient ischemic attack (TIA), and cerebral infarction without residual deficits: Secondary | ICD-10-CM | POA: Diagnosis not present

## 2021-04-15 DIAGNOSIS — I342 Nonrheumatic mitral (valve) stenosis: Secondary | ICD-10-CM | POA: Diagnosis not present

## 2021-04-15 DIAGNOSIS — Z86711 Personal history of pulmonary embolism: Secondary | ICD-10-CM

## 2021-04-15 DIAGNOSIS — I480 Paroxysmal atrial fibrillation: Secondary | ICD-10-CM

## 2021-04-15 DIAGNOSIS — E785 Hyperlipidemia, unspecified: Secondary | ICD-10-CM | POA: Diagnosis not present

## 2021-04-15 DIAGNOSIS — I350 Nonrheumatic aortic (valve) stenosis: Secondary | ICD-10-CM | POA: Diagnosis not present

## 2021-04-15 NOTE — Telephone Encounter (Signed)
BP#'s

## 2021-04-15 NOTE — Patient Instructions (Addendum)
Hello Cynthia Cook,   Below is the information from our visit. Please let me know if I missed anything! It was nice to speak with you and I hope you have a wonderful weekend.   Best,  Loel Dubonnet, NP   ______________________  Medication Instructions:  Continue your current medications.   *If you need a refill on your cardiac medications before your next appointment, please call your pharmacy*  Lab Work: None ordered today.   Testing/Procedures: None ordered today.   Follow-Up: At [facility], you and your health needs are our priority.  As part of our continuing mission to provide you with exceptional heart care, we have created designated Provider Care Teams.  These Care Teams include your primary Cardiologist (physician) and Advanced Practice Providers (APPs -  Physician Assistants and Nurse Practitioners) who all work together to provide you with the care you need, when you need it.  We recommend signing up for the patient portal called "MyChart".  Sign up information is provided on this After Visit Summary.  MyChart is used to connect with patients for Virtual Visits (Telemedicine).  Patients are able to view lab/test results, encounter notes, upcoming appointments, etc.  Non-urgent messages can be sent to your provider as well.   To learn more about what you can do with MyChart, go to [email or web address].    Your next appointment:   08/11/2021 at 1:30PM with Loel Dubonnet, NP at [facility]  Other Instructions  Lightheadedness/Ear, Nose, Throat Your lightheadedness could be related to an inner ear problem. Dr. Kasandra Knudsen Jonathon Bellows Benjamine Mola is an ENT affiliated with [facility]. If you would like a referral simply let us know. The Epley maneuver can help if lightheadedness is from vertigo but does require another person to help.   PCP offices near you [facility] at [place]   [place], [place], [place]  [address] [facility] at [place]   [place], [place], [place] [address]  Please bring your blood pressure cuff to your next office visit so we can ensure it is reading accurately.   We have mailed Xarelto patient assistance to your home. Please review, fill out, and return to our office by dropping off or mailing. Please review carefully as we do provide a copy of your insurance information so they know you do not qualify for the $10 commercial insurance coupon.   Watchman Device Utilized in some patients with atrial fibrillation to avoid long term anticoagulation. If you would like a referral to our EP (Electrophysiology) team who implants the device let us know. I have reached out to them for estimates on Medicare coverage. Video about the device available here: [email or web address] If the link does not work Interior and spatial designer 'Research scientist (medical) Works' and it should be the first link.  Outpatient Physical Therapy [facility] at [facility]   [phone]   Physical therapy, dry  needling [facility] at [place]   Aquatic therapy, outpatient rehabilitation, physical therapy If you would like a referral to either simply let me know. I am not sure if either has the specific therapy equipment you mentioned but you could call and ask if interested.

## 2021-04-15 NOTE — Progress Notes (Signed)
Virtual Visit via Telephone Note   This visit type was conducted due to national recommendations for restrictions regarding the COVID-19 Pandemic (e.g. social distancing) in an effort to limit this patient's exposure and mitigate transmission in our community.  Due to her co-morbid illnesses, this patient is at least at moderate risk for complications without adequate follow up.  This format is felt to be most appropriate for this patient at this time.  The patient did not have access to video technology/had technical difficulties with video requiring transitioning to audio format only (telephone).  All issues noted in this document were discussed and addressed.  No physical exam could be performed with this format.  Please refer to the patient's chart for her  consent to telehealth for [facility].    Date:  04/15/2021   ID:  Cynthia Cook, DOB 1956-02-28, MRN [id number] The patient was identified using 2 identifiers.  Patient Location: Home Provider Location: Office/Clinic   PCP:  Early, Coralee Pesa, NP   [facility] Providers Cardiologist:  Buford Dresser, MD   Evaluation Performed:  Follow-Up Visit  Chief Complaint:  Follow up of anticoagulation  History of Present Illness:    Cynthia Cook is a 66 y.o. female with hx of paroxysmal atrial fibrillation, CVA, thyroid disease, CKD 3A, aortic stenosis, pulmonary embolism, mitral regurgitation/stenosis, syncope last seen in clinic 03/16/2020 by Dr. Harrell Gave.   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   She has established with Dr. Harrell Gave due to atrial fibrillation.  She underwent cardioversion 02/27/2020 after presenting to the ED with atrial fib with RVR.  She has previously declined anticoagulation.   She was admitted 12/2020 due to acute CVA.  MRI brain with subacute infarct in left centrum semiovale.  Carotid duplex 01/04/21 with bilateral 1-39% stenosis.  Echo  with bubble 01/04/2021 with LVEF 55 to 60%, no R WMA, grade 1 diastolic dysfunction, RV normal size and function, left atrium moderately dilated, moderate MR, mild to moderate mitral stenosis, trivial AI, mild to moderate aortic stenosis, mild dilation of ascending aorta 41 mmHg.  There was no evidence of interatrial shunt, bubble study negative. She was recommended for CIR but declined and returned home with home health.    She had trouble with headache and dizziness after discharge but this resolved with taking her vitamins/supplements separately from her Eliquis.  She was seen 01/21/21 and was continuing to work with PT, OT, and SLP. Lipid lowering therapy was discussed and as she was hesitant regarding statin Zetia was initiated. Since that time via MyChart messages she has been recommended to participate in stroke support group.  She was unable to afford Eliquis and did not qualify for patient assistance nor Social Security systems.  She was transitioned to Xarelto with free 30-day supply.  She presents today for follow up. She requested a visit to discuss anticoagulation. Reports no chest pain, pressure, or tightness. No edema, orthopnea, PND. No melena, hematuria. Reports no palpitations.  Still with "constant lightheadedness" tells me sometimes it is worsened as a persistent dizziness or near syncope. Tells me it feels like two years ago when she had atrial fibrillation and was taking higher dose Metoprolol. She has a small dog who she puts food down and notes that when she changes positions her dizziness is worse. Concerned that her diastolic blood pressure is running low in the 51W-25E while systolic is routinely 527P-824M. Walking 0.5-1 mile per day with her dog. Notes  still with gait abnormality after CVA and walking with cane. Did not note improvement with home healthy physical therapy. Concerns about long term cost and utilization of anticoagulation.   Past Medical History:  Diagnosis Date    Back pain    Gout    Murmur    PE (pulmonary embolism)    Persistent atrial fibrillation (HCC)    Pulmonary emboli (HCC) 02/05/2020   Syncope    Thyroid disease    No past surgical history on file.   Current Meds  Medication Sig   ascorbic acid (VITAMIN C) 500 MG tablet Take 500 mg by mouth daily.   colchicine 0.6 MG tablet Take 2 tabs (1.2mg ) at initial sign of gout attack and 1 tab (0.6mg ) 1 hour later THEN take 1 tab (0.6mg ) twice a day until gout flair resolves.   ezetimibe (ZETIA) 10 MG tablet Take 1 tablet (10 mg total) by mouth daily.   levothyroxine (SYNTHROID) 50 MCG tablet Take 1 tablet (50 mcg) by mouth on Monday, Wednesday, and Friday   levothyroxine (SYNTHROID) 75 MCG tablet Take 1 tablet (75 mcg) by mouth on Tuesday, Thursday, Saturday, and Sunday   rivaroxaban (XARELTO) 20 MG TABS tablet Take 1 tablet (20 mg total) by mouth daily with supper.   triamcinolone cream (KENALOG) 0.1 % Apply 1 application topically daily as needed (rash).    Vitamin D-Vitamin K (VITAMIN K2-VITAMIN D3 PO) Take 1 tablet by mouth daily.      Allergies:   Codeine and Fentanyl   Social History   Tobacco Use   Smoking status: Never   Smokeless tobacco: Never  Vaping Use   Vaping Use: Never used  Substance Use Topics   Alcohol use: No   Drug use: Never    Family Hx: The patient's Family history is unknown by patient.  ROS:   Please see the history of present illness.     All other systems reviewed and are negative.  Prior CV studies:   The following studies were reviewed today:  Carotid duplex 01/04/2021 Summary:  Right Carotid: Velocities in the right ICA are consistent with a 1-39%  stenosis.   Left Carotid: Velocities in the left ICA are consistent with a 1-39%  stenosis.   Vertebrals: Bilateral vertebral arteries demonstrate antegrade flow.  Subclavians: Normal flow hemodynamics were seen in bilateral subclavian               arteries.    Echo 02/06/20 1. Left  ventricular ejection fraction, by estimation, is 55 to 60%. The  left ventricle has normal function. The left ventricle has no regional  wall motion abnormalities. There is mild concentric left ventricular  hypertrophy. Left ventricular diastolic  function could not be evaluated.   2. Moderate calcific mitral stenosis is present. MG 5.6 mmHG @ 109 bpm.  MVA by VTI 1.86 cm2. Mild to moderate MR is present which is related to  restricted movement of the PMVL (IIIB). The mitral valve is degenerative.  Mild to moderate mitral valve  regurgitation. Moderate mitral stenosis. Severe mitral annular  calcification.   3. Mild to moderate AS is present. V max 2.25 m/s, MG 12.3 mmHG, AVA 1.29  cm2, DI 0.34. Gradients lower than expected due to low SVI (SV=56 cc,  SVI=26 cc/m2). The aortic valve is tricuspid. There is moderate  calcification of the aortic valve. There is  moderate thickening of the aortic valve. Aortic valve regurgitation is not  visualized. Mild to moderate aortic valve stenosis. Aortic valve  area, by  VTI measures 1.29 cm. Aortic valve mean gradient measures 12.3 mmHg.  Aortic valve Vmax measures 2.25  m/s.   4. Right ventricular systolic function is normal. The right ventricular  size is mildly enlarged. There is mildly elevated pulmonary artery  systolic pressure. The estimated right ventricular systolic pressure is  50.3 mmHg.   5. Left atrial size was severely dilated.   6. The inferior vena cava is dilated in size with <50% respiratory  variability, suggesting right atrial pressure of 15 mmHg.   Comparison(s): Changes from prior study are noted. Afib with RVR is now  present. Valvular heart disease is stable.    CT PE 02/27/20 FINDINGS: Cardiovascular: There is adequate opacification of the pulmonary arterial tree. There is no intraluminal filling defect identified to suggest acute pulmonary embolism. The central pulmonary arteries are enlarged in keeping with  pulmonary arterial hypertension. There is extensive coronary artery calcification. There is extensive calcification of the mitral valve annulus. Mild calcification of the aortic valve leaflets. There is mild global cardiomegaly. No pericardial effusion. Atherosclerotic calcification is seen within the thoracic aorta. No aortic aneurysm.   Mediastinum/Nodes: No pathologic thoracic adenopathy. Esophagus is unremarkable.   Lungs/Pleura: Trace right and small left pleural effusions are present. Mild bibasilar atelectasis. No superimposed confluent pulmonary infiltrate. No pneumothorax. Central airways are widely patent.   Upper Abdomen: Unremarkable   Musculoskeletal: No acute bone abnormality.   Review of the MIP images confirms the above findings.   IMPRESSION: No pulmonary embolism.   Morphologic changes of pulmonary arterial hypertension.   Mild global cardiomegaly.  Extensive coronary artery calcification.   Extensive calcification of the mitral valve annulus noted. Echocardiography may be helpful to assess the degree of valvular dysfunction.   Trace right and small left pleural effusions, slightly increased since prior examination.    Labs/Other Tests and Data Reviewed:    EKG:  No ECG reviewed.  Recent Labs: 01/21/2021: BUN 26; Creatinine, Ser 1.31; Hemoglobin 13.5; Platelets 196; Potassium 4.4; Sodium 145 02/17/2021: TSH 4.670   Recent Lipid Panel Lab Results  Component Value Date/Time   CHOL 200 01/04/2021 02:25 AM   TRIG 76 01/04/2021 02:25 AM   HDL 49 01/04/2021 02:25 AM   CHOLHDL 4.1 01/04/2021 02:25 AM   LDLCALC 136 (H) 01/04/2021 02:25 AM   Wt Readings from Last 3 Encounters:  04/15/21 205 lb (93 kg)  02/24/21 216 lb (98 kg)  02/17/21 211 lb (95.7 kg)   Risk Assessment/Calculations:    CHA2DS2-VASc Score = 6   This indicates a 9.7% annual risk of stroke. The patient's score is based upon: CHF History: 0 HTN History: 1 Diabetes History:  0 Stroke History: 2 Vascular Disease History: 1 Age Score: 1 Gender Score: 1   Objective:    Vital Signs:  BP (!) 151/63    Pulse 81    Ht 5\' 6"  (1.676 m)    Wt 205 lb (93 kg)    SpO2 96%    BMI 33.09 kg/m    VITAL SIGNS:  reviewed  ASSESSMENT & PLAN:    Valvular heart disease -Echocardiogram 01/04/2021 with mild to moderate aortic valve stenosis and mild to moderate mitral stenosis.  Plan for repeat echocardiogram in 1 year.  Low suspicion her current lightheadedness is related to valve dysfunction but if persistent at follow up could consider sooner repeat echocardiogram. Continue optimal blood pressure and volume control.   Coronary artery calcification on CT -CT 02/2020 with extensive coronary calcifications.  Stabe  with no anginal symptoms.  GDMT includes Zetia.  No aspirin due to chronic anticoagulation.  No beta-blocker due to previous intolerance. Heart healthy diet and regular cardiovascular exercise encouraged.  Encouraged to continue walking with her dog.   History of CVA -continue optimal BP and lipid control.  Still with gait instability after home health PT.  Wonders whether this is permanent from CVA or could be improved by future therapy.  We discussed referral to outpatient physical therapy at [facility] or [facility] in [place] which she will consider. Ambulating with cane.   Paroxysmal atrial fibrillation /chronic anticoagulation - Reports no palpitations.  Previous intolerance to metoprolol.   She will continue Xarelto 20mg  QD. CHA2DS2-VASc Score = 6 [CHF History: 0, HTN History: 1, Diabetes History: 0, Stroke History: 2, Vascular Disease History: 1, Age Score: 1, Gender Score: 1].  Therefore, the patient's annual risk of stroke is 9.7 %.   Will initiate Xarelto patient assistance by mailing to patient. Reports no bleeding complications.  She is interested in not being on anticoagulation long term. Provided her information on [facility] and she will consider  referral to EP to discuss.  Bilateral carotid artery disease -05/06/2020 carotid duplex with bilateral 1 to 39% stenosis. Continue cholesterol management as detailed below.  Continue optimal blood pressure control.  Consider repeat study as clinically indicated.  Denies amaurosis fugax.   Obesity - Weight loss via diet and exercise encouraged. Discussed the impact being overweight would have on cardiovascular risk. Encouraged to continue walking with her dog.   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 Triggered event. Continue Xarelto given atrial fibrillation.    Hypothyroidism - Continue to follow with PCP.    CKD 3 A - Careful titration of diuretic and antihypertensive.     HLD, LDL goal less than 70 -01/04/2021 LDL 136. Continue Zetia. Consider repeat lipid at follow up.   Lightheadedness/Dizziness - No hypotension by home readings. No tachycardia. Echocardiogram as detailed above. Consider inner ear etiology. Will consider referral to ENT. Provided education on Epley maneuver though not able to complete as she lives alone.   Time:   Today, I have spent 35 minutes with the patient with telehealth technology discussing the above problems.     Medication Adjustments/Labs and Tests Ordered: Current medicines are reviewed at length with the patient today.  Concerns regarding medicines are outlined above.   Tests Ordered: No orders of the defined types were placed in this encounter.   Medication Changes: No orders of the defined types were placed in this encounter.   Follow Up:  In Person or virtual in May as scheduled with Loel Dubonnet, NP   Signed, Loel Dubonnet, NP  04/15/2021 5:19 PM    [place] [facility]

## 2021-04-17 NOTE — Telephone Encounter (Signed)
Discussed during telephone visit. See separate encounter.   Loel Dubonnet, NP

## 2021-04-18 ENCOUNTER — Encounter (HOSPITAL_BASED_OUTPATIENT_CLINIC_OR_DEPARTMENT_OTHER): Payer: Self-pay | Admitting: Family

## 2021-04-18 ENCOUNTER — Encounter (HOSPITAL_BASED_OUTPATIENT_CLINIC_OR_DEPARTMENT_OTHER): Payer: Self-pay

## 2021-04-18 DIAGNOSIS — R29898 Other symptoms and signs involving the musculoskeletal system: Secondary | ICD-10-CM

## 2021-04-18 DIAGNOSIS — Z8673 Personal history of transient ischemic attack (TIA), and cerebral infarction without residual deficits: Secondary | ICD-10-CM

## 2021-04-18 DIAGNOSIS — I83813 Varicose veins of bilateral lower extremities with pain: Secondary | ICD-10-CM

## 2021-04-18 DIAGNOSIS — R2681 Unsteadiness on feet: Secondary | ICD-10-CM

## 2021-04-18 NOTE — Addendum Note (Signed)
Addended by: Loel Dubonnet on: 04/18/2021 04:01 PM   Modules accepted: Orders

## 2021-04-18 NOTE — Telephone Encounter (Signed)
I will put in referals

## 2021-04-18 NOTE — Telephone Encounter (Signed)
Please advise 

## 2021-04-19 ENCOUNTER — Telehealth: Payer: Self-pay

## 2021-04-19 NOTE — Telephone Encounter (Signed)
Incoming call from [place] with [facility].  Ebony Hail stated that patient was referred for rehab.  She is requesting any records that could help the therapist with the patients rehab.  She stated any records can be faxed to [phone]

## 2021-04-19 NOTE — Telephone Encounter (Signed)
Unclear how [facility] received referral. I referred her for outpatient PT at [facility] as this is close to her home. Confirmed with their office that referral received. They will be reaching out to her today to schedule.  Spoke with Miss Rys - she prefers to keep her therapy in the [facility] system. As such, she will cancel her appointment with [facility]. She was appreciative of the call.   Loel Dubonnet, NP

## 2021-04-21 ENCOUNTER — Encounter (HOSPITAL_BASED_OUTPATIENT_CLINIC_OR_DEPARTMENT_OTHER): Payer: Self-pay

## 2021-04-21 ENCOUNTER — Ambulatory Visit: Payer: Medicare Other | Admitting: Physical Therapy

## 2021-04-21 NOTE — Telephone Encounter (Signed)
Please advise 

## 2021-04-28 ENCOUNTER — Encounter (HOSPITAL_BASED_OUTPATIENT_CLINIC_OR_DEPARTMENT_OTHER): Payer: Self-pay

## 2021-04-29 NOTE — Telephone Encounter (Signed)
Please advise 

## 2021-05-02 ENCOUNTER — Encounter (HOSPITAL_BASED_OUTPATIENT_CLINIC_OR_DEPARTMENT_OTHER): Payer: Self-pay

## 2021-05-03 NOTE — Telephone Encounter (Signed)
Sorry

## 2021-05-16 ENCOUNTER — Encounter (HOSPITAL_BASED_OUTPATIENT_CLINIC_OR_DEPARTMENT_OTHER): Payer: Self-pay

## 2021-05-16 ENCOUNTER — Telehealth: Payer: Self-pay | Admitting: Family

## 2021-05-16 NOTE — Telephone Encounter (Addendum)
° °  Patient Name: Cynthia Cook  DOB: 21-Mar-1956 MRN: [id number]  Primary Cardiologist: Buford Dresser, MD  Chart reviewed as part of pre-operative protocol coverage. Simple dental extractions are considered low risk procedures per guidelines and generally do not require any specific cardiac clearance. It is also generally accepted that for simple extractions and dental cleanings, there is no need to interrupt blood thinner therapy.  SBE prophylaxis is not required for the patient from a cardiac standpoint.  Office does not have a fax number but I personally spoke with dental office staff and gave above recommendations.   Darreld Mclean, PA-C 05/16/2021, 11:54 AM

## 2021-05-16 NOTE — Telephone Encounter (Signed)
° °  Pre-operative Risk Assessment    Patient Name: Cynthia Cook  DOB: 01/24/1956 MRN: [id number]     Request for Surgical Clearance    Procedure:  Dental Extraction - Amount of Teeth to be Pulled:  1  Date of Surgery:  Clearance 05/16/21                                 Surgeon:  Dr. Uvaldo Bristle  Surgeon's Group or Practice Name:  [facility] Phone number:  [phone] Fax number:  office does not have a fax   Type of Clearance Requested:   - Pharmacy:  Hold Rivaroxaban (Xarelto) TBD by Cardiology   Type of Anesthesia:  Local    Additional requests/questions:    Rosalyn Gess   05/16/2021, 11:38 AM

## 2021-05-16 NOTE — Telephone Encounter (Signed)
Took the call from the dentist office.  They have pt in the chair, needing to pull 1 tooth, needing ok since pt's on Xarelto  Transferred call to Sande Rives, PA-C (preop provider today)

## 2021-05-16 NOTE — Telephone Encounter (Signed)
Dental Emergency.

## 2021-05-22 ENCOUNTER — Encounter: Payer: Self-pay | Admitting: Adult Health

## 2021-05-24 ENCOUNTER — Other Ambulatory Visit: Payer: Self-pay

## 2021-05-24 DIAGNOSIS — I872 Venous insufficiency (chronic) (peripheral): Secondary | ICD-10-CM

## 2021-05-30 ENCOUNTER — Encounter (HOSPITAL_BASED_OUTPATIENT_CLINIC_OR_DEPARTMENT_OTHER): Payer: Self-pay

## 2021-05-31 NOTE — Telephone Encounter (Signed)
Follow up on today's paperwork

## 2021-06-01 ENCOUNTER — Encounter (HOSPITAL_COMMUNITY): Payer: Medicare Other

## 2021-06-09 ENCOUNTER — Ambulatory Visit (HOSPITAL_BASED_OUTPATIENT_CLINIC_OR_DEPARTMENT_OTHER): Payer: Medicare Other | Admitting: Family

## 2021-06-09 ENCOUNTER — Encounter (HOSPITAL_BASED_OUTPATIENT_CLINIC_OR_DEPARTMENT_OTHER): Payer: Self-pay

## 2021-06-09 DIAGNOSIS — Z8673 Personal history of transient ischemic attack (TIA), and cerebral infarction without residual deficits: Secondary | ICD-10-CM

## 2021-06-09 NOTE — Telephone Encounter (Signed)
Here ya go!  

## 2021-06-13 ENCOUNTER — Telehealth: Payer: Self-pay | Admitting: Licensed Clinical Social Worker

## 2021-06-13 NOTE — Telephone Encounter (Signed)
Received inbasket today from [place], NP.  ?Pt having difficulty affording her current medications.  ?Our recommendations for patients having affordability challenges with medications are as follows:  ?- to apply for manufacturer's patient assistance programs ? ?- to apply for Extra Help program either online at DictionaryDirectory.es.action or by calling [phone] ?Monday-Friday 7am-7pm ? ?- to reach out to the free [employer] counselors at [employer]. Mikki Santee and his team can be reached at [phone] ext [phone] regarding any additional assistance options ? ?I have mailed pt information about the [facility] program and Extra Help. ? ?Westley Hummer, MSW, LCSW ?Clinical Social Worker II ?[place] [facility] Navigation  ?[phone]- work cell phone (preferred) ?[phone]- desk phone ? ?

## 2021-06-13 NOTE — Telephone Encounter (Signed)
Just an FYI

## 2021-06-14 ENCOUNTER — Telehealth: Payer: Self-pay | Admitting: Licensed Clinical Social Worker

## 2021-06-14 NOTE — Progress Notes (Signed)
This note is not being shared with the patient for the following reason: ?To prevent harm (release of this note would result in harm to the life or physical safety of the patient or another).  ?Heart and Vascular Care Navigation ? ?06/14/2021 ? ?Cynthia Cook ?Aug 30, 1955 ?[id number] ? ?Reason for Referral:  ?Life Stressors/Financial Stressors ?Engaged with patient by telephone for initial visit for Heart and Vascular Care Coordination. ?                                                                                                  ?Assessment:                                     ?LCSW reached out to pt via telephone today in response to MyChart message in which pt expressed distress about ongoing illness with her dog and feelings of hopelessness. LCSW was able to contact her at [phone]. Introduced self, role, reason for call. Only other interactions w/ patient were related to transportation and the resources mailed yesterday to pt. I shared that Urban Gibson and Daphene Jaeger had sent her message to me to see how we might be of assistance and support. Pt tearful during our conversation. She shares that there are multiple ongoing stressors that have been weighing on her.  ? ?Pt initially shares ongoing financial stress related to costs of living and care for her and her dog. She laments that the system often seems imbalanced, and that her income that she receives does place her out of reach of most social service assistance. She feels as though her concerns regarding inability to access additional support are not unreasonable. I provided verbal support that it is understandably frustrating when we compare ourselves to what others seem to be receiving, and that we also as providers often find ourselves w/ few answers for our patients despite our best efforts.  ? 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 This loss of income also made her feel as if she had lost some purpose since she really valued her career.  ? ?She has several stressors related to past relationships and family of origin that also weigh heavily on her. Her dog [name] is her main support. He has a terminal diagnosis per her report and she isnt sure how much longer that he will be with her. This anticipatory stress/grief around his passing is very upsetting to her and she states she doesn't know how she would go on.  ? ?LCSW checked for safety, pt clarifies that she does not have any current plans to hurt herself or others but that her stressors throughout her life have led to passive SI intermittently throughout her life. We discussed that [facility] is part of the larger [facility] network and that we have a connection with Elias Else, PsyD, who is also located at [employer]. Pt  sees Urban Gibson there and I provided context that he works with many of our cardiology patients regarding the stress that comes with chronic care needs along with everyday life stress. Pt shares that she is agreeable to a referral, she has thought about counseling recently and called [facility] which left her confused/disinterested based on the interaction that she had with the representative (it was insinuated that she would not be covered). I shared that I could not confirm whether or not it would be covered but that the team with Elias Else would be able to provide perhaps some more clarity once referral was made.  ? ?This Probation officer stressed the importance of pt continuing to take care of herself and coming to see our team for care. I shared that we wanted to to use the same loving kindness and compassion that she gives to [place], to herself also. Pt appreciative of phone call, I offered to check in next week and let Urban Gibson know of our call. Pt agreeable to both.  ? ?HRT/VAS Care Coordination   ? ? Patients Home Cardiology Office --   [place]  ? Outpatient Care Team Social Worker  ? Social Worker Name: Margarito Liner [facility] [phone]  ? Living arrangements for the past 2 months Apartment  ? Lives with: Self; Pets  ? Patient Current Insurance Coverage Managed Medicare  ? Patient Has Concern With Paying Medical Bills Yes  ? Patient Concerns With Medical Bills ongoing medical needs, limited income.  ? Medical Bill Referrals: does not meet income requirements for extra help, referred to [facility]  ? Does Patient Have Prescription Coverage? Yes  ? Home Assistive Devices/Equipment Cane (specify quad or straight)  ? DME Agency [employer]  ? [place] [facility]  ? ?  ? ? ?Social History:                                                                             ?SDOH Screenings  ? ?Alcohol Screen: Not on file  ?Depression (PHQ2-9): Medium Risk  ? PHQ-2 Score: 5  ?Financial Resource Strain: High Risk  ? Difficulty of Paying Living Expenses: Hard  ?Food Insecurity: Not on file  ?Housing: Not on file  ?Physical Activity: Not on file  ?Social Connections: Not on file  ?Stress: Stress Concern Present  ? Feeling of Stress : Very much  ?Tobacco Use: Low Risk   ? Smoking Tobacco Use: Never  ? Smokeless Tobacco Use: Never  ? Passive Exposure: Not on file  ?Transportation Needs: No Transportation Needs  ? Lack of Transportation (Medical): No  ? Lack of Transportation (Non-Medical): No  ? ? ?SDOH Interventions: ?Financial Resources:  Financial Strain Interventions: Other (Comment) (referral to [facility]; unfortunately pt income limits additional assistance programs) ?DSS for financial assistance  ?Transportation:   Transportation Interventions: Other (Comment) (pt has vehicle and limited rides through [facility])  ? ? ?Other Care Navigation Interventions:    ? ?Provided Pharmacy assistance resources  Pt income is above Medicaid and Extra Help, not eligible for NCMedAssist. Offered wafarin as alternative   ?Patient expressed Mental Health concerns Yes, Referred to:  Elias Else, PsyD  ? ?Follow-up plan:   ?LCSW will f/u  with pt next week, I have sent Urban Gibson a message regarding pt agreement w/ referral to Elias Else, PsyD for more ongoing supportive counseling. I also have sent inquiry to [facility] regarding home INR machine if that may be an option that pt would be able to utilize as a more affordable option/would help prevent transportation challenges. Pt encouraged to continue to f/u with [place] for her care, I remain available as needed.  ? ? ? ?

## 2021-06-14 NOTE — Telephone Encounter (Signed)
Hey, I am not sure how to respond to this. It definitely sends off alarm bells in my head, but I don't know what I am supposed to do  ?

## 2021-06-14 NOTE — Addendum Note (Signed)
Addended by: Loel Dubonnet on: 06/14/2021 04:53 PM ? ? Modules accepted: Orders ? ?

## 2021-06-14 NOTE — Telephone Encounter (Signed)
LCSW called and spoke with pt after receiving pt most recent MyChart message to [facility] staff. I was able to reach pt at [phone]. Please see full telephone note from today. Pt denies any plans to harm self or others. Having understandably a hard time with her pet being ill and ongoing financial/life stressors compounding. Offered a referral to Elias Else, PsyD. Pt agreeable. I also offered to check in with pt next week and she is agreeable also to that. Pt appreciative of support from [facility] team, I encouraged her to continue her care with [place] as ensuring her health and wellbeing is our priority.   ? ?Westley Hummer, MSW, LCSW ?Clinical Social Worker II ?[place] [facility]  ?[phone]- work cell phone (preferred) ?[phone]- desk phone ? ? ? ?

## 2021-06-14 NOTE — Telephone Encounter (Signed)
Safety concerns addressed by LCSW. See separate note.  ? ?Loel Dubonnet, NP  ?

## 2021-06-16 NOTE — Telephone Encounter (Signed)
FYI

## 2021-06-21 ENCOUNTER — Telehealth: Payer: Self-pay | Admitting: Licensed Clinical Social Worker

## 2021-06-21 NOTE — Telephone Encounter (Signed)
LCSW reached out to pt to f/u with her today as discussed last week. I was able to reach her at [phone]. Re-introduced self, role, reason for call. Pt shares that she was finally able to work out her insurance challenges and has an appointment scheduled for initial session with Elias Else, PsyD. She is glad to have had that work out. Shares [name] is doing well this week. Inquires if I have heard of [employer] Shared what I know and additional resources as pt rent will increase past what she can afford in June and she is concerned about becoming homeless. Pt requested I send information to her email, I confirmed and have sent resources discussed to [email or web address]. I remain available and pt has my information. She is verbally appreciative of f/u by office, in better spirits today.  ? ?The following email was sent to pt: ?Good Afternoon, ?Here are the resources I mentioned during our call. I hope you bundle up and enjoy some sun with Cynthia Cook today.  ? ?[address]-[place] [address] is an information and referral service provided by [employer] of [place]. Families and individuals can dial 2-1-1 or [phone] to obtain free and confidential information on health and human services and resources within their community. You can also find the link here: [place] 211 ? ?[place] here: HOME  THCD ([email or web address]) ? ?[facility] meet with clients on business days (Monday-Fridays, except scheduled holidays) from 8:30 am to 4:30 pm. Individuals can call our switchboard at [phone] to confirm that we are open. ?Link here: [employer]  a referral, informational, and Chief Executive Officer, assisting clients with a variety of housing problems ? ?[place] Housing Search- you can enter your preferred location and budget on the link here: [email or web address].[email or web address]   ? ?Hopefully one of these may be helpful for you.  ?Michiel Cowboy, social work  ? ? ?Cynthia Cook, MSW, LCSW ?[email or web address] ?Work Cell: [phone] ?Clinical Social Worker II ?[name] ? ? ? ?Cynthia Cook, MSW, LCSW ?Clinical Social Worker II ?[place] [facility]  ?[phone]- work cell phone (preferred) ?[phone]- desk phone ? ? ? ?

## 2021-06-23 ENCOUNTER — Ambulatory Visit: Payer: Medicare Other | Admitting: Adult Health

## 2021-06-23 ENCOUNTER — Ambulatory Visit (INDEPENDENT_AMBULATORY_CARE_PROVIDER_SITE_OTHER): Payer: Medicare Other | Admitting: Psychologist

## 2021-06-23 ENCOUNTER — Telehealth: Payer: Self-pay | Admitting: Licensed Clinical Social Worker

## 2021-06-23 DIAGNOSIS — F332 Major depressive disorder, recurrent severe without psychotic features: Secondary | ICD-10-CM

## 2021-06-23 NOTE — Telephone Encounter (Signed)
Received message from Elias Else, [place]. Initial assessment completed w/ pt. He noted that pt had shared concerns around housing in June and that she was requesting resources. I shared that I had sent pt an email with the resources we have available. The resources shared were as follows:  ?[address]-[place] 211 is an information and referral service provided by [place] of [place]. Families and individuals can dial 2-1-1 or [phone] to obtain free and confidential information on health and human services and resources within their community. You can also find the link here: [place] 211 ?  ?[place] here: HOME  THCD ([email or web address]) ?  ?[place] [facility] meet with clients on business days (Monday-Fridays, except scheduled holidays) from 8:30 am to 4:30 pm. Individuals can call our switchboard at [phone] to confirm that we are open. ?Link here: [employer]  a referral, informational, and Chief Executive Officer, assisting clients with a variety of housing problems ?  ?[place] Housing Search- you can enter your preferred location and budget on the link here: [email or web address] [employer]  ? ?I will mail these as well. Pt had emailed me back and confirmed she received them on 3/14. ?I remain available. Appreciate team support for pt.  ? ?Westley Hummer, MSW, LCSW ?Clinical Social Worker II ?[place] [facility]  ?[phone]- work cell phone (preferred) ?[phone]- desk phone ? ?

## 2021-06-23 NOTE — Telephone Encounter (Signed)
Mailed resources as well as re-emailed them to pt at [email or web address] as she shared w/ Elias Else, PsyD that she had not received them. ? ?Westley Hummer, MSW, LCSW ?Clinical Social Worker II ?[place] [facility]  ?[phone]- work cell phone (preferred) ?[phone]- desk phone ? ?

## 2021-06-23 NOTE — Progress Notes (Signed)
                Banesa Tristan, PsyD 

## 2021-06-23 NOTE — Plan of Care (Signed)
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
## 2021-06-28 ENCOUNTER — Telehealth (INDEPENDENT_AMBULATORY_CARE_PROVIDER_SITE_OTHER): Payer: Medicare Other | Admitting: Nurse Practitioner

## 2021-06-28 ENCOUNTER — Encounter (HOSPITAL_BASED_OUTPATIENT_CLINIC_OR_DEPARTMENT_OTHER): Payer: Self-pay | Admitting: Nurse Practitioner

## 2021-06-28 ENCOUNTER — Other Ambulatory Visit: Payer: Self-pay

## 2021-06-28 VITALS — BP 138/76 | HR 80 | Wt 196.0 lb

## 2021-06-28 DIAGNOSIS — L304 Erythema intertrigo: Secondary | ICD-10-CM | POA: Diagnosis not present

## 2021-06-28 DIAGNOSIS — Z5986 Financial insecurity: Secondary | ICD-10-CM | POA: Diagnosis not present

## 2021-06-28 DIAGNOSIS — I83812 Varicose veins of left lower extremities with pain: Secondary | ICD-10-CM | POA: Diagnosis not present

## 2021-06-28 DIAGNOSIS — E063 Autoimmune thyroiditis: Secondary | ICD-10-CM | POA: Diagnosis not present

## 2021-06-28 DIAGNOSIS — E038 Other specified hypothyroidism: Secondary | ICD-10-CM | POA: Diagnosis not present

## 2021-06-28 DIAGNOSIS — I8393 Asymptomatic varicose veins of bilateral lower extremities: Secondary | ICD-10-CM | POA: Insufficient documentation

## 2021-06-28 MED ORDER — TRIAMCINOLONE ACETONIDE 0.1 % EX CREA: 1.0000 "application " | TOPICAL_CREAM | Freq: Every day | CUTANEOUS | 1 refills | Status: DC | PRN

## 2021-06-28 MED ORDER — LEVOTHYROXINE SODIUM 50 MCG PO TABS: ORAL_TABLET | ORAL | 11 refills | Status: AC

## 2021-06-28 MED ORDER — LEVOTHYROXINE SODIUM 75 MCG PO TABS: ORAL_TABLET | ORAL | 11 refills | Status: AC

## 2021-06-28 NOTE — Assessment & Plan Note (Signed)
Increasing housing, food, and medication costs creating a financial burden for patient.  ?Concerns are present that she may not be able to obtain her medications, food, and shelter as appropriate.  ?She is working with social work and her providers are attempting to help locate resources that may be helpful.  ?Recommend considering local food bank and church's to help decrease burden of food costs and provide appropriate nutrition to keep her health optimal. Offer for information on local church's food banks, however, she declines information at this time. Explained that these services are not limited to church members and are open to the public to help with need. I will be happy to send resources in the future if she wishes to pursue this option.  ?She may consider social security services to help with her current situation.  ?I am concerned about the impact that her financial situation may have on her overall health and wellbeing. I will monitor for resources that may be helpful and try to assist as able.  ?Recommend she continue to stay in contact with her providers for support and continue to reach out to resources provided to help with her current situation.  ?

## 2021-06-28 NOTE — Patient Instructions (Addendum)
Congratulations on your weight loss journey!! You are doing so well with this.  ? ? ?

## 2021-06-28 NOTE — Progress Notes (Signed)
Virtual Visit Encounter mychart visit. ? ? ?I connected with  Cynthia Cook on 06/28/21 at  3:50 PM EDT by secure video enabled telemedicine application. I verified that I am speaking with the correct person using two identifiers. ?  ?I introduced myself as a Designer, jewellery with the practice. The limitations of evaluation and management by telemedicine discussed with the patient and the availability of in person appointments. The patient expressed verbal understanding and consent to proceed. ? ?Participating parties in this visit include: Myself and patient ? ?The patient is: Patient Location: Home ?I am: Provider Location: Office/Clinic ?Subjective:   ? ?CC and HPI: Cynthia Cook is a 66 y.o. year old female presenting for follow up of medication management. ?Shalaine reports: ?- she has lost weight with efforts of walking with her dog and dietary changes.  ?- she reports dietary changes are due to limited income and need to cut back on expenses which includes food  ?- she is concerned with limited access to food and her current housing situation- she is working with social work and counseling services to help with this.  ?- concerned with cost of xarelto and inability to afford this medication, she is working with cardiology to find an alternative, efforts to contact medicare have not been helpful. ?- unable to receive financial assistance due to her income level ?- since weight loss her varicose veins are more painful than they have been in the past.  ?- not much LE edema present, but veins feel hard like ropes.  ?- lump on shin still present, not sure what this is ?- she would like referral to vascular surgery for evaluation ?- she expresses frustration with her attempts at managing her medication and appointments through medicare.   ? ?Past medical history, Surgical history, Family history not pertinant except as noted below, Social history, Allergies, and medications have been entered into the medical  record, reviewed, and corrections made.  ? ?Review of Systems:  ?All review of systems negative except what is listed in the HPI ? ?Objective:   ? ?Alert and oriented x 4 ?Speaking in clear sentences with no shortness of breath. ?No distress. ? ?Impression and Recommendations:   ? ?Problem List Items Addressed This Visit   ? ? RESOLVED: Hypothyroidism - Primary  ? Relevant Medications  ? levothyroxine (SYNTHROID) 50 MCG tablet  ? levothyroxine (SYNTHROID) 75 MCG tablet  ? Eczema intertrigo  ?  Chronic. Controlled with steroid creams as needed. This does resolve with steroid cream use.  ?Will send refill today.  ?Monitor for signs of infection or new symptoms and notify immediately.  ?  ?  ? Relevant Medications  ? triamcinolone cream (KENALOG) 0.1 %  ? Varicose veins of both lower extremities  ?  Painful varicose veins on LE's with mild edema present.  ?Worsening since recent weight loss.  ?Nodularity on shin of unknown etiology may be related, although this is unclear at this time.  ?Recommend evaluation with vein and vascular to see if intervention can be made to help with her symptoms and improve QOL.  ?She is concerned about the cost of services, recommend that she discuss this when scheduling the appointment to determine her estimated costs so that she can plan accordingly.  ?  ?  ? Financial insecurity  ?  Increasing housing, food, and medication costs creating a financial burden for patient.  ?Concerns are present that she may not be able to obtain her medications, food, and shelter as appropriate.  ?She  is working with social work and her providers are attempting to help locate resources that may be helpful.  ?Recommend considering local food bank and church's to help decrease burden of food costs and provide appropriate nutrition to keep her health optimal. Offer for information on local church's food banks, however, she declines information at this time. Explained that these services are not limited to  church members and are open to the public to help with need. I will be happy to send resources in the future if she wishes to pursue this option.  ?She may consider social security services to help with her current situation.  ?I am concerned about the impact that her financial situation may have on her overall health and wellbeing. I will monitor for resources that may be helpful and try to assist as able.  ?Recommend she continue to stay in contact with her providers for support and continue to reach out to resources provided to help with her current situation.  ?  ?  ? ? ?orders and follow up as documented in EMR ?I discussed the assessment and treatment plan with the patient. The patient was provided an opportunity to ask questions and all were answered. The patient agreed with the plan and demonstrated an understanding of the instructions. ?  ?The patient was advised to call back or seek an in-person evaluation if the symptoms worsen or if the condition fails to improve as anticipated. ? ?Follow-Up: in 6 months ? ?I provided 20 minutes of non-face-to-face interaction with this non face-to-face encounter including intake, same-day documentation, and chart review.  ? ?Orma Render, NP , DNP, AGNP-c ?[facility] ?Primary Care & Sports Medicine at [facility] ?[phone] ?[phone] (fax) ? ?

## 2021-06-28 NOTE — Assessment & Plan Note (Signed)
Chronic. Controlled with steroid creams as needed. This does resolve with steroid cream use.  ?Will send refill today.  ?Monitor for signs of infection or new symptoms and notify immediately.  ?

## 2021-06-28 NOTE — Assessment & Plan Note (Addendum)
Painful varicose veins on LE's with mild edema present.  ?Worsening since recent weight loss.  ?Nodularity on shin of unknown etiology may be related, although this is unclear at this time.  ?Recommend evaluation with vein and vascular to see if intervention can be made to help with her symptoms and improve QOL.  ?She is concerned about the cost of services, recommend that she discuss this when scheduling the appointment to determine her estimated costs so that she can plan accordingly.  ?

## 2021-06-29 ENCOUNTER — Encounter (HOSPITAL_BASED_OUTPATIENT_CLINIC_OR_DEPARTMENT_OTHER): Payer: Self-pay

## 2021-06-30 NOTE — Telephone Encounter (Signed)
How would you like me to proceed?  ?

## 2021-07-08 DIAGNOSIS — D3132 Benign neoplasm of left choroid: Secondary | ICD-10-CM | POA: Diagnosis not present

## 2021-07-08 DIAGNOSIS — H40013 Open angle with borderline findings, low risk, bilateral: Secondary | ICD-10-CM | POA: Diagnosis not present

## 2021-07-08 DIAGNOSIS — H353131 Nonexudative age-related macular degeneration, bilateral, early dry stage: Secondary | ICD-10-CM | POA: Diagnosis not present

## 2021-07-08 DIAGNOSIS — H524 Presbyopia: Secondary | ICD-10-CM | POA: Diagnosis not present

## 2021-07-08 DIAGNOSIS — H25813 Combined forms of age-related cataract, bilateral: Secondary | ICD-10-CM | POA: Diagnosis not present

## 2021-07-11 ENCOUNTER — Ambulatory Visit (INDEPENDENT_AMBULATORY_CARE_PROVIDER_SITE_OTHER): Payer: Medicare Other | Admitting: Psychologist

## 2021-07-11 DIAGNOSIS — F332 Major depressive disorder, recurrent severe without psychotic features: Secondary | ICD-10-CM

## 2021-07-11 NOTE — Progress Notes (Signed)
                Zaul Hubers, PsyD 

## 2021-07-11 NOTE — Progress Notes (Signed)
[facility] Counselor/Therapist Progress Note ? ?Patient ID: Cynthia Cook, MRN: [id number],   ? ?Date: 07/11/2021 ? ?Time Spent: 12:00 pm to 12:26 pm; total time: 26 minutes ? ? This session was held via phone teletherapy due to the coronavirus risk at this time. The patient consented to phone teletherapy and was located at her home during this session. She is aware it is the responsibility of the patient to secure confidentiality on her end of the session. The provider was in a private home office for the duration of this session. Limits of confidentiality were discussed with the patient.  ? ?Treatment Type: Individual Therapy ? ?Reported Symptoms: Depression ? ?Mental Status Exam: ?Appearance:  NA     ?Behavior: Appropriate  ?Motor: NA  ?Speech/Language:  Clear and Coherent  ?Affect: Appropriate  ?Mood: sad  ?Thought process: normal  ?Thought content:   WNL  ?Sensory/Perceptual disturbances:   WNL  ?Orientation: oriented to person, place, and time/date  ?Attention: Good  ?Concentration: Good  ?Memory: WNL  ?Fund of knowledge:  Good  ?Insight:   Poor  ?Judgment:  Poor  ?Impulse Control: Poor  ? ?Risk Assessment: ?Danger to Self:  No ?Self-injurious Behavior: No ?Danger to Others: No ?Duty to Warn:no ?Physical Aggression / Violence:No  ?Access to Firearms a concern: No  ?Gang Involvement:No  ? ?Subjective: The patient described herself as doing okay. Continuing to talk, she did voice some distress about her dog who is experiencing some heart problems. Per the patient, she is going to spend a couple of thousands of dollars to address the issue. From there, she voiced that she does not have any housing options and that she is going to live in her car. She ended the session by talking about how in [place] there is a place where she can get oxygen treatment for her health and her dog's health. Per the patient, she stated that she believes that she has the money to  make this trip viable. She asked to  follow up. She denied suicidal and homicidal ideation.  ? ?Interventions:  Worked on providing a therapeutic relationship using empathy and validation. Provided emotional support using empathy and validation. Reviewed the treatment plan with the patient. Reviewed events since the intake. Normalized and validated expressed thoughts and emotions. Used socratic questions to assist the patient gain insight into self. Assisted in problem solving. Processed thoughts and emotions. Attempted to assist the patient gain insight into financial decisions. Pointed out some of the discrepancies that patient was expressing. Processed how the patient could afford to get to [place] and not afford other financial responsibilities. Validated some of the concerns expressed. Explored ways that the patient could implement self-care. Assessed for suicidal and homicidal ideation.  ? ?Homework: NA ? ?Next Session: Emotional support ? ?Diagnosis: F33.2 major depressive affective disorder, recurrent, severe ? ?Plan:  ? ?Goals ?Work through the grieving process and face reality of own death ?Accept emotional support from others around them ?Live life to the fullest, event though time may be limited ?Become as knowledgeable about the medical condition  ?Reduce fear, anxiety about the health condition  ?Accept the illness ?Accept the role of psychological and behavioral factors  ?Stabilize anxiety level wile increasing ability to function ?Learn and implement coping skills that result in a reduction of anxiety  ?Alleviate depressive symptoms ?Recognize, accept, and cope with depressive feelings ?Develop healthy thinking patterns ?Develop healthy interpersonal relationships ? ?Objectives target date for all objectives is 06/24/2022.  ?Identify feelings associated with the  illness ?Family members share with each other feelings ?Identify the losses or limitations that have been experienced ?Verbalize acceptance of the reality of the medical  condition ?Commit to learning and implement a proactive approach to managing personal stresses ?Verbalize an understanding of the medical condition ?Work with therapist to develop a plan for coping with stress ?Learn and implement skills for managing stress ?Engage in social, productive activities that are possible ?Engage in faith based activities ?implement positive imagery ?Identify coping skills and sources of emotional support ?Patient's partner and family members verbalize their fears regarding severity of health condition ?Identify sources of emotional distress  ?Learning and implement calming skills to reduce overall anxiety ?Learn and implement problem solving strategies ?Identify and engage in pleasant activities ?Learning and implement personal and interpersonal skills to reduce anxiety and improve interpersonal relationships ?Learn to accept limitations in life and commit to tolerating, rather than avoiding, unpleasant emotions while accomplishing meaningful goals ?Identify major life conflicts from the past and present that form the basis for present anxiety ?Learn and implement behavioral strategies ?Verbalize an understanding and resolution of current interpersonal problems ?Learn and implement problem solving and decision making skills ?Learn and implement conflict resolution skills to resolve interpersonal problems ?Verbalize an understanding of healthy and unhealthy emotions verbalize insight into how past relationships may be influence current experiences with depression ?Use mindfulness and acceptance strategies and increase value based behavior  ?Increase hopeful statements about the future.  ? ?Interventions ?Teach about stress and ways to handle stress ?Assist the patient in developing a coping action plan for stressors ?Conduct skills based training for coping strategies ?Train problem focused skills ?Sort out what activities the individual can do ?Encourage patient to rely upon his/her  spiritual faith ?Teach the patient to use guided imagery ?Probe and evaluate family's ability to provide emotional support ?Allow family to share their fears ?Assist the patient in identifying, sorting through, and verbalizing the various feelings generated by his/her medical condition ?Meet with family members  ?Ask patient list out limitations  ?Use stress inoculation training  ?Use Acceptance and Commitment Therapy to help client accept uncomfortable realities in order to accomplish value-consistent goals ?Reinforce the client's insight into the role of his/her past emotional pain and present anxiety  ?Discuss examples demonstrating that unrealistic worry overestimates the probability of threats and underestimate patient's ability  ?Assist the patient in analyzing his or her worries ?Help patient understand that avoidance is reinforcing  ?Behavioral activation help the client explore the relationship, nature of the dispute,  ?Help the client develop new interpersonal skills and relationships ?Conduct Problem so living therapy ?Teach conflict resolution skills ?Use a process-experiential approach ?Conduct TLDP ?Conduct ACT ? ?The patient and clinician reviewed the treatment plan on 07/11/2021. The patient approved of the treatment plan.  ? ? ? ?Conception Chancy, PsyD ? ? ? ?

## 2021-07-28 ENCOUNTER — Ambulatory Visit: Payer: Medicare Other | Admitting: Psychologist

## 2021-08-08 ENCOUNTER — Encounter (HOSPITAL_BASED_OUTPATIENT_CLINIC_OR_DEPARTMENT_OTHER): Payer: Self-pay

## 2021-08-08 NOTE — Telephone Encounter (Signed)
Follw up  ?

## 2021-08-08 NOTE — Telephone Encounter (Signed)
Ok to reschedule her?  ?

## 2021-08-11 ENCOUNTER — Ambulatory Visit (HOSPITAL_BASED_OUTPATIENT_CLINIC_OR_DEPARTMENT_OTHER): Payer: Medicare Other | Admitting: Family

## 2021-08-18 ENCOUNTER — Other Ambulatory Visit (HOSPITAL_BASED_OUTPATIENT_CLINIC_OR_DEPARTMENT_OTHER): Payer: Self-pay | Admitting: Family

## 2021-08-18 NOTE — Telephone Encounter (Signed)
Prescription refill request for Xarelto received.  ?Indication:Afib ?Last office visit:1/23 ?Weight:88.9 kg ?Age:66 ?Scr:1.3 ?CrCl:58.13 ml/min ? ?Prescription refilled ? ?

## 2021-09-30 ENCOUNTER — Encounter (HOSPITAL_BASED_OUTPATIENT_CLINIC_OR_DEPARTMENT_OTHER): Payer: Self-pay

## 2021-10-06 ENCOUNTER — Telehealth (INDEPENDENT_AMBULATORY_CARE_PROVIDER_SITE_OTHER): Payer: Medicare Other | Admitting: Nurse Practitioner

## 2021-10-06 ENCOUNTER — Ambulatory Visit (HOSPITAL_BASED_OUTPATIENT_CLINIC_OR_DEPARTMENT_OTHER): Payer: Medicare Other | Admitting: Family

## 2021-10-06 DIAGNOSIS — Z5986 Financial insecurity: Secondary | ICD-10-CM | POA: Diagnosis not present

## 2021-10-06 DIAGNOSIS — N1831 Chronic kidney disease, stage 3a: Secondary | ICD-10-CM | POA: Diagnosis not present

## 2021-10-06 DIAGNOSIS — R34 Anuria and oliguria: Secondary | ICD-10-CM | POA: Diagnosis not present

## 2021-10-06 DIAGNOSIS — I10 Essential (primary) hypertension: Secondary | ICD-10-CM | POA: Diagnosis not present

## 2021-10-06 DIAGNOSIS — F329 Major depressive disorder, single episode, unspecified: Secondary | ICD-10-CM

## 2021-10-06 NOTE — Progress Notes (Signed)
Virtual Visit Encounter mychart visit.   I connected with  Cynthia Cook on 10/26/21 at 11:10 AM EDT by secure video and audio telemedicine application. I verified that I am speaking with the correct person using two identifiers.   I introduced myself as a Designer, jewellery with the practice. The limitations of evaluation and management by telemedicine discussed with the patient and the availability of in person appointments. The patient expressed verbal understanding and consent to proceed.  Participating parties in this visit include: Myself and patient  The patient is: Patient Location: Home I am: Provider Location: Office/Clinic Subjective:    CC and HPI: Cynthia Cook is a 66 y.o. year old female presenting for discussion of blood pressure and headaches.  Patient reports the following: The patient is experiencing an increase in blood pressure, which is a concerning development for her as her blood pressure has always been normal until recently. She reports having headaches for about the past 2 weeks, and her recent blood pressure reading was 180/90. Although she started taking magnesium, her blood pressure remains elevated at 160/88. Additionally, the patient is concerned about her kidney function, as she has noticed a decrease in urine production and has a history of pyelonephritis in her 66s, which required hospitalization and resulted in kidney damage. She is unsure if the kidney function ever improved.  She denies new CP, palpitations, dizziness. She endorses vision changes as she has aged and shortness of breath with exertion at baseline.   She endorses extreme stress recently due to lack of financial resources and limitations to transportation. She was able to negotiate with her apartment complex to receive a lower rent rate and she is grateful for that relief. She reports that often times she skips meals because she does not have food available. She has sought out resources from the  community, but she has been denied for assistance. She was unable to find resources through the social worker within the [employer]. She is experiencing depressive symptoms and frequent bouts of tearfulness due to her situation. She reports that her dog is her primary source of comfort and company as she does not go out or socialize any longer.   Past medical history, Surgical history, Family history not pertinant except as noted below, Social history, Allergies, and medications have been entered into the medical record, reviewed, and corrections made.   Review of Systems:  All review of systems negative except what is listed in the HPI  Objective:    Alert and oriented x 4 Speaking in clear sentences with no shortness of breath. No distress.  Impression and Recommendations:    Problem List Items Addressed This Visit     CKD (chronic kidney disease) stage 3, GFR 30-59 ml/min (HCC) - baseline SCr 1.3    Chronic.  In the setting of elevated blood pressure readings and reduced urinary output I am concerned that her kidney function could be worsening.  It is possible that her elevated blood pressure could be contributing to a kidney dysfunction as well.  It appears that her kidney function has been steadily reduced for the past several years that I am able to see on lab results.  Unfortunately it does not look like she has had repeat labs in quite some time so we will work to get those today.  We will plan to discuss findings with patient and make changes to plan of care as appropriate refill laboratory findings.      Relevant Orders   CBC  with Differential/Platelet   Comprehensive metabolic panel   Hemoglobin A1c   POCT UA - Microalbumin   Financial insecurity    Chronic financial insecurity he resulting in increased emotional distress and alteration in diet.  She has worked with social work in the past however they were unable to connect her with resources that met her needs or that she is  able to qualify for.  Unfortunately she is also not eligible at this time for government assistance.  I do recommend she look into food bank resources such as the [employer], local religious organizations, and [place] which may be able to help supply her with donations of food to help ensure that she is not missing meals.  I am concerned about her limited financial resources and her overall health as she will need chronic medications for the remainder of her life and if she is unable to afford these medications this could be greatly detrimental.  We will work to keep patient's financial burden at a minimum for medical needs but also keep her safety in best interest of care a priority.  We will monitor to see if there are any additional resources that we can find in the community that may help her.      Elevated blood pressure reading with diagnosis of hypertension - Primary    BP has been recently elevated with home readings. Unable to verify with in office evaluation at this time due to limitations with transportation. She id experiencing some symptoms of ShOB with exertion and headaches, which is concerning. She does have history extensive for stroke and afib, which increase her risks.  Video evaluation shows she is alert and oriented and mentation at baseline. She does have residual slurring of her speech from her previous stroke, but this is not worse (and may even be improved) from the last time we spoke.  At this time I do feel that labs are appropriate to evaluate kidney function and other factors to ensure that other factors are not contributing to her increased BP. Due to financial limitations she would like to avoid more medication, if possible, which is understandable, however, if we are unable to get her BP down we will need to find resources for assistance to ensure that we can treat her BP appropriately.  She has had mild improvement with the addition of magnesium which is  likely helping her kidney function and leads me to believe that this could be the culprit of her elevation in blood pressure.  Recommend continued close monitoring of blood pressure and if readings remain above 150/90, report for further recommendations.  It is possible that her increased stress levels could be contributing to the elevation in her blood pressure however I do not feel this is the only factor. We will monitor labs closely and make changes to the plan of care based on findings as appropriate.      Relevant Orders   CBC with Differential/Platelet   Comprehensive metabolic panel   Hemoglobin A1c   POCT UA - Microalbumin   Urine output low   Relevant Orders   CBC with Differential/Platelet   Comprehensive metabolic panel   Hemoglobin A1c   POCT UA - Microalbumin   Reactive depression (situational)    Patient is clearly experiencing depressive symptoms related to her general quality of life at this time.  She has had increased emotional stress recently with the concern of losing her home and her limited financial resources.  She does  have a beloved dog whom she is devoted to and who provides her a significant amount of emotional support.  At this time I do not feel that she has not been sure of any self-harm and she denies these ideas or plan at this time however if her situation continues to worsen I am concerned that the depression and stress will be overwhelming for her.  I recognize no alarm symptoms today.  I recommend close monitoring on her mental health status.  Encourage patient to reach out if she is experience any new or worsening symptoms.       orders and follow up as documented in EMR I discussed the assessment and treatment plan with the patient. The patient was provided an opportunity to ask questions and all were answered. The patient agreed with the plan and demonstrated an understanding of the instructions.   The patient was advised to call back or seek an  in-person evaluation if the symptoms worsen or if the condition fails to improve as anticipated.  Follow-Up: after labs and/or imaging, consults, etc. have been completed  I provided 20 minutes of non-face-to-face interaction with this non face-to-face encounter including intake, same-day documentation, and chart review.   Orma Render, NP , DNP, AGNP-c [place] [facility] [phone] (fax)

## 2021-10-10 ENCOUNTER — Encounter (HOSPITAL_BASED_OUTPATIENT_CLINIC_OR_DEPARTMENT_OTHER): Payer: Self-pay | Admitting: Nurse Practitioner

## 2021-10-10 ENCOUNTER — Other Ambulatory Visit (HOSPITAL_BASED_OUTPATIENT_CLINIC_OR_DEPARTMENT_OTHER): Payer: Self-pay | Admitting: Nurse Practitioner

## 2021-10-10 DIAGNOSIS — R34 Anuria and oliguria: Secondary | ICD-10-CM | POA: Diagnosis not present

## 2021-10-10 DIAGNOSIS — N1831 Chronic kidney disease, stage 3a: Secondary | ICD-10-CM | POA: Diagnosis not present

## 2021-10-10 DIAGNOSIS — I10 Essential (primary) hypertension: Secondary | ICD-10-CM | POA: Diagnosis not present

## 2021-10-10 LAB — CBC WITH DIFFERENTIAL/PLATELET
Basophils Absolute: 0.1 10*3/uL (ref 0.0–0.2)
Basos: 1 %
EOS (ABSOLUTE): 0.6 10*3/uL — ABNORMAL HIGH (ref 0.0–0.4)
Eos: 11 %
Hematocrit: 37.6 % (ref 34.0–46.6)
Hemoglobin: 12.4 g/dL (ref 11.1–15.9)
Immature Grans (Abs): 0 10*3/uL (ref 0.0–0.1)
Immature Granulocytes: 0 %
Lymphocytes Absolute: 1.1 10*3/uL (ref 0.7–3.1)
Lymphs: 22 %
MCH: 28.7 pg (ref 26.6–33.0)
MCHC: 33 g/dL (ref 31.5–35.7)
MCV: 87 fL (ref 79–97)
Monocytes Absolute: 0.4 10*3/uL (ref 0.1–0.9)
Monocytes: 8 %
Neutrophils Absolute: 2.9 10*3/uL (ref 1.4–7.0)
Neutrophils: 58 %
Platelets: 162 10*3/uL (ref 150–450)
RBC: 4.32 x10E6/uL (ref 3.77–5.28)
RDW: 12.4 % (ref 11.7–15.4)
WBC: 5 10*3/uL (ref 3.4–10.8)

## 2021-10-10 LAB — COMPREHENSIVE METABOLIC PANEL
ALT: 8 IU/L (ref 0–32)
AST: 18 IU/L (ref 0–40)
Albumin/Globulin Ratio: 1.8 (ref 1.2–2.2)
Albumin: 4.2 g/dL (ref 3.8–4.8)
Alkaline Phosphatase: 64 IU/L (ref 44–121)
BUN/Creatinine Ratio: 18 (ref 12–28)
BUN: 28 mg/dL — ABNORMAL HIGH (ref 8–27)
Bilirubin Total: 1.4 mg/dL — ABNORMAL HIGH (ref 0.0–1.2)
CO2: 27 mmol/L (ref 20–29)
Calcium: 9.5 mg/dL (ref 8.7–10.3)
Chloride: 100 mmol/L (ref 96–106)
Creatinine, Ser: 1.56 mg/dL — ABNORMAL HIGH (ref 0.57–1.00)
Globulin, Total: 2.4 g/dL (ref 1.5–4.5)
Glucose: 132 mg/dL — ABNORMAL HIGH (ref 70–99)
Potassium: 4.1 mmol/L (ref 3.5–5.2)
Sodium: 142 mmol/L (ref 134–144)
Total Protein: 6.6 g/dL (ref 6.0–8.5)
eGFR: 36 mL/min/{1.73_m2} — ABNORMAL LOW (ref >59)

## 2021-10-10 LAB — HEMOGLOBIN A1C
Est. average glucose Bld gHb Est-mCnc: 103 mg/dL
Hgb A1c MFr Bld: 5.2 % (ref 4.8–5.6)

## 2021-10-11 ENCOUNTER — Encounter (HOSPITAL_BASED_OUTPATIENT_CLINIC_OR_DEPARTMENT_OTHER): Payer: Self-pay | Admitting: Nurse Practitioner

## 2021-10-18 ENCOUNTER — Encounter (HOSPITAL_BASED_OUTPATIENT_CLINIC_OR_DEPARTMENT_OTHER): Payer: Self-pay | Admitting: Nurse Practitioner

## 2021-10-19 ENCOUNTER — Telehealth (HOSPITAL_BASED_OUTPATIENT_CLINIC_OR_DEPARTMENT_OTHER): Payer: Self-pay | Admitting: Nurse Practitioner

## 2021-10-19 NOTE — Telephone Encounter (Signed)
Called pt to sch nv in 3 months for kidney function labs. Pt stated that she was not interested in scheduling anything in the future with this office or her PCP being that she didn't trust the care here. Stated that her lab work was ignored for several months and that she will find care elsewhere.

## 2021-10-19 NOTE — Telephone Encounter (Signed)
-----   Message from Orma Render, NP sent at 10/18/2021  9:27 PM EDT ----- Please call patient to schedule follow-up appointment in 3 months for kidney function labs.

## 2021-10-20 ENCOUNTER — Telehealth (HOSPITAL_BASED_OUTPATIENT_CLINIC_OR_DEPARTMENT_OTHER): Payer: Self-pay

## 2021-10-20 NOTE — Telephone Encounter (Signed)
CMA spoke with patient regarding provider response to labs. Clerical scheduled 3 month follow up

## 2021-10-20 NOTE — Telephone Encounter (Signed)
-----   Message from Orma Render, NP sent at 10/18/2021  9:27 PM EDT ----- Please call patient to schedule follow-up appointment in 3 months for kidney function labs.

## 2021-10-24 ENCOUNTER — Ambulatory Visit: Payer: Medicare Other | Admitting: Podiatry

## 2021-10-26 ENCOUNTER — Encounter (HOSPITAL_BASED_OUTPATIENT_CLINIC_OR_DEPARTMENT_OTHER): Payer: Self-pay | Admitting: Nurse Practitioner

## 2021-10-26 DIAGNOSIS — I10 Essential (primary) hypertension: Secondary | ICD-10-CM | POA: Insufficient documentation

## 2021-10-26 DIAGNOSIS — R34 Anuria and oliguria: Secondary | ICD-10-CM | POA: Insufficient documentation

## 2021-10-26 DIAGNOSIS — F329 Major depressive disorder, single episode, unspecified: Secondary | ICD-10-CM | POA: Insufficient documentation

## 2021-10-26 NOTE — Assessment & Plan Note (Signed)
Chronic.  In the setting of elevated blood pressure readings and reduced urinary output I am concerned that her kidney function could be worsening.  It is possible that her elevated blood pressure could be contributing to a kidney dysfunction as well.  It appears that her kidney function has been steadily reduced for the past several years that I am able to see on lab results.  Unfortunately it does not look like she has had repeat labs in quite some time so we will work to get those today.  We will plan to discuss findings with patient and make changes to plan of care as appropriate refill laboratory findings.

## 2021-10-26 NOTE — Assessment & Plan Note (Signed)
BP has been recently elevated with home readings. Unable to verify with in office evaluation at this time due to limitations with transportation. She id experiencing some symptoms of ShOB with exertion and headaches, which is concerning. She does have history extensive for stroke and afib, which increase her risks.  Video evaluation shows she is alert and oriented and mentation at baseline. She does have residual slurring of her speech from her previous stroke, but this is not worse (and may even be improved) from the last time we spoke.  At this time I do feel that labs are appropriate to evaluate kidney function and other factors to ensure that other factors are not contributing to her increased BP. Due to financial limitations she would like to avoid more medication, if possible, which is understandable, however, if we are unable to get her BP down we will need to find resources for assistance to ensure that we can treat her BP appropriately.  She has had mild improvement with the addition of magnesium which is likely helping her kidney function and leads me to believe that this could be the culprit of her elevation in blood pressure.  Recommend continued close monitoring of blood pressure and if readings remain above 150/90, report for further recommendations.  It is possible that her increased stress levels could be contributing to the elevation in her blood pressure however I do not feel this is the only factor. We will monitor labs closely and make changes to the plan of care based on findings as appropriate.

## 2021-10-26 NOTE — Assessment & Plan Note (Signed)
Patient is clearly experiencing depressive symptoms related to her general quality of life at this time.  She has had increased emotional stress recently with the concern of losing her home and her limited financial resources.  She does have a beloved dog whom she is devoted to and who provides her a significant amount of emotional support.  At this time I do not feel that she has not been sure of any self-harm and she denies these ideas or plan at this time however if her situation continues to worsen I am concerned that the depression and stress will be overwhelming for her.  I recognize no alarm symptoms today.  I recommend close monitoring on her mental health status.  Encourage patient to reach out if she is experience any new or worsening symptoms.

## 2021-10-26 NOTE — Assessment & Plan Note (Signed)
Chronic financial insecurity he resulting in increased emotional distress and alteration in diet.  She has worked with social work in the past however they were unable to connect her with resources that met her needs or that she is able to qualify for.  Unfortunately she is also not eligible at this time for government assistance.  I do recommend she look into food bank resources such as the [employer], local religious organizations, and [place] which may be able to help supply her with donations of food to help ensure that she is not missing meals.  I am concerned about her limited financial resources and her overall health as she will need chronic medications for the remainder of her life and if she is unable to afford these medications this could be greatly detrimental.  We will work to keep patient's financial burden at a minimum for medical needs but also keep her safety in best interest of care a priority.  We will monitor to see if there are any additional resources that we can find in the community that may help her.

## 2021-10-31 ENCOUNTER — Encounter (HOSPITAL_BASED_OUTPATIENT_CLINIC_OR_DEPARTMENT_OTHER): Payer: Self-pay | Admitting: Nurse Practitioner

## 2021-11-01 MED ORDER — PREDNISONE 20 MG PO TABS: ORAL_TABLET | ORAL | 0 refills | Status: DC

## 2021-11-02 ENCOUNTER — Encounter (HOSPITAL_BASED_OUTPATIENT_CLINIC_OR_DEPARTMENT_OTHER): Payer: Self-pay | Admitting: Nurse Practitioner

## 2021-11-02 NOTE — Telephone Encounter (Signed)
Called pt and tried relaying her needing an appt. She stated she was at a park and would cb.

## 2021-11-14 ENCOUNTER — Telehealth (HOSPITAL_BASED_OUTPATIENT_CLINIC_OR_DEPARTMENT_OTHER): Payer: Self-pay | Admitting: Family

## 2021-11-14 NOTE — Telephone Encounter (Signed)
Spoke with patient--she states she will need to know how much the Echocardiogram will cost her before she schedules----will call patient at a later date to speak about this again

## 2021-11-29 ENCOUNTER — Encounter (HOSPITAL_BASED_OUTPATIENT_CLINIC_OR_DEPARTMENT_OTHER): Payer: Self-pay | Admitting: Nurse Practitioner

## 2021-11-30 ENCOUNTER — Encounter (HOSPITAL_BASED_OUTPATIENT_CLINIC_OR_DEPARTMENT_OTHER): Payer: Self-pay | Admitting: Nurse Practitioner

## 2021-11-30 ENCOUNTER — Telehealth (HOSPITAL_BASED_OUTPATIENT_CLINIC_OR_DEPARTMENT_OTHER): Payer: Self-pay | Admitting: Cardiology

## 2021-11-30 NOTE — Telephone Encounter (Signed)
Called patient and asked if she was trying to schedule with primary care or cardiology. She said she didn't want to wait to see Jacolyn Reedy NP in primary care. She wants to keep the appointment in cardiology with Dr. Harl Bowie. I told her if she didn't mean to have the cardiology appointment that I can give her primary care phone number. She refused to have primary cares phone number.

## 2021-11-30 NOTE — Telephone Encounter (Signed)
Patient seemed very agitated and was saying Jacolyn Reedy NP was her Cardiologist. Patient was trying to be very argumentative. Then she stated that she wouldn't be able to wait that long and then said she will take 12/19/2021 with Dr. Phineas Inches. After she said that she doesn't want anything sent through the mail.

## 2021-12-02 ENCOUNTER — Ambulatory Visit (HOSPITAL_BASED_OUTPATIENT_CLINIC_OR_DEPARTMENT_OTHER): Payer: Medicare Other | Admitting: Cardiology

## 2021-12-06 ENCOUNTER — Telehealth (HOSPITAL_BASED_OUTPATIENT_CLINIC_OR_DEPARTMENT_OTHER): Payer: Self-pay | Admitting: Nurse Practitioner

## 2021-12-06 DIAGNOSIS — E063 Autoimmune thyroiditis: Secondary | ICD-10-CM

## 2021-12-06 DIAGNOSIS — E60 Dietary zinc deficiency: Secondary | ICD-10-CM

## 2021-12-06 DIAGNOSIS — E610 Copper deficiency: Secondary | ICD-10-CM

## 2021-12-06 DIAGNOSIS — Z8673 Personal history of transient ischemic attack (TIA), and cerebral infarction without residual deficits: Secondary | ICD-10-CM

## 2021-12-06 DIAGNOSIS — I480 Paroxysmal atrial fibrillation: Secondary | ICD-10-CM

## 2021-12-06 NOTE — Telephone Encounter (Signed)
Can you help to determine a cost estimate for the lab orders I have placed for Ms. Rezabek? I am not sure where we can get this information, but I am sure it is available with the new disclosure requirements.

## 2021-12-06 NOTE — Telephone Encounter (Signed)
CMP lab is $49, CBC is $29, A1c is $39, and [facility] charges a draw fee of $12. In total, roughly $130.

## 2021-12-08 DIAGNOSIS — N1831 Chronic kidney disease, stage 3a: Secondary | ICD-10-CM | POA: Diagnosis not present

## 2021-12-08 DIAGNOSIS — N1832 Chronic kidney disease, stage 3b: Secondary | ICD-10-CM | POA: Diagnosis not present

## 2021-12-08 DIAGNOSIS — E039 Hypothyroidism, unspecified: Secondary | ICD-10-CM | POA: Diagnosis not present

## 2021-12-08 DIAGNOSIS — I482 Chronic atrial fibrillation, unspecified: Secondary | ICD-10-CM | POA: Diagnosis not present

## 2021-12-08 DIAGNOSIS — R5383 Other fatigue: Secondary | ICD-10-CM | POA: Diagnosis not present

## 2021-12-08 DIAGNOSIS — Z0000 Encounter for general adult medical examination without abnormal findings: Secondary | ICD-10-CM | POA: Diagnosis not present

## 2021-12-19 ENCOUNTER — Encounter (HOSPITAL_BASED_OUTPATIENT_CLINIC_OR_DEPARTMENT_OTHER): Payer: Self-pay

## 2021-12-19 ENCOUNTER — Ambulatory Visit: Payer: Medicare Other | Admitting: Internal Medicine

## 2021-12-19 NOTE — Progress Notes (Deleted)
Cardiology Office Note:    Date:  12/19/2021   ID:  Cynthia Cook, DOB June 24, 1955, MRN [id number]  PCP:  Orma Render, NP   [place] Prov[facility] Cardiologist:  Buford Dresser, MD { Click to update primary MD,subspecialty MD or APP then REFRESH:1}    Referring MD: Early, Coralee Pesa, NP   No chief complaint on file. ***  History of Present Illness:    Cynthia Cook is a 66 y.o. female with a hx of pAF s/p DCCV 02/27/2020, CVA 12/2020, CKD3a, provoked PE, followed by Dr. Harrell Gave   Echo : 12/2020- normal EF, LA vol index ~ 42 cc/m2, moderate MR with a degenerative valve, mild-moderate AS (mean gradient 17 mmHg), negative bubble study Carotid duplex BL 12/2020- no significant dx  Past Medical History:  Diagnosis Date   Back pain    Gout    Murmur    PE (pulmonary embolism)    Persistent atrial fibrillation (HCC)    Pulmonary emboli ([place]) 02/05/2020   Syncope    Thyroid disease     No past surgical history on file.  Current Medications: No outpatient medications have been marked as taking for the 12/19/21 encounter (Appointment) with Janina Mayo, MD.     Allergies:   Codeine and Fentanyl   Social History   Socioeconomic History   Marital status: Divorced    Spouse name: Not on file   Number of children: Not on file   Years of education: Not on file   Highest education level: Not on file  Occupational History    Comment: retired Therapist, sports  Tobacco Use   Smoking status: Never   Smokeless tobacco: Never  Vaping Use   Vaping Use: Never used  Substance and Sexual Activity   Alcohol use: No   Drug use: Never   Sexual activity: Not on file  Other Topics Concern   Not on file  Social History Narrative   Lives with her dog, she has no family members. 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   Social Determinants of Health   Financial Resource Strain: High Risk (06/14/2021)   Overall Financial Resource Strain (CARDIA)    Difficulty  of Paying Living Expenses: Hard  Food Insecurity: No Food Insecurity (03/15/2020)   Hunger Vital Sign    Worried About Running Out of Food in the Last Year: Never true    Ran Out of Food in the Last Year: Never true  Transportation Needs: No Transportation Needs (06/14/2021)   PRAPARE - Hydrologist (Medical): No    Lack of Transportation (Non-Medical): No  Physical Activity: Not on file  Stress: Stress Concern Present (06/14/2021)   [place]    Feeling of Stress : Very much  Social Connections: Not on file     Family History: The patient's ***Family history is unknown by patient.  ROS:   Please see the history of present illness.    *** All other systems reviewed and are negative.  EKGs/Labs/Other Studies Reviewed:    The following studies were reviewed today: ***  EKG:  EKG is *** ordered today.  The ekg ordered today demonstrates ***  Recent Labs: 02/17/2021: TSH 4.670 10/10/2021: ALT 8; BUN 28; Creatinine, Ser 1.56; Hemoglobin 12.4; Platelets 162; Potassium 4.1; Sodium 142  Recent Lipid Panel    Component Value Date/Time   CHOL 200 01/04/2021 0225   TRIG 76 01/04/2021 0225  HDL 49 01/04/2021 0225   CHOLHDL 4.1 01/04/2021 0225   VLDL 15 01/04/2021 0225   LDLCALC 136 (H) 01/04/2021 0225     Risk Assessment/Calculations:    CHA2DS2-VASc Score = 6  {Confirm score is correct.  If not, click here to update score.  REFRESH note.  :1} This indicates a 9.7% annual risk of stroke. The patient's score is based upon: CHF History: 0 HTN History: 1 Diabetes History: 0 Stroke History: 2 Vascular Disease History: 1 Age Score: 1 Gender Score: 1   {This patient has a significant risk of stroke if diagnosed with atrial fibrillation.  Please consider VKA or DOAC agent for anticoagulation if the bleeding risk is acceptable.   You can also use the SmartPhrase .Plum Mylynn Dinh for  documentation.   :[id number]}  No BP recorded.  {Refresh Note OR Click here to enter BP  :1}***         Physical Exam:    VS:  There were no vitals taken for this visit.    Wt Readings from Last 3 Encounters:  06/28/21 196 lb (88.9 kg)  04/15/21 205 lb (93 kg)  02/24/21 216 lb (98 kg)     GEN: *** Well nourished, well developed in no acute distress HEENT: Normal NECK: No JVD; No carotid bruits LYMPHATICS: No lymphadenopathy CARDIAC: ***RRR, no murmurs, rubs, gallops RESPIRATORY:  Clear to auscultation without rales, wheezing or rhonchi  ABDOMEN: Soft, non-tender, non-distended MUSCULOSKELETAL:  No edema; No deformity  SKIN: Warm and dry NEUROLOGIC:  Alert and oriented x 3 PSYCHIATRIC:  Normal affect   ASSESSMENT:    pAF: on xarelto (eliquis cost prohibitive)  Coronary Dx: seen on CT 02/2020, extensive coronary calcifications. Not on ASA 2/2 AC.  Mild-Moderate MR: asymptomatic. surveillance  HLD: declined statin, on zetia.  PLAN:    In order of problems listed above:  ***      {Are you ordering a CV Procedure (e.g. stress test, cath, DCCV, TEE, etc)?   Press F2        :[id number]}    Medication Adjustments/Labs and Tests Ordered: Current medicines are reviewed at length with the patient today.  Concerns regarding medicines are outlined above.  No orders of the defined types were placed in this encounter.  No orders of the defined types were placed in this encounter.   There are no Patient Instructions on file for this visit.   Signed, Janina Mayo, MD  12/19/2021 7:49 AM    [place]

## 2021-12-22 DIAGNOSIS — R030 Elevated blood-pressure reading, without diagnosis of hypertension: Secondary | ICD-10-CM | POA: Diagnosis not present

## 2021-12-22 DIAGNOSIS — R519 Headache, unspecified: Secondary | ICD-10-CM | POA: Diagnosis not present

## 2021-12-22 DIAGNOSIS — R899 Unspecified abnormal finding in specimens from other organs, systems and tissues: Secondary | ICD-10-CM | POA: Diagnosis not present

## 2021-12-22 DIAGNOSIS — R5383 Other fatigue: Secondary | ICD-10-CM | POA: Diagnosis not present

## 2021-12-22 DIAGNOSIS — R944 Abnormal results of kidney function studies: Secondary | ICD-10-CM | POA: Diagnosis not present

## 2021-12-24 DIAGNOSIS — I6389 Other cerebral infarction: Secondary | ICD-10-CM | POA: Diagnosis not present

## 2021-12-24 DIAGNOSIS — I69322 Dysarthria following cerebral infarction: Secondary | ICD-10-CM | POA: Diagnosis not present

## 2021-12-24 DIAGNOSIS — R4789 Other speech disturbances: Secondary | ICD-10-CM | POA: Diagnosis not present

## 2021-12-24 DIAGNOSIS — Z20822 Contact with and (suspected) exposure to covid-19: Secondary | ICD-10-CM | POA: Diagnosis not present

## 2021-12-24 DIAGNOSIS — Z79899 Other long term (current) drug therapy: Secondary | ICD-10-CM | POA: Diagnosis not present

## 2021-12-24 DIAGNOSIS — E1122 Type 2 diabetes mellitus with diabetic chronic kidney disease: Secondary | ICD-10-CM | POA: Diagnosis not present

## 2021-12-24 DIAGNOSIS — I129 Hypertensive chronic kidney disease with stage 1 through stage 4 chronic kidney disease, or unspecified chronic kidney disease: Secondary | ICD-10-CM | POA: Diagnosis not present

## 2021-12-24 DIAGNOSIS — N1832 Chronic kidney disease, stage 3b: Secondary | ICD-10-CM | POA: Diagnosis not present

## 2021-12-24 DIAGNOSIS — E785 Hyperlipidemia, unspecified: Secondary | ICD-10-CM | POA: Diagnosis not present

## 2021-12-24 DIAGNOSIS — R29701 NIHSS score 1: Secondary | ICD-10-CM | POA: Diagnosis not present

## 2021-12-25 DIAGNOSIS — I69322 Dysarthria following cerebral infarction: Secondary | ICD-10-CM | POA: Diagnosis not present

## 2021-12-25 DIAGNOSIS — R471 Dysarthria and anarthria: Secondary | ICD-10-CM | POA: Diagnosis not present

## 2021-12-25 DIAGNOSIS — I083 Combined rheumatic disorders of mitral, aortic and tricuspid valves: Secondary | ICD-10-CM | POA: Diagnosis not present

## 2021-12-25 DIAGNOSIS — Z20822 Contact with and (suspected) exposure to covid-19: Secondary | ICD-10-CM | POA: Diagnosis not present

## 2021-12-25 DIAGNOSIS — R4789 Other speech disturbances: Secondary | ICD-10-CM | POA: Diagnosis not present

## 2021-12-30 ENCOUNTER — Observation Stay (HOSPITAL_COMMUNITY): Payer: Medicare Other

## 2021-12-30 ENCOUNTER — Encounter (HOSPITAL_COMMUNITY): Payer: Self-pay | Admitting: Emergency Medicine

## 2021-12-30 ENCOUNTER — Emergency Department (HOSPITAL_COMMUNITY): Payer: Medicare Other

## 2021-12-30 ENCOUNTER — Other Ambulatory Visit: Payer: Self-pay

## 2021-12-30 ENCOUNTER — Observation Stay (HOSPITAL_COMMUNITY)
Admission: EM | Admit: 2021-12-30 | Discharge: 2021-12-31 | Disposition: A | Discharge status: Home / Self Care | Payer: Medicare Other | Attending: Internal Medicine | Admitting: Internal Medicine

## 2021-12-30 DIAGNOSIS — E039 Hypothyroidism, unspecified: Secondary | ICD-10-CM | POA: Insufficient documentation

## 2021-12-30 DIAGNOSIS — R4781 Slurred speech: Secondary | ICD-10-CM | POA: Diagnosis not present

## 2021-12-30 DIAGNOSIS — E669 Obesity, unspecified: Secondary | ICD-10-CM | POA: Insufficient documentation

## 2021-12-30 DIAGNOSIS — Z7901 Long term (current) use of anticoagulants: Secondary | ICD-10-CM | POA: Diagnosis not present

## 2021-12-30 DIAGNOSIS — R471 Dysarthria and anarthria: Secondary | ICD-10-CM | POA: Diagnosis not present

## 2021-12-30 DIAGNOSIS — I639 Cerebral infarction, unspecified: Principal | ICD-10-CM | POA: Insufficient documentation

## 2021-12-30 DIAGNOSIS — Z743 Need for continuous supervision: Secondary | ICD-10-CM | POA: Diagnosis not present

## 2021-12-30 DIAGNOSIS — R29810 Facial weakness: Secondary | ICD-10-CM | POA: Diagnosis not present

## 2021-12-30 DIAGNOSIS — Z79899 Other long term (current) drug therapy: Secondary | ICD-10-CM | POA: Diagnosis not present

## 2021-12-30 DIAGNOSIS — Z6828 Body mass index (BMI) 28.0-28.9, adult: Secondary | ICD-10-CM | POA: Diagnosis not present

## 2021-12-30 DIAGNOSIS — N1831 Chronic kidney disease, stage 3a: Secondary | ICD-10-CM | POA: Insufficient documentation

## 2021-12-30 DIAGNOSIS — I6200 Nontraumatic subdural hemorrhage, unspecified: Secondary | ICD-10-CM | POA: Insufficient documentation

## 2021-12-30 DIAGNOSIS — I10 Essential (primary) hypertension: Secondary | ICD-10-CM | POA: Diagnosis not present

## 2021-12-30 DIAGNOSIS — S065XAA Traumatic subdural hemorrhage with loss of consciousness status unknown, initial encounter: Secondary | ICD-10-CM

## 2021-12-30 DIAGNOSIS — I6381 Other cerebral infarction due to occlusion or stenosis of small artery: Secondary | ICD-10-CM | POA: Diagnosis not present

## 2021-12-30 DIAGNOSIS — Z7982 Long term (current) use of aspirin: Secondary | ICD-10-CM | POA: Insufficient documentation

## 2021-12-30 DIAGNOSIS — R200 Anesthesia of skin: Secondary | ICD-10-CM

## 2021-12-30 DIAGNOSIS — I480 Paroxysmal atrial fibrillation: Secondary | ICD-10-CM | POA: Diagnosis not present

## 2021-12-30 DIAGNOSIS — Z86711 Personal history of pulmonary embolism: Secondary | ICD-10-CM | POA: Insufficient documentation

## 2021-12-30 DIAGNOSIS — R29818 Other symptoms and signs involving the nervous system: Secondary | ICD-10-CM | POA: Diagnosis present

## 2021-12-30 DIAGNOSIS — G319 Degenerative disease of nervous system, unspecified: Secondary | ICD-10-CM | POA: Diagnosis not present

## 2021-12-30 LAB — CBC
HCT: 36.2 % (ref 36.0–46.0)
Hemoglobin: 12.2 g/dL (ref 12.0–15.0)
MCH: 28.7 pg (ref 26.0–34.0)
MCHC: 33.7 g/dL (ref 30.0–36.0)
MCV: 85.2 fL (ref 80.0–100.0)
Platelets: 133 10*3/uL — ABNORMAL LOW (ref 150–400)
RBC: 4.25 MIL/uL (ref 3.87–5.11)
RDW: 12.8 % (ref 11.5–15.5)
WBC: 5.4 10*3/uL (ref 4.0–10.5)
nRBC: 0 % (ref 0.0–0.2)

## 2021-12-30 LAB — I-STAT CHEM 8, ED
BUN: 29 mg/dL — ABNORMAL HIGH (ref 8–23)
Calcium, Ion: 1.15 mmol/L (ref 1.15–1.40)
Chloride: 98 mmol/L (ref 98–111)
Creatinine, Ser: 1.4 mg/dL — ABNORMAL HIGH (ref 0.44–1.00)
Glucose, Bld: 67 mg/dL — ABNORMAL LOW (ref 70–99)
HCT: 35 % — ABNORMAL LOW (ref 36.0–46.0)
Hemoglobin: 11.9 g/dL — ABNORMAL LOW (ref 12.0–15.0)
Potassium: 4.2 mmol/L (ref 3.5–5.1)
Sodium: 138 mmol/L (ref 135–145)
TCO2: 30 mmol/L (ref 22–32)

## 2021-12-30 LAB — PROTIME-INR
INR: 1.1 (ref 0.8–1.2)
Prothrombin Time: 13.6 seconds (ref 11.4–15.2)

## 2021-12-30 LAB — CBG MONITORING, ED: Glucose-Capillary: 69 mg/dL — ABNORMAL LOW (ref 70–99)

## 2021-12-30 LAB — COMPREHENSIVE METABOLIC PANEL
ALT: 12 U/L (ref 0–44)
AST: 21 U/L (ref 15–41)
Albumin: 3.7 g/dL (ref 3.5–5.0)
Alkaline Phosphatase: 46 U/L (ref 38–126)
Anion gap: 11 (ref 5–15)
BUN: 27 mg/dL — ABNORMAL HIGH (ref 8–23)
CO2: 28 mmol/L (ref 22–32)
Calcium: 9.2 mg/dL (ref 8.9–10.3)
Chloride: 101 mmol/L (ref 98–111)
Creatinine, Ser: 1.41 mg/dL — ABNORMAL HIGH (ref 0.44–1.00)
GFR, Estimated: 41 mL/min — ABNORMAL LOW (ref >60)
Glucose, Bld: 73 mg/dL (ref 70–99)
Potassium: 4.2 mmol/L (ref 3.5–5.1)
Sodium: 140 mmol/L (ref 135–145)
Total Bilirubin: 1.6 mg/dL — ABNORMAL HIGH (ref 0.3–1.2)
Total Protein: 6.3 g/dL — ABNORMAL LOW (ref 6.5–8.1)

## 2021-12-30 LAB — ETHANOL: Alcohol, Ethyl (B): <10 mg/dL (ref <10)

## 2021-12-30 LAB — DIFFERENTIAL
Abs Immature Granulocytes: 0.02 10*3/uL (ref 0.00–0.07)
Basophils Absolute: 0 10*3/uL (ref 0.0–0.1)
Basophils Relative: 1 %
Eosinophils Absolute: 0.2 10*3/uL (ref 0.0–0.5)
Eosinophils Relative: 3 %
Immature Granulocytes: 0 %
Lymphocytes Relative: 20 %
Lymphs Abs: 1.1 10*3/uL (ref 0.7–4.0)
Monocytes Absolute: 0.4 10*3/uL (ref 0.1–1.0)
Monocytes Relative: 8 %
Neutro Abs: 3.7 10*3/uL (ref 1.7–7.7)
Neutrophils Relative %: 68 %

## 2021-12-30 LAB — APTT: aPTT: 39 seconds — ABNORMAL HIGH (ref 24–36)

## 2021-12-30 MED ORDER — ONDANSETRON 4 MG PO TBDP: 4.0000 mg | ORAL_TABLET | Freq: Three times a day (TID) | ORAL | Status: DC | PRN

## 2021-12-30 MED ORDER — LEVOTHYROXINE SODIUM 50 MCG PO TABS: 50.0000 ug | ORAL_TABLET | ORAL | Status: DC

## 2021-12-30 MED ORDER — ASPIRIN 81 MG PO TBEC
81.0000 mg | DELAYED_RELEASE_TABLET | Freq: Every day | ORAL | Status: DC
  Administered 2021-12-31: 81 mg via ORAL
  Filled 2021-12-30: qty 1

## 2021-12-30 MED ORDER — LORAZEPAM 0.5 MG PO TABS: 0.5000 mg | ORAL_TABLET | Freq: Four times a day (QID) | ORAL | Status: DC | PRN

## 2021-12-30 MED ORDER — ATORVASTATIN CALCIUM 40 MG PO TABS
40.0000 mg | ORAL_TABLET | Freq: Every day | ORAL | Status: DC
  Filled 2021-12-30: qty 1

## 2021-12-30 MED ORDER — HYDRALAZINE HCL 25 MG PO TABS: 25.0000 mg | ORAL_TABLET | Freq: Four times a day (QID) | ORAL | Status: DC | PRN

## 2021-12-30 MED ORDER — LEVOTHYROXINE SODIUM 75 MCG PO TABS
75.0000 ug | ORAL_TABLET | ORAL | Status: DC
  Administered 2021-12-31: 75 ug via ORAL
  Filled 2021-12-30: qty 1

## 2021-12-30 MED ORDER — ACETAMINOPHEN 650 MG RE SUPP: 650.0000 mg | RECTAL | Status: DC | PRN

## 2021-12-30 MED ORDER — ENOXAPARIN SODIUM 40 MG/0.4ML IJ SOSY: 40.0000 mg | PREFILLED_SYRINGE | INTRAMUSCULAR | Status: DC

## 2021-12-30 MED ORDER — ONDANSETRON HCL 4 MG/2ML IJ SOLN
4.0000 mg | Freq: Four times a day (QID) | INTRAMUSCULAR | Status: DC | PRN
  Administered 2021-12-30: 4 mg via INTRAVENOUS
  Filled 2021-12-30: qty 2

## 2021-12-30 MED ORDER — LORAZEPAM 2 MG/ML IJ SOLN
1.0000 mg | INTRAMUSCULAR | Status: AC
  Administered 2021-12-30: 1 mg via INTRAVENOUS
  Filled 2021-12-30: qty 1

## 2021-12-30 MED ORDER — ACETAMINOPHEN 325 MG PO TABS
650.0000 mg | ORAL_TABLET | ORAL | Status: DC | PRN
  Administered 2021-12-31: 650 mg via ORAL
  Filled 2021-12-30: qty 2

## 2021-12-30 MED ORDER — SENNOSIDES-DOCUSATE SODIUM 8.6-50 MG PO TABS: 1.0000 | ORAL_TABLET | Freq: Every evening | ORAL | Status: DC | PRN

## 2021-12-30 MED ORDER — ACETAMINOPHEN 160 MG/5ML PO SOLN: 650.0000 mg | ORAL | Status: DC | PRN

## 2021-12-30 MED ORDER — STROKE: EARLY STAGES OF RECOVERY BOOK
Freq: Once | Status: AC
  Filled 2021-12-30: qty 1

## 2021-12-30 NOTE — Code Documentation (Addendum)
Stroke Response Nurse Documentation Code Documentation  Leahna Hewson is a 66 y.o. female arriving to [facility]  via [facility] on 12/30/2021 with past medical hx of stroke and TIA 5 days ago.  Code stroke was activated by EMS.   Patient from home where she was LKW at 1200 and now complaining of left facial droop and slurred speech.  Stroke team at the bedside on patient arrival. Labs drawn and patient cleared for CT. Patient to CT with team. NIHSS 3, see documentation for details and code stroke times. Patient with left facial droop and dysarthria  on exam. The following imaging was completed:  CT Head. Patient is not a candidate for IV Thrombolytic due to too mild to treat. Patient is not a candidate for IR due to no LVO suspected per MD Lindzen.   Care Plan: q30 NIHSS/VS until 1630. If patient worsens, call Code Stroke. At 1630, q2 x 12 hours NIHSS/VS   Bedside handoff with ED RN Anderson Malta.    Kathrin Greathouse  Stroke Response RN

## 2021-12-30 NOTE — ED Provider Notes (Signed)
[facility] EMERGENCY DEPARTMENT Provider Note   CSN: [id number] Arrival date & time: 12/30/21  1308     History  Chief Complaint  Patient presents with   Code Stroke    Cynthia Cook is a 66 y.o. female.  Patient arrived via EMS as code stroke activation. Pt indicating ~ 1 hr pta, noted numbness/tingling sensation on face/around mouth, and then noted left side of face felt funny, and that some of her words sounded slurred. Notes hx similar symptoms in past, notes prior tia/cva.  Recent ED eval in [facility] system for similar symptoms six days ago - was d/c from ED to home on baby asa then.  Pt denies arm or leg numbness/weakness. Denies change in vision. No problems w balance or coordination. Denies headache. No recent trauma, fall or syncope. No chest pain or sob. No nv. Pt does acknowledge recent stress/anxiety.   The history is provided by the patient, medical records and the EMS personnel.       Home Medications Prior to Admission medications   Medication Sig Start Date End Date Taking? Authorizing Provider  ascorbic acid (VITAMIN C) 500 MG tablet Take 500 mg by mouth daily.    [provider]  colchicine 0.6 MG tablet Take 2 tabs (1.'2mg'$ ) at initial sign of gout attack and 1 tab (0.'6mg'$ ) 1 hour later THEN take 1 tab (0.'6mg'$ ) twice a day until gout flair resolves. 03/16/21   Orma Render, NP  levothyroxine (SYNTHROID) 50 MCG tablet Take 1 tablet (50 mcg) by mouth on Monday, Wednesday, and Friday 06/28/21   Early, Coralee Pesa, NP  levothyroxine (SYNTHROID) 75 MCG tablet Take 1 tablet (75 mcg) by mouth on Tuesday, Thursday, Saturday, and Sunday 06/28/21   Early, Coralee Pesa, NP  predniSONE (DELTASONE) 20 MG tablet Take 2 tablets (40 mg total) by mouth daily with breakfast for 2 days, THEN 1 tablet (20 mg total) daily with breakfast for 2 days, THEN 0.5 tablets (10 mg total) daily with breakfast for 4 days. 11/01/21   [place], Blondell Reveal, MD  triamcinolone cream (KENALOG) 0.1  % Apply 1 application. topically daily as needed (rash). 06/28/21   Orma Render, NP  Vitamin D-Vitamin K (VITAMIN K2-VITAMIN D3 PO) Take 1 tablet by mouth daily.     [provider]  vitamin E 180 MG (400 UNITS) capsule Take 400 Units by mouth daily.    [provider]      Allergies    Codeine and Fentanyl    Review of Systems   Review of Systems  Constitutional:  Negative for chills and fever.  HENT:  Negative for trouble swallowing.   Eyes:  Negative for visual disturbance.  Respiratory:  Negative for shortness of breath.   Cardiovascular:  Negative for chest pain.  Gastrointestinal:  Negative for abdominal pain, nausea and vomiting.  Genitourinary:  Negative for dysuria and flank pain.  Musculoskeletal:  Negative for back pain and neck pain.  Skin:  Negative for rash.  Neurological:  Negative for headaches.  Hematological:  Does not bruise/bleed easily.  Psychiatric/Behavioral:  Negative for confusion.     Physical Exam Updated Vital Signs BP 137/60   Pulse (!) 103   Temp 99 F (37.2 C)   Resp 20   Ht 1.676 m ('5\' 6"'$ )   Wt 81 kg   SpO2 98%   BMI 28.82 kg/m  Physical Exam Vitals and nursing note reviewed.  Constitutional:      Appearance: Normal appearance.  She is well-developed.  HENT:     Head: Atraumatic.     Nose: Nose normal.     Mouth/Throat:     Mouth: Mucous membranes are moist.  Eyes:     General: No scleral icterus.    Extraocular Movements: Extraocular movements intact.     Conjunctiva/sclera: Conjunctivae normal.     Pupils: Pupils are equal, round, and reactive to light.  Neck:     Vascular: No carotid bruit.     Trachea: No tracheal deviation.  Cardiovascular:     Rate and Rhythm: Normal rate and regular rhythm.     Pulses: Normal pulses.     Heart sounds: Normal heart sounds. No murmur heard.    No friction rub. No gallop.  Pulmonary:     Effort: Pulmonary effort is normal. No respiratory distress.     Breath sounds:  Normal breath sounds.  Abdominal:     General: There is no distension.     Palpations: Abdomen is soft.     Tenderness: There is no abdominal tenderness.  Genitourinary:    Comments: No cva tenderness.  Musculoskeletal:        General: No swelling.     Cervical back: Normal range of motion and neck supple. No rigidity. No muscular tenderness.  Skin:    General: Skin is warm and dry.     Findings: No rash.  Neurological:     Mental Status: She is alert.     Cranial Nerves: No cranial nerve deficit.     Comments: Alert, speech normal. No aphasia noted. Mild, somewhat inconsistent dysarthria noted.  Motor/sens grossly intact bil. Steady gait.   Psychiatric:        Mood and Affect: Mood normal.     ED Results / Procedures / Treatments   Labs (all labs ordered are listed, but only abnormal results are displayed) Results for orders placed or performed during the hospital encounter of 12/30/21  Ethanol  Result Value Ref Range   Alcohol, Ethyl (B) <10 <10 mg/dL  Protime-INR  Result Value Ref Range   Prothrombin Time 13.6 11.4 - 15.2 seconds   INR 1.1 0.8 - 1.2  APTT  Result Value Ref Range   aPTT 39 (H) 24 - 36 seconds  CBC  Result Value Ref Range   WBC 5.4 4.0 - 10.5 K/uL   RBC 4.25 3.87 - 5.11 MIL/uL   Hemoglobin 12.2 12.0 - 15.0 g/dL   HCT 36.2 36.0 - 46.0 %   MCV 85.2 80.0 - 100.0 fL   MCH 28.7 26.0 - 34.0 pg   MCHC 33.7 30.0 - 36.0 g/dL   RDW 12.8 11.5 - 15.5 %   Platelets 133 (L) 150 - 400 K/uL   nRBC 0.0 0.0 - 0.2 %  Differential  Result Value Ref Range   Neutrophils Relative % 68 %   Neutro Abs 3.7 1.7 - 7.7 K/uL   Lymphocytes Relative 20 %   Lymphs Abs 1.1 0.7 - 4.0 K/uL   Monocytes Relative 8 %   Monocytes Absolute 0.4 0.1 - 1.0 K/uL   Eosinophils Relative 3 %   Eosinophils Absolute 0.2 0.0 - 0.5 K/uL   Basophils Relative 1 %   Basophils Absolute 0.0 0.0 - 0.1 K/uL   Immature Granulocytes 0 %   Abs Immature Granulocytes 0.02 0.00 - 0.07 K/uL   Comprehensive metabolic panel  Result Value Ref Range   Sodium 140 135 - 145 mmol/L   Potassium 4.2 3.5 - 5.1  mmol/L   Chloride 101 98 - 111 mmol/L   CO2 28 22 - 32 mmol/L   Glucose, Bld 73 70 - 99 mg/dL   BUN 27 (H) 8 - 23 mg/dL   Creatinine, Ser 1.41 (H) 0.44 - 1.00 mg/dL   Calcium 9.2 8.9 - 10.3 mg/dL   Total Protein 6.3 (L) 6.5 - 8.1 g/dL   Albumin 3.7 3.5 - 5.0 g/dL   AST 21 15 - 41 U/L   ALT 12 0 - 44 U/L   Alkaline Phosphatase 46 38 - 126 U/L   Total Bilirubin 1.6 (H) 0.3 - 1.2 mg/dL   GFR, Estimated 41 (L) >60 mL/min   Anion gap 11 5 - 15  CBG monitoring, ED  Result Value Ref Range   Glucose-Capillary 69 (L) 70 - 99 mg/dL  I-stat chem 8, ED  Result Value Ref Range   Sodium 138 135 - 145 mmol/L   Potassium 4.2 3.5 - 5.1 mmol/L   Chloride 98 98 - 111 mmol/L   BUN 29 (H) 8 - 23 mg/dL   Creatinine, Ser 1.40 (H) 0.44 - 1.00 mg/dL   Glucose, Bld 67 (L) 70 - 99 mg/dL   Calcium, Ion 1.15 1.15 - 1.40 mmol/L   TCO2 30 22 - 32 mmol/L   Hemoglobin 11.9 (L) 12.0 - 15.0 g/dL   HCT 35.0 (L) 36.0 - 46.0 %   CT HEAD CODE STROKE WO CONTRAST  Result Date: 12/30/2021 CLINICAL DATA:  Code stroke.  Left facial droop. EXAM: CT HEAD WITHOUT CONTRAST TECHNIQUE: Contiguous axial images were obtained from the base of the skull through the vertex without intravenous contrast. RADIATION DOSE REDUCTION: This exam was performed according to the departmental dose-optimization program which includes automated exposure control, adjustment of the mA and/or kV according to patient size and/or use of iterative reconstruction technique. COMPARISON:  MR head without contrast 01/04/2021. CT head without contrast 01/03/2021 FINDINGS: Brain: Remote lacunar infarct of the left centrum semi ovale is noted. Remote lacunar infarcts of the right caudate head are also noted. A remote lacunar infarct is again noted within the right corona radiata. Remote lacunar infarcts are present in the cerebellum bilaterally. No  acute cortical infarct or hemorrhage is present. Basal ganglia are otherwise intact. Insular ribbon is normal bilaterally. No acute infarct parenchymal hemorrhage is present. The ventricles are of normal size. No extra-axial collections are slightly larger on the right. Right-sided collection measures up to 5 mm and is of intermediate density. No significant mass effect or midline shift is present. A prominent cortical vein is noted over the right central sulcus. Dural sinuses are within normal limits. Vascular: Dense atherosclerotic calcifications are present within the cavernous internal carotid arteries and at the dural margin both vertebral arteries. No hyperdense vessel is present. Skull: Calvarium is intact. No focal lytic or blastic lesions are present. No significant extracranial soft tissue lesion is present. Sinuses/Orbits: The paranasal sinuses and mastoid air cells are clear. The globes and orbits are within normal limits. ASPECTS [facility] Stroke Program Early CT Score) - Ganglionic level infarction (caudate, lentiform nuclei, internal capsule, insula, M1-M3 cortex): 7/7 - Supraganglionic infarction (M4-M6 cortex): 3/3 Total score (0-10 with 10 being normal): 10/10 IMPRESSION: 1. No definite acute infarct.  Aspects 10/10. 2. New intermediate density extra-axial collections likely represent subacute small subdural hematoma is measuring up to 5 mm on the right. No significant mass effect or midline shift is present. 3. Remote lacunar infarcts involving the left centrum semi ovale, right caudate  head, and right corona radiata. 4. Remote lacunar infarcts of the cerebellum bilaterally. 5. Atherosclerosis. The above was relayed via text pager to [place] on 12/30/2021 at 13:32 . Electronically Signed   By: San Morelle M.D.   On: 12/30/2021 13:32     EKG EKG Interpretation  Date/Time:  Friday December 30 2021 13:39:41 EDT Ventricular Rate:  83 PR Interval:    QRS Duration: 110 QT  Interval:  421 QTC Calculation: 495 R Axis:   74 Text Interpretation: Sinus rhythm Non-specific ST-t changes Confirmed by Lajean Saver [phone]) on 12/30/2021 2:32:51 PM  Radiology CT HEAD CODE STROKE WO CONTRAST  Result Date: 12/30/2021 CLINICAL DATA:  Code stroke.  Left facial droop. EXAM: CT HEAD WITHOUT CONTRAST TECHNIQUE: Contiguous axial images were obtained from the base of the skull through the vertex without intravenous contrast. RADIATION DOSE REDUCTION: This exam was performed according to the departmental dose-optimization program which includes automated exposure control, adjustment of the mA and/or kV according to patient size and/or use of iterative reconstruction technique. COMPARISON:  MR head without contrast 01/04/2021. CT head without contrast 01/03/2021 FINDINGS: Brain: Remote lacunar infarct of the left centrum semi ovale is noted. Remote lacunar infarcts of the right caudate head are also noted. A remote lacunar infarct is again noted within the right corona radiata. Remote lacunar infarcts are present in the cerebellum bilaterally. No acute cortical infarct or hemorrhage is present. Basal ganglia are otherwise intact. Insular ribbon is normal bilaterally. No acute infarct parenchymal hemorrhage is present. The ventricles are of normal size. No extra-axial collections are slightly larger on the right. Right-sided collection measures up to 5 mm and is of intermediate density. No significant mass effect or midline shift is present. A prominent cortical vein is noted over the right central sulcus. Dural sinuses are within normal limits. Vascular: Dense atherosclerotic calcifications are present within the cavernous internal carotid arteries and at the dural margin both vertebral arteries. No hyperdense vessel is present. Skull: Calvarium is intact. No focal lytic or blastic lesions are present. No significant extracranial soft tissue lesion is present. Sinuses/Orbits: The paranasal sinuses  and mastoid air cells are clear. The globes and orbits are within normal limits. ASPECTS [facility] Stroke Program Early CT Score) - Ganglionic level infarction (caudate, lentiform nuclei, internal capsule, insula, M1-M3 cortex): 7/7 - Supraganglionic infarction (M4-M6 cortex): 3/3 Total score (0-10 with 10 being normal): 10/10 IMPRESSION: 1. No definite acute infarct.  Aspects 10/10. 2. New intermediate density extra-axial collections likely represent subacute small subdural hematoma is measuring up to 5 mm on the right. No significant mass effect or midline shift is present. 3. Remote lacunar infarcts involving the left centrum semi ovale, right caudate head, and right corona radiata. 4. Remote lacunar infarcts of the cerebellum bilaterally. 5. Atherosclerosis. The above was relayed via text pager to [place] on 12/30/2021 at 13:32 . Electronically Signed   By: San Morelle M.D.   On: 12/30/2021 13:32    Procedures Procedures    Medications Ordered in ED Medications - No data to display  ED Course/ Medical Decision Making/ A&P                           Medical Decision Making Problems Addressed: Dysarthria: acute illness or injury with systemic symptoms that poses a threat to life or bodily functions Facial numbness: acute illness or injury Subdural hematoma ([place]): chronic illness or injury that poses a threat to life or  bodily functions  Amount and/or Complexity of Data Reviewed Independent Historian: EMS    Details: hx External Data Reviewed: notes. Labs: ordered. Decision-making details documented in ED Course. Radiology: ordered and independent interpretation performed. Decision-making details documented in ED Course. ECG/medicine tests: ordered and independent interpretation performed. Decision-making details documented in ED Course. Discussion of management or test interpretation with external provider(s): Neurology/medicine  Risk Decision regarding  hospitalization.   Iv ns. Continuous pulse ox and cardiac monitoring. Labs ordered/sent. Imaging ordered.   Diff dx includes cva, tia, anxiety/stress rxn, etc - dispo decision including potential need for admission considered - will get labs and imaging and reassess..  Reviewed nursing notes and prior charts for additional history. External reports reviewed. Additional history from:  Cardiac monitor: sinus rhythm, rate 88.  Labs reviewed/interpreted by me - Na, k, wbc, hgb normal.   CT reviewed/interpreted by me - no hem. Small sdh noted, and also noted on prior imaging at [facility] felt to not be acute, with no mass effect.   Neurology consulted.  Discussed pt with Dr Cheral Marker - he recommends medicine admission, they will consult.  Medicine consulted for admission.               Final Clinical Impression(s) / ED Diagnoses Final diagnoses:  None    Rx / DC Orders ED Discharge Orders     None         Lajean Saver, MD 12/30/21 1553

## 2021-12-30 NOTE — Consult Note (Addendum)
NEURO HOSPITALIST CONSULT NOTE   Requestig physician: Dr. Roosevelt Locks  Reason for Consult: Acute onset of left facial droop and dysarthria  History obtained from:  Patient and Chart     HPI:                                                                                                                                          Cynthia Cook is an 66 y.o. female who presents to the ED via EMS with acute onset of left facial droop and slurred speech. LKN was 1200. She initially had noted numbness/tingling sensation on face/around mouth, and then noted that the left side of her face felt funny. She also noticed that some of her words sounded slurred. She has a history of stroke approximately 1 year ago and also was recently discharged from OSH after work up for TIA approximately 6 days ago. MRI obtained during her recent admission at OSH was negative for acute stroke, but did reveal old lacunar infarctions. She was discharged at that time on ASA 81 mg po qd.   Her PMH also includes gout, PE, persistent atrial fibrillation, syncope and thyroid disease.   She denies limb weakness, limb numbness, headache, fall, CP, SOB or syncope. She has recently been experiencing some anxiety/stress.   Past Medical History:  Diagnosis Date   Back pain    Gout    Murmur    PE (pulmonary embolism)    Persistent atrial fibrillation (HCC)    Pulmonary emboli (HCC) 02/05/2020   Syncope    Thyroid disease     History reviewed. No pertinent surgical history.  Family History  Family history unknown: Yes              Social History:  reports that she has never smoked. She has never used smokeless tobacco. She reports that she does not drink alcohol and does not use drugs.  Allergies  Allergen Reactions   Codeine Nausea Only   Fentanyl Nausea And Vomiting    Dizziness, lightheaded     MEDICATIONS:                                                                                                                      Prior to Admission:  Medications Prior to  Admission  Medication Sig Dispense Refill Last Dose   ascorbic acid (VITAMIN C) 500 MG tablet Take 500 mg by mouth daily.   12/30/2021   colchicine 0.6 MG tablet Take 2 tabs (1.'2mg'$ ) at initial sign of gout attack and 1 tab (0.'6mg'$ ) 1 hour later THEN take 1 tab (0.'6mg'$ ) twice a day until gout flair resolves. 25 tablet 3 unknown   levothyroxine (SYNTHROID) 50 MCG tablet Take 1 tablet (50 mcg) by mouth on Monday, Wednesday, and Friday 45 tablet 11 12/30/2021   levothyroxine (SYNTHROID) 75 MCG tablet Take 1 tablet (75 mcg) by mouth on Tuesday, Thursday, Saturday, and Sunday 45 tablet 11 12/29/2021   predniSONE (DELTASONE) 20 MG tablet Take 2 tablets (40 mg total) by mouth daily with breakfast for 2 days, THEN 1 tablet (20 mg total) daily with breakfast for 2 days, THEN 0.5 tablets (10 mg total) daily with breakfast for 4 days. 8 tablet 0 unknown   triamcinolone cream (KENALOG) 0.1 % Apply 1 application. topically daily as needed (rash). 30 g 1 unknown   Vitamin D-Vitamin K (VITAMIN K2-VITAMIN D3 PO) Take 1 tablet by mouth daily.    12/30/2021   vitamin E 180 MG (400 UNITS) capsule Take 400 Units by mouth daily.   12/30/2021   aspirin 81 MG chewable tablet Chew 81 mg by mouth daily.   never started   Scheduled:  [START ON 12/31/2021]  stroke: early stages of recovery book   Does not apply Once   aspirin EC  81 mg Oral Daily   atorvastatin  40 mg Oral Daily   enoxaparin (LOVENOX) injection  40 mg Subcutaneous Q24H   [START ON 01/02/2022] levothyroxine  50 mcg Oral Q M,W,F   [START ON 12/31/2021] levothyroxine  75 mcg Oral Once per day on Sun Tue Thu Sat   Continuous:   ROS:                                                                                                                                       As per HPI.    Blood pressure (!) 150/66, pulse 87, temperature 98.7 F (37.1 C), temperature source Oral, resp. rate 17, height '5\' 6"'$   (1.676 m), weight 81 kg, SpO2 99 %.   General Examination:                                                                                                       Physical Exam  HEENT-  [place]/AT  Lungs-  Respirations unlabored Extremities- No edema  Neurological Examination Mental Status: Awake and alert. Oriented x 5. Speech fluent with intact naming and comprehension. Subtle dysarthria is noted.  Cranial Nerves: II: Visual fields intact bilaterally. No extinction to DSS.   III,IV, VI: No ptosis. EOMI. No nystagmus.  V: Temp sensation equal bilaterally VII: Subtle left facial droop.  VIII: Hearing intact to conversation IX,X: No hoarseness or hypophonia XI: Symmetric XII: Midline tongue extension Motor: Right : Upper extremity   5/5    Left:     Upper extremity   5/5  Lower extremity   5/5     Lower extremity   5/5 No pronator drift Sensory: Temp and light touch intact throughout, bilaterally Deep Tendon Reflexes: 2+ and symmetric throughout Cerebellar: No ataxia with FNF or H-S bilaterally Gait: Deferred    Lab Results: Basic Metabolic Panel: Recent Labs  Lab 12/30/21 1311 12/30/21 1316  NA 140 138  K 4.2 4.2  CL 101 98  CO2 28  --   GLUCOSE 73 67*  BUN 27* 29*  CREATININE 1.41* 1.40*  CALCIUM 9.2  --     CBC: Recent Labs  Lab 12/30/21 1311 12/30/21 1316  WBC 5.4  --   NEUTROABS 3.7  --   HGB 12.2 11.9*  HCT 36.2 35.0*  MCV 85.2  --   PLT 133*  --     Cardiac Enzymes: No results for input(s): "CKTOTAL", "CKMB", "CKMBINDEX", "TROPONINI" in the last 168 hours.  Lipid Panel: No results for input(s): "CHOL", "TRIG", "HDL", "CHOLHDL", "VLDL", "LDLCALC" in the last 168 hours.  Imaging: CT HEAD CODE STROKE WO CONTRAST  Result Date: 12/30/2021 CLINICAL DATA:  Code stroke.  Left facial droop. EXAM: CT HEAD WITHOUT CONTRAST TECHNIQUE: Contiguous axial images were obtained from the base of the skull through the vertex without intravenous contrast. RADIATION DOSE  REDUCTION: This exam was performed according to the departmental dose-optimization program which includes automated exposure control, adjustment of the mA and/or kV according to patient size and/or use of iterative reconstruction technique. COMPARISON:  MR head without contrast 01/04/2021. CT head without contrast 01/03/2021 FINDINGS: Brain: Remote lacunar infarct of the left centrum semi ovale is noted. Remote lacunar infarcts of the right caudate head are also noted. A remote lacunar infarct is again noted within the right corona radiata. Remote lacunar infarcts are present in the cerebellum bilaterally. No acute cortical infarct or hemorrhage is present. Basal ganglia are otherwise intact. Insular ribbon is normal bilaterally. No acute infarct parenchymal hemorrhage is present. The ventricles are of normal size. No extra-axial collections are slightly larger on the right. Right-sided collection measures up to 5 mm and is of intermediate density. No significant mass effect or midline shift is present. A prominent cortical vein is noted over the right central sulcus. Dural sinuses are within normal limits. Vascular: Dense atherosclerotic calcifications are present within the cavernous internal carotid arteries and at the dural margin both vertebral arteries. No hyperdense vessel is present. Skull: Calvarium is intact. No focal lytic or blastic lesions are present. No significant extracranial soft tissue lesion is present. Sinuses/Orbits: The paranasal sinuses and mastoid air cells are clear. The globes and orbits are within normal limits. ASPECTS [facility] Stroke Program Early CT Score) - Ganglionic level infarction (caudate, lentiform nuclei, internal capsule, insula, M1-M3 cortex): 7/7 - Supraganglionic infarction (M4-M6 cortex): 3/3 Total score (0-10 with 10 being normal): 10/10 IMPRESSION: 1. No definite acute infarct.  Aspects 10/10. 2. New intermediate density extra-axial collections likely represent  subacute  small subdural hematoma is measuring up to 5 mm on the right. No significant mass effect or midline shift is present. 3. Remote lacunar infarcts involving the left centrum semi ovale, right caudate head, and right corona radiata. 4. Remote lacunar infarcts of the cerebellum bilaterally. 5. Atherosclerosis. The above was relayed via text pager to [place] on 12/30/2021 at 13:32 . Electronically Signed   By: San Morelle M.D.   On: 12/30/2021 13:32     Assessment: 66 year old female presenting with acute onset of dysarthria and facial droop - Exam reveals subtle dysarthria and minimal left facial droop.  - The patient is not a candidate for IV Thrombolytic due to too mild to treat.  - The patient is not a candidate for IR due to no LVO suspected based on clinical features.  - CT head: No definite acute infarct.  Aspects 10/10. New intermediate density extra-axial collections likely represent subacute small subdural hematoma measuring up to 5 mm on the right. No significant mass effect or midline shift is present. Remote lacunar infarcts involving the left centrum semi ovale, right caudate head, and right corona radiata. Remote lacunar infarcts of the cerebellum bilaterally.   Recommendations: 1. HgbA1c, fasting lipid panel 2. MRI/MRA of the brain without contrast 3. PT consult, OT consult, Speech consult 4. Echocardiogram 5. Statin 6. She has failed ASA monotherapy. However, will hold off on adding Plavix to ASA given probable subacute small subdural hematomas seen on CT. May change this plan if MRI reveals the fluid collections to be hygromas rather than hematomas.  7. Permissive HTN x 24 hours.  8. Telemetry monitoring 9. Frequent neuro checks 10 NPO until passes stroke swallow screen 11. Carotid ultrasound      Electronically signed: Dr. Kerney Elbe  12/30/2021, 7:23 PM

## 2021-12-30 NOTE — ED Triage Notes (Signed)
Pt to ER via EMS from home with c/o left facial droop and slurred speech. Pt to CT neurology and stroke nurse at bedside for assessment. Symptoms are better now, but not resolved.  Pt has hx of stroke 1 year ago and TIA in last month.

## 2021-12-30 NOTE — H&P (Signed)
History and Physical    Cynthia Cook ENI:[id number] DOB: [birth year] DOA: 12/30/2021  PCP: Armanda Heritage, NP (Confirm with patient/family/NH records and if not entered, this has to be entered at [facility] point of entry) Patient coming from: Home  I have personally briefly reviewed patient's old medical records in [place]  Chief Complaint: Lips numb and slurred speech  HPI: Cynthia Cook is a 66 y.o. female with medical history significant of stroke in 2022 with residual dysarthria, PAF not on anticoagulation, remote PE off Coumadin, hypothyroidism, gout, presented with new onset of one-sided face numbness and speech problem.  Symptoms started sometime after lunch, patient suddenly started to feel numbness on left-sided of upper and lower lips and left side of face, soon she developed slurred speech while talking to a friend, which she described as " no cognition problems but has trouble to control tongue and lips" symptoms improved by self after arriving in the ED about 2 hours.  Denies any numbness weakness of any of the limbs, no fall.  Last week, patient had a similar episode when she had a numbness on her left side of her face and slurred speech and went to [facility], MRI negative for acute stroke and patient discharged home with Zio patch.  Patient however is yet to put distill patch on.  Patient also reports that in 2021, she developed palpitations admitted to hospitalist diagnosed with new onset of A-fib and she was discharged on Xarelto.  However, she found out that it caused her $50 a month to continue Xarelto or Eliquis, and she also reported she has poor tolerance of Coumadin in the past.  ED Course: SBP in the range of 150-160s, no tachycardia no A-fib on telemetry monitor.  CT head showed remote stroke and subacute epidural hematoma versus hygroma on the MRI on recent admission at [facility].  Review of Systems: As per HPI otherwise 14 point review of systems negative.     Past Medical History:  Diagnosis Date   Back pain    Gout    Murmur    PE (pulmonary embolism)    Persistent atrial fibrillation (HCC)    Pulmonary emboli (HCC) 02/05/2020   Syncope    Thyroid disease     History reviewed. No pertinent surgical history.   reports that she has never smoked. She has never used smokeless tobacco. She reports that she does not drink alcohol and does not use drugs.  Allergies  Allergen Reactions   Codeine Nausea Only   Fentanyl Nausea And Vomiting    Dizziness, lightheaded     Family History  Family history unknown: Yes     Prior to Admission medications   Medication Sig Start Date End Date Taking? Authorizing Provider  ascorbic acid (VITAMIN C) 500 MG tablet Take 500 mg by mouth daily.    [provider]  colchicine 0.6 MG tablet Take 2 tabs (1.'2mg'$ ) at initial sign of gout attack and 1 tab (0.'6mg'$ ) 1 hour later THEN take 1 tab (0.'6mg'$ ) twice a day until gout flair resolves. 03/16/21   Orma Render, NP  levothyroxine (SYNTHROID) 50 MCG tablet Take 1 tablet (50 mcg) by mouth on Monday, Wednesday, and Friday 06/28/21   Early, Coralee Pesa, NP  levothyroxine (SYNTHROID) 75 MCG tablet Take 1 tablet (75 mcg) by mouth on Tuesday, Thursday, Saturday, and Sunday 06/28/21   Early, Coralee Pesa, NP  predniSONE (DELTASONE) 20 MG tablet Take 2 tablets (40 mg total) by mouth daily with breakfast  for 2 days, THEN 1 tablet (20 mg total) daily with breakfast for 2 days, THEN 0.5 tablets (10 mg total) daily with breakfast for 4 days. 11/01/21   [place], Blondell Reveal, MD  triamcinolone cream (KENALOG) 0.1 % Apply 1 application. topically daily as needed (rash). 06/28/21   Orma Render, NP  Vitamin D-Vitamin K (VITAMIN K2-VITAMIN D3 PO) Take 1 tablet by mouth daily.     [provider]  vitamin E 180 MG (400 UNITS) capsule Take 400 Units by mouth daily.    [provider]    Physical Exam: Vitals:   12/30/21 1515 12/30/21 1530 12/30/21 1545 12/30/21  1600  BP: (!) 150/97 (!) 168/73 (!) 172/98 (!) 159/88  Pulse: 78 80 88 80  Resp: (!) '24 19 17 '$ (!) 22  Temp:      SpO2: 100% 100% 100% 99%  Weight:      Height:        Constitutional: NAD, calm, comfortable Vitals:   12/30/21 1515 12/30/21 1530 12/30/21 1545 12/30/21 1600  BP: (!) 150/97 (!) 168/73 (!) 172/98 (!) 159/88  Pulse: 78 80 88 80  Resp: (!) '24 19 17 '$ (!) 22  Temp:      SpO2: 100% 100% 100% 99%  Weight:      Height:       Eyes: PERRL, lids and conjunctivae normal ENMT: Mucous membranes are moist. Posterior pharynx clear of any exudate or lesions.Normal dentition.  Neck: normal, supple, no masses, no thyromegaly Respiratory: clear to auscultation bilaterally, no wheezing, no crackles. Normal respiratory effort. No accessory muscle use.  Cardiovascular: Regular rate and rhythm, no murmurs / rubs / gallops. No extremity edema. 2+ pedal pulses. No carotid bruits.  Abdomen: no tenderness, no masses palpated. No hepatosplenomegaly. Bowel sounds positive.  Musculoskeletal: no clubbing / cyanosis. No joint deformity upper and lower extremities. Good ROM, no contractures. Normal muscle tone.  Skin: no rashes, lesions, ulcers. No induration Neurologic: CN 2-12 grossly intact. Sensation intact, DTR normal. Strength 5/5 in all 4.  Psychiatric: Normal judgment and insight. Alert and oriented x 3. Normal mood.    Labs on Admission: I have personally reviewed following labs and imaging studies  CBC: Recent Labs  Lab 12/30/21 1311 12/30/21 1316  WBC 5.4  --   NEUTROABS 3.7  --   HGB 12.2 11.9*  HCT 36.2 35.0*  MCV 85.2  --   PLT 133*  --    Basic Metabolic Panel: Recent Labs  Lab 12/30/21 1311 12/30/21 1316  NA 140 138  K 4.2 4.2  CL 101 98  CO2 28  --   GLUCOSE 73 67*  BUN 27* 29*  CREATININE 1.41* 1.40*  CALCIUM 9.2  --    GFR: Estimated Creatinine Clearance: 41.3 mL/min (A) (by C-G formula based on SCr of 1.4 mg/dL (H)). Liver Function Tests: Recent Labs   Lab 12/30/21 1311  AST 21  ALT 12  ALKPHOS 46  BILITOT 1.6*  PROT 6.3*  ALBUMIN 3.7   No results for input(s): "LIPASE", "AMYLASE" in the last 168 hours. No results for input(s): "AMMONIA" in the last 168 hours. Coagulation Profile: Recent Labs  Lab 12/30/21 1311  INR 1.1   Cardiac Enzymes: No results for input(s): "CKTOTAL", "CKMB", "CKMBINDEX", "TROPONINI" in the last 168 hours. BNP (last 3 results) No results for input(s): "PROBNP" in the last 8760 hours. HbA1C: No results for input(s): "HGBA1C" in the last 72 hours. CBG: Recent Labs  Lab 12/30/21 [place]  69*   Lipid Profile: No results for input(s): "CHOL", "HDL", "LDLCALC", "TRIG", "CHOLHDL", "LDLDIRECT" in the last 72 hours. Thyroid Function Tests: No results for input(s): "TSH", "T4TOTAL", "FREET4", "T3FREE", "THYROIDAB" in the last 72 hours. Anemia Panel: No results for input(s): "VITAMINB12", "FOLATE", "FERRITIN", "TIBC", "IRON", "RETICCTPCT" in the last 72 hours. Urine analysis:    Component Value Date/Time   COLORURINE YELLOW 02/27/2020 0210   APPEARANCEUR CLEAR 02/27/2020 0210   LABSPEC 1.018 02/27/2020 0210   PHURINE 5.0 02/27/2020 0210   GLUCOSEU NEGATIVE 02/27/2020 0210   HGBUR NEGATIVE 02/27/2020 0210   BILIRUBINUR NEGATIVE 02/27/2020 0210   KETONESUR NEGATIVE 02/27/2020 0210   PROTEINUR 30 (A) 02/27/2020 0210   NITRITE NEGATIVE 02/27/2020 0210   LEUKOCYTESUR NEGATIVE 02/27/2020 0210    Radiological Exams on Admission: CT HEAD CODE STROKE WO CONTRAST  Result Date: 12/30/2021 CLINICAL DATA:  Code stroke.  Left facial droop. EXAM: CT HEAD WITHOUT CONTRAST TECHNIQUE: Contiguous axial images were obtained from the base of the skull through the vertex without intravenous contrast. RADIATION DOSE REDUCTION: This exam was performed according to the departmental dose-optimization program which includes automated exposure control, adjustment of the mA and/or kV according to patient size and/or use  of iterative reconstruction technique. COMPARISON:  MR head without contrast 01/04/2021. CT head without contrast 01/03/2021 FINDINGS: Brain: Remote lacunar infarct of the left centrum semi ovale is noted. Remote lacunar infarcts of the right caudate head are also noted. A remote lacunar infarct is again noted within the right corona radiata. Remote lacunar infarcts are present in the cerebellum bilaterally. No acute cortical infarct or hemorrhage is present. Basal ganglia are otherwise intact. Insular ribbon is normal bilaterally. No acute infarct parenchymal hemorrhage is present. The ventricles are of normal size. No extra-axial collections are slightly larger on the right. Right-sided collection measures up to 5 mm and is of intermediate density. No significant mass effect or midline shift is present. A prominent cortical vein is noted over the right central sulcus. Dural sinuses are within normal limits. Vascular: Dense atherosclerotic calcifications are present within the cavernous internal carotid arteries and at the dural margin both vertebral arteries. No hyperdense vessel is present. Skull: Calvarium is intact. No focal lytic or blastic lesions are present. No significant extracranial soft tissue lesion is present. Sinuses/Orbits: The paranasal sinuses and mastoid air cells are clear. The globes and orbits are within normal limits. ASPECTS [facility] Stroke Program Early CT Score) - Ganglionic level infarction (caudate, lentiform nuclei, internal capsule, insula, M1-M3 cortex): 7/7 - Supraganglionic infarction (M4-M6 cortex): 3/3 Total score (0-10 with 10 being normal): 10/10 IMPRESSION: 1. No definite acute infarct.  Aspects 10/10. 2. New intermediate density extra-axial collections likely represent subacute small subdural hematoma is measuring up to 5 mm on the right. No significant mass effect or midline shift is present. 3. Remote lacunar infarcts involving the left centrum semi ovale, right caudate  head, and right corona radiata. 4. Remote lacunar infarcts of the cerebellum bilaterally. 5. Atherosclerosis. The above was relayed via text pager to [place] on 12/30/2021 at 13:32 . Electronically Signed   By: San Morelle M.D.   On: 12/30/2021 13:32    EKG: Independently reviewed.  Sinus arrhythmia, computer reported as A-fib but clearly there is a P wave before each QRS.  Assessment/Plan Principal Problem:   CVA (cerebral vascular accident) [facility]) Active Problems:   Acute CVA (cerebrovascular accident) ([place])  (please populate well all problems here in Problem List. (For example, if patient is on  BP meds at home and you resume or decide to hold them, it is a problem that needs to be her. Same for CAD, COPD, HLD and so on)  TIA -Acute dysarthria and paresthesia on left face -Risk factor PAF not on anticoagulation due to financial issue.  Consult case management. -LDL= 146 last week, start atorvastatin 40 mg -A1c 5.3 -Blood pressure appears to be elevated but unsure about her baseline.  Allow permissive hypertension for 2 days then consider discharge home with BP meds. -Ordered brain MRI and carotid Doppler as per recommended by neurology for now.  Echocardiogram was done last week, and which showed no significantly, will not repeat at this point.  PAF -Incidental recent, not coherent with Zio patch from [facility] hospitalization -Educated patient to start Zio patch at sinus discharge home this time, patient agreed. -Consult case management for financial aid stability to cover her anticoagulation cost.  Elevated blood pressure -As above  Hypothyroidism -Check TSH -Continue Synthroid  CKD stage II -Euvolemic, creatinine stable  DVT prophylaxis: Lovenox Code Status: Full code Family Communication: None at bedside Disposition Plan: Expect less than 2 midnight hospital stay Consults called: Neurology Admission status: Telemetry observation   Lequita Halt MD [facility] Pager [phone]  12/30/2021, 4:20 PM

## 2021-12-30 NOTE — ED Notes (Signed)
Pt refuses COVID swab.  States she had one 5 days ago and that she does not have any symptoms.

## 2021-12-31 ENCOUNTER — Observation Stay (HOSPITAL_BASED_OUTPATIENT_CLINIC_OR_DEPARTMENT_OTHER): Payer: Medicare Other

## 2021-12-31 DIAGNOSIS — G459 Transient cerebral ischemic attack, unspecified: Secondary | ICD-10-CM | POA: Diagnosis not present

## 2021-12-31 DIAGNOSIS — I480 Paroxysmal atrial fibrillation: Secondary | ICD-10-CM

## 2021-12-31 DIAGNOSIS — I639 Cerebral infarction, unspecified: Secondary | ICD-10-CM

## 2021-12-31 DIAGNOSIS — E039 Hypothyroidism, unspecified: Secondary | ICD-10-CM

## 2021-12-31 DIAGNOSIS — I6389 Other cerebral infarction: Secondary | ICD-10-CM | POA: Diagnosis not present

## 2021-12-31 DIAGNOSIS — S065XAA Traumatic subdural hemorrhage with loss of consciousness status unknown, initial encounter: Secondary | ICD-10-CM | POA: Diagnosis not present

## 2021-12-31 LAB — LIPID PANEL
Cholesterol: 187 mg/dL (ref 0–200)
HDL: 45 mg/dL (ref >40)
LDL Cholesterol: 127 mg/dL — ABNORMAL HIGH (ref 0–99)
Total CHOL/HDL Ratio: 4.2 RATIO
Triglycerides: 73 mg/dL (ref <150)
VLDL: 15 mg/dL (ref 0–40)

## 2021-12-31 LAB — ECHOCARDIOGRAM COMPLETE
AR max vel: 1.76 cm2
AV Area VTI: 1.74 cm2
AV Area mean vel: 1.6 cm2
AV Mean grad: 11.5 mmHg
AV Peak grad: 18.8 mmHg
AV Vena cont: 0.3 cm
Ao pk vel: 2.17 m/s
Area-P 1/2: 3.31 cm2
Calc EF: 47.3 %
Height: 66 in
MV M vel: 5.58 m/s
MV Peak grad: 124.5 mmHg
MV VTI: 1.37 cm2
P 1/2 time: 309 msec
Radius: 0.5 cm
S' Lateral: 3.4 cm
Single Plane A2C EF: 41.6 %
Single Plane A4C EF: 49.9 %
Weight: 2857.16 oz

## 2021-12-31 LAB — HIV ANTIBODY (ROUTINE TESTING W REFLEX): HIV Screen 4th Generation wRfx: NONREACTIVE

## 2021-12-31 LAB — TSH: TSH: 3.386 u[IU]/mL (ref 0.350–4.500)

## 2021-12-31 MED ORDER — CLOPIDOGREL BISULFATE 75 MG PO TABS
75.0000 mg | ORAL_TABLET | Freq: Every day | ORAL | Status: DC
  Administered 2021-12-31: 75 mg via ORAL
  Filled 2021-12-31 (×2): qty 1

## 2021-12-31 MED ORDER — EZETIMIBE 10 MG PO TABS: 10.0000 mg | ORAL_TABLET | Freq: Every day | ORAL | 2 refills | Status: DC

## 2021-12-31 MED ORDER — CLOPIDOGREL BISULFATE 75 MG PO TABS: 75.0000 mg | ORAL_TABLET | Freq: Every day | ORAL | 0 refills | Status: DC

## 2021-12-31 NOTE — Progress Notes (Signed)
Patient discharged home per MD orders. AVS education provided to patient, including follow up appointments and discharge medications. Patient verbalized understanding and denied any further needs.

## 2021-12-31 NOTE — Progress Notes (Signed)
VASCULAR LAB    TCD has been performed.  See CV proc for preliminary results.   Denijah Karrer, RVT 12/31/2021, 12:09 PM

## 2021-12-31 NOTE — Progress Notes (Signed)
VASCULAR LAB    Carotid duplex has been performed.  See CV proc for preliminary results.   Deepa Barthel, RVT 12/31/2021, 11:07 AM

## 2021-12-31 NOTE — Discharge Planning (Signed)
PATIENT DETAILS Name: Cynthia Cook Age: 66 y.o. Sex: female Date of Birth: [birth year] MRN: [id number]. Admitting Physician: Lequita Halt, MD JJK:[id number], Sonia Side, NP  Admit Date: 12/30/2021 Discharge date: 12/31/2021  Recommendations for Outpatient Follow-up:  Follow up with PCP in 1-2 weeks Please obtain CMP/CBC in one week Please continue risks vs benefits discussion-re anticoagulation-see below  Admitted From:  Home  Disposition: Home   Discharge Condition: fair  CODE STATUS:   Code Status: Full Code   Diet recommendation:  Diet Order             Diet - low sodium heart healthy           Diet Heart Room service appropriate? Yes; Fluid consistency: Thin  Diet effective now                    Brief Summary: Patient is a 66 y.o.  female PAF, CVA-not on anticoagulation-presented with dysarthria/left facial droop-found to have acute CVA.  See below for further details.   Significant events: 9/16>> evaluated at [facility] ED-possible TIA.  MRI-no acute CVA.  Echo EF 55-60%.  Discharged on aspirin/statin. 9/22>> admitted [facility]-acute CVA.     Significant studies: 7/03>>a1c:5.2 9/22>> MRI brain: Small infarct involving right frontal corona radiator.  Small bilateral subdural collections-likely subacute hematomas. 9/23>> LDL 127 9/23>>Carotid doppler:1-39% stenosis bilaterally 9/23>>Echo:EF 55-60%   Significant microbiology data: None   Procedures: None   Consults: Neurology  Brief Hospital Course: Acute CVA: per neurology this is 2/2 to small disease, Work up as above. Discussed with Dr Leonie Man, continue ASA/Plavix for 3 weeks, followed by ASA alone. Apparently patient was not taking ASA at home. She refused to take statins-as she has not tolerated them well, will try Zetia instead.Apart from Left facial drop and mild dysarthria no other deficits   Small bilateral subdural hematomas: Seen on MRI-per Neuro-this is small and likely chronic  Paroxysmal atrial  fibrillation: Maintaining sinus rhythm-initially claimed that she could not afford DOAC due to cost, but upon further discussion, she also has significant concerns regarding side effects (bruising/ecchymosis)-and also that DOACs level could not be monitored and could not be reversed.  Per history-she previously has been intolerant to Coumadin-"they could never get my levels therapeutic".  Appears that the patient has done some research and does not want to go on anticoagulation.  She is aware that she will likely have recurrent CVAs-that could be life-threatening/life disabling.    Hypothyroidism: Continue current dose of Synthroid-repeat TSH in 6 to 8 weeks as minimally elevated.    HLD: Per nursing staff-has refused statin-she apparently is intolerant.  We will try Zetia.   CKD stage IIIa: At baseline   BMI: Estimated body mass index is 28.82 kg/m as calculated from the following:   Height as of this encounter: '5\' 6"'$  (1.676 m).   Weight as of this encounter: 81 kg.      Discharge Diagnoses:  Principal Problem:   CVA (cerebral vascular accident) [facility]) Active Problems:   Acute CVA (cerebrovascular accident) [facility])   Discharge Instructions:  Activity:  As tolerated   Discharge Instructions     Call MD for:  extreme fatigue   Complete by: As directed    Call MD for:  persistant dizziness or light-headedness   Complete by: As directed    Diet - low sodium heart healthy   Complete by: As directed    Discharge instructions   Complete by: As directed    Follow with Primary  MD  Armanda Heritage, NP in 1-2 weeks  Follow with your cardiologist in 1-2 weeks  Take Aspirin Plavix for 3 weeks, and then aspirin alone  Please get a complete blood count and chemistry panel checked by your Primary MD at your next visit, and again as instructed by your Primary MD.  Get Medicines reviewed and adjusted: Please take all your medications with you for your next visit with your Primary  MD  Laboratory/radiological data: Please request your Primary MD to go over all hospital tests and procedure/radiological results at the follow up, please ask your Primary MD to get all Hospital records sent to his/her office.  In some cases, they will be blood work, cultures and biopsy results pending at the time of your discharge. Please request that your primary care M.D. follows up on these results.  Also Note the following: If you experience worsening of your admission symptoms, develop shortness of breath, life threatening emergency, suicidal or homicidal thoughts you must seek medical attention immediately by calling 911 or calling your MD immediately  if symptoms less severe.  You must read complete instructions/literature along with all the possible adverse reactions/side effects for all the Medicines you take and that have been prescribed to you. Take any new Medicines after you have completely understood and accpet all the possible adverse reactions/side effects.   Do not drive when taking Pain medications or sleeping medications (Benzodaizepines)  Do not take more than prescribed Pain, Sleep and Anxiety Medications. It is not advisable to combine anxiety,sleep and pain medications without talking with your primary care practitioner  Special Instructions: If you have smoked or chewed Tobacco  in the last 2 yrs please stop smoking, stop any regular Alcohol  and or any Recreational drug use.  Wear Seat belts while driving.  Please note: You were cared for by a hospitalist during your hospital stay. Once you are discharged, your primary care physician will handle any further medical issues. Please note that NO REFILLS for any discharge medications will be authorized once you are discharged, as it is imperative that you return to your primary care physician (or establish a relationship with a primary care physician if you do not have one) for your post hospital discharge needs so that  they can reassess your need for medications and monitor your lab values.   Increase activity slowly   Complete by: As directed       Allergies as of 12/31/2021       Reactions   Codeine Nausea Only   Fentanyl Nausea And Vomiting   Dizziness, lightheaded         Medication List     TAKE these medications    ascorbic acid 500 MG tablet Commonly known as: VITAMIN C Take 500 mg by mouth daily.   aspirin 81 MG chewable tablet Chew 81 mg by mouth daily.   clopidogrel 75 MG tablet Commonly known as: PLAVIX Take 1 tablet (75 mg total) by mouth daily. Take along with aspirin for 3 weeks, following which stop plavix and continue Aspirin Start taking on: January 01, 2022   colchicine 0.6 MG tablet Take 2 tabs (1.'2mg'$ ) at initial sign of gout attack and 1 tab (0.'6mg'$ ) 1 hour later THEN take 1 tab (0.'6mg'$ ) twice a day until gout flair resolves.   ezetimibe 10 MG tablet Commonly known as: Zetia Take 1 tablet (10 mg total) by mouth daily.   levothyroxine 50 MCG tablet Commonly known as: SYNTHROID Take 1 tablet (50 mcg)  by mouth on Monday, Wednesday, and Friday   levothyroxine 75 MCG tablet Commonly known as: SYNTHROID Take 1 tablet (75 mcg) by mouth on Tuesday, Thursday, Saturday, and 'Sunday   predniSONE 20 MG tablet Commonly known as: DELTASONE Take 2 tablets (40 mg total) by mouth daily with breakfast for 2 days, THEN 1 tablet (20 mg total) daily with breakfast for 2 days, THEN 0.5 tablets (10 mg total) daily with breakfast for 4 days.   triamcinolone cream 0.1 % Commonly known as: KENALOG Apply 1 application. topically daily as needed (rash).   vitamin E 180 MG (400 UNITS) capsule Take 400 Units by mouth daily.   VITAMIN K2-VITAMIN D3 PO Take 1 tablet by mouth daily.        Follow-up Information     Edwards, Jerry, NP. Schedule an appointment as soon as possible for a visit in 1 week(s).   Specialty: Nurse Practitioner Contact information: [address] [place] [address] [phone]         Christopher, Bridgette, MD Follow up in 1 week(s).   Specialty: Cardiology Contact information: [address] [place] [address] [phone]                Allergies  Allergen Reactions   Codeine Nausea Only   Fentanyl Nausea And Vomiting    Dizziness, lightheaded      Other Procedures/Studies: VAS US TRANSCRANIAL DOPPLER  Result Date: 12/31/2021  Transcranial Doppler Patient Name:  Cynthia Cook  Date of Exam:   12/31/2021 Medical Rec #: [id number]       Accession #:    [id number] Date of Birth: 01/11/1956        Patient Gender: F Patient Age:   66 years Exam Location:  [facility] Procedure:      VAS US TRANSCRANIAL DOPPLER Referring Phys: DEVON SHAFER --------------------------------------------------------------------------------  Indications: Stroke. History: Atrial fibrillation, HTN, HLD, Not on anticoagulation. Limitations: Poor windows Limitations for diagnostic windows: Unable to insonate right transtemporal window. Comparison Study: No prior study on file Performing Technologist: Candace Kanady RVS  Examination Guidelines: A complete evaluation includes B-mode imaging, spectral Doppler, color Doppler, and power Doppler as needed of all accessible portions of each vessel. Bilateral testing is considered an integral part of a complete examination. Limited examinations for reoccurring indications may be performed as noted.  +----------+-------------+----------+-----------+------------------+ RIGHT TCD Right VM (cm)Depth (cm)Pulsatility     Comment       +----------+-------------+----------+-----------+------------------+ MCA           32'$ .00                 1.87                       +----------+-------------+----------+-----------+------------------+ ACA                                         unable to insonate +----------+-------------+----------+-----------+------------------+ Term  ICA                                    unable to insonate +----------+-------------+----------+-----------+------------------+ PCA  unable to insonate +----------+-------------+----------+-----------+------------------+ Opthalmic     46.00                 1.94                       +----------+-------------+----------+-----------+------------------+ ICA siphon                                  unable to insonate +----------+-------------+----------+-----------+------------------+ Vertebral     35.40                 1.27                       +----------+-------------+----------+-----------+------------------+  +----------+------------+----------+-----------+------------------+ LEFT TCD  Left VM (cm)Depth (cm)Pulsatility     Comment       +----------+------------+----------+-----------+------------------+ MCA          26.80                 1.54                       +----------+------------+----------+-----------+------------------+ ACA                                        unable to insonate +----------+------------+----------+-----------+------------------+ Term ICA     22.50                 0.97                       +----------+------------+----------+-----------+------------------+ PCA                                        unable to insonate +----------+------------+----------+-----------+------------------+ Opthalmic    43.00                 1.31                       +----------+------------+----------+-----------+------------------+ ICA siphon                                 unable to insonate +----------+------------+----------+-----------+------------------+ Vertebral    -83.00                2.03                       +----------+------------+----------+-----------+------------------+  +------------+------+-------+             VM cm Comment +------------+------+-------+ Dist  Basilar-47.60 1.48   +------------+------+-------+    Preliminary    VAS US CAROTID  Result Date: 12/31/2021 Carotid Arterial Duplex Study Patient Name:  Cynthia Cook  Date of Exam:   12/31/2021 Medical Rec #: [id number]       Accession #:    [id number] Date of Birth: 06/08/1955        Patient Gender: F Patient Age:   39 years Exam Location:  [facility] Procedure:      VAS US CAROTID Referring Phys: Wynetta Fines --------------------------------------------------------------------------------  Indication: CVA Risk Factors:      Hypertension, hyperlipidemia. Other Factors:     Paroxysmal atrial  fibrillation, not on anticoagulation. Comparison Study:  Prior carotid duplex indicating bilateral 1-39% ICA stenosis                    done 01/04/21 Performing Technologist: Sharion Dove RVS  Examination Guidelines: A complete evaluation includes B-mode imaging, spectral Doppler, color Doppler, and power Doppler as needed of all accessible portions of each vessel. Bilateral testing is considered an integral part of a complete examination. Limited examinations for reoccurring indications may be performed as noted.  Right Carotid Findings: +----------+--------+--------+--------+------------------+--------+           PSV cm/sEDV cm/sStenosisPlaque DescriptionComments +----------+--------+--------+--------+------------------+--------+ CCA Prox  93      14                                         +----------+--------+--------+--------+------------------+--------+ CCA Distal101     14              calcific                   +----------+--------+--------+--------+------------------+--------+ ICA Prox  195     33      1-39%   heterogenous               +----------+--------+--------+--------+------------------+--------+ ICA Mid   179     29                                         +----------+--------+--------+--------+------------------+--------+ ICA Distal123     22                                          +----------+--------+--------+--------+------------------+--------+ ECA       106     11                                         +----------+--------+--------+--------+------------------+--------+ +----------+--------+-------+--------+-------------------+           PSV cm/sEDV cmsDescribeArm Pressure (mmHG) +----------+--------+-------+--------+-------------------+ [id number]                                        +----------+--------+-------+--------+-------------------+ +---------+--------+--+--------+--+ VertebralPSV cm/s54EDV cm/s10 +---------+--------+--+--------+--+  Left Carotid Findings: +----------+--------+--------+--------+------------------+--------+           PSV cm/sEDV cm/sStenosisPlaque DescriptionComments +----------+--------+--------+--------+------------------+--------+ CCA Prox  85      9               calcific                   +----------+--------+--------+--------+------------------+--------+ CCA Distal88      13              heterogenous               +----------+--------+--------+--------+------------------+--------+ ICA Prox  78      13      1-39%   heterogenous               +----------+--------+--------+--------+------------------+--------+ ICA Mid   97      15                                         +----------+--------+--------+--------+------------------+--------+  ICA Distal82      22                                tortuous +----------+--------+--------+--------+------------------+--------+ ECA       86      5                                          +----------+--------+--------+--------+------------------+--------+ +----------+--------+--------+--------+-------------------+           PSV cm/sEDV cm/sDescribeArm Pressure (mmHG) +----------+--------+--------+--------+-------------------+ [id number]                                           +----------+--------+--------+--------+-------------------+ +---------+--------+--+--------+--+ VertebralPSV cm/s98EDV cm/s28 +---------+--------+--+--------+--+   Summary: Right Carotid: Velocities in the right ICA are consistent with a 1-39% stenosis. Left Carotid: Velocities in the left ICA are consistent with a 1-39% stenosis. Vertebrals:  Bilateral vertebral arteries demonstrate antegrade flow. Subclavians: Normal flow hemodynamics were seen in bilateral subclavian              arteries. *See table(s) above for measurements and observations.  Electronically signed by Antony Contras MD on 12/31/2021 at 12:44:04 PM.    Final    ECHOCARDIOGRAM COMPLETE  Result Date: 12/31/2021    ECHOCARDIOGRAM REPORT   Patient Name:   Cynthia Cook Date of Exam: 12/31/2021 Medical Rec #:  [id number]      Height:       66.0 in Accession #:    [id number]     Weight:       178.6 lb Date of Birth:  1956/01/30       BSA:          1.906 m Patient Age:    5 years       BP:           125/79 mmHg Patient Gender: F              HR:           67 bpm. Exam Location:  Inpatient Procedure: 2D Echo, Cardiac Doppler and Color Doppler Indications:    Stroke I63.9  History:        Patient has prior history of Echocardiogram examinations, most                 recent 01/04/2021. Arrythmias:Atrial Fibrillation.  Sonographer:    Bernadene Person RDCS Referring Phys: [id number] [place]  1. Left ventricular ejection fraction, by estimation, is 55 to 60%. The left ventricle has normal function. The left ventricle has no regional wall motion abnormalities. There is mild left ventricular hypertrophy. Left ventricular diastolic parameters are indeterminate.  2. Right ventricular systolic function is normal. The right ventricular size is normal. There is normal pulmonary artery systolic pressure.  3. Left atrial size was moderately dilated.  4. Right atrial size was moderately dilated.  5. Severe bulky posterior annular calcification mean  diastolic gradient 5 mmHg but MVA estimated at > 2 cm2 . The mitral valve is degenerative. Moderate mitral valve regurgitation. No evidence of mitral stenosis. Severe mitral annular calcification.  6. The aortic valve is tricuspid. There is moderate calcification of the aortic valve. There is moderate thickening of the aortic valve. Aortic valve regurgitation is mild.  Mild aortic valve stenosis.  7. Aortic dilatation noted. There is moderate dilatation of the ascending aorta, measuring 42 mm.  8. The inferior vena cava is dilated in size with <50% respiratory variability, suggesting right atrial pressure of 15 mmHg. FINDINGS  Left Ventricle: Left ventricular ejection fraction, by estimation, is 55 to 60%. The left ventricle has normal function. The left ventricle has no regional wall motion abnormalities. The left ventricular internal cavity size was normal in size. There is  mild left ventricular hypertrophy. Left ventricular diastolic parameters are indeterminate. Right Ventricle: The right ventricular size is normal. No increase in right ventricular wall thickness. Right ventricular systolic function is normal. There is normal pulmonary artery systolic pressure. The tricuspid regurgitant velocity is 2.39 m/s, and  with an assumed right atrial pressure of 8 mmHg, the estimated right ventricular systolic pressure is 74.1 mmHg. Left Atrium: Left atrial size was moderately dilated. Right Atrium: Right atrial size was moderately dilated. Pericardium: There is no evidence of pericardial effusion. Mitral Valve: Severe bulky posterior annular calcification mean diastolic gradient 5 mmHg but MVA estimated at > 2 cm2. The mitral valve is degenerative in appearance. There is severe thickening of the mitral valve leaflet(s). There is severe calcification of the mitral valve leaflet(s). Severe mitral annular calcification. Moderate mitral valve regurgitation. No evidence of mitral valve stenosis. MV peak gradient, 15.5 mmHg.  The mean mitral valve gradient is 5.0 mmHg. Tricuspid Valve: The tricuspid valve is normal in structure. Tricuspid valve regurgitation is mild . No evidence of tricuspid stenosis. Aortic Valve: The aortic valve is tricuspid. There is moderate calcification of the aortic valve. There is moderate thickening of the aortic valve. Aortic valve regurgitation is mild. Aortic regurgitation PHT measures 309 msec. Mild aortic stenosis is present. Aortic valve mean gradient measures 11.5 mmHg. Aortic valve peak gradient measures 18.8 mmHg. Aortic valve area, by VTI measures 1.74 cm. Pulmonic Valve: The pulmonic valve was normal in structure. Pulmonic valve regurgitation is mild. No evidence of pulmonic stenosis. Aorta: Aortic dilatation noted. There is moderate dilatation of the ascending aorta, measuring 42 mm. Venous: The inferior vena cava is dilated in size with less than 50% respiratory variability, suggesting right atrial pressure of 15 mmHg. IAS/Shunts: No atrial level shunt detected by color flow Doppler.  LEFT VENTRICLE PLAX 2D LVIDd:         4.00 cm      Diastology LVIDs:         3.40 cm      LV e' medial:    2.53 cm/s LV PW:         1.60 cm      LV E/e' medial:  77.1 LV IVS:        1.20 cm      LV e' lateral:   5.96 cm/s LVOT diam:     2.10 cm      LV E/e' lateral: 32.7 LV SV:         84 LV SV Index:   44 LVOT Area:     3.46 cm  LV Volumes (MOD) LV vol d, MOD A2C: 141.0 ml LV vol d, MOD A4C: 125.0 ml LV vol s, MOD A2C: 82.3 ml LV vol s, MOD A4C: 62.6 ml LV SV MOD A2C:     58.7 ml LV SV MOD A4C:     125.0 ml LV SV MOD BP:      64.7 ml RIGHT VENTRICLE RV S prime:     14.70 cm/s TAPSE (M-mode):  2.2 cm LEFT ATRIUM             Index        RIGHT ATRIUM           Index LA diam:        4.00 cm 2.10 cm/m   RA Area:     23.90 cm LA Vol (A2C):   74.6 ml 39.14 ml/m  RA Volume:   78.10 ml  40.98 ml/m LA Vol (A4C):   78.1 ml 40.98 ml/m LA Biplane Vol: 78.6 ml 41.24 ml/m  AORTIC VALVE AV Area (Vmax):    1.76 cm AV Area  (Vmean):   1.60 cm AV Area (VTI):     1.74 cm AV Vmax:           217.00 cm/s AV Vmean:          158.500 cm/s AV VTI:            0.486 m AV Peak Grad:      18.8 mmHg AV Mean Grad:      11.5 mmHg LVOT Vmax:         110.00 cm/s LVOT Vmean:        73.200 cm/s LVOT VTI:          0.244 m LVOT/AV VTI ratio: 0.50 AI PHT:            309 msec AR Vena Contracta: 0.30 cm  AORTA Ao Root diam: 3.50 cm Ao Asc diam:  4.20 cm MITRAL VALVE                  TRICUSPID VALVE MV Area (PHT): 3.31 cm       TR Peak grad:   22.8 mmHg MV Area VTI:   1.37 cm       TR Vmax:        239.00 cm/s MV Peak grad:  15.5 mmHg MV Mean grad:  5.0 mmHg       SHUNTS MV Vmax:       1.97 m/s       Systemic VTI:  0.24 m MV Vmean:      98.7 cm/s      Systemic Diam: 2.10 cm MV Decel Time: 229 msec MR Peak grad:    124.5 mmHg MR Mean grad:    77.0 mmHg MR Vmax:         558.00 cm/s MR Vmean:        412.0 cm/s MR PISA:         1.57 cm MR PISA Eff ROA: 11 mm MR PISA Radius:  0.50 cm MV E velocity: 195.00 cm/s MV A velocity: 78.00 cm/s MV E/A ratio:  2.50 Jenkins Rouge MD Electronically signed by Jenkins Rouge MD Signature Date/Time: 12/31/2021/12:03:38 PM    Final    MR BRAIN WO CONTRAST  Result Date: 12/31/2021 CLINICAL DATA:  Follow-up examination for stroke, left facial droop, slurred speech. EXAM: MRI HEAD WITHOUT CONTRAST TECHNIQUE: Multiplanar, multiecho pulse sequences of the brain and surrounding structures were obtained without intravenous contrast. COMPARISON:  Prior CT from earlier the same day as well as previous MRI from 01/04/2021. FINDINGS: Brain: Generalized age-related cerebral atrophy. Patchy and confluent T2/FLAIR hyperintensity involving the periventricular deep white matter both cerebral hemispheres, most consistent with chronic small vessel ischemic disease. Few scattered remote lacunar infarcts present about the hemispheric cerebral white matter and deep gray nuclei. Additional scattered small volume cerebellar infarcts, left greater  than right. Small remote lacunar infarct noted at  the left pons. Frontal corona radiata, consistent with an acute ischemic infarct (series 9, image 76). No associated hemorrhage or mass effect. No other evidence for acute or subacute ischemia. Gray-white matter differentiation otherwise maintained. No mass lesion. Single punctate chronic microhemorrhage noted at the deep white matter of the posterior right frontoparietal centrum semi ovale, likely small vessel related. Bilateral subdural collections again noted, measuring up to 5 mm on the right and 3 mm on the left, likely subacute subdural hematomas, also seen on prior CT. No significant mass effect or midline shift. No hydrocephalus. Basilar cisterns remain patent. Pituitary gland and suprasellar region within normal limits. Vascular: Major intracranial vascular flow voids are maintained. Skull and upper cervical spine: Craniocervical junction normal. Bone marrow signal intensity diffusely decreased on T1 weighted sequence, nonspecific, but most commonly related to anemia, smoking or obesity. No focal marrow replacing lesion. No scalp soft tissue abnormality. Sinuses/Orbits: Globes and orbital soft tissues within normal limits. Scattered mucosal thickening noted within the ethmoidal air cells and maxillary sinuses. No mastoid effusion. Other: None. IMPRESSION: 1. Small acute ischemic nonhemorrhagic infarct involving the posterior right frontal corona radiata. No associated mass effect. 2. Small bilateral subdural collections, likely subacute hematomas, right slightly larger than left. No significant mass effect or midline shift. 3. Underlying atrophy with moderate chronic microvascular ischemic disease, with multiple remote lacunar infarcts as above. Electronically Signed   By: Jeannine Boga M.D.   On: 12/31/2021 01:28   CT HEAD CODE STROKE WO CONTRAST  Result Date: 12/30/2021 CLINICAL DATA:  Code stroke.  Left facial droop. EXAM: CT HEAD WITHOUT  CONTRAST TECHNIQUE: Contiguous axial images were obtained from the base of the skull through the vertex without intravenous contrast. RADIATION DOSE REDUCTION: This exam was performed according to the departmental dose-optimization program which includes automated exposure control, adjustment of the mA and/or kV according to patient size and/or use of iterative reconstruction technique. COMPARISON:  MR head without contrast 01/04/2021. CT head without contrast 01/03/2021 FINDINGS: Brain: Remote lacunar infarct of the left centrum semi ovale is noted. Remote lacunar infarcts of the right caudate head are also noted. A remote lacunar infarct is again noted within the right corona radiata. Remote lacunar infarcts are present in the cerebellum bilaterally. No acute cortical infarct or hemorrhage is present. Basal ganglia are otherwise intact. Insular ribbon is normal bilaterally. No acute infarct parenchymal hemorrhage is present. The ventricles are of normal size. No extra-axial collections are slightly larger on the right. Right-sided collection measures up to 5 mm and is of intermediate density. No significant mass effect or midline shift is present. A prominent cortical vein is noted over the right central sulcus. Dural sinuses are within normal limits. Vascular: Dense atherosclerotic calcifications are present within the cavernous internal carotid arteries and at the dural margin both vertebral arteries. No hyperdense vessel is present. Skull: Calvarium is intact. No focal lytic or blastic lesions are present. No significant extracranial soft tissue lesion is present. Sinuses/Orbits: The paranasal sinuses and mastoid air cells are clear. The globes and orbits are within normal limits. ASPECTS [facility] Stroke Program Early CT Score) - Ganglionic level infarction (caudate, lentiform nuclei, internal capsule, insula, M1-M3 cortex): 7/7 - Supraganglionic infarction (M4-M6 cortex): 3/3 Total score (0-10 with 10 being  normal): 10/10 IMPRESSION: 1. No definite acute infarct.  Aspects 10/10. 2. New intermediate density extra-axial collections likely represent subacute small subdural hematoma is measuring up to 5 mm on the right. No significant mass effect or midline shift is present.  3. Remote lacunar infarcts involving the left centrum semi ovale, right caudate head, and right corona radiata. 4. Remote lacunar infarcts of the cerebellum bilaterally. 5. Atherosclerosis. The above was relayed via text pager to [place] on 12/30/2021 at 13:32 . Electronically Signed   By: San Morelle M.D.   On: 12/30/2021 13:32     TODAY-DAY OF DISCHARGE:  Subjective:   Cynthia Cook today has no headache,no chest abdominal pain,no new weakness tingling or numbness, feels much better wants to go home today.   Objective:   Blood pressure (!) 137/53, pulse 72, temperature (!) 97.4 F (36.3 C), temperature source Axillary, resp. rate 20, height '5\' 6"'$  (1.676 m), weight 81 kg, SpO2 96 %.  Intake/Output Summary (Last 24 hours) at 12/31/2021 1425 Last data filed at 12/31/2021 0400 Gross per 24 hour  Intake 240 ml  Output --  Net 240 ml   Filed Weights   12/30/21 1317  Weight: 81 kg    Exam: Awake Alert, Oriented *3, No new F.N deficits, Normal affect [place].AT,PERRAL Supple Neck,No JVD, No cervical lymphadenopathy appriciated.  Symmetrical Chest wall movement, Good air movement bilaterally, CTAB RRR,No Gallops,Rubs or new Murmurs, No Parasternal Heave +ve B.Sounds, Abd Soft, Non tender, No organomegaly appriciated, No rebound -guarding or rigidity. No Cyanosis, Clubbing or edema, No new Rash or bruise   PERTINENT RADIOLOGIC STUDIES: VAS [place] TRANSCRANIAL DOPPLER  Result Date: 12/31/2021  Transcranial Doppler Patient Name:  Cynthia Cook  Date of Exam:   12/31/2021 Medical Rec #: [id number]       Accession #:    [id number] Date of Birth: 12/15/1955        Patient Gender: F Patient Age:   25 years Exam Location:  [facility] Procedure:      VAS [place] TRANSCRANIAL DOPPLER Referring Phys: Janine Ores --------------------------------------------------------------------------------  Indications: Stroke. History: Atrial fibrillation, HTN, HLD, Not on anticoagulation. Limitations: Poor windows Limitations for diagnostic windows: Unable to insonate right transtemporal window. Comparison Study: No prior study on file Performing Technologist: Sharion Dove RVS  Examination Guidelines: A complete evaluation includes B-mode imaging, spectral Doppler, color Doppler, and power Doppler as needed of all accessible portions of each vessel. Bilateral testing is considered an integral part of a complete examination. Limited examinations for reoccurring indications may be performed as noted.  +----------+-------------+----------+-----------+------------------+ RIGHT TCD Right VM (cm)Depth (cm)Pulsatility     Comment       +----------+-------------+----------+-----------+------------------+ MCA           32.00                 1.87                       +----------+-------------+----------+-----------+------------------+ ACA                                         unable to insonate +----------+-------------+----------+-----------+------------------+ Term ICA                                    unable to insonate +----------+-------------+----------+-----------+------------------+ PCA                                         unable to insonate +----------+-------------+----------+-----------+------------------+  Opthalmic     46.00                 1.94                       +----------+-------------+----------+-----------+------------------+ ICA siphon                                  unable to insonate +----------+-------------+----------+-----------+------------------+ Vertebral     35.40                 1.27                       +----------+-------------+----------+-----------+------------------+   +----------+------------+----------+-----------+------------------+ LEFT TCD  Left VM (cm)Depth (cm)Pulsatility     Comment       +----------+------------+----------+-----------+------------------+ MCA          26.80                 1.54                       +----------+------------+----------+-----------+------------------+ ACA                                        unable to insonate +----------+------------+----------+-----------+------------------+ Term ICA     22.50                 0.97                       +----------+------------+----------+-----------+------------------+ PCA                                        unable to insonate +----------+------------+----------+-----------+------------------+ Opthalmic    43.00                 1.31                       +----------+------------+----------+-----------+------------------+ ICA siphon                                 unable to insonate +----------+------------+----------+-----------+------------------+ Vertebral    -83.00                2.03                       +----------+------------+----------+-----------+------------------+  +------------+------+-------+             VM cm Comment +------------+------+-------+ Dist Basilar-47.60 1.48   +------------+------+-------+    Preliminary    VAS US CAROTID  Result Date: 12/31/2021 Carotid Arterial Duplex Study Patient Name:  Cynthia Cook  Date of Exam:   12/31/2021 Medical Rec #: [id number]       Accession #:    [id number] Date of Birth: 1956-01-18        Patient Gender: F Patient Age:   1 years Exam Location:  [facility] Procedure:      VAS US CAROTID Referring Phys: Wynetta Fines --------------------------------------------------------------------------------  Indication: CVA Risk Factors:      Hypertension, hyperlipidemia. Other Factors:     Paroxysmal atrial fibrillation, not on anticoagulation.  Comparison Study:  Prior carotid duplex  indicating bilateral 1-39% ICA stenosis                    done 01/04/21 Performing Technologist: Sharion Dove RVS  Examination Guidelines: A complete evaluation includes B-mode imaging, spectral Doppler, color Doppler, and power Doppler as needed of all accessible portions of each vessel. Bilateral testing is considered an integral part of a complete examination. Limited examinations for reoccurring indications may be performed as noted.  Right Carotid Findings: +----------+--------+--------+--------+------------------+--------+           PSV cm/sEDV cm/sStenosisPlaque DescriptionComments +----------+--------+--------+--------+------------------+--------+ CCA Prox  93      14                                         +----------+--------+--------+--------+------------------+--------+ CCA Distal101     14              calcific                   +----------+--------+--------+--------+------------------+--------+ ICA Prox  195     33      1-39%   heterogenous               +----------+--------+--------+--------+------------------+--------+ ICA Mid   179     29                                         +----------+--------+--------+--------+------------------+--------+ ICA Distal123     22                                         +----------+--------+--------+--------+------------------+--------+ ECA       106     11                                         +----------+--------+--------+--------+------------------+--------+ +----------+--------+-------+--------+-------------------+           PSV cm/sEDV cmsDescribeArm Pressure (mmHG) +----------+--------+-------+--------+-------------------+ [id number]                                        +----------+--------+-------+--------+-------------------+ +---------+--------+--+--------+--+ VertebralPSV cm/s54EDV cm/s10 +---------+--------+--+--------+--+  Left Carotid Findings:  +----------+--------+--------+--------+------------------+--------+           PSV cm/sEDV cm/sStenosisPlaque DescriptionComments +----------+--------+--------+--------+------------------+--------+ CCA Prox  85      9               calcific                   +----------+--------+--------+--------+------------------+--------+ CCA Distal88      13              heterogenous               +----------+--------+--------+--------+------------------+--------+ ICA Prox  78      13      1-39%   heterogenous               +----------+--------+--------+--------+------------------+--------+ ICA Mid   97      15                                         +----------+--------+--------+--------+------------------+--------+  ICA Distal82      22                                tortuous +----------+--------+--------+--------+------------------+--------+ ECA       86      5                                          +----------+--------+--------+--------+------------------+--------+ +----------+--------+--------+--------+-------------------+           PSV cm/sEDV cm/sDescribeArm Pressure (mmHG) +----------+--------+--------+--------+-------------------+ [id number]                                          +----------+--------+--------+--------+-------------------+ +---------+--------+--+--------+--+ VertebralPSV cm/s98EDV cm/s28 +---------+--------+--+--------+--+   Summary: Right Carotid: Velocities in the right ICA are consistent with a 1-39% stenosis. Left Carotid: Velocities in the left ICA are consistent with a 1-39% stenosis. Vertebrals:  Bilateral vertebral arteries demonstrate antegrade flow. Subclavians: Normal flow hemodynamics were seen in bilateral subclavian              arteries. *See table(s) above for measurements and observations.  Electronically signed by Antony Contras MD on 12/31/2021 at 12:44:04 PM.    Final    ECHOCARDIOGRAM COMPLETE  Result Date:  12/31/2021    ECHOCARDIOGRAM REPORT   Patient Name:   Cynthia Cook Date of Exam: 12/31/2021 Medical Rec #:  [id number]      Height:       66.0 in Accession #:    [id number]     Weight:       178.6 lb Date of Birth:  25-Feb-1956       BSA:          1.906 m Patient Age:    59 years       BP:           125/79 mmHg Patient Gender: F              HR:           67 bpm. Exam Location:  Inpatient Procedure: 2D Echo, Cardiac Doppler and Color Doppler Indications:    Stroke I63.9  History:        Patient has prior history of Echocardiogram examinations, most                 recent 01/04/2021. Arrythmias:Atrial Fibrillation.  Sonographer:    Bernadene Person RDCS Referring Phys: [id number] [place]  1. Left ventricular ejection fraction, by estimation, is 55 to 60%. The left ventricle has normal function. The left ventricle has no regional wall motion abnormalities. There is mild left ventricular hypertrophy. Left ventricular diastolic parameters are indeterminate.  2. Right ventricular systolic function is normal. The right ventricular size is normal. There is normal pulmonary artery systolic pressure.  3. Left atrial size was moderately dilated.  4. Right atrial size was moderately dilated.  5. Severe bulky posterior annular calcification mean diastolic gradient 5 mmHg but MVA estimated at > 2 cm2 . The mitral valve is degenerative. Moderate mitral valve regurgitation. No evidence of mitral stenosis. Severe mitral annular calcification.  6. The aortic valve is tricuspid. There is moderate calcification of the aortic valve. There is moderate thickening of the aortic valve. Aortic valve regurgitation is mild. Mild  aortic valve stenosis.  7. Aortic dilatation noted. There is moderate dilatation of the ascending aorta, measuring 42 mm.  8. The inferior vena cava is dilated in size with <50% respiratory variability, suggesting right atrial pressure of 15 mmHg. FINDINGS  Left Ventricle: Left ventricular ejection fraction,  by estimation, is 55 to 60%. The left ventricle has normal function. The left ventricle has no regional wall motion abnormalities. The left ventricular internal cavity size was normal in size. There is  mild left ventricular hypertrophy. Left ventricular diastolic parameters are indeterminate. Right Ventricle: The right ventricular size is normal. No increase in right ventricular wall thickness. Right ventricular systolic function is normal. There is normal pulmonary artery systolic pressure. The tricuspid regurgitant velocity is 2.39 m/s, and  with an assumed right atrial pressure of 8 mmHg, the estimated right ventricular systolic pressure is 48.5 mmHg. Left Atrium: Left atrial size was moderately dilated. Right Atrium: Right atrial size was moderately dilated. Pericardium: There is no evidence of pericardial effusion. Mitral Valve: Severe bulky posterior annular calcification mean diastolic gradient 5 mmHg but MVA estimated at > 2 cm2. The mitral valve is degenerative in appearance. There is severe thickening of the mitral valve leaflet(s). There is severe calcification of the mitral valve leaflet(s). Severe mitral annular calcification. Moderate mitral valve regurgitation. No evidence of mitral valve stenosis. MV peak gradient, 15.5 mmHg. The mean mitral valve gradient is 5.0 mmHg. Tricuspid Valve: The tricuspid valve is normal in structure. Tricuspid valve regurgitation is mild . No evidence of tricuspid stenosis. Aortic Valve: The aortic valve is tricuspid. There is moderate calcification of the aortic valve. There is moderate thickening of the aortic valve. Aortic valve regurgitation is mild. Aortic regurgitation PHT measures 309 msec. Mild aortic stenosis is present. Aortic valve mean gradient measures 11.5 mmHg. Aortic valve peak gradient measures 18.8 mmHg. Aortic valve area, by VTI measures 1.74 cm. Pulmonic Valve: The pulmonic valve was normal in structure. Pulmonic valve regurgitation is mild. No  evidence of pulmonic stenosis. Aorta: Aortic dilatation noted. There is moderate dilatation of the ascending aorta, measuring 42 mm. Venous: The inferior vena cava is dilated in size with less than 50% respiratory variability, suggesting right atrial pressure of 15 mmHg. IAS/Shunts: No atrial level shunt detected by color flow Doppler.  LEFT VENTRICLE PLAX 2D LVIDd:         4.00 cm      Diastology LVIDs:         3.40 cm      LV e' medial:    2.53 cm/s LV PW:         1.60 cm      LV E/e' medial:  77.1 LV IVS:        1.20 cm      LV e' lateral:   5.96 cm/s LVOT diam:     2.10 cm      LV E/e' lateral: 32.7 LV SV:         84 LV SV Index:   44 LVOT Area:     3.46 cm  LV Volumes (MOD) LV vol d, MOD A2C: 141.0 ml LV vol d, MOD A4C: 125.0 ml LV vol s, MOD A2C: 82.3 ml LV vol s, MOD A4C: 62.6 ml LV SV MOD A2C:     58.7 ml LV SV MOD A4C:     125.0 ml LV SV MOD BP:      64.7 ml RIGHT VENTRICLE RV S prime:     14.70 cm/s TAPSE (M-mode):  2.2 cm LEFT ATRIUM             Index        RIGHT ATRIUM           Index LA diam:        4.00 cm 2.10 cm/m   RA Area:     23.90 cm LA Vol (A2C):   74.6 ml 39.14 ml/m  RA Volume:   78.10 ml  40.98 ml/m LA Vol (A4C):   78.1 ml 40.98 ml/m LA Biplane Vol: 78.6 ml 41.24 ml/m  AORTIC VALVE AV Area (Vmax):    1.76 cm AV Area (Vmean):   1.60 cm AV Area (VTI):     1.74 cm AV Vmax:           217.00 cm/s AV Vmean:          158.500 cm/s AV VTI:            0.486 m AV Peak Grad:      18.8 mmHg AV Mean Grad:      11.5 mmHg LVOT Vmax:         110.00 cm/s LVOT Vmean:        73.200 cm/s LVOT VTI:          0.244 m LVOT/AV VTI ratio: 0.50 AI PHT:            309 msec AR Vena Contracta: 0.30 cm  AORTA Ao Root diam: 3.50 cm Ao Asc diam:  4.20 cm MITRAL VALVE                  TRICUSPID VALVE MV Area (PHT): 3.31 cm       TR Peak grad:   22.8 mmHg MV Area VTI:   1.37 cm       TR Vmax:        239.00 cm/s MV Peak grad:  15.5 mmHg MV Mean grad:  5.0 mmHg       SHUNTS MV Vmax:       1.97 m/s       Systemic VTI:   0.24 m MV Vmean:      98.7 cm/s      Systemic Diam: 2.10 cm MV Decel Time: 229 msec MR Peak grad:    124.5 mmHg MR Mean grad:    77.0 mmHg MR Vmax:         558.00 cm/s MR Vmean:        412.0 cm/s MR PISA:         1.57 cm MR PISA Eff ROA: 11 mm MR PISA Radius:  0.50 cm MV E velocity: 195.00 cm/s MV A velocity: 78.00 cm/s MV E/A ratio:  2.50 Jenkins Rouge MD Electronically signed by Jenkins Rouge MD Signature Date/Time: 12/31/2021/12:03:38 PM    Final    MR BRAIN WO CONTRAST  Result Date: 12/31/2021 CLINICAL DATA:  Follow-up examination for stroke, left facial droop, slurred speech. EXAM: MRI HEAD WITHOUT CONTRAST TECHNIQUE: Multiplanar, multiecho pulse sequences of the brain and surrounding structures were obtained without intravenous contrast. COMPARISON:  Prior CT from earlier the same day as well as previous MRI from 01/04/2021. FINDINGS: Brain: Generalized age-related cerebral atrophy. Patchy and confluent T2/FLAIR hyperintensity involving the periventricular deep white matter both cerebral hemispheres, most consistent with chronic small vessel ischemic disease. Few scattered remote lacunar infarcts present about the hemispheric cerebral white matter and deep gray nuclei. Additional scattered small volume cerebellar infarcts, left greater than right. Small remote lacunar infarct noted at the  left pons. Frontal corona radiata, consistent with an acute ischemic infarct (series 9, image 76). No associated hemorrhage or mass effect. No other evidence for acute or subacute ischemia. Gray-white matter differentiation otherwise maintained. No mass lesion. Single punctate chronic microhemorrhage noted at the deep white matter of the posterior right frontoparietal centrum semi ovale, likely small vessel related. Bilateral subdural collections again noted, measuring up to 5 mm on the right and 3 mm on the left, likely subacute subdural hematomas, also seen on prior CT. No significant mass effect or midline shift. No  hydrocephalus. Basilar cisterns remain patent. Pituitary gland and suprasellar region within normal limits. Vascular: Major intracranial vascular flow voids are maintained. Skull and upper cervical spine: Craniocervical junction normal. Bone marrow signal intensity diffusely decreased on T1 weighted sequence, nonspecific, but most commonly related to anemia, smoking or obesity. No focal marrow replacing lesion. No scalp soft tissue abnormality. Sinuses/Orbits: Globes and orbital soft tissues within normal limits. Scattered mucosal thickening noted within the ethmoidal air cells and maxillary sinuses. No mastoid effusion. Other: None. IMPRESSION: 1. Small acute ischemic nonhemorrhagic infarct involving the posterior right frontal corona radiata. No associated mass effect. 2. Small bilateral subdural collections, likely subacute hematomas, right slightly larger than left. No significant mass effect or midline shift. 3. Underlying atrophy with moderate chronic microvascular ischemic disease, with multiple remote lacunar infarcts as above. Electronically Signed   By: Jeannine Boga M.D.   On: 12/31/2021 01:28   CT HEAD CODE STROKE WO CONTRAST  Result Date: 12/30/2021 CLINICAL DATA:  Code stroke.  Left facial droop. EXAM: CT HEAD WITHOUT CONTRAST TECHNIQUE: Contiguous axial images were obtained from the base of the skull through the vertex without intravenous contrast. RADIATION DOSE REDUCTION: This exam was performed according to the departmental dose-optimization program which includes automated exposure control, adjustment of the mA and/or kV according to patient size and/or use of iterative reconstruction technique. COMPARISON:  MR head without contrast 01/04/2021. CT head without contrast 01/03/2021 FINDINGS: Brain: Remote lacunar infarct of the left centrum semi ovale is noted. Remote lacunar infarcts of the right caudate head are also noted. A remote lacunar infarct is again noted within the right corona  radiata. Remote lacunar infarcts are present in the cerebellum bilaterally. No acute cortical infarct or hemorrhage is present. Basal ganglia are otherwise intact. Insular ribbon is normal bilaterally. No acute infarct parenchymal hemorrhage is present. The ventricles are of normal size. No extra-axial collections are slightly larger on the right. Right-sided collection measures up to 5 mm and is of intermediate density. No significant mass effect or midline shift is present. A prominent cortical vein is noted over the right central sulcus. Dural sinuses are within normal limits. Vascular: Dense atherosclerotic calcifications are present within the cavernous internal carotid arteries and at the dural margin both vertebral arteries. No hyperdense vessel is present. Skull: Calvarium is intact. No focal lytic or blastic lesions are present. No significant extracranial soft tissue lesion is present. Sinuses/Orbits: The paranasal sinuses and mastoid air cells are clear. The globes and orbits are within normal limits. ASPECTS [facility] Stroke Program Early CT Score) - Ganglionic level infarction (caudate, lentiform nuclei, internal capsule, insula, M1-M3 cortex): 7/7 - Supraganglionic infarction (M4-M6 cortex): 3/3 Total score (0-10 with 10 being normal): 10/10 IMPRESSION: 1. No definite acute infarct.  Aspects 10/10. 2. New intermediate density extra-axial collections likely represent subacute small subdural hematoma is measuring up to 5 mm on the right. No significant mass effect or midline shift is present. 3.  Remote lacunar infarcts involving the left centrum semi ovale, right caudate head, and right corona radiata. 4. Remote lacunar infarcts of the cerebellum bilaterally. 5. Atherosclerosis. The above was relayed via text pager to [place] on 12/30/2021 at 13:32 . Electronically Signed   By: San Morelle M.D.   On: 12/30/2021 13:32     PERTINENT LAB RESULTS: CBC: Recent Labs    12/30/21 1311  12/30/21 1316  WBC 5.4  --   HGB 12.2 11.9*  HCT 36.2 35.0*  PLT 133*  --    CMET CMP     Component Value Date/Time   NA 138 12/30/2021 1316   NA 142 10/10/2021 1047   K 4.2 12/30/2021 1316   CL 98 12/30/2021 1316   CO2 28 12/30/2021 1311   GLUCOSE 67 (L) 12/30/2021 1316   BUN 29 (H) 12/30/2021 1316   BUN 28 (H) 10/10/2021 1047   CREATININE 1.40 (H) 12/30/2021 1316   CREATININE 1.32 (H) 11/02/2020 0859   CALCIUM 9.2 12/30/2021 1311   PROT 6.3 (L) 12/30/2021 1311   PROT 6.6 10/10/2021 1047   ALBUMIN 3.7 12/30/2021 1311   ALBUMIN 4.2 10/10/2021 1047   AST 21 12/30/2021 1311   ALT 12 12/30/2021 1311   ALKPHOS 46 12/30/2021 1311   BILITOT 1.6 (H) 12/30/2021 1311   BILITOT 1.4 (H) 10/10/2021 1047   GFRNONAA 41 (L) 12/30/2021 1311   GFRAA 57 (L) 03/16/2020 1435    GFR Estimated Creatinine Clearance: 41.3 mL/min (A) (by C-G formula based on SCr of 1.4 mg/dL (H)). No results for input(s): "LIPASE", "AMYLASE" in the last 72 hours. No results for input(s): "CKTOTAL", "CKMB", "CKMBINDEX", "TROPONINI" in the last 72 hours. Invalid input(s): "POCBNP" No results for input(s): "DDIMER" in the last 72 hours. No results for input(s): "HGBA1C" in the last 72 hours. Recent Labs    12/31/21 0615  CHOL 187  HDL 45  LDLCALC 127*  TRIG 73  CHOLHDL 4.2   Recent Labs    12/31/21 0615  TSH 3.386   No results for input(s): "VITAMINB12", "FOLATE", "FERRITIN", "TIBC", "IRON", "RETICCTPCT" in the last 72 hours. Coags: Recent Labs    12/30/21 1311  INR 1.1   Microbiology: No results found for this or any previous visit (from the past 240 hour(s)).  FURTHER DISCHARGE INSTRUCTIONS:  Get Medicines reviewed and adjusted: Please take all your medications with you for your next visit with your Primary MD  Laboratory/radiological data: Please request your Primary MD to go over all hospital tests and procedure/radiological results at the follow up, please ask your Primary MD to get  all Hospital records sent to his/her office.  In some cases, they will be blood work, cultures and biopsy results pending at the time of your discharge. Please request that your primary care M.D. goes through all the records of your hospital data and follows up on these results.  Also Note the following: If you experience worsening of your admission symptoms, develop shortness of breath, life threatening emergency, suicidal or homicidal thoughts you must seek medical attention immediately by calling 911 or calling your MD immediately  if symptoms less severe.  You must read complete instructions/literature along with all the possible adverse reactions/side effects for all the Medicines you take and that have been prescribed to you. Take any new Medicines after you have completely understood and accpet all the possible adverse reactions/side effects.   Do not drive when taking Pain medications or sleeping medications (Benzodaizepines)  Do not take more  than prescribed Pain, Sleep and Anxiety Medications. It is not advisable to combine anxiety,sleep and pain medications without talking with your primary care practitioner  Special Instructions: If you have smoked or chewed Tobacco  in the last 2 yrs please stop smoking, stop any regular Alcohol  and or any Recreational drug use.  Wear Seat belts while driving.  Please note: You were cared for by a hospitalist during your hospital stay. Once you are discharged, your primary care physician will handle any further medical issues. Please note that NO REFILLS for any discharge medications will be authorized once you are discharged, as it is imperative that you return to your primary care physician (or establish a relationship with a primary care physician if you do not have one) for your post hospital discharge needs so that they can reassess your need for medications and monitor your lab values.  Total Time spent coordinating discharge including  counseling, education and face to face time equals greater than 30 minutes.  Signed: Dharma Pare 12/31/2021 2:25 PM

## 2021-12-31 NOTE — Discharge Summary (Signed)
PATIENT DETAILS Name: Tylie Golonka Age: 66 y.o. Sex: female Date of Birth: Aug 09, 1955 MRN: [id number]. Admitting Physician: Lequita Halt, MD [id number], Sonia Side, NP  Admit Date: 12/30/2021 Discharge date: 12/31/2021  Recommendations for Outpatient Follow-up:  Follow up with PCP in 1-2 weeks Please obtain CMP/CBC in one week Please continue risks vs benefits discussion-re anticoagulation-see below  Admitted From:  Home  Disposition: Home   Discharge Condition: fair  CODE STATUS:   Code Status: Full Code   Diet recommendation:  Diet Order             Diet - low sodium heart healthy           Diet Heart Room service appropriate? Yes; Fluid consistency: Thin  Diet effective now                    Brief Summary: Patient is a 67 y.o.  female PAF, CVA-not on anticoagulation-presented with dysarthria/left facial droop-found to have acute CVA.  See below for further details.   Significant events: 9/16>> evaluated at [facility] ED-possible TIA.  MRI-no acute CVA.  Echo EF 55-60%.  Discharged on aspirin/statin. 9/22>> admitted [facility]-acute CVA.     Significant studies: 7/03>>a1c:5.2 9/22>> MRI brain: Small infarct involving right frontal corona radiator.  Small bilateral subdural collections-likely subacute hematomas. 9/23>> LDL 127 9/23>>Carotid doppler:1-39% stenosis bilaterally 9/23>>Echo:EF 55-60%   Significant microbiology data: None   Procedures: None   Consults: Neurology  Brief Hospital Course: Acute CVA: per neurology this is 2/2 to small disease, Work up as above. Discussed with Dr Leonie Man, continue ASA/Plavix for 3 weeks, followed by ASA alone. Apparently patient was not taking ASA at home. She refused to take statins-as she has not tolerated them well, will try Zetia instead.Apart from Left facial drop and mild dysarthria no other deficits   Small bilateral subdural hematomas: Seen on MRI-per Neuro-this is small and likely chronic  Paroxysmal atrial  fibrillation: Maintaining sinus rhythm-initially claimed that she could not afford DOAC due to cost, but upon further discussion, she also has significant concerns regarding side effects (bruising/ecchymosis)-and also that DOACs level could not be monitored and could not be reversed.  Per history-she previously has been intolerant to Coumadin-"they could never get my levels therapeutic".  Appears that the patient has done some research and does not want to go on anticoagulation.  She is aware that she will likely have recurrent CVAs-that could be life-threatening/life disabling.    Hypothyroidism: Continue current dose of Synthroid-repeat TSH in 6 to 8 weeks as minimally elevated.    HLD: Per nursing staff-has refused statin-she apparently is intolerant.  We will try Zetia.   CKD stage IIIa: At baseline   BMI: Estimated body mass index is 28.82 kg/m as calculated from the following:   Height as of this encounter: '5\' 6"'$  (1.676 m).   Weight as of this encounter: 81 kg.      Discharge Diagnoses:  Principal Problem:   CVA (cerebral vascular accident) [facility]) Active Problems:   Acute CVA (cerebrovascular accident) ([place])   Subdural hematoma ([place])   Discharge Instructions:  Activity:  As tolerated   Discharge Instructions     Call MD for:  extreme fatigue   Complete by: As directed    Call MD for:  persistant dizziness or light-headedness   Complete by: As directed    Diet - low sodium heart healthy   Complete by: As directed    Discharge instructions   Complete by: As directed  Follow with Primary MD  Armanda Heritage, NP in 1-2 weeks  Follow with your cardiologist in 1-2 weeks  Take Aspirin Plavix for 3 weeks, and then aspirin alone  Please get a complete blood count and chemistry panel checked by your Primary MD at your next visit, and again as instructed by your Primary MD.  Get Medicines reviewed and adjusted: Please take all your medications with you for your next visit  with your Primary MD  Laboratory/radiological data: Please request your Primary MD to go over all hospital tests and procedure/radiological results at the follow up, please ask your Primary MD to get all Hospital records sent to his/her office.  In some cases, they will be blood work, cultures and biopsy results pending at the time of your discharge. Please request that your primary care M.D. follows up on these results.  Also Note the following: If you experience worsening of your admission symptoms, develop shortness of breath, life threatening emergency, suicidal or homicidal thoughts you must seek medical attention immediately by calling 911 or calling your MD immediately  if symptoms less severe.  You must read complete instructions/literature along with all the possible adverse reactions/side effects for all the Medicines you take and that have been prescribed to you. Take any new Medicines after you have completely understood and accpet all the possible adverse reactions/side effects.   Do not drive when taking Pain medications or sleeping medications (Benzodaizepines)  Do not take more than prescribed Pain, Sleep and Anxiety Medications. It is not advisable to combine anxiety,sleep and pain medications without talking with your primary care practitioner  Special Instructions: If you have smoked or chewed Tobacco  in the last 2 yrs please stop smoking, stop any regular Alcohol  and or any Recreational drug use.  Wear Seat belts while driving.  Please note: You were cared for by a hospitalist during your hospital stay. Once you are discharged, your primary care physician will handle any further medical issues. Please note that NO REFILLS for any discharge medications will be authorized once you are discharged, as it is imperative that you return to your primary care physician (or establish a relationship with a primary care physician if you do not have one) for your post hospital discharge  needs so that they can reassess your need for medications and monitor your lab values.   Increase activity slowly   Complete by: As directed       Allergies as of 12/31/2021       Reactions   Codeine Nausea Only   Fentanyl Nausea And Vomiting   Dizziness, lightheaded         Medication List     TAKE these medications    ascorbic acid 500 MG tablet Commonly known as: VITAMIN C Take 500 mg by mouth daily.   aspirin 81 MG chewable tablet Chew 81 mg by mouth daily.   clopidogrel 75 MG tablet Commonly known as: PLAVIX Take 1 tablet (75 mg total) by mouth daily. Take along with aspirin for 3 weeks, following which stop plavix and continue Aspirin Start taking on: January 01, 2022   colchicine 0.6 MG tablet Take 2 tabs (1.'2mg'$ ) at initial sign of gout attack and 1 tab (0.'6mg'$ ) 1 hour later THEN take 1 tab (0.'6mg'$ ) twice a day until gout flair resolves.   ezetimibe 10 MG tablet Commonly known as: Zetia Take 1 tablet (10 mg total) by mouth daily.   levothyroxine 50 MCG tablet Commonly known as: SYNTHROID Take 1  tablet (50 mcg) by mouth on Monday, Wednesday, and Friday   levothyroxine 75 MCG tablet Commonly known as: SYNTHROID Take 1 tablet (75 mcg) by mouth on Tuesday, Thursday, Saturday, and 'Sunday   predniSONE 20 MG tablet Commonly known as: DELTASONE Take 2 tablets (40 mg total) by mouth daily with breakfast for 2 days, THEN 1 tablet (20 mg total) daily with breakfast for 2 days, THEN 0.5 tablets (10 mg total) daily with breakfast for 4 days.   triamcinolone cream 0.1 % Commonly known as: KENALOG Apply 1 application. topically daily as needed (rash).   vitamin E 180 MG (400 UNITS) capsule Take 400 Units by mouth daily.   VITAMIN K2-VITAMIN D3 PO Take 1 tablet by mouth daily.        Follow-up Information     Edwards, Jerry, NP. Schedule an appointment as soon as possible for a visit in 1 week(s).   Specialty: Nurse Practitioner Contact information: [address] [place] [address] [phone]         Christopher, Bridgette, MD Follow up in 1 week(s).   Specialty: Cardiology Contact information: [address] [place]  [address] [phone]                Allergies  Allergen Reactions   Codeine Nausea Only   Fentanyl Nausea And Vomiting    Dizziness, lightheaded      Other Procedures/Studies: VAS US TRANSCRANIAL DOPPLER  Result Date: 12/31/2021  Transcranial Doppler Patient Name:  Donise Balderston  Date of Exam:   12/31/2021 Medical Rec #: [id number]       Accession #:    [id number] Date of Birth: 08/08/1955        Patient Gender: F Patient Age:   66 years Exam Location:  [facility] Procedure:      VAS US TRANSCRANIAL DOPPLER Referring Phys: DEVON SHAFER --------------------------------------------------------------------------------  Indications: Stroke. History: Atrial fibrillation, HTN, HLD, Not on anticoagulation. Limitations: Poor windows Limitations for diagnostic windows: Unable to insonate right transtemporal window. Comparison Study: No prior study on file Performing Technologist: Candace Kanady RVS  Examination Guidelines: A complete evaluation includes B-mode imaging, spectral Doppler, color Doppler, and power Doppler as needed of all accessible portions of each vessel. Bilateral testing is considered an integral part of a complete examination. Limited examinations for reoccurring indications may be performed as noted.  +----------+-------------+----------+-----------+------------------+ RIGHT TCD Right VM (cm)Depth (cm)Pulsatility     Comment       +----------+-------------+----------+-----------+------------------+ MCA           32'$ .00                 1.87                       +----------+-------------+----------+-----------+------------------+ ACA                                         unable to insonate  +----------+-------------+----------+-----------+------------------+ Term ICA                                    unable to insonate +----------+-------------+----------+-----------+------------------+ PCA  unable to insonate +----------+-------------+----------+-----------+------------------+ Opthalmic     46.00                 1.94                       +----------+-------------+----------+-----------+------------------+ ICA siphon                                  unable to insonate +----------+-------------+----------+-----------+------------------+ Vertebral     35.40                 1.27                       +----------+-------------+----------+-----------+------------------+  +----------+------------+----------+-----------+------------------+ LEFT TCD  Left VM (cm)Depth (cm)Pulsatility     Comment       +----------+------------+----------+-----------+------------------+ MCA          26.80                 1.54                       +----------+------------+----------+-----------+------------------+ ACA                                        unable to insonate +----------+------------+----------+-----------+------------------+ Term ICA     22.50                 0.97                       +----------+------------+----------+-----------+------------------+ PCA                                        unable to insonate +----------+------------+----------+-----------+------------------+ Opthalmic    43.00                 1.31                       +----------+------------+----------+-----------+------------------+ ICA siphon                                 unable to insonate +----------+------------+----------+-----------+------------------+ Vertebral    -83.00                2.03                       +----------+------------+----------+-----------+------------------+  +------------+------+-------+              VM cm Comment +------------+------+-------+ Dist Basilar-47.60 1.48   +------------+------+-------+    Preliminary    VAS US CAROTID  Result Date: 12/31/2021 Carotid Arterial Duplex Study Patient Name:  YENIFER SACCENTE  Date of Exam:   12/31/2021 Medical Rec #: [id number]       Accession #:    [id number] Date of Birth: 12/16/1955        Patient Gender: F Patient Age:   59 years Exam Location:  [facility] Procedure:      VAS US CAROTID Referring Phys: Wynetta Fines --------------------------------------------------------------------------------  Indication: CVA Risk Factors:      Hypertension, hyperlipidemia. Other Factors:     Paroxysmal atrial  fibrillation, not on anticoagulation. Comparison Study:  Prior carotid duplex indicating bilateral 1-39% ICA stenosis                    done 01/04/21 Performing Technologist: Sharion Dove RVS  Examination Guidelines: A complete evaluation includes B-mode imaging, spectral Doppler, color Doppler, and power Doppler as needed of all accessible portions of each vessel. Bilateral testing is considered an integral part of a complete examination. Limited examinations for reoccurring indications may be performed as noted.  Right Carotid Findings: +----------+--------+--------+--------+------------------+--------+           PSV cm/sEDV cm/sStenosisPlaque DescriptionComments +----------+--------+--------+--------+------------------+--------+ CCA Prox  93      14                                         +----------+--------+--------+--------+------------------+--------+ CCA Distal101     14              calcific                   +----------+--------+--------+--------+------------------+--------+ ICA Prox  195     33      1-39%   heterogenous               +----------+--------+--------+--------+------------------+--------+ ICA Mid   179     29                                          +----------+--------+--------+--------+------------------+--------+ ICA Distal123     22                                         +----------+--------+--------+--------+------------------+--------+ ECA       106     11                                         +----------+--------+--------+--------+------------------+--------+ +----------+--------+-------+--------+-------------------+           PSV cm/sEDV cmsDescribeArm Pressure (mmHG) +----------+--------+-------+--------+-------------------+ [id number]                                        +----------+--------+-------+--------+-------------------+ +---------+--------+--+--------+--+ VertebralPSV cm/s54EDV cm/s10 +---------+--------+--+--------+--+  Left Carotid Findings: +----------+--------+--------+--------+------------------+--------+           PSV cm/sEDV cm/sStenosisPlaque DescriptionComments +----------+--------+--------+--------+------------------+--------+ CCA Prox  85      9               calcific                   +----------+--------+--------+--------+------------------+--------+ CCA Distal88      13              heterogenous               +----------+--------+--------+--------+------------------+--------+ ICA Prox  78      13      1-39%   heterogenous               +----------+--------+--------+--------+------------------+--------+ ICA Mid   97      15                                         +----------+--------+--------+--------+------------------+--------+  ICA Distal82      22                                tortuous +----------+--------+--------+--------+------------------+--------+ ECA       86      5                                          +----------+--------+--------+--------+------------------+--------+ +----------+--------+--------+--------+-------------------+           PSV cm/sEDV cm/sDescribeArm Pressure (mmHG)  +----------+--------+--------+--------+-------------------+ [id number]                                          +----------+--------+--------+--------+-------------------+ +---------+--------+--+--------+--+ VertebralPSV cm/s98EDV cm/s28 +---------+--------+--+--------+--+   Summary: Right Carotid: Velocities in the right ICA are consistent with a 1-39% stenosis. Left Carotid: Velocities in the left ICA are consistent with a 1-39% stenosis. Vertebrals:  Bilateral vertebral arteries demonstrate antegrade flow. Subclavians: Normal flow hemodynamics were seen in bilateral subclavian              arteries. *See table(s) above for measurements and observations.  Electronically signed by Antony Contras MD on 12/31/2021 at 12:44:04 PM.    Final    ECHOCARDIOGRAM COMPLETE  Result Date: 12/31/2021    ECHOCARDIOGRAM REPORT   Patient Name:   MEKENNA FINAU Date of Exam: 12/31/2021 Medical Rec #:  [id number]      Height:       66.0 in Accession #:    [id number]     Weight:       178.6 lb Date of Birth:  1955-05-17       BSA:          1.906 m Patient Age:    61 years       BP:           125/79 mmHg Patient Gender: F              HR:           67 bpm. Exam Location:  Inpatient Procedure: 2D Echo, Cardiac Doppler and Color Doppler Indications:    Stroke I63.9  History:        Patient has prior history of Echocardiogram examinations, most                 recent 01/04/2021. Arrythmias:Atrial Fibrillation.  Sonographer:    Bernadene Person RDCS Referring Phys: [id number] [place]  1. Left ventricular ejection fraction, by estimation, is 55 to 60%. The left ventricle has normal function. The left ventricle has no regional wall motion abnormalities. There is mild left ventricular hypertrophy. Left ventricular diastolic parameters are indeterminate.  2. Right ventricular systolic function is normal. The right ventricular size is normal. There is normal pulmonary artery systolic pressure.  3. Left atrial size was  moderately dilated.  4. Right atrial size was moderately dilated.  5. Severe bulky posterior annular calcification mean diastolic gradient 5 mmHg but MVA estimated at > 2 cm2 . The mitral valve is degenerative. Moderate mitral valve regurgitation. No evidence of mitral stenosis. Severe mitral annular calcification.  6. The aortic valve is tricuspid. There is moderate calcification of the aortic valve. There is moderate thickening of the aortic valve. Aortic valve regurgitation is mild.  Mild aortic valve stenosis.  7. Aortic dilatation noted. There is moderate dilatation of the ascending aorta, measuring 42 mm.  8. The inferior vena cava is dilated in size with <50% respiratory variability, suggesting right atrial pressure of 15 mmHg. FINDINGS  Left Ventricle: Left ventricular ejection fraction, by estimation, is 55 to 60%. The left ventricle has normal function. The left ventricle has no regional wall motion abnormalities. The left ventricular internal cavity size was normal in size. There is  mild left ventricular hypertrophy. Left ventricular diastolic parameters are indeterminate. Right Ventricle: The right ventricular size is normal. No increase in right ventricular wall thickness. Right ventricular systolic function is normal. There is normal pulmonary artery systolic pressure. The tricuspid regurgitant velocity is 2.39 m/s, and  with an assumed right atrial pressure of 8 mmHg, the estimated right ventricular systolic pressure is 94.1 mmHg. Left Atrium: Left atrial size was moderately dilated. Right Atrium: Right atrial size was moderately dilated. Pericardium: There is no evidence of pericardial effusion. Mitral Valve: Severe bulky posterior annular calcification mean diastolic gradient 5 mmHg but MVA estimated at > 2 cm2. The mitral valve is degenerative in appearance. There is severe thickening of the mitral valve leaflet(s). There is severe calcification of the mitral valve leaflet(s). Severe mitral annular  calcification. Moderate mitral valve regurgitation. No evidence of mitral valve stenosis. MV peak gradient, 15.5 mmHg. The mean mitral valve gradient is 5.0 mmHg. Tricuspid Valve: The tricuspid valve is normal in structure. Tricuspid valve regurgitation is mild . No evidence of tricuspid stenosis. Aortic Valve: The aortic valve is tricuspid. There is moderate calcification of the aortic valve. There is moderate thickening of the aortic valve. Aortic valve regurgitation is mild. Aortic regurgitation PHT measures 309 msec. Mild aortic stenosis is present. Aortic valve mean gradient measures 11.5 mmHg. Aortic valve peak gradient measures 18.8 mmHg. Aortic valve area, by VTI measures 1.74 cm. Pulmonic Valve: The pulmonic valve was normal in structure. Pulmonic valve regurgitation is mild. No evidence of pulmonic stenosis. Aorta: Aortic dilatation noted. There is moderate dilatation of the ascending aorta, measuring 42 mm. Venous: The inferior vena cava is dilated in size with less than 50% respiratory variability, suggesting right atrial pressure of 15 mmHg. IAS/Shunts: No atrial level shunt detected by color flow Doppler.  LEFT VENTRICLE PLAX 2D LVIDd:         4.00 cm      Diastology LVIDs:         3.40 cm      LV e' medial:    2.53 cm/s LV PW:         1.60 cm      LV E/e' medial:  77.1 LV IVS:        1.20 cm      LV e' lateral:   5.96 cm/s LVOT diam:     2.10 cm      LV E/e' lateral: 32.7 LV SV:         84 LV SV Index:   44 LVOT Area:     3.46 cm  LV Volumes (MOD) LV vol d, MOD A2C: 141.0 ml LV vol d, MOD A4C: 125.0 ml LV vol s, MOD A2C: 82.3 ml LV vol s, MOD A4C: 62.6 ml LV SV MOD A2C:     58.7 ml LV SV MOD A4C:     125.0 ml LV SV MOD BP:      64.7 ml RIGHT VENTRICLE RV S prime:     14.70 cm/s TAPSE (M-mode):  2.2 cm LEFT ATRIUM             Index        RIGHT ATRIUM           Index LA diam:        4.00 cm 2.10 cm/m   RA Area:     23.90 cm LA Vol (A2C):   74.6 ml 39.14 ml/m  RA Volume:   78.10 ml  40.98 ml/m LA  Vol (A4C):   78.1 ml 40.98 ml/m LA Biplane Vol: 78.6 ml 41.24 ml/m  AORTIC VALVE AV Area (Vmax):    1.76 cm AV Area (Vmean):   1.60 cm AV Area (VTI):     1.74 cm AV Vmax:           217.00 cm/s AV Vmean:          158.500 cm/s AV VTI:            0.486 m AV Peak Grad:      18.8 mmHg AV Mean Grad:      11.5 mmHg LVOT Vmax:         110.00 cm/s LVOT Vmean:        73.200 cm/s LVOT VTI:          0.244 m LVOT/AV VTI ratio: 0.50 AI PHT:            309 msec AR Vena Contracta: 0.30 cm  AORTA Ao Root diam: 3.50 cm Ao Asc diam:  4.20 cm MITRAL VALVE                  TRICUSPID VALVE MV Area (PHT): 3.31 cm       TR Peak grad:   22.8 mmHg MV Area VTI:   1.37 cm       TR Vmax:        239.00 cm/s MV Peak grad:  15.5 mmHg MV Mean grad:  5.0 mmHg       SHUNTS MV Vmax:       1.97 m/s       Systemic VTI:  0.24 m MV Vmean:      98.7 cm/s      Systemic Diam: 2.10 cm MV Decel Time: 229 msec MR Peak grad:    124.5 mmHg MR Mean grad:    77.0 mmHg MR Vmax:         558.00 cm/s MR Vmean:        412.0 cm/s MR PISA:         1.57 cm MR PISA Eff ROA: 11 mm MR PISA Radius:  0.50 cm MV E velocity: 195.00 cm/s MV A velocity: 78.00 cm/s MV E/A ratio:  2.50 Jenkins Rouge MD Electronically signed by Jenkins Rouge MD Signature Date/Time: 12/31/2021/12:03:38 PM    Final    MR BRAIN WO CONTRAST  Result Date: 12/31/2021 CLINICAL DATA:  Follow-up examination for stroke, left facial droop, slurred speech. EXAM: MRI HEAD WITHOUT CONTRAST TECHNIQUE: Multiplanar, multiecho pulse sequences of the brain and surrounding structures were obtained without intravenous contrast. COMPARISON:  Prior CT from earlier the same day as well as previous MRI from 01/04/2021. FINDINGS: Brain: Generalized age-related cerebral atrophy. Patchy and confluent T2/FLAIR hyperintensity involving the periventricular deep white matter both cerebral hemispheres, most consistent with chronic small vessel ischemic disease. Few scattered remote lacunar infarcts present about the  hemispheric cerebral white matter and deep gray nuclei. Additional scattered small volume cerebellar infarcts, left greater than right. Small remote lacunar infarct noted at  the left pons. Frontal corona radiata, consistent with an acute ischemic infarct (series 9, image 76). No associated hemorrhage or mass effect. No other evidence for acute or subacute ischemia. Gray-white matter differentiation otherwise maintained. No mass lesion. Single punctate chronic microhemorrhage noted at the deep white matter of the posterior right frontoparietal centrum semi ovale, likely small vessel related. Bilateral subdural collections again noted, measuring up to 5 mm on the right and 3 mm on the left, likely subacute subdural hematomas, also seen on prior CT. No significant mass effect or midline shift. No hydrocephalus. Basilar cisterns remain patent. Pituitary gland and suprasellar region within normal limits. Vascular: Major intracranial vascular flow voids are maintained. Skull and upper cervical spine: Craniocervical junction normal. Bone marrow signal intensity diffusely decreased on T1 weighted sequence, nonspecific, but most commonly related to anemia, smoking or obesity. No focal marrow replacing lesion. No scalp soft tissue abnormality. Sinuses/Orbits: Globes and orbital soft tissues within normal limits. Scattered mucosal thickening noted within the ethmoidal air cells and maxillary sinuses. No mastoid effusion. Other: None. IMPRESSION: 1. Small acute ischemic nonhemorrhagic infarct involving the posterior right frontal corona radiata. No associated mass effect. 2. Small bilateral subdural collections, likely subacute hematomas, right slightly larger than left. No significant mass effect or midline shift. 3. Underlying atrophy with moderate chronic microvascular ischemic disease, with multiple remote lacunar infarcts as above. Electronically Signed   By: Jeannine Boga M.D.   On: 12/31/2021 01:28   CT HEAD  CODE STROKE WO CONTRAST  Result Date: 12/30/2021 CLINICAL DATA:  Code stroke.  Left facial droop. EXAM: CT HEAD WITHOUT CONTRAST TECHNIQUE: Contiguous axial images were obtained from the base of the skull through the vertex without intravenous contrast. RADIATION DOSE REDUCTION: This exam was performed according to the departmental dose-optimization program which includes automated exposure control, adjustment of the mA and/or kV according to patient size and/or use of iterative reconstruction technique. COMPARISON:  MR head without contrast 01/04/2021. CT head without contrast 01/03/2021 FINDINGS: Brain: Remote lacunar infarct of the left centrum semi ovale is noted. Remote lacunar infarcts of the right caudate head are also noted. A remote lacunar infarct is again noted within the right corona radiata. Remote lacunar infarcts are present in the cerebellum bilaterally. No acute cortical infarct or hemorrhage is present. Basal ganglia are otherwise intact. Insular ribbon is normal bilaterally. No acute infarct parenchymal hemorrhage is present. The ventricles are of normal size. No extra-axial collections are slightly larger on the right. Right-sided collection measures up to 5 mm and is of intermediate density. No significant mass effect or midline shift is present. A prominent cortical vein is noted over the right central sulcus. Dural sinuses are within normal limits. Vascular: Dense atherosclerotic calcifications are present within the cavernous internal carotid arteries and at the dural margin both vertebral arteries. No hyperdense vessel is present. Skull: Calvarium is intact. No focal lytic or blastic lesions are present. No significant extracranial soft tissue lesion is present. Sinuses/Orbits: The paranasal sinuses and mastoid air cells are clear. The globes and orbits are within normal limits. ASPECTS [facility] Stroke Program Early CT Score) - Ganglionic level infarction (caudate, lentiform nuclei,  internal capsule, insula, M1-M3 cortex): 7/7 - Supraganglionic infarction (M4-M6 cortex): 3/3 Total score (0-10 with 10 being normal): 10/10 IMPRESSION: 1. No definite acute infarct.  Aspects 10/10. 2. New intermediate density extra-axial collections likely represent subacute small subdural hematoma is measuring up to 5 mm on the right. No significant mass effect or midline shift is present.  3. Remote lacunar infarcts involving the left centrum semi ovale, right caudate head, and right corona radiata. 4. Remote lacunar infarcts of the cerebellum bilaterally. 5. Atherosclerosis. The above was relayed via text pager to [place] on 12/30/2021 at 13:32 . Electronically Signed   By: San Morelle M.D.   On: 12/30/2021 13:32     TODAY-DAY OF DISCHARGE:  Subjective:   Shireen Rayburn today has no headache,no chest abdominal pain,no new weakness tingling or numbness, feels much better wants to go home today.   Objective:   Blood pressure (!) 137/53, pulse 72, temperature (!) 97.4 F (36.3 C), temperature source Axillary, resp. rate 20, height '5\' 6"'$  (1.676 m), weight 81 kg, SpO2 96 %.  Intake/Output Summary (Last 24 hours) at 12/31/2021 1435 Last data filed at 12/31/2021 0400 Gross per 24 hour  Intake 240 ml  Output --  Net 240 ml    Filed Weights   12/30/21 1317  Weight: 81 kg    Exam: Awake Alert, Oriented *3, No new F.N deficits, Normal affect [place].AT,PERRAL Supple Neck,No JVD, No cervical lymphadenopathy appriciated.  Symmetrical Chest wall movement, Good air movement bilaterally, CTAB RRR,No Gallops,Rubs or new Murmurs, No Parasternal Heave +ve B.Sounds, Abd Soft, Non tender, No organomegaly appriciated, No rebound -guarding or rigidity. No Cyanosis, Clubbing or edema, No new Rash or bruise   PERTINENT RADIOLOGIC STUDIES: VAS [place] TRANSCRANIAL DOPPLER  Result Date: 12/31/2021  Transcranial Doppler Patient Name:  YASLYN CUMBY  Date of Exam:   12/31/2021 Medical Rec #: [id number]        Accession #:    [id number] Date of Birth: June 21, 1955        Patient Gender: F Patient Age:   15 years Exam Location:  [facility] Procedure:      VAS [place] TRANSCRANIAL DOPPLER Referring Phys: Janine Ores --------------------------------------------------------------------------------  Indications: Stroke. History: Atrial fibrillation, HTN, HLD, Not on anticoagulation. Limitations: Poor windows Limitations for diagnostic windows: Unable to insonate right transtemporal window. Comparison Study: No prior study on file Performing Technologist: Sharion Dove RVS  Examination Guidelines: A complete evaluation includes B-mode imaging, spectral Doppler, color Doppler, and power Doppler as needed of all accessible portions of each vessel. Bilateral testing is considered an integral part of a complete examination. Limited examinations for reoccurring indications may be performed as noted.  +----------+-------------+----------+-----------+------------------+ RIGHT TCD Right VM (cm)Depth (cm)Pulsatility     Comment       +----------+-------------+----------+-----------+------------------+ MCA           32.00                 1.87                       +----------+-------------+----------+-----------+------------------+ ACA                                         unable to insonate +----------+-------------+----------+-----------+------------------+ Term ICA                                    unable to insonate +----------+-------------+----------+-----------+------------------+ PCA                                         unable to insonate +----------+-------------+----------+-----------+------------------+  Opthalmic     46.00                 1.94                       +----------+-------------+----------+-----------+------------------+ ICA siphon                                  unable to insonate +----------+-------------+----------+-----------+------------------+  Vertebral     35.40                 1.27                       +----------+-------------+----------+-----------+------------------+  +----------+------------+----------+-----------+------------------+ LEFT TCD  Left VM (cm)Depth (cm)Pulsatility     Comment       +----------+------------+----------+-----------+------------------+ MCA          26.80                 1.54                       +----------+------------+----------+-----------+------------------+ ACA                                        unable to insonate +----------+------------+----------+-----------+------------------+ Term ICA     22.50                 0.97                       +----------+------------+----------+-----------+------------------+ PCA                                        unable to insonate +----------+------------+----------+-----------+------------------+ Opthalmic    43.00                 1.31                       +----------+------------+----------+-----------+------------------+ ICA siphon                                 unable to insonate +----------+------------+----------+-----------+------------------+ Vertebral    -83.00                2.03                       +----------+------------+----------+-----------+------------------+  +------------+------+-------+             VM cm Comment +------------+------+-------+ Dist Basilar-47.60 1.48   +------------+------+-------+    Preliminary    VAS US CAROTID  Result Date: 12/31/2021 Carotid Arterial Duplex Study Patient Name:  MELROSE KEARSE  Date of Exam:   12/31/2021 Medical Rec #: [id number]       Accession #:    [id number] Date of Birth: Jun 04, 1955        Patient Gender: F Patient Age:   54 years Exam Location:  [facility] Procedure:      VAS US CAROTID Referring Phys: Wynetta Fines --------------------------------------------------------------------------------  Indication: CVA Risk Factors:       Hypertension, hyperlipidemia. Other Factors:     Paroxysmal atrial fibrillation, not on  anticoagulation. Comparison Study:  Prior carotid duplex indicating bilateral 1-39% ICA stenosis                    done 01/04/21 Performing Technologist: Sharion Dove RVS  Examination Guidelines: A complete evaluation includes B-mode imaging, spectral Doppler, color Doppler, and power Doppler as needed of all accessible portions of each vessel. Bilateral testing is considered an integral part of a complete examination. Limited examinations for reoccurring indications may be performed as noted.  Right Carotid Findings: +----------+--------+--------+--------+------------------+--------+           PSV cm/sEDV cm/sStenosisPlaque DescriptionComments +----------+--------+--------+--------+------------------+--------+ CCA Prox  93      14                                         +----------+--------+--------+--------+------------------+--------+ CCA Distal101     14              calcific                   +----------+--------+--------+--------+------------------+--------+ ICA Prox  195     33      1-39%   heterogenous               +----------+--------+--------+--------+------------------+--------+ ICA Mid   179     29                                         +----------+--------+--------+--------+------------------+--------+ ICA Distal123     22                                         +----------+--------+--------+--------+------------------+--------+ ECA       106     11                                         +----------+--------+--------+--------+------------------+--------+ +----------+--------+-------+--------+-------------------+           PSV cm/sEDV cmsDescribeArm Pressure (mmHG) +----------+--------+-------+--------+-------------------+ [id number]                                        +----------+--------+-------+--------+-------------------+  +---------+--------+--+--------+--+ VertebralPSV cm/s54EDV cm/s10 +---------+--------+--+--------+--+  Left Carotid Findings: +----------+--------+--------+--------+------------------+--------+           PSV cm/sEDV cm/sStenosisPlaque DescriptionComments +----------+--------+--------+--------+------------------+--------+ CCA Prox  85      9               calcific                   +----------+--------+--------+--------+------------------+--------+ CCA Distal88      13              heterogenous               +----------+--------+--------+--------+------------------+--------+ ICA Prox  78      13      1-39%   heterogenous               +----------+--------+--------+--------+------------------+--------+ ICA Mid   97      15                                         +----------+--------+--------+--------+------------------+--------+  ICA Distal82      22                                tortuous +----------+--------+--------+--------+------------------+--------+ ECA       86      5                                          +----------+--------+--------+--------+------------------+--------+ +----------+--------+--------+--------+-------------------+           PSV cm/sEDV cm/sDescribeArm Pressure (mmHG) +----------+--------+--------+--------+-------------------+ [id number]                                          +----------+--------+--------+--------+-------------------+ +---------+--------+--+--------+--+ VertebralPSV cm/s98EDV cm/s28 +---------+--------+--+--------+--+   Summary: Right Carotid: Velocities in the right ICA are consistent with a 1-39% stenosis. Left Carotid: Velocities in the left ICA are consistent with a 1-39% stenosis. Vertebrals:  Bilateral vertebral arteries demonstrate antegrade flow. Subclavians: Normal flow hemodynamics were seen in bilateral subclavian              arteries. *See table(s) above for measurements and  observations.  Electronically signed by Antony Contras MD on 12/31/2021 at 12:44:04 PM.    Final    ECHOCARDIOGRAM COMPLETE  Result Date: 12/31/2021    ECHOCARDIOGRAM REPORT   Patient Name:   HAYDAN WEDIG Date of Exam: 12/31/2021 Medical Rec #:  [id number]      Height:       66.0 in Accession #:    [id number]     Weight:       178.6 lb Date of Birth:  Apr 26, 1955       BSA:          1.906 m Patient Age:    67 years       BP:           125/79 mmHg Patient Gender: F              HR:           67 bpm. Exam Location:  Inpatient Procedure: 2D Echo, Cardiac Doppler and Color Doppler Indications:    Stroke I63.9  History:        Patient has prior history of Echocardiogram examinations, most                 recent 01/04/2021. Arrythmias:Atrial Fibrillation.  Sonographer:    Bernadene Person RDCS Referring Phys: [id number] [place]  1. Left ventricular ejection fraction, by estimation, is 55 to 60%. The left ventricle has normal function. The left ventricle has no regional wall motion abnormalities. There is mild left ventricular hypertrophy. Left ventricular diastolic parameters are indeterminate.  2. Right ventricular systolic function is normal. The right ventricular size is normal. There is normal pulmonary artery systolic pressure.  3. Left atrial size was moderately dilated.  4. Right atrial size was moderately dilated.  5. Severe bulky posterior annular calcification mean diastolic gradient 5 mmHg but MVA estimated at > 2 cm2 . The mitral valve is degenerative. Moderate mitral valve regurgitation. No evidence of mitral stenosis. Severe mitral annular calcification.  6. The aortic valve is tricuspid. There is moderate calcification of the aortic valve. There is moderate thickening of the aortic valve. Aortic valve regurgitation is mild. Mild  aortic valve stenosis.  7. Aortic dilatation noted. There is moderate dilatation of the ascending aorta, measuring 42 mm.  8. The inferior vena cava is dilated in size  with <50% respiratory variability, suggesting right atrial pressure of 15 mmHg. FINDINGS  Left Ventricle: Left ventricular ejection fraction, by estimation, is 55 to 60%. The left ventricle has normal function. The left ventricle has no regional wall motion abnormalities. The left ventricular internal cavity size was normal in size. There is  mild left ventricular hypertrophy. Left ventricular diastolic parameters are indeterminate. Right Ventricle: The right ventricular size is normal. No increase in right ventricular wall thickness. Right ventricular systolic function is normal. There is normal pulmonary artery systolic pressure. The tricuspid regurgitant velocity is 2.39 m/s, and  with an assumed right atrial pressure of 8 mmHg, the estimated right ventricular systolic pressure is 76.2 mmHg. Left Atrium: Left atrial size was moderately dilated. Right Atrium: Right atrial size was moderately dilated. Pericardium: There is no evidence of pericardial effusion. Mitral Valve: Severe bulky posterior annular calcification mean diastolic gradient 5 mmHg but MVA estimated at > 2 cm2. The mitral valve is degenerative in appearance. There is severe thickening of the mitral valve leaflet(s). There is severe calcification of the mitral valve leaflet(s). Severe mitral annular calcification. Moderate mitral valve regurgitation. No evidence of mitral valve stenosis. MV peak gradient, 15.5 mmHg. The mean mitral valve gradient is 5.0 mmHg. Tricuspid Valve: The tricuspid valve is normal in structure. Tricuspid valve regurgitation is mild . No evidence of tricuspid stenosis. Aortic Valve: The aortic valve is tricuspid. There is moderate calcification of the aortic valve. There is moderate thickening of the aortic valve. Aortic valve regurgitation is mild. Aortic regurgitation PHT measures 309 msec. Mild aortic stenosis is present. Aortic valve mean gradient measures 11.5 mmHg. Aortic valve peak gradient measures 18.8 mmHg. Aortic  valve area, by VTI measures 1.74 cm. Pulmonic Valve: The pulmonic valve was normal in structure. Pulmonic valve regurgitation is mild. No evidence of pulmonic stenosis. Aorta: Aortic dilatation noted. There is moderate dilatation of the ascending aorta, measuring 42 mm. Venous: The inferior vena cava is dilated in size with less than 50% respiratory variability, suggesting right atrial pressure of 15 mmHg. IAS/Shunts: No atrial level shunt detected by color flow Doppler.  LEFT VENTRICLE PLAX 2D LVIDd:         4.00 cm      Diastology LVIDs:         3.40 cm      LV e' medial:    2.53 cm/s LV PW:         1.60 cm      LV E/e' medial:  77.1 LV IVS:        1.20 cm      LV e' lateral:   5.96 cm/s LVOT diam:     2.10 cm      LV E/e' lateral: 32.7 LV SV:         84 LV SV Index:   44 LVOT Area:     3.46 cm  LV Volumes (MOD) LV vol d, MOD A2C: 141.0 ml LV vol d, MOD A4C: 125.0 ml LV vol s, MOD A2C: 82.3 ml LV vol s, MOD A4C: 62.6 ml LV SV MOD A2C:     58.7 ml LV SV MOD A4C:     125.0 ml LV SV MOD BP:      64.7 ml RIGHT VENTRICLE RV S prime:     14.70 cm/s TAPSE (M-mode):  2.2 cm LEFT ATRIUM             Index        RIGHT ATRIUM           Index LA diam:        4.00 cm 2.10 cm/m   RA Area:     23.90 cm LA Vol (A2C):   74.6 ml 39.14 ml/m  RA Volume:   78.10 ml  40.98 ml/m LA Vol (A4C):   78.1 ml 40.98 ml/m LA Biplane Vol: 78.6 ml 41.24 ml/m  AORTIC VALVE AV Area (Vmax):    1.76 cm AV Area (Vmean):   1.60 cm AV Area (VTI):     1.74 cm AV Vmax:           217.00 cm/s AV Vmean:          158.500 cm/s AV VTI:            0.486 m AV Peak Grad:      18.8 mmHg AV Mean Grad:      11.5 mmHg LVOT Vmax:         110.00 cm/s LVOT Vmean:        73.200 cm/s LVOT VTI:          0.244 m LVOT/AV VTI ratio: 0.50 AI PHT:            309 msec AR Vena Contracta: 0.30 cm  AORTA Ao Root diam: 3.50 cm Ao Asc diam:  4.20 cm MITRAL VALVE                  TRICUSPID VALVE MV Area (PHT): 3.31 cm       TR Peak grad:   22.8 mmHg MV Area VTI:   1.37 cm        TR Vmax:        239.00 cm/s MV Peak grad:  15.5 mmHg MV Mean grad:  5.0 mmHg       SHUNTS MV Vmax:       1.97 m/s       Systemic VTI:  0.24 m MV Vmean:      98.7 cm/s      Systemic Diam: 2.10 cm MV Decel Time: 229 msec MR Peak grad:    124.5 mmHg MR Mean grad:    77.0 mmHg MR Vmax:         558.00 cm/s MR Vmean:        412.0 cm/s MR PISA:         1.57 cm MR PISA Eff ROA: 11 mm MR PISA Radius:  0.50 cm MV E velocity: 195.00 cm/s MV A velocity: 78.00 cm/s MV E/A ratio:  2.50 Jenkins Rouge MD Electronically signed by Jenkins Rouge MD Signature Date/Time: 12/31/2021/12:03:38 PM    Final    MR BRAIN WO CONTRAST  Result Date: 12/31/2021 CLINICAL DATA:  Follow-up examination for stroke, left facial droop, slurred speech. EXAM: MRI HEAD WITHOUT CONTRAST TECHNIQUE: Multiplanar, multiecho pulse sequences of the brain and surrounding structures were obtained without intravenous contrast. COMPARISON:  Prior CT from earlier the same day as well as previous MRI from 01/04/2021. FINDINGS: Brain: Generalized age-related cerebral atrophy. Patchy and confluent T2/FLAIR hyperintensity involving the periventricular deep white matter both cerebral hemispheres, most consistent with chronic small vessel ischemic disease. Few scattered remote lacunar infarcts present about the hemispheric cerebral white matter and deep gray nuclei. Additional scattered small volume cerebellar infarcts, left greater than right. Small remote lacunar infarct noted at the  left pons. Frontal corona radiata, consistent with an acute ischemic infarct (series 9, image 76). No associated hemorrhage or mass effect. No other evidence for acute or subacute ischemia. Gray-white matter differentiation otherwise maintained. No mass lesion. Single punctate chronic microhemorrhage noted at the deep white matter of the posterior right frontoparietal centrum semi ovale, likely small vessel related. Bilateral subdural collections again noted, measuring up to 5 mm on the  right and 3 mm on the left, likely subacute subdural hematomas, also seen on prior CT. No significant mass effect or midline shift. No hydrocephalus. Basilar cisterns remain patent. Pituitary gland and suprasellar region within normal limits. Vascular: Major intracranial vascular flow voids are maintained. Skull and upper cervical spine: Craniocervical junction normal. Bone marrow signal intensity diffusely decreased on T1 weighted sequence, nonspecific, but most commonly related to anemia, smoking or obesity. No focal marrow replacing lesion. No scalp soft tissue abnormality. Sinuses/Orbits: Globes and orbital soft tissues within normal limits. Scattered mucosal thickening noted within the ethmoidal air cells and maxillary sinuses. No mastoid effusion. Other: None. IMPRESSION: 1. Small acute ischemic nonhemorrhagic infarct involving the posterior right frontal corona radiata. No associated mass effect. 2. Small bilateral subdural collections, likely subacute hematomas, right slightly larger than left. No significant mass effect or midline shift. 3. Underlying atrophy with moderate chronic microvascular ischemic disease, with multiple remote lacunar infarcts as above. Electronically Signed   By: Jeannine Boga M.D.   On: 12/31/2021 01:28   CT HEAD CODE STROKE WO CONTRAST  Result Date: 12/30/2021 CLINICAL DATA:  Code stroke.  Left facial droop. EXAM: CT HEAD WITHOUT CONTRAST TECHNIQUE: Contiguous axial images were obtained from the base of the skull through the vertex without intravenous contrast. RADIATION DOSE REDUCTION: This exam was performed according to the departmental dose-optimization program which includes automated exposure control, adjustment of the mA and/or kV according to patient size and/or use of iterative reconstruction technique. COMPARISON:  MR head without contrast 01/04/2021. CT head without contrast 01/03/2021 FINDINGS: Brain: Remote lacunar infarct of the left centrum semi ovale is  noted. Remote lacunar infarcts of the right caudate head are also noted. A remote lacunar infarct is again noted within the right corona radiata. Remote lacunar infarcts are present in the cerebellum bilaterally. No acute cortical infarct or hemorrhage is present. Basal ganglia are otherwise intact. Insular ribbon is normal bilaterally. No acute infarct parenchymal hemorrhage is present. The ventricles are of normal size. No extra-axial collections are slightly larger on the right. Right-sided collection measures up to 5 mm and is of intermediate density. No significant mass effect or midline shift is present. A prominent cortical vein is noted over the right central sulcus. Dural sinuses are within normal limits. Vascular: Dense atherosclerotic calcifications are present within the cavernous internal carotid arteries and at the dural margin both vertebral arteries. No hyperdense vessel is present. Skull: Calvarium is intact. No focal lytic or blastic lesions are present. No significant extracranial soft tissue lesion is present. Sinuses/Orbits: The paranasal sinuses and mastoid air cells are clear. The globes and orbits are within normal limits. ASPECTS [facility] Stroke Program Early CT Score) - Ganglionic level infarction (caudate, lentiform nuclei, internal capsule, insula, M1-M3 cortex): 7/7 - Supraganglionic infarction (M4-M6 cortex): 3/3 Total score (0-10 with 10 being normal): 10/10 IMPRESSION: 1. No definite acute infarct.  Aspects 10/10. 2. New intermediate density extra-axial collections likely represent subacute small subdural hematoma is measuring up to 5 mm on the right. No significant mass effect or midline shift is present. 3.  Remote lacunar infarcts involving the left centrum semi ovale, right caudate head, and right corona radiata. 4. Remote lacunar infarcts of the cerebellum bilaterally. 5. Atherosclerosis. The above was relayed via text pager to [place] on 12/30/2021 at 13:32 . Electronically  Signed   By: San Morelle M.D.   On: 12/30/2021 13:32     PERTINENT LAB RESULTS: CBC: Recent Labs    12/30/21 1311 12/30/21 1316  WBC 5.4  --   HGB 12.2 11.9*  HCT 36.2 35.0*  PLT 133*  --     CMET CMP     Component Value Date/Time   NA 138 12/30/2021 1316   NA 142 10/10/2021 1047   K 4.2 12/30/2021 1316   CL 98 12/30/2021 1316   CO2 28 12/30/2021 1311   GLUCOSE 67 (L) 12/30/2021 1316   BUN 29 (H) 12/30/2021 1316   BUN 28 (H) 10/10/2021 1047   CREATININE 1.40 (H) 12/30/2021 1316   CREATININE 1.32 (H) 11/02/2020 0859   CALCIUM 9.2 12/30/2021 1311   PROT 6.3 (L) 12/30/2021 1311   PROT 6.6 10/10/2021 1047   ALBUMIN 3.7 12/30/2021 1311   ALBUMIN 4.2 10/10/2021 1047   AST 21 12/30/2021 1311   ALT 12 12/30/2021 1311   ALKPHOS 46 12/30/2021 1311   BILITOT 1.6 (H) 12/30/2021 1311   BILITOT 1.4 (H) 10/10/2021 1047   GFRNONAA 41 (L) 12/30/2021 1311   GFRAA 57 (L) 03/16/2020 1435    GFR Estimated Creatinine Clearance: 41.3 mL/min (A) (by C-G formula based on SCr of 1.4 mg/dL (H)). No results for input(s): "LIPASE", "AMYLASE" in the last 72 hours. No results for input(s): "CKTOTAL", "CKMB", "CKMBINDEX", "TROPONINI" in the last 72 hours. Invalid input(s): "POCBNP" No results for input(s): "DDIMER" in the last 72 hours. No results for input(s): "HGBA1C" in the last 72 hours. Recent Labs    12/31/21 0615  CHOL 187  HDL 45  LDLCALC 127*  TRIG 73  CHOLHDL 4.2    Recent Labs    12/31/21 0615  TSH 3.386    No results for input(s): "VITAMINB12", "FOLATE", "FERRITIN", "TIBC", "IRON", "RETICCTPCT" in the last 72 hours. Coags: Recent Labs    12/30/21 1311  INR 1.1    Microbiology: No results found for this or any previous visit (from the past 240 hour(s)).  FURTHER DISCHARGE INSTRUCTIONS:  Get Medicines reviewed and adjusted: Please take all your medications with you for your next visit with your Primary MD  Laboratory/radiological data: Please  request your Primary MD to go over all hospital tests and procedure/radiological results at the follow up, please ask your Primary MD to get all Hospital records sent to his/her office.  In some cases, they will be blood work, cultures and biopsy results pending at the time of your discharge. Please request that your primary care M.D. goes through all the records of your hospital data and follows up on these results.  Also Note the following: If you experience worsening of your admission symptoms, develop shortness of breath, life threatening emergency, suicidal or homicidal thoughts you must seek medical attention immediately by calling 911 or calling your MD immediately  if symptoms less severe.  You must read complete instructions/literature along with all the possible adverse reactions/side effects for all the Medicines you take and that have been prescribed to you. Take any new Medicines after you have completely understood and accpet all the possible adverse reactions/side effects.   Do not drive when taking Pain medications or sleeping medications (Benzodaizepines)  Do not take more than prescribed Pain, Sleep and Anxiety Medications. It is not advisable to combine anxiety,sleep and pain medications without talking with your primary care practitioner  Special Instructions: If you have smoked or chewed Tobacco  in the last 2 yrs please stop smoking, stop any regular Alcohol  and or any Recreational drug use.  Wear Seat belts while driving.  Please note: You were cared for by a hospitalist during your hospital stay. Once you are discharged, your primary care physician will handle any further medical issues. Please note that NO REFILLS for any discharge medications will be authorized once you are discharged, as it is imperative that you return to your primary care physician (or establish a relationship with a primary care physician if you do not have one) for your post hospital discharge needs  so that they can reassess your need for medications and monitor your lab values.  Total Time spent coordinating discharge including counseling, education and face to face time equals greater than 30 minutes.  SignedOren Binet 12/31/2021 2:35 PM

## 2021-12-31 NOTE — Progress Notes (Addendum)
STROKE TEAM PROGRESS NOTE   INTERVAL HISTORY Patient is neurologically at baseline. She has not been on eliquis and denies taking aspirin daily. Recommend ASA '81mg'$  and Plavix '75mg'$  and then ASA alone.  Hemodynamically stable with no follow up recommend from PT.  MRI scan shows a left coronary radiata infarct likely from small vessel disease.  Carotid ultrasound shows no significant extracranial stenosis.  Transcranial Doppler study suboptimal due to poor windows but shows no large vessel stenosis.  Echocardiogram showed ejection fraction 55 to 60% with moderate mitral valve annular calcification.  There is dilatation of left atrium. Vitals:   12/31/21 0000 12/31/21 0400 12/31/21 0817 12/31/21 1244  BP: 129/77 (!) 145/85 125/79 (!) 137/53  Pulse: 78 96 71 72  Resp: '19 20 16 20  '$ Temp: 98.6 F (37 C)  98.4 F (36.9 C) (!) 97.4 F (36.3 C)  TempSrc: Oral  Oral Axillary  SpO2: 93% 96% 96% 96%  Weight:      Height:       CBC:  Recent Labs  Lab 12/30/21 1311 12/30/21 1316  WBC 5.4  --   NEUTROABS 3.7  --   HGB 12.2 11.9*  HCT 36.2 35.0*  MCV 85.2  --   PLT 133*  --    Basic Metabolic Panel:  Recent Labs  Lab 12/30/21 1311 12/30/21 1316  NA 140 138  K 4.2 4.2  CL 101 98  CO2 28  --   GLUCOSE 73 67*  BUN 27* 29*  CREATININE 1.41* 1.40*  CALCIUM 9.2  --    Lipid Panel:  Recent Labs  Lab 12/31/21 0615  CHOL 187  TRIG 73  HDL 45  CHOLHDL 4.2  VLDL 15  LDLCALC 127*   HgbA1c: No results for input(s): "HGBA1C" in the last 168 hours. Urine Drug Screen: No results for input(s): "LABOPIA", "COCAINSCRNUR", "LABBENZ", "AMPHETMU", "THCU", "LABBARB" in the last 168 hours.  Alcohol Level  Recent Labs  Lab 12/30/21 1311  ETH <10    IMAGING past 24 hours VAS [place] TRANSCRANIAL DOPPLER  Result Date: 12/31/2021  Transcranial Doppler Patient Name:  Cynthia Cook  Date of Exam:   12/31/2021 Medical Rec #: [id number]       Accession #:    [id number] Date of Birth: Aug 16, 1955         Patient Gender: F Patient Age:   66 years Exam Location:  [facility] Procedure:      VAS [place] TRANSCRANIAL DOPPLER Referring Phys: Janine Ores --------------------------------------------------------------------------------  Indications: Stroke. History: Atrial fibrillation, HTN, HLD, Not on anticoagulation. Limitations: Poor windows Limitations for diagnostic windows: Unable to insonate right transtemporal window. Comparison Study: No prior study on file Performing Technologist: Sharion Dove RVS  Examination Guidelines: A complete evaluation includes B-mode imaging, spectral Doppler, color Doppler, and power Doppler as needed of all accessible portions of each vessel. Bilateral testing is considered an integral part of a complete examination. Limited examinations for reoccurring indications may be performed as noted.  +----------+-------------+----------+-----------+------------------+ RIGHT TCD Right VM (cm)Depth (cm)Pulsatility     Comment       +----------+-------------+----------+-----------+------------------+ MCA           32.00                 1.87                       +----------+-------------+----------+-----------+------------------+ ACA  unable to insonate +----------+-------------+----------+-----------+------------------+ Term ICA                                    unable to insonate +----------+-------------+----------+-----------+------------------+ PCA                                         unable to insonate +----------+-------------+----------+-----------+------------------+ Opthalmic     46.00                 1.94                       +----------+-------------+----------+-----------+------------------+ ICA siphon                                  unable to insonate +----------+-------------+----------+-----------+------------------+ Vertebral     35.40                 1.27                        +----------+-------------+----------+-----------+------------------+  +----------+------------+----------+-----------+------------------+ LEFT TCD  Left VM (cm)Depth (cm)Pulsatility     Comment       +----------+------------+----------+-----------+------------------+ MCA          26.80                 1.54                       +----------+------------+----------+-----------+------------------+ ACA                                        unable to insonate +----------+------------+----------+-----------+------------------+ Term ICA     22.50                 0.97                       +----------+------------+----------+-----------+------------------+ PCA                                        unable to insonate +----------+------------+----------+-----------+------------------+ Opthalmic    43.00                 1.31                       +----------+------------+----------+-----------+------------------+ ICA siphon                                 unable to insonate +----------+------------+----------+-----------+------------------+ Vertebral    -83.00                2.03                       +----------+------------+----------+-----------+------------------+  +------------+------+-------+             VM cm Comment +------------+------+-------+ Dist Basilar-47.60 1.48   +------------+------+-------+    Preliminary    VAS US CAROTID  Result Date: 12/31/2021 Carotid Arterial Duplex Study Patient Name:  Cynthia Cook  Date of Exam:   12/31/2021 Medical Rec #: [id number]       Accession #:    [id number] Date of Birth: 06-05-1955        Patient Gender: F Patient Age:   66 years Exam Location:  [facility] Procedure:      VAS US CAROTID Referring Phys: Wynetta Fines --------------------------------------------------------------------------------  Indication: CVA Risk Factors:      Hypertension, hyperlipidemia. Other Factors:     Paroxysmal atrial fibrillation,  not on anticoagulation. Comparison Study:  Prior carotid duplex indicating bilateral 1-39% ICA stenosis                    done 01/04/21 Performing Technologist: Sharion Dove RVS  Examination Guidelines: A complete evaluation includes B-mode imaging, spectral Doppler, color Doppler, and power Doppler as needed of all accessible portions of each vessel. Bilateral testing is considered an integral part of a complete examination. Limited examinations for reoccurring indications may be performed as noted.  Right Carotid Findings: +----------+--------+--------+--------+------------------+--------+           PSV cm/sEDV cm/sStenosisPlaque DescriptionComments +----------+--------+--------+--------+------------------+--------+ CCA Prox  93      14                                         +----------+--------+--------+--------+------------------+--------+ CCA Distal101     14              calcific                   +----------+--------+--------+--------+------------------+--------+ ICA Prox  195     33      1-39%   heterogenous               +----------+--------+--------+--------+------------------+--------+ ICA Mid   179     29                                         +----------+--------+--------+--------+------------------+--------+ ICA Distal123     22                                         +----------+--------+--------+--------+------------------+--------+ ECA       106     11                                         +----------+--------+--------+--------+------------------+--------+ +----------+--------+-------+--------+-------------------+           PSV cm/sEDV cmsDescribeArm Pressure (mmHG) +----------+--------+-------+--------+-------------------+ [id number]                                        +----------+--------+-------+--------+-------------------+ +---------+--------+--+--------+--+ VertebralPSV cm/s54EDV cm/s10  +---------+--------+--+--------+--+  Left Carotid Findings: +----------+--------+--------+--------+------------------+--------+           PSV cm/sEDV cm/sStenosisPlaque DescriptionComments +----------+--------+--------+--------+------------------+--------+ CCA Prox  85      9               calcific                   +----------+--------+--------+--------+------------------+--------+ CCA Distal88  13              heterogenous               +----------+--------+--------+--------+------------------+--------+ ICA Prox  78      13      1-39%   heterogenous               +----------+--------+--------+--------+------------------+--------+ ICA Mid   97      15                                         +----------+--------+--------+--------+------------------+--------+ ICA Distal82      22                                tortuous +----------+--------+--------+--------+------------------+--------+ ECA       86      5                                          +----------+--------+--------+--------+------------------+--------+ +----------+--------+--------+--------+-------------------+           PSV cm/sEDV cm/sDescribeArm Pressure (mmHG) +----------+--------+--------+--------+-------------------+ [id number]                                          +----------+--------+--------+--------+-------------------+ +---------+--------+--+--------+--+ VertebralPSV cm/s98EDV cm/s28 +---------+--------+--+--------+--+   Summary: Right Carotid: Velocities in the right ICA are consistent with a 1-39% stenosis. Left Carotid: Velocities in the left ICA are consistent with a 1-39% stenosis. Vertebrals:  Bilateral vertebral arteries demonstrate antegrade flow. Subclavians: Normal flow hemodynamics were seen in bilateral subclavian              arteries. *See table(s) above for measurements and observations.  Electronically signed by Antony Contras MD on 12/31/2021 at 12:44:04 PM.     Final    ECHOCARDIOGRAM COMPLETE  Result Date: 12/31/2021    ECHOCARDIOGRAM REPORT   Patient Name:   DANEILLE DESILVA Date of Exam: 12/31/2021 Medical Rec #:  [id number]      Height:       66.0 in Accession #:    [id number]     Weight:       178.6 lb Date of Birth:  28-Oct-1955       BSA:          1.906 m Patient Age:    15 years       BP:           125/79 mmHg Patient Gender: F              HR:           67 bpm. Exam Location:  Inpatient Procedure: 2D Echo, Cardiac Doppler and Color Doppler Indications:    Stroke I63.9  History:        Patient has prior history of Echocardiogram examinations, most                 recent 01/04/2021. Arrythmias:Atrial Fibrillation.  Sonographer:    Bernadene Person RDCS Referring Phys: [id number] [place]  1. Left ventricular ejection fraction, by estimation, is 55 to 60%. The left ventricle has normal function. The left ventricle has no regional wall motion abnormalities.  There is mild left ventricular hypertrophy. Left ventricular diastolic parameters are indeterminate.  2. Right ventricular systolic function is normal. The right ventricular size is normal. There is normal pulmonary artery systolic pressure.  3. Left atrial size was moderately dilated.  4. Right atrial size was moderately dilated.  5. Severe bulky posterior annular calcification mean diastolic gradient 5 mmHg but MVA estimated at > 2 cm2 . The mitral valve is degenerative. Moderate mitral valve regurgitation. No evidence of mitral stenosis. Severe mitral annular calcification.  6. The aortic valve is tricuspid. There is moderate calcification of the aortic valve. There is moderate thickening of the aortic valve. Aortic valve regurgitation is mild. Mild aortic valve stenosis.  7. Aortic dilatation noted. There is moderate dilatation of the ascending aorta, measuring 42 mm.  8. The inferior vena cava is dilated in size with <50% respiratory variability, suggesting right atrial pressure of 15 mmHg. FINDINGS   Left Ventricle: Left ventricular ejection fraction, by estimation, is 55 to 60%. The left ventricle has normal function. The left ventricle has no regional wall motion abnormalities. The left ventricular internal cavity size was normal in size. There is  mild left ventricular hypertrophy. Left ventricular diastolic parameters are indeterminate. Right Ventricle: The right ventricular size is normal. No increase in right ventricular wall thickness. Right ventricular systolic function is normal. There is normal pulmonary artery systolic pressure. The tricuspid regurgitant velocity is 2.39 m/s, and  with an assumed right atrial pressure of 8 mmHg, the estimated right ventricular systolic pressure is 50.5 mmHg. Left Atrium: Left atrial size was moderately dilated. Right Atrium: Right atrial size was moderately dilated. Pericardium: There is no evidence of pericardial effusion. Mitral Valve: Severe bulky posterior annular calcification mean diastolic gradient 5 mmHg but MVA estimated at > 2 cm2. The mitral valve is degenerative in appearance. There is severe thickening of the mitral valve leaflet(s). There is severe calcification of the mitral valve leaflet(s). Severe mitral annular calcification. Moderate mitral valve regurgitation. No evidence of mitral valve stenosis. MV peak gradient, 15.5 mmHg. The mean mitral valve gradient is 5.0 mmHg. Tricuspid Valve: The tricuspid valve is normal in structure. Tricuspid valve regurgitation is mild . No evidence of tricuspid stenosis. Aortic Valve: The aortic valve is tricuspid. There is moderate calcification of the aortic valve. There is moderate thickening of the aortic valve. Aortic valve regurgitation is mild. Aortic regurgitation PHT measures 309 msec. Mild aortic stenosis is present. Aortic valve mean gradient measures 11.5 mmHg. Aortic valve peak gradient measures 18.8 mmHg. Aortic valve area, by VTI measures 1.74 cm. Pulmonic Valve: The pulmonic valve was normal in  structure. Pulmonic valve regurgitation is mild. No evidence of pulmonic stenosis. Aorta: Aortic dilatation noted. There is moderate dilatation of the ascending aorta, measuring 42 mm. Venous: The inferior vena cava is dilated in size with less than 50% respiratory variability, suggesting right atrial pressure of 15 mmHg. IAS/Shunts: No atrial level shunt detected by color flow Doppler.  LEFT VENTRICLE PLAX 2D LVIDd:         4.00 cm      Diastology LVIDs:         3.40 cm      LV e' medial:    2.53 cm/s LV PW:         1.60 cm      LV E/e' medial:  77.1 LV IVS:        1.20 cm      LV e' lateral:   5.96 cm/s LVOT diam:  2.10 cm      LV E/e' lateral: 32.7 LV SV:         84 LV SV Index:   44 LVOT Area:     3.46 cm  LV Volumes (MOD) LV vol d, MOD A2C: 141.0 ml LV vol d, MOD A4C: 125.0 ml LV vol s, MOD A2C: 82.3 ml LV vol s, MOD A4C: 62.6 ml LV SV MOD A2C:     58.7 ml LV SV MOD A4C:     125.0 ml LV SV MOD BP:      64.7 ml RIGHT VENTRICLE RV S prime:     14.70 cm/s TAPSE (M-mode): 2.2 cm LEFT ATRIUM             Index        RIGHT ATRIUM           Index LA diam:        4.00 cm 2.10 cm/m   RA Area:     23.90 cm LA Vol (A2C):   74.6 ml 39.14 ml/m  RA Volume:   78.10 ml  40.98 ml/m LA Vol (A4C):   78.1 ml 40.98 ml/m LA Biplane Vol: 78.6 ml 41.24 ml/m  AORTIC VALVE AV Area (Vmax):    1.76 cm AV Area (Vmean):   1.60 cm AV Area (VTI):     1.74 cm AV Vmax:           217.00 cm/s AV Vmean:          158.500 cm/s AV VTI:            0.486 m AV Peak Grad:      18.8 mmHg AV Mean Grad:      11.5 mmHg LVOT Vmax:         110.00 cm/s LVOT Vmean:        73.200 cm/s LVOT VTI:          0.244 m LVOT/AV VTI ratio: 0.50 AI PHT:            309 msec AR Vena Contracta: 0.30 cm  AORTA Ao Root diam: 3.50 cm Ao Asc diam:  4.20 cm MITRAL VALVE                  TRICUSPID VALVE MV Area (PHT): 3.31 cm       TR Peak grad:   22.8 mmHg MV Area VTI:   1.37 cm       TR Vmax:        239.00 cm/s MV Peak grad:  15.5 mmHg MV Mean grad:  5.0 mmHg        SHUNTS MV Vmax:       1.97 m/s       Systemic VTI:  0.24 m MV Vmean:      98.7 cm/s      Systemic Diam: 2.10 cm MV Decel Time: 229 msec MR Peak grad:    124.5 mmHg MR Mean grad:    77.0 mmHg MR Vmax:         558.00 cm/s MR Vmean:        412.0 cm/s MR PISA:         1.57 cm MR PISA Eff ROA: 11 mm MR PISA Radius:  0.50 cm MV E velocity: 195.00 cm/s MV A velocity: 78.00 cm/s MV E/A ratio:  2.50 Jenkins Rouge MD Electronically signed by Jenkins Rouge MD Signature Date/Time: 12/31/2021/12:03:38 PM    Final    MR BRAIN WO CONTRAST  Result  Date: 12/31/2021 CLINICAL DATA:  Follow-up examination for stroke, left facial droop, slurred speech. EXAM: MRI HEAD WITHOUT CONTRAST TECHNIQUE: Multiplanar, multiecho pulse sequences of the brain and surrounding structures were obtained without intravenous contrast. COMPARISON:  Prior CT from earlier the same day as well as previous MRI from 01/04/2021. FINDINGS: Brain: Generalized age-related cerebral atrophy. Patchy and confluent T2/FLAIR hyperintensity involving the periventricular deep white matter both cerebral hemispheres, most consistent with chronic small vessel ischemic disease. Few scattered remote lacunar infarcts present about the hemispheric cerebral white matter and deep gray nuclei. Additional scattered small volume cerebellar infarcts, left greater than right. Small remote lacunar infarct noted at the left pons. Frontal corona radiata, consistent with an acute ischemic infarct (series 9, image 76). No associated hemorrhage or mass effect. No other evidence for acute or subacute ischemia. Gray-white matter differentiation otherwise maintained. No mass lesion. Single punctate chronic microhemorrhage noted at the deep white matter of the posterior right frontoparietal centrum semi ovale, likely small vessel related. Bilateral subdural collections again noted, measuring up to 5 mm on the right and 3 mm on the left, likely subacute subdural hematomas, also seen on prior CT.  No significant mass effect or midline shift. No hydrocephalus. Basilar cisterns remain patent. Pituitary gland and suprasellar region within normal limits. Vascular: Major intracranial vascular flow voids are maintained. Skull and upper cervical spine: Craniocervical junction normal. Bone marrow signal intensity diffusely decreased on T1 weighted sequence, nonspecific, but most commonly related to anemia, smoking or obesity. No focal marrow replacing lesion. No scalp soft tissue abnormality. Sinuses/Orbits: Globes and orbital soft tissues within normal limits. Scattered mucosal thickening noted within the ethmoidal air cells and maxillary sinuses. No mastoid effusion. Other: None. IMPRESSION: 1. Small acute ischemic nonhemorrhagic infarct involving the posterior right frontal corona radiata. No associated mass effect. 2. Small bilateral subdural collections, likely subacute hematomas, right slightly larger than left. No significant mass effect or midline shift. 3. Underlying atrophy with moderate chronic microvascular ischemic disease, with multiple remote lacunar infarcts as above. Electronically Signed   By: Jeannine Boga M.D.   On: 12/31/2021 01:28    PHYSICAL EXAM  Physical Exam  Constitutional: Appears well-developed and well-nourished middle-age Caucasian lady not in distress.   Cardiovascular: Normal rate and regular rhythm.  Respiratory: Effort normal, non-labored breathing  Neuro: Mental Status: Patient is awake, alert, oriented to person, place, month, year, and situation. Patient is able to give a clear and coherent history. No signs of aphasia or neglect Cranial Nerves: II: Visual Fields are full. Pupils are equal, round, and reactive to light.   III,IV, VI: EOMI without ptosis or diploplia.  V: Facial sensation is symmetric to temperature VII: Facial movement is symmetric resting and smiling VIII: Hearing is intact to voice X: Palate elevates symmetrically XI: Shoulder shrug  is symmetric. XII: Tongue protrudes midline without atrophy or fasciculations.  Motor: Tone is normal. Bulk is normal. 5/5 strength was present in all four extremities.  Sensory: Sensation is symmetric to light touch and temperature in the arms and legs. No extinction to DSS present.  Cerebellar: FNF and HKS are intact bilaterally    ASSESSMENT/PLAN [name] is a 66 y.o. female with history of stroke in 2022 with residual dysarthria, PAF not on anticoagulation, remote PE off Coumadin, hypothyroidism, gout, presented with new onset of one-sided face numbness and transient expressive aphasia. Symptoms have resolved. Plan to discharge on ASA and plavix for 3 weeks and then aspirin alone. Recommend follow up with cardiology  and neurology outpatient as she is established with both.   Stroke:  Left corona radiata lacunar infarct Etiology:  Small vessel disease Patient has history of A-fib but has been not able to afford her co-pay for anticoagulation with NOACs and she is refusing warfarin Code Stroke CT head  New intermediate density extra-axial collections likely represent subacute small subdural hematoma is measuring up to 5 mm on the right. No significant mass effect or midline shift is present. Remote lacunar infarcts involving the left centrum semi ovale, right caudate head, and right corona radiata. Remote lacunar infarcts of the cerebellum bilaterally. MRI  Small acute ischemic nonhemorrhagic infarct involving the posterior right frontal corona radiata. No associated mass effect. Small bilateral subdural collections, likely subacute hematomas, right slightly larger than left.  Carotid Doppler  ICA 1-39% stenosis, bil vertebral arteries antegrade flow 2D Echo 55-60% with mitral valve calcification  TCD suboptimal due to poor bony windows but no large vessel stenosis.    LDL 127 HgbA1c 5.2 VTE prophylaxis - Lovenox    Diet   Diet Heart Room service appropriate? Yes; Fluid  consistency: Thin   No antithrombotic prior to admission, now on aspirin 81 mg daily and clopidogrel 75 mg daily for 3 weeks and and then Aspirin '81mg'$   Therapy recommendations:  No follow up  Disposition:  home  Hypertension Home meds:  none Stable Permissive hypertension (OK if < 220/120) but gradually normalize in 5-7 days Long-term BP goal normotensive  Hyperlipidemia LDL 127, goal < 70 Add atorvastatin '40mg'$   Continue statin at discharge  Other Stroke Risk Factors Advanced Age >/= 47  Hx of stroke  left centrum semiovale infarct lacunar secondary to small vessel disease and a. Fib not on anticoagulation  Other Active Problems Atrial fibrillation  Not anticoagulation due to cost, patient is recommended to be on Eliquis. She states that she does not tolerate coumadin well. She does follow with [facility] and cardiology outpatient for management as well.   Hospital day # 0  Patient seen and examined by NP/APP with MD. MD to update note as needed.   Janine Ores, DNP, FNP-BC [facility] Pager: [phone]  I have personally obtained history,examined this patient, reviewed notes, independently viewed imaging studies, participated in medical decision making and plan of care.ROS completed by me personally and pertinent positives fully documented  I have made any additions or clarifications directly to the above note. Agree with note above.  Patient known to me from previous admission for stroke at which time she was started on Eliquis but she took it only for 5 months and was unable to afford the co-pay and hence she stopped it.  This time she presented with transient episode of slurred speech and paresthesias on her face and MRI shows small left corona radiata infarct likely from small vessel disease.  Patient was counseled to be compliant with her antiplatelet medications as well as aggressive risk factor modification.  Patient is clear she cannot afford the co-pay for her  Eliquis and does not want warfarin instead hence recommend aspirin and Plavix for 3 weeks followed by aspirin alone.  She was counseled for aggressive risk factor modification.  Trial of Zetia at least if she has statin intolerance.  Follow-up in the stroke clinic as an outpatient.  Greater than 50% time during this 50-minute visit was spent in counseling and coordination of care and discussion about her stroke risk and risk benefit of anticoagulation with warfarin versus NOACs and answering questions. Discussed with  Dr.Ghimire and answered questions Antony Contras, MD Medical Director [facility] Pager: [phone] 12/31/2021 2:40 PM   To contact Stroke Continuity provider, please refer to [email or web address] After hours, contact General Neurology

## 2021-12-31 NOTE — Evaluation (Signed)
Physical Therapy Evaluation Patient Details Name: Cynthia Cook MRN: [id number] DOB: 11-08-1955 Today's Date: 12/31/2021  History of Present Illness  Pt presented to ED on 12/30/21 with dysarthria and lt facial droop. Pt found to have acute infarct of rt frontal corona radiata. PMH - CVA, PAF not on anticoagulation, gout, PE  Clinical Impression  Pt presents to PT at baseline with her mobility. Pt tends to not use quad cane correctly but this is her baseline and she has been managing at home. She isn't interested in any other type of assistive device. No further PT needs.        Recommendations for follow up therapy are one component of a multi-disciplinary discharge planning process, led by the attending physician.  Recommendations may be updated based on patient status, additional functional criteria and insurance authorization.  Follow Up Recommendations No PT follow up      Assistance Recommended at Discharge PRN  Patient can return home with the following       Equipment Recommendations None recommended by PT  Recommendations for Other Services       Functional Status Assessment Patient has not had a recent decline in their functional status     Precautions / Restrictions Precautions Precautions: Fall Restrictions Weight Bearing Restrictions: No      Mobility  Bed Mobility               General bed mobility comments: Up with OT    Transfers Overall transfer level: Needs assistance Equipment used: None, Quad cane Transfers: Sit to/from Stand Sit to Stand: Supervision                Ambulation/Gait Ambulation/Gait assistance: Supervision, Min guard, Min assist Gait Distance (Feet): 200 Feet Assistive device: Quad cane Gait Pattern/deviations: Step-through pattern, Staggering right Gait velocity: decr Gait velocity interpretation: 1.31 - 2.62 ft/sec, indicative of limited community ambulator   General Gait Details: Supervision/min guard for safety  and 1 loss of balance when distracted in hallway requiring min assist to correct. Pt tends to drag the quad cane along versus using it for stability. This is baseline per prior encounters. Pt does not want to use any other assistive device.  Stairs            Wheelchair Mobility    Modified Rankin (Stroke Patients Only)       Balance Overall balance assessment: Needs assistance Sitting-balance support: Feet supported Sitting balance-Leahy Scale: Good     Standing balance support: Single extremity supported, No upper extremity supported Standing balance-Leahy Scale: Fair Standing balance comment: doesn't use quad cane appropriately.                             Pertinent Vitals/Pain Pain Assessment Pain Assessment: No/denies pain    Home Living Family/patient expects to be discharged to:: Private residence Living Arrangements: Alone Available Help at Discharge:  (none) Type of Home: Apartment Home Access: Level entry       Home Layout: One level Home Equipment: Cane - quad      Prior Function Prior Level of Function : Driving;Independent/Modified Independent             Mobility Comments: Uses quad cane when out. Usually no device in her home ADLs Comments: patient stating she completed all of her own ADL, iADL.  Continues to drive.     Hand Dominance   Dominant Hand: Right    Extremity/Trunk Assessment  Upper Extremity Assessment Upper Extremity Assessment: Defer to OT evaluation    Lower Extremity Assessment Lower Extremity Assessment: Generalized weakness    Cervical / Trunk Assessment Cervical / Trunk Assessment: Normal  Communication   Communication: Expressive difficulties  Cognition Arousal/Alertness: Awake/alert Behavior During Therapy: WFL for tasks assessed/performed Overall Cognitive Status: Within Functional Limits for tasks assessed                                          General Comments       Exercises     Assessment/Plan    PT Assessment Patient does not need any further PT services  PT Problem List         PT Treatment Interventions      PT Goals (Current goals can be found in the Care Plan section)  Acute Rehab PT Goals PT Goal Formulation: All assessment and education complete, DC therapy    Frequency       Co-evaluation               AM-PAC PT "6 Clicks" Mobility  Outcome Measure Help needed turning from your back to your side while in a flat bed without using bedrails?: None Help needed moving from lying on your back to sitting on the side of a flat bed without using bedrails?: None Help needed moving to and from a bed to a chair (including a wheelchair)?: None Help needed standing up from a chair using your arms (e.g., wheelchair or bedside chair)?: None Help needed to walk in hospital room?: A Little Help needed climbing 3-5 steps with a railing? : A Little 6 Click Score: 22    End of Session   Activity Tolerance: Patient tolerated treatment well Patient left: in bed;with call bell/phone within reach;with nursing/sitter in room   PT Visit Diagnosis: Unsteadiness on feet (R26.81)    Time: 3817-7116 PT Time Calculation (min) (ACUTE ONLY): 10 min   Charges:   PT Evaluation $PT Eval Low Complexity: 1 Low          [place] [facility] [place] 12/31/2021, 2:49 PM

## 2021-12-31 NOTE — Care Management (Signed)
Spoke w MD re [place] cost. Typical coverage for [facility] medicare is $45-50. This coincides with her stated cost of medication. She is not eligible for coverage coupons, as she has used them in the past. MD [place] on Plavix. No other TOC needs at this time

## 2021-12-31 NOTE — Plan of Care (Signed)

## 2021-12-31 NOTE — Progress Notes (Signed)
PT Cancellation Note  Patient Details Name: Cynthia Cook MRN: [id number] DOB: November 01, 1955   Cancelled Treatment:    Reason Eval/Treat Not Completed: Patient at procedure or test/unavailable. Will check back later.    [name] [facility] 12/31/2021, 12:29 PM [place] Office [phone]

## 2021-12-31 NOTE — Progress Notes (Signed)
Echocardiogram 2D Echocardiogram has been performed.  Cynthia Cook 12/31/2021, 11:58 AM

## 2021-12-31 NOTE — Progress Notes (Signed)
PROGRESS NOTE        PATIENT DETAILS Name: Cynthia Cook Age: 66 y.o. Sex: female Date of Birth: Dec 10, 1955 Admit Date: 12/30/2021 Admitting Physician Lequita Halt, MD WUJ:[id number], Sonia Side, NP  Brief Summary: Patient is a 66 y.o.  female PAF, CVA-not on anticoagulation-presented with dysarthria/left facial droop-found to have acute CVA.  See below for further details.  Significant events: 9/16>> evaluated at [facility] ED-possible TIA.  MRI-no acute CVA.  Echo EF 55-60%.  Discharged on aspirin/statin. 9/22>> admitted [facility]-acute CVA.    Significant studies: 9/22>> MRI brain: Small infarct involving right frontal corona radiator.  Small bilateral subdural collections-likely subacute hematomas. 9/23>> LDL 127  Significant microbiology data: None  Procedures: None  Consults: Neurology  Subjective: Continues to have dysarthria and left facial droop.  Objective: Vitals: Blood pressure 125/79, pulse 71, temperature 98.4 F (36.9 C), temperature source Oral, resp. rate 16, height '5\' 6"'$  (1.676 m), weight 81 kg, SpO2 96 %.   Exam: Gen Exam:Alert awake-not in any distress HEENT:atraumatic, normocephalic Chest: B/L clear to auscultation anteriorly CVS:S1S2 regular Abdomen:soft non tender, non distended Extremities:no edema Neurology: Dysarthric-slight left facial droop but otherwise nonfocal. Skin: no rash  Pertinent Labs/Radiology:    Latest Ref Rng & Units 12/30/2021    1:16 PM 12/30/2021    1:11 PM 10/10/2021   10:47 AM  CBC  WBC 4.0 - 10.5 K/uL  5.4  5.0   Hemoglobin 12.0 - 15.0 g/dL 11.9  12.2  12.4   Hematocrit 36.0 - 46.0 % 35.0  36.2  37.6   Platelets 150 - 400 K/uL  133  162     Lab Results  Component Value Date   NA 138 12/30/2021   K 4.2 12/30/2021   CL 98 12/30/2021   CO2 28 12/30/2021      Assessment/Plan: Acute CVA: Unclear whether this is from small vessel disease or A-fib.  Work-up in progress-awaiting carotid Doppler/echo.   Continues to have dysarthria and left facial droop but otherwise nonfocal.  Telemetry negative for A-fib overnight.  Will await further input from neurology.  Await evaluation by PT/OT.  Small bilateral subdural hematomas: Seen on MRI-we will await recommendations from neurology.  Paroxysmal atrial fibrillation: Maintaining sinus rhythm-initially claimed that she could not afford DOAC due to cost, but upon further discussion, she also has significant concerns regarding side effects (bruising/ecchymosis)-and also that DOACs level could not be monitored and could not be traversed.  Per history-she previously has been intolerant to Coumadin-"they could never get my levels therapeutic".  Appears that the patient has done some research and does not want to go on anticoagulation.  She is aware that she will likely have recurrent CVAs-that could be life-threatening/life disabling.  In any event-I will discuss with case management to see if her DOAC cost is still the same as last year ($47)-if somewhat cheaper-then she may elect to take it-though I doubt it.  Hypothyroidism: Continue current dose of Synthroid-repeat TSH in 6 to 8 weeks as minimally elevated.   HLD: Per nursing staff-has refused statin-she apparently is intolerant.  We will try Zetia.  CKD stage IIIa: At baseline  BMI: Estimated body mass index is 28.82 kg/m as calculated from the following:   Height as of this encounter: '5\' 6"'$  (1.676 m).   Weight as of this encounter: 81 kg.   Code status:   Code  Status: Full Code   DVT Prophylaxis: enoxaparin (LOVENOX) injection 40 mg Start: 12/30/21 1630    Family Communication: None at bedside   Disposition Plan: Status is: Observation The patient remains OBS appropriate and will d/c before 2 midnights.   Planned Discharge Destination:Home health when work-up complete.   Diet: Diet Order             Diet Heart Room service appropriate? Yes; Fluid consistency: Thin  Diet effective  now                     Antimicrobial agents: Anti-infectives (From admission, onward)    None        MEDICATIONS: Scheduled Meds:  aspirin EC  81 mg Oral Daily   atorvastatin  40 mg Oral Daily   enoxaparin (LOVENOX) injection  40 mg Subcutaneous Q24H   [START ON 01/02/2022] levothyroxine  50 mcg Oral Q M,W,F   levothyroxine  75 mcg Oral Once per day on Sun Tue Thu Sat   Continuous Infusions: PRN Meds:.acetaminophen **OR** acetaminophen (TYLENOL) oral liquid 160 mg/5 mL **OR** acetaminophen, hydrALAZINE, LORazepam, ondansetron, senna-docusate   I have personally reviewed following labs and imaging studies  LABORATORY DATA: CBC: Recent Labs  Lab 12/30/21 1311 12/30/21 1316  WBC 5.4  --   NEUTROABS 3.7  --   HGB 12.2 11.9*  HCT 36.2 35.0*  MCV 85.2  --   PLT 133*  --     Basic Metabolic Panel: Recent Labs  Lab 12/30/21 1311 12/30/21 1316  NA 140 138  K 4.2 4.2  CL 101 98  CO2 28  --   GLUCOSE 73 67*  BUN 27* 29*  CREATININE 1.41* 1.40*  CALCIUM 9.2  --     GFR: Estimated Creatinine Clearance: 41.3 mL/min (A) (by C-G formula based on SCr of 1.4 mg/dL (H)).  Liver Function Tests: Recent Labs  Lab 12/30/21 1311  AST 21  ALT 12  ALKPHOS 46  BILITOT 1.6*  PROT 6.3*  ALBUMIN 3.7   No results for input(s): "LIPASE", "AMYLASE" in the last 168 hours. No results for input(s): "AMMONIA" in the last 168 hours.  Coagulation Profile: Recent Labs  Lab 12/30/21 1311  INR 1.1    Cardiac Enzymes: No results for input(s): "CKTOTAL", "CKMB", "CKMBINDEX", "TROPONINI" in the last 168 hours.  BNP (last 3 results) No results for input(s): "PROBNP" in the last 8760 hours.  Lipid Profile: Recent Labs    12/31/21 0615  CHOL 187  HDL 45  LDLCALC 127*  TRIG 73  CHOLHDL 4.2    Thyroid Function Tests: Recent Labs    12/31/21 0615  TSH 3.386    Anemia Panel: No results for input(s): "VITAMINB12", "FOLATE", "FERRITIN", "TIBC", "IRON",  "RETICCTPCT" in the last 72 hours.  Urine analysis:    Component Value Date/Time   COLORURINE YELLOW 02/27/2020 0210   APPEARANCEUR CLEAR 02/27/2020 0210   LABSPEC 1.018 02/27/2020 0210   PHURINE 5.0 02/27/2020 0210   GLUCOSEU NEGATIVE 02/27/2020 0210   HGBUR NEGATIVE 02/27/2020 0210   BILIRUBINUR NEGATIVE 02/27/2020 0210   KETONESUR NEGATIVE 02/27/2020 0210   PROTEINUR 30 (A) 02/27/2020 0210   NITRITE NEGATIVE 02/27/2020 0210   LEUKOCYTESUR NEGATIVE 02/27/2020 0210    Sepsis Labs: Lactic Acid, Venous No results found for: "LATICACIDVEN"  MICROBIOLOGY: No results found for this or any previous visit (from the past 240 hour(s)).  RADIOLOGY STUDIES/RESULTS: MR BRAIN WO CONTRAST  Result Date: 12/31/2021 CLINICAL DATA:  Follow-up examination for stroke,  left facial droop, slurred speech. EXAM: MRI HEAD WITHOUT CONTRAST TECHNIQUE: Multiplanar, multiecho pulse sequences of the brain and surrounding structures were obtained without intravenous contrast. COMPARISON:  Prior CT from earlier the same day as well as previous MRI from 01/04/2021. FINDINGS: Brain: Generalized age-related cerebral atrophy. Patchy and confluent T2/FLAIR hyperintensity involving the periventricular deep white matter both cerebral hemispheres, most consistent with chronic small vessel ischemic disease. Few scattered remote lacunar infarcts present about the hemispheric cerebral white matter and deep gray nuclei. Additional scattered small volume cerebellar infarcts, left greater than right. Small remote lacunar infarct noted at the left pons. Frontal corona radiata, consistent with an acute ischemic infarct (series 9, image 76). No associated hemorrhage or mass effect. No other evidence for acute or subacute ischemia. Gray-white matter differentiation otherwise maintained. No mass lesion. Single punctate chronic microhemorrhage noted at the deep white matter of the posterior right frontoparietal centrum semi ovale, likely  small vessel related. Bilateral subdural collections again noted, measuring up to 5 mm on the right and 3 mm on the left, likely subacute subdural hematomas, also seen on prior CT. No significant mass effect or midline shift. No hydrocephalus. Basilar cisterns remain patent. Pituitary gland and suprasellar region within normal limits. Vascular: Major intracranial vascular flow voids are maintained. Skull and upper cervical spine: Craniocervical junction normal. Bone marrow signal intensity diffusely decreased on T1 weighted sequence, nonspecific, but most commonly related to anemia, smoking or obesity. No focal marrow replacing lesion. No scalp soft tissue abnormality. Sinuses/Orbits: Globes and orbital soft tissues within normal limits. Scattered mucosal thickening noted within the ethmoidal air cells and maxillary sinuses. No mastoid effusion. Other: None. IMPRESSION: 1. Small acute ischemic nonhemorrhagic infarct involving the posterior right frontal corona radiata. No associated mass effect. 2. Small bilateral subdural collections, likely subacute hematomas, right slightly larger than left. No significant mass effect or midline shift. 3. Underlying atrophy with moderate chronic microvascular ischemic disease, with multiple remote lacunar infarcts as above. Electronically Signed   By: Jeannine Boga M.D.   On: 12/31/2021 01:28   CT HEAD CODE STROKE WO CONTRAST  Result Date: 12/30/2021 CLINICAL DATA:  Code stroke.  Left facial droop. EXAM: CT HEAD WITHOUT CONTRAST TECHNIQUE: Contiguous axial images were obtained from the base of the skull through the vertex without intravenous contrast. RADIATION DOSE REDUCTION: This exam was performed according to the departmental dose-optimization program which includes automated exposure control, adjustment of the mA and/or kV according to patient size and/or use of iterative reconstruction technique. COMPARISON:  MR head without contrast 01/04/2021. CT head without  contrast 01/03/2021 FINDINGS: Brain: Remote lacunar infarct of the left centrum semi ovale is noted. Remote lacunar infarcts of the right caudate head are also noted. A remote lacunar infarct is again noted within the right corona radiata. Remote lacunar infarcts are present in the cerebellum bilaterally. No acute cortical infarct or hemorrhage is present. Basal ganglia are otherwise intact. Insular ribbon is normal bilaterally. No acute infarct parenchymal hemorrhage is present. The ventricles are of normal size. No extra-axial collections are slightly larger on the right. Right-sided collection measures up to 5 mm and is of intermediate density. No significant mass effect or midline shift is present. A prominent cortical vein is noted over the right central sulcus. Dural sinuses are within normal limits. Vascular: Dense atherosclerotic calcifications are present within the cavernous internal carotid arteries and at the dural margin both vertebral arteries. No hyperdense vessel is present. Skull: Calvarium is intact. No focal lytic or blastic  lesions are present. No significant extracranial soft tissue lesion is present. Sinuses/Orbits: The paranasal sinuses and mastoid air cells are clear. The globes and orbits are within normal limits. ASPECTS [facility] Stroke Program Early CT Score) - Ganglionic level infarction (caudate, lentiform nuclei, internal capsule, insula, M1-M3 cortex): 7/7 - Supraganglionic infarction (M4-M6 cortex): 3/3 Total score (0-10 with 10 being normal): 10/10 IMPRESSION: 1. No definite acute infarct.  Aspects 10/10. 2. New intermediate density extra-axial collections likely represent subacute small subdural hematoma is measuring up to 5 mm on the right. No significant mass effect or midline shift is present. 3. Remote lacunar infarcts involving the left centrum semi ovale, right caudate head, and right corona radiata. 4. Remote lacunar infarcts of the cerebellum bilaterally. 5. Atherosclerosis.  The above was relayed via text pager to [place] on 12/30/2021 at 13:32 . Electronically Signed   By: San Morelle M.D.   On: 12/30/2021 13:32     LOS: 0 days   Oren Binet, MD  [facility]    To contact the attending provider between 7A-7P or the covering provider during after hours 7P-7A, please log into the web site [email or web address] and access using universal [place] password for that web site. If you do not have the password, please call the hospital operator.  12/31/2021, 9:21 AM

## 2021-12-31 NOTE — Evaluation (Signed)
Occupational Therapy Evaluation Patient Details Name: Cynthia Cook MRN: [id number] DOB: [birth year] Today's Date: 12/31/2021   History of Present Illness Pt presented to ED on 12/30/21 with dysarthria and lt facial droop. Pt found to have acute infarct of rt frontal corona radiata. PMH - CVA, PAF not on anticoagulation, gout, PE   Clinical Impression   Patient admitted for the diagnosis above.  PTA she lives alone, used a quad cane for mobility in the community, and needed no assist with ADL/iADL, and continues to drive.  Patient is essentially at her baseline for ADL and in room mobility.  Her only deficit is residual garbled speech, but she is able to express her needs.  No further OT needs exist.  Patient is not interested in post acute rehab.          Recommendations for follow up therapy are one component of a multi-disciplinary discharge planning process, led by the attending physician.  Recommendations may be updated based on patient status, additional functional criteria and insurance authorization.   Follow Up Recommendations  No OT follow up    Assistance Recommended at Discharge None  Patient can return home with the following      Functional Status Assessment  Patient has not had a recent decline in their functional status  Equipment Recommendations  None recommended by OT    Recommendations for Other Services       Precautions / Restrictions Precautions Precautions: Fall Restrictions Weight Bearing Restrictions: No      Mobility Bed Mobility               General bed mobility comments: Sitting edge of bed    Transfers Overall transfer level: Needs assistance   Transfers: Sit to/from Stand Sit to Stand: Supervision                  Balance Overall balance assessment: Needs assistance Sitting-balance support: Feet supported Sitting balance-Leahy Scale: Good     Standing balance support: Single extremity supported Standing balance-Leahy  Scale: Fair Standing balance comment: doesn't use quad cane appropriately.                           ADL either performed or assessed with clinical judgement   ADL Overall ADL's : At baseline                                             Vision Baseline Vision/History: 0 No visual deficits Patient Visual Report: No change from baseline       Perception Perception Perception: Within Functional Limits   Praxis Praxis Praxis: Intact    Pertinent Vitals/Pain Pain Assessment Pain Assessment: No/denies pain     Hand Dominance Right   Extremity/Trunk Assessment Upper Extremity Assessment Upper Extremity Assessment: Overall WFL for tasks assessed   Lower Extremity Assessment Lower Extremity Assessment: Defer to PT evaluation   Cervical / Trunk Assessment Cervical / Trunk Assessment: Normal   Communication Communication Communication: Expressive difficulties   Cognition Arousal/Alertness: Awake/alert Behavior During Therapy: WFL for tasks assessed/performed Overall Cognitive Status: Within Functional Limits for tasks assessed                                       General Comments  VSS on RA    Exercises     Shoulder Instructions      Home Living Family/patient expects to be discharged to:: Private residence Living Arrangements: Alone   Type of Home: Apartment Home Access: Level entry     Home Layout: One level     Bathroom Shower/Tub: Teacher, early years/pre: Handicapped height     Home Equipment: [place] - quad          Prior Functioning/Environment Prior Level of Function : Driving;Independent/Modified Independent               ADLs Comments: patient stating she completed all of her own ADL, iADL.  Continues to drive.        OT Problem List: Impaired balance (sitting and/or standing)      OT Treatment/Interventions:      OT Goals(Current goals can be found in the care plan  section) Acute Rehab OT Goals Patient Stated Goal: Return home OT Goal Formulation: With patient Time For Goal Achievement: 01/06/22 Potential to Achieve Goals: Good  OT Frequency:      Co-evaluation              AM-PAC OT "6 Clicks" Daily Activity     Outcome Measure Help from another person eating meals?: None Help from another person taking care of personal grooming?: None Help from another person toileting, which includes using toliet, bedpan, or urinal?: A Little Help from another person bathing (including washing, rinsing, drying)?: A Little Help from another person to put on and taking off regular upper body clothing?: None Help from another person to put on and taking off regular lower body clothing?: A Little 6 Click Score: 21   End of Session Nurse Communication: Mobility status  Activity Tolerance: Patient tolerated treatment well Patient left: in bed;with call bell/phone within reach  OT Visit Diagnosis: Unsteadiness on feet (R26.81)                Time: 6301-6010 OT Time Calculation (min): 13 min Charges:  OT General Charges $OT Visit: 1 Visit OT Evaluation $OT Eval Moderate Complexity: 1 Mod  12/31/2021  RP, OTR/L  Acute Rehabilitation Services  Office:  [phone]   Metta Clines 12/31/2021, 2:07 PM

## 2022-01-04 DIAGNOSIS — Z8673 Personal history of transient ischemic attack (TIA), and cerebral infarction without residual deficits: Secondary | ICD-10-CM | POA: Diagnosis not present

## 2022-01-04 DIAGNOSIS — K625 Hemorrhage of anus and rectum: Secondary | ICD-10-CM | POA: Diagnosis not present

## 2022-01-04 DIAGNOSIS — Z789 Other specified health status: Secondary | ICD-10-CM | POA: Diagnosis not present

## 2022-01-05 ENCOUNTER — Ambulatory Visit: Payer: Medicare Other | Admitting: Adult Health

## 2022-01-16 ENCOUNTER — Telehealth: Payer: Self-pay

## 2022-01-16 NOTE — Telephone Encounter (Signed)
FYI:  Spoke with patient last week to schedule an appointment d/t new referral for CVA. The patient advised that she is still interested in hyperbaric oxygen therapy for CVA patients. I advised that I was not familiar with that as I was not clinical, but that it looked like Dr Leonie Man had offered to enter a one time referral for one visit the last time she was seen at our office. She advised that she was not familiar with that but that she would like to schedule and see if he would still be willing to do that. I advised I would need to confirm with Dr Leonie Man since it was offered so long ago. I spoke with Dr Leonie Man who stated he would discuss this treatment with her at her visit and a referral could be placed then.  I advised patient of same, who was upset that the provider "went back on his word" and requested that the appointment with [place] be cancelled.

## 2022-01-17 DIAGNOSIS — Z789 Other specified health status: Secondary | ICD-10-CM

## 2022-01-17 NOTE — Progress Notes (Signed)
[facility])                                            [place] Team                                        Statin Quality Measure Assessment    01/17/2022  Cynthia Cook 1955-05-05 [id number]  Per review of chart and payor information, this patient has been flagged for non-adherence to the following CMS Quality Measure:   '[]'$  Statin Use in Persons with Diabetes  '[x]'$  Statin Use in Persons with Cardiovascular Disease  The ASCVD Risk score (Arnett DK, et al., 2019) failed to calculate for the following reasons:   The patient has a prior MI or stroke diagnosis  Patient was previously on atorvasatin but was stopped. Prior documentation of statin intolerance, but no specified reaction noted. Zetia on file.  Next appointment on 01/19/2022. Please assess statin intolerance. If deemed clinically appropriate. please consider associated exclusion code (see options below) at the next encounter.    Please consider ONE of the following recommendations:   Initiate high intensity statin Atorvastatin '40mg'$  once daily, #90, 3 refills   Rosuvastatin '20mg'$  once daily, #90, 3 refills    Initiate moderate intensity          statin with reduced frequency if prior          statin intolerance 1x weekly, #13, 3 refills   2x weekly, #26, 3 refills   3x weekly, #39, 3 refills   Code for past statin intolerance or other exclusions (required annually)  Drug Induced Myopathy G72.0   Myositis, unspecified M60.9   Rhabdomyolysis M62.82   Cirrhosis of liver K74.69   Biliary cirrhosis, unspecified K74.5   Abnormal blood glucose - for SUPD ONLY R73.09   Prediabetes - for SUPD ONLY  R73.03   Thank you for your time,  Kristeen Miss, [place] Cell: [phone]

## 2022-01-18 ENCOUNTER — Encounter (HOSPITAL_BASED_OUTPATIENT_CLINIC_OR_DEPARTMENT_OTHER): Payer: Self-pay | Admitting: Nurse Practitioner

## 2022-01-19 ENCOUNTER — Ambulatory Visit (HOSPITAL_BASED_OUTPATIENT_CLINIC_OR_DEPARTMENT_OTHER): Payer: Medicare Other | Admitting: Nurse Practitioner

## 2022-02-10 ENCOUNTER — Ambulatory Visit (INDEPENDENT_AMBULATORY_CARE_PROVIDER_SITE_OTHER): Payer: Medicare Other | Admitting: Nurse Practitioner

## 2022-02-10 ENCOUNTER — Encounter: Payer: Self-pay | Admitting: Nurse Practitioner

## 2022-02-10 VITALS — BP 116/74 | HR 75 | Ht 66.0 in | Wt 174.6 lb

## 2022-02-10 DIAGNOSIS — R030 Elevated blood-pressure reading, without diagnosis of hypertension: Secondary | ICD-10-CM | POA: Insufficient documentation

## 2022-02-10 DIAGNOSIS — G4719 Other hypersomnia: Secondary | ICD-10-CM | POA: Diagnosis not present

## 2022-02-10 DIAGNOSIS — I639 Cerebral infarction, unspecified: Secondary | ICD-10-CM

## 2022-02-10 NOTE — Assessment & Plan Note (Signed)
She has excessive daytime sleepiness, dry mouth, elevated morning BPs. History of a fib and stroke. Given this,  I am concerned she could have sleep disordered breathing with obstructive sleep apnea. She will need sleep study for further evaluation. Given her history of stroke, recommended in lab study to evaluate for central events.    - discussed how weight can impact sleep and risk for sleep disordered breathing - discussed options to assist with weight loss: combination of diet modification, cardiovascular and strength training exercises   - had an extensive discussion regarding the adverse health consequences related to untreated sleep disordered breathing - specifically discussed the risks for hypertension, coronary artery disease, cardiac dysrhythmias, cerebrovascular disease, and diabetes - lifestyle modification discussed   - discussed how sleep disruption can increase risk of accidents, particularly when driving - safe driving practices were discussed  Patient Instructions  Given your symptoms and history, I am concerned that you may have sleep disordered breathing with sleep apnea. With your history of stroke, you will need an in lab study for further evaluation. Someone will contact you for scheduling.   We discussed how untreated sleep apnea puts an individual at risk for cardiac arrhthymias, pulm HTN, DM, stroke and increases their risk for daytime accidents. We also briefly reviewed treatment options including weight loss, side sleeping position, oral appliance, CPAP therapy or referral to ENT for possible surgical options. Cautioned to be aware of drowsy driving.  Follow up in 10-12 weeks with [name],NP or sooner if needed

## 2022-02-10 NOTE — Assessment & Plan Note (Signed)
Elevated BP readings in the morning >140 SBP. She does have a history of hypertension and is on metoprolol for a fib/HTN.

## 2022-02-10 NOTE — Progress Notes (Signed)
$'@Patient'p$  ID: Cynthia Cook, female    DOB: 1955-05-26, 66 y.o.   MRN: [id number]  Chief Complaint  Patient presents with   Follow-up    Pt sleep consult, symptoms are fatigue, high blood pressure in the morning, dry mouth, she believes that she snores as well. Never has a sleep study.     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 Past medical history significant for PAF, hx of CVA, Hashimoto's, hx of subdural hematoma, CKD stage III.   TEST/EVENTS:   02/10/2022: Today - sleep consult Patient presents today for sleep consult, referred by Armanda Heritage, NP. She has been struggling with daytime fatigue, worse over the past few months. She has noticed that her mouth is dry in the morning and she's also had increased blood pressure readings in the AM over the past 3-4 months. Denies any morning headaches, drowsy driving, sleep parasomnias/paralysis. No history of sleep disordered breathing, narcolepsy or cataplexy. No previous sleep studies. She's been working on weight loss over the past few years and is down 80 lb.  He goes to bed between 10-11 pm. Falls asleep within 10 to 15 minutes.  Wakes 1-2 times at night.  If she gets up from 7 to 7:30 AM.  Sleeps in a recliner due to chronic back pain.  No sleep aids.  No excessive caffeine intake. Lives at home alone.  She is a former retired Marine scientist.  Never smoker.  Does not drink alcohol.  Past history of stroke (12/2020) and chronic A-fib.  Family history of heart disease.   Epworth 4  Allergies  Allergen Reactions   Codeine Nausea Only   Fentanyl Nausea And Vomiting    Dizziness, lightheaded      There is no immunization history on file for this patient.  Past Medical History:  Diagnosis Date   Back pain    Gout    Murmur    PE (pulmonary embolism)    Persistent atrial fibrillation (HCC)    Pulmonary emboli (HCC) 02/05/2020   Syncope    Thyroid  disease     Tobacco History: Social History   Tobacco Use  Smoking Status Never  Smokeless Tobacco Never   Counseling given: Not Answered   Outpatient Medications Prior to Visit  Medication Sig Dispense Refill   ascorbic acid (VITAMIN C) 500 MG tablet Take 500 mg by mouth daily.     ezetimibe (ZETIA) 10 MG tablet Take 1 tablet (10 mg total) by mouth daily. 30 tablet 2   levothyroxine (SYNTHROID) 50 MCG tablet Take 1 tablet (50 mcg) by mouth on Monday, Wednesday, and Friday 45 tablet 11   levothyroxine (SYNTHROID) 75 MCG tablet Take 1 tablet (75 mcg) by mouth on Tuesday, Thursday, Saturday, and Sunday 45 tablet 11   metoprolol succinate (TOPROL-XL) 25 MG 24 hr tablet Take 25 mg by mouth daily. Pt reports she doesn't take it daily     triamcinolone cream (KENALOG) 0.1 % Apply 1 application. topically daily as needed (rash). 30 g 1   Vitamin D-Vitamin K (VITAMIN K2-VITAMIN D3 PO) Take 1 tablet by mouth daily.      vitamin E 180 MG (400 UNITS) capsule Take 400 Units by mouth daily.     aspirin 81 MG chewable tablet Chew 81 mg by mouth daily. (Patient not taking: Reported on 02/10/2022)     clopidogrel (PLAVIX) 75 MG tablet Take 1 tablet (75 mg total) by mouth  daily. Take along with aspirin for 3 weeks, following which stop plavix and continue Aspirin (Patient not taking: Reported on 02/10/2022) 21 tablet 0   predniSONE (DELTASONE) 20 MG tablet Take 2 tablets (40 mg total) by mouth daily with breakfast for 2 days, THEN 1 tablet (20 mg total) daily with breakfast for 2 days, THEN 0.5 tablets (10 mg total) daily with breakfast for 4 days. (Patient not taking: Reported on 02/10/2022) 8 tablet 0   colchicine 0.6 MG tablet Take 2 tabs (1.'2mg'$ ) at initial sign of gout attack and 1 tab (0.'6mg'$ ) 1 hour later THEN take 1 tab (0.'6mg'$ ) twice a day until gout flair resolves. 25 tablet 3   No facility-administered medications prior to visit.     Review of Systems:   Constitutional: No unintentional weight  loss or gain, night sweats, fevers, chills, or lassitude. +excessive daytime fatigue HEENT: No headaches, difficulty swallowing, tooth/dental problems, or sore throat. No sneezing, itching, ear ache, nasal congestion, or post nasal drip. +morning dry mouth CV:  +increased BP in AM; occasional dependent swelling in feet. No chest pain, orthopnea, PND, anasarca, dizziness, palpitations, syncope Resp: No shortness of breath with exertion or at rest. No excess mucus or change in color of mucus. No productive or non-productive. No hemoptysis. No wheezing.  No chest wall deformity GI:  No heartburn, indigestion, abdominal pain, nausea, vomiting, diarrhea, change in bowel habits, loss of appetite, bloody stools.  GU: No dysuria, change in color of urine, urgency or frequency.  No flank pain, no hematuria  Skin: No rash, lesions, ulcerations MSK:  No joint pain or swelling.  No decreased range of motion.  No back pain. Neuro: No dizziness or lightheadedness.  Psych: +depression. No SI/HI. No anxiety. Mood stable.     Physical Exam:  BP 116/74   Pulse 75   Ht '5\' 6"'$  (1.676 m)   Wt 174 lb 9.6 oz (79.2 kg)   SpO2 99%   BMI 28.18 kg/m   GEN: Pleasant, interactive, well-developed; in no acute distress. HEENT:  Normocephalic and atraumatic. PERRLA. Sclera white. Nasal turbinates pink, moist and patent bilaterally. No rhinorrhea present. Oropharynx pink and moist, without exudate or edema. No lesions, ulcerations, or postnasal drip. Mallampati II NECK:  Supple w/ fair ROM. No JVD present. Normal carotid impulses w/o bruits. Thyroid symmetrical with no goiter or nodules palpated. No lymphadenopathy.   CV: RRR, no m/r/g, no peripheral edema. Pulses intact, +2 bilaterally. No cyanosis, pallor or clubbing. PULMONARY:  Unlabored, regular breathing. Clear bilaterally A&P w/o wheezes/rales/rhonchi. No accessory muscle use.  GI: BS present and normoactive. Soft, non-tender to palpation. No organomegaly or masses  detected.  MSK: No erythema, warmth or tenderness. No deformities or joint swelling noted.  Neuro: A/Ox3. No focal deficits noted.   Skin: Warm, no lesions or rashe Psych: Normal affect and behavior. Judgement and thought content appropriate.     Lab Results:  CBC    Component Value Date/Time   WBC 5.4 12/30/2021 1311   RBC 4.25 12/30/2021 1311   HGB 11.9 (L) 12/30/2021 1316   HGB 12.4 10/10/2021 1047   HCT 35.0 (L) 12/30/2021 1316   HCT 37.6 10/10/2021 1047   PLT 133 (L) 12/30/2021 1311   PLT 162 10/10/2021 1047   MCV 85.2 12/30/2021 1311   MCV 87 10/10/2021 1047   MCH 28.7 12/30/2021 1311   MCHC 33.7 12/30/2021 1311   RDW 12.8 12/30/2021 1311   RDW 12.4 10/10/2021 1047   LYMPHSABS 1.1 12/30/2021 1311  LYMPHSABS 1.1 10/10/2021 1047   MONOABS 0.4 12/30/2021 1311   EOSABS 0.2 12/30/2021 1311   EOSABS 0.6 (H) 10/10/2021 1047   BASOSABS 0.0 12/30/2021 1311   BASOSABS 0.1 10/10/2021 1047    BMET    Component Value Date/Time   NA 138 12/30/2021 1316   NA 142 10/10/2021 1047   K 4.2 12/30/2021 1316   CL 98 12/30/2021 1316   CO2 28 12/30/2021 1311   GLUCOSE 67 (L) 12/30/2021 1316   BUN 29 (H) 12/30/2021 1316   BUN 28 (H) 10/10/2021 1047   CREATININE 1.40 (H) 12/30/2021 1316   CREATININE 1.32 (H) 11/02/2020 0859   CALCIUM 9.2 12/30/2021 1311   GFRNONAA 41 (L) 12/30/2021 1311   GFRAA 57 (L) 03/16/2020 1435    BNP    Component Value Date/Time   BNP 341.7 (H) 02/05/2020 1811     Imaging:  No results found.        No data to display          No results found for: "NITRICOXIDE"      Assessment & Plan:   Excessive daytime sleepiness She has excessive daytime sleepiness, dry mouth, elevated morning BPs. History of a fib and stroke. Given this,  I am concerned she could have sleep disordered breathing with obstructive sleep apnea. She will need sleep study for further evaluation. Given her history of stroke, recommended in lab study to evaluate for  central events.    - discussed how weight can impact sleep and risk for sleep disordered breathing - discussed options to assist with weight loss: combination of diet modification, cardiovascular and strength training exercises   - had an extensive discussion regarding the adverse health consequences related to untreated sleep disordered breathing - specifically discussed the risks for hypertension, coronary artery disease, cardiac dysrhythmias, cerebrovascular disease, and diabetes - lifestyle modification discussed   - discussed how sleep disruption can increase risk of accidents, particularly when driving - safe driving practices were discussed  Patient Instructions  Given your symptoms and history, I am concerned that you may have sleep disordered breathing with sleep apnea. With your history of stroke, you will need an in lab study for further evaluation. Someone will contact you for scheduling.   We discussed how untreated sleep apnea puts an individual at risk for cardiac arrhthymias, pulm HTN, DM, stroke and increases their risk for daytime accidents. We also briefly reviewed treatment options including weight loss, side sleeping position, oral appliance, CPAP therapy or referral to ENT for possible surgical options. Cautioned to be aware of drowsy driving.  Follow up in 10-12 weeks with Joellen Jersey Dezyre Hoefer,NP or sooner if needed   Elevated blood pressure reading Elevated BP readings in the morning >140 SBP. She does have a history of hypertension and is on metoprolol for a fib/HTN.   I spent 35 minutes of dedicated to the care of this patient on the date of this encounter to include pre-visit review of records, face-to-face time with the patient discussing conditions above, post visit ordering of testing, clinical documentation with the electronic health record, making appropriate referrals as documented, and communicating necessary findings to members of the patients care team.  Clayton Bibles, NP 02/10/2022  Pt aware and understands NP's role.

## 2022-02-10 NOTE — Progress Notes (Signed)
Reviewed and agree with assessment/plan.   Chesley Mires, MD [facility] Pulmonary/Critical Care 02/10/2022, 3:05 PM Pager:  [phone]

## 2022-02-10 NOTE — Patient Instructions (Addendum)
Given your symptoms and history, I am concerned that you may have sleep disordered breathing with sleep apnea. With your history of stroke, you will need an in lab study for further evaluation. Someone will contact you for scheduling.   We discussed how untreated sleep apnea puts an individual at risk for cardiac arrhthymias, pulm HTN, DM, stroke and increases their risk for daytime accidents. We also briefly reviewed treatment options including weight loss, side sleeping position, oral appliance, CPAP therapy or referral to ENT for possible surgical options. Cautioned to be aware of drowsy driving.  Follow up in 10-12 weeks with Cynthia Jersey Mitsue Peery,NP or sooner if needed

## 2022-02-21 ENCOUNTER — Inpatient Hospital Stay: Payer: Medicare Other | Admitting: Neurology

## 2022-03-08 ENCOUNTER — Encounter (HOSPITAL_BASED_OUTPATIENT_CLINIC_OR_DEPARTMENT_OTHER): Payer: Medicare Other | Admitting: Pulmonary Disease

## 2022-03-14 DIAGNOSIS — Z136 Encounter for screening for cardiovascular disorders: Secondary | ICD-10-CM | POA: Diagnosis not present

## 2022-03-14 DIAGNOSIS — E039 Hypothyroidism, unspecified: Secondary | ICD-10-CM | POA: Diagnosis not present

## 2022-03-14 DIAGNOSIS — Z0000 Encounter for general adult medical examination without abnormal findings: Secondary | ICD-10-CM | POA: Diagnosis not present

## 2022-03-14 DIAGNOSIS — R011 Cardiac murmur, unspecified: Secondary | ICD-10-CM | POA: Diagnosis not present

## 2022-03-14 DIAGNOSIS — Z8673 Personal history of transient ischemic attack (TIA), and cerebral infarction without residual deficits: Secondary | ICD-10-CM | POA: Diagnosis not present

## 2022-03-14 DIAGNOSIS — Z8679 Personal history of other diseases of the circulatory system: Secondary | ICD-10-CM | POA: Diagnosis not present

## 2022-03-14 DIAGNOSIS — Z7689 Persons encountering health services in other specified circumstances: Secondary | ICD-10-CM | POA: Diagnosis not present

## 2022-03-17 DIAGNOSIS — M9901 Segmental and somatic dysfunction of cervical region: Secondary | ICD-10-CM | POA: Diagnosis not present

## 2022-03-17 DIAGNOSIS — M62830 Muscle spasm of back: Secondary | ICD-10-CM | POA: Diagnosis not present

## 2022-03-17 DIAGNOSIS — M9903 Segmental and somatic dysfunction of lumbar region: Secondary | ICD-10-CM | POA: Diagnosis not present

## 2022-03-17 DIAGNOSIS — M9902 Segmental and somatic dysfunction of thoracic region: Secondary | ICD-10-CM | POA: Diagnosis not present

## 2022-03-21 DIAGNOSIS — W19XXXA Unspecified fall, initial encounter: Secondary | ICD-10-CM | POA: Diagnosis not present

## 2022-03-21 DIAGNOSIS — M545 Low back pain, unspecified: Secondary | ICD-10-CM | POA: Diagnosis not present

## 2022-03-23 DIAGNOSIS — M545 Low back pain, unspecified: Secondary | ICD-10-CM | POA: Diagnosis not present

## 2022-03-27 IMAGING — CT CT ANGIO CHEST
2 of 7 series · 15 of 46 positions shown · IV contrast (omnipaque)
Comparison: Radiograph 02/05/2020

CLINICAL DATA: Positive D-dimer, hypoxia, syncope, history of prior
PE

EXAM:
CT ANGIOGRAPHY CHEST WITH CONTRAST
TECHNIQUE: Multidetector CT imaging of the chest was performed using the
standard protocol during bolus administration of intravenous
contrast. Multiplanar CT image reconstructions and MIPs were
obtained to evaluate the vascular anatomy.
CONTRAST:  68mL OMNIPAQUE IOHEXOL 350 MG/ML SOLN

[Series 7: thins · axial · 0.84mm/px · z∈[-44,+195]mm · 12 of 386 slices shown]
[im 22/386  lung]
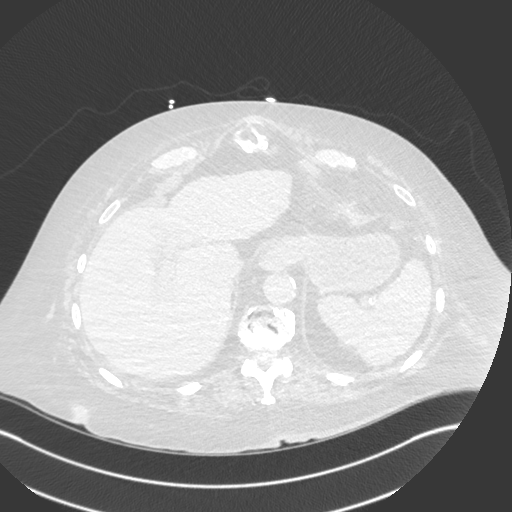
[im 65/386  soft-tissue]
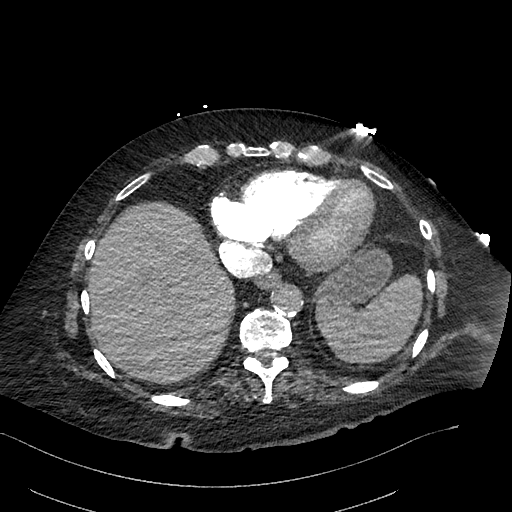
[im 86/386  lung]
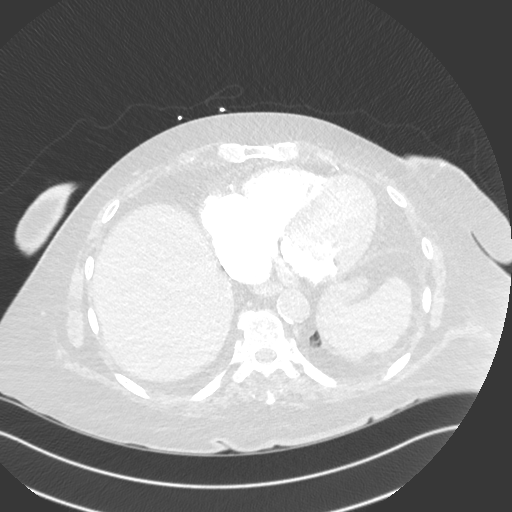
[im 107/386  soft-tissue]
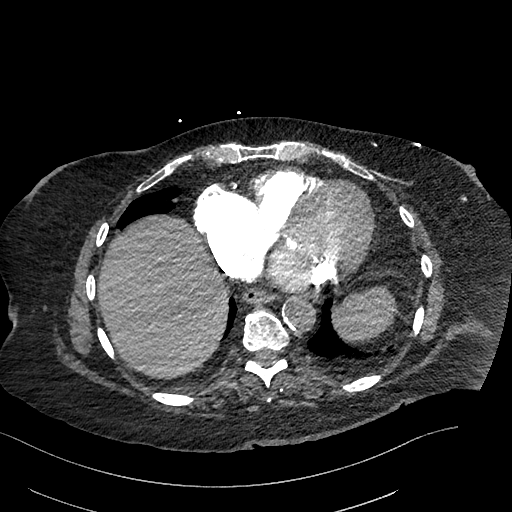
[im 150/386  lung]
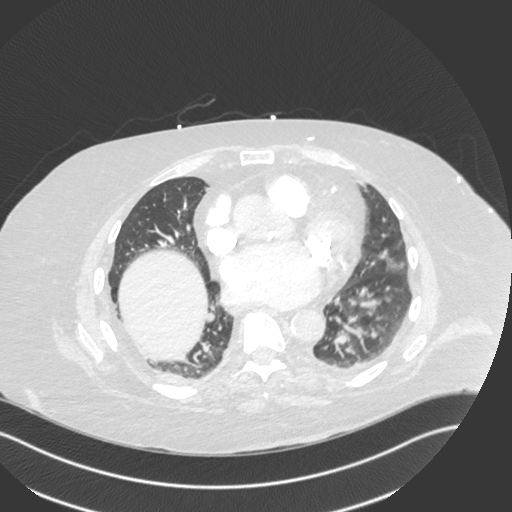
[im 172/386  soft-tissue]
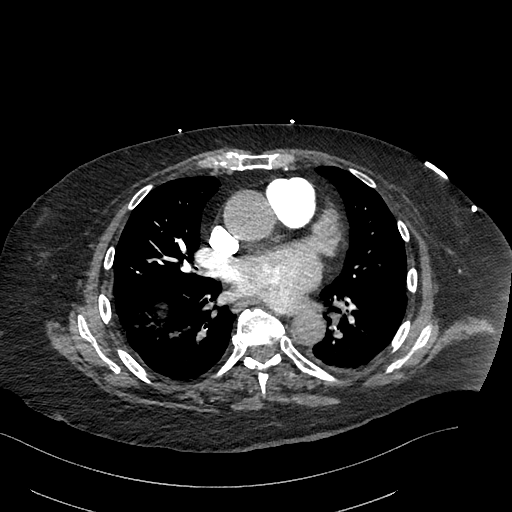
[im 214/386  lung]
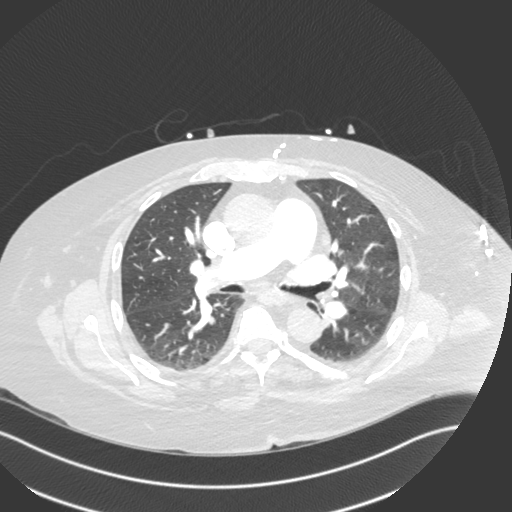
[im 236/386  soft-tissue]
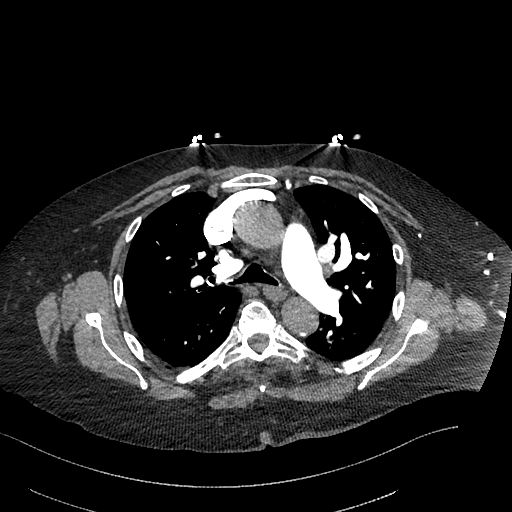
[im 279/386  lung]
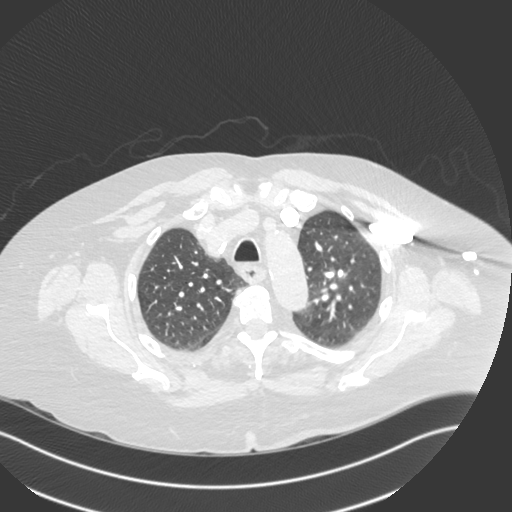
[im 300/386  soft-tissue]
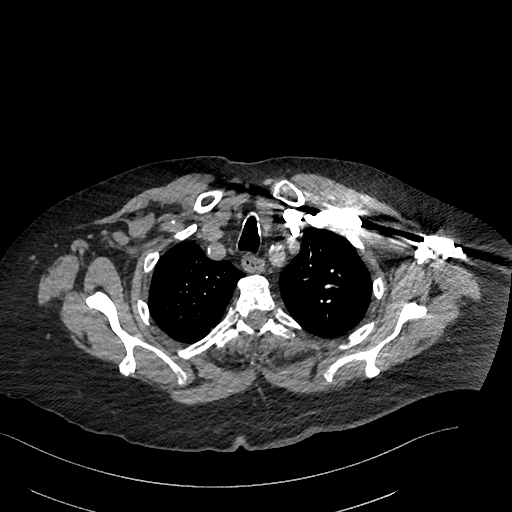
[im 321/386  lung]
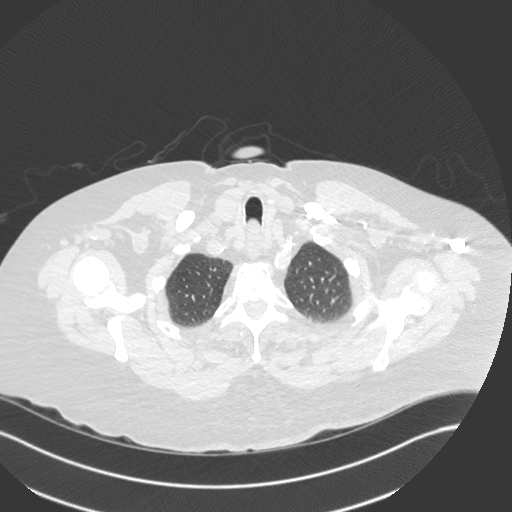
[im 364/386  soft-tissue]
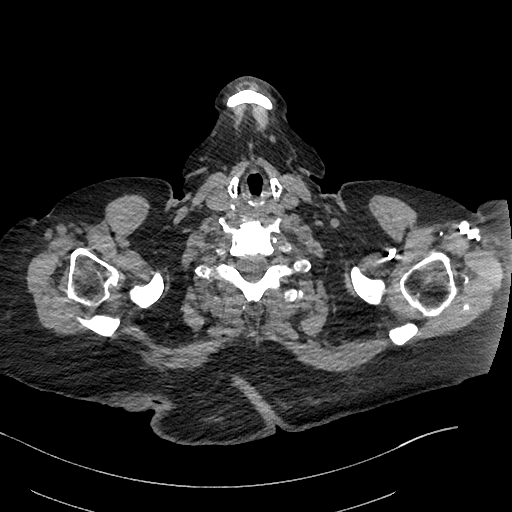

[Series 8: cor · coronal · 0.57mm/px · 3 of 150 slices shown]
[im 38/150  soft-tissue]
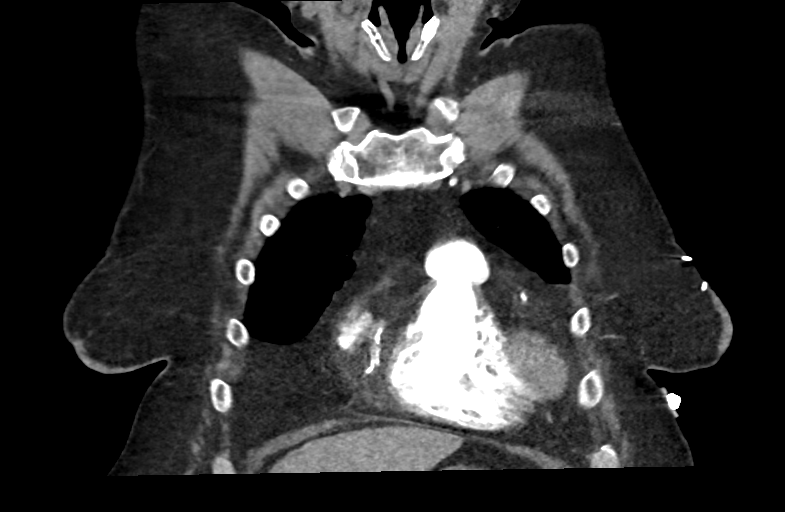
[im 75/150  soft-tissue]
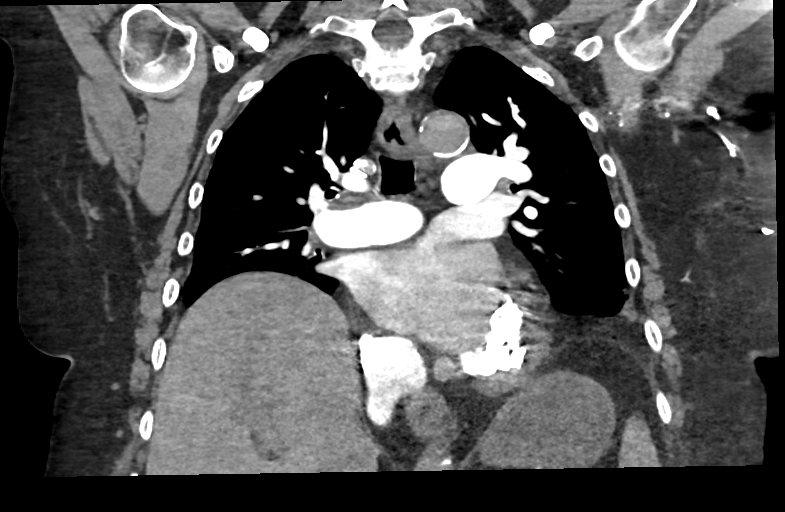
[im 112/150  soft-tissue]
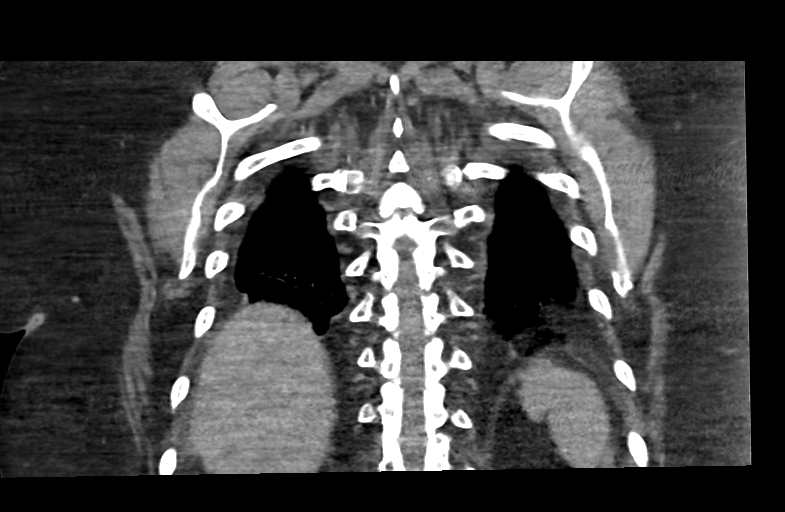

[15 of 46 positions shown; findings below may reference images not displayed]

FINDINGS: Cardiovascular: While there is satisfactory central pulmonary
arterial opacification. Contrast opacification of pulmonary arteries
is significantly diminished beyond the central and lobar segments.
However, there are several areas which are concerning for pulmonary
artery emboli. The first being some thin linear almost web-like
filling defects present in the right intralobar pulmonary artery
which could suggest some chronic embolic disease albeit with more
expanded and unopacified segmental branches in the posteromedial
basal segments of the right lower lobe as well as several segmental
branches in the left lower lobe as well. Central pulmonary arteries
are top-normal caliber. RV/LV ratio is maintained.

Cardiac size is top normal. Three-vessel coronary artery
calcifications are present. Dense calcification is present in the
mitral annulus. Additional calcifications are present upon the
aortic leaflets. Atherosclerotic plaque within the normal caliber
aorta. Suboptimal opacification for luminal evaluation of the aorta.
No gross aortic abnormality is seen. Normal 3 vessel branching of
the aortic arch. Proximal great vessels are calcified and slightly
tortuous but otherwise unremarkable. No major venous abnormalities
are seen.

Mediastinum/Nodes: No mediastinal fluid or gas. Normal thyroid gland
and thoracic inlet. No acute abnormality of the trachea. Small
hiatal hernia without other acute esophageal abnormality. No
worrisome mediastinal, hilar or axillary adenopathy.

Lungs/Pleura: Lung volumes are low with dependent atelectatic change
and additional bandlike areas of opacity in both lower lobes and the
right middle lobe likely reflecting further subsegmental atelectasis
or scarring. No pneumothorax or pleural effusion. Some mild mosaic
attenuation may reflect air trapping or atelectatic change
accentuated by imaging during exhalation. 7 mm juxta phrenic nodule
seen in the right lower lobe (6/84). No other concerning pulmonary
nodules or masses.

Upper Abdomen: No acute abnormalities present in the visualized
portions of the upper abdomen.

Musculoskeletal: Multilevel degenerative changes are present in the
imaged portions of the spine. Additional degenerative changes in the
shoulders. No worrisome chest wall lesions.

Review of the MIP images confirms the above findings.
IMPRESSION: 1. Excellent opacification of the central lobar pulmonary arteries
however there is mixing artifact within the more distal segmental
and subsegmental branches. Web-like filling defects are seen in the
right intralobar pulmonary artery which could suggest some chronic
pulmonary embolus in this patient history of prior embolic disease.
Suspect additional filling defects within segmental and subsegmental
branches of both lower lobes though these may be accentuated by
mixing artifact. No convincing CT evidence of right heart strain at
this time.
2. Cardiomegaly. Extensive calcifications upon the mitral annulus
and to a lesser extent the aortic leaflets may warrant further
evaluation with echocardiography to assess for underlying valvular
dysfunction. Three-vessel coronary artery calcifications.
3. Low lung volumes and basilar atelectatic changes with some more
mosaic attenuation likely reflecting further atelectasis or air
trapping.
4. 7 mm juxta phrenic nodule in the right lower lobe. Non-contrast
chest CT at 6-12 months is recommended. If the nodule is stable at
time of repeat CT, then future CT at 18-24 months (from today's
scan) is considered optional for low-risk patients, but is
recommended for high-risk patients. This recommendation follows the
consensus statement: Guidelines for Management of Incidental
Pulmonary Nodules Detected on CT Images: From the [HOSPITAL]
5. Aortic Atherosclerosis ([id number]-ANI.I).

Critical Value/emergent results were called by telephone at the time
of interpretation on 02/05/2020 at [DATE] to provider YIN COUTO ,
who verbally acknowledged these results.

## 2022-03-27 IMAGING — CT CT HEAD W/O CM
3 of 4 series · 13 of 47 positions shown, 15 images · non-contrast
Comparison: None.

CLINICAL DATA: 64-year-old female with trauma.

EXAM:
CT HEAD WITHOUT CONTRAST
CT CERVICAL SPINE WITHOUT CONTRAST
TECHNIQUE: Multidetector CT imaging of the head and cervical spine was
performed following the standard protocol without intravenous
contrast. Multiplanar CT image reconstructions of the cervical spine
were also generated.

[Series 1: head without · axial · non-contrast · 0.46mm/px · z∈[+915,+1035]mm · 7 of 32 slices shown, 9 images]
[im 4/32  brain]
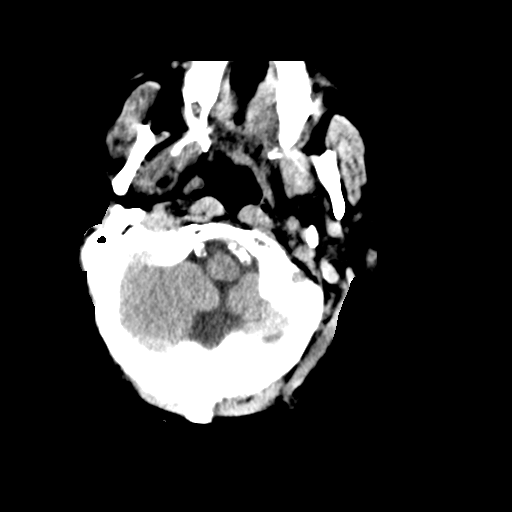
[im 4/32  bone]
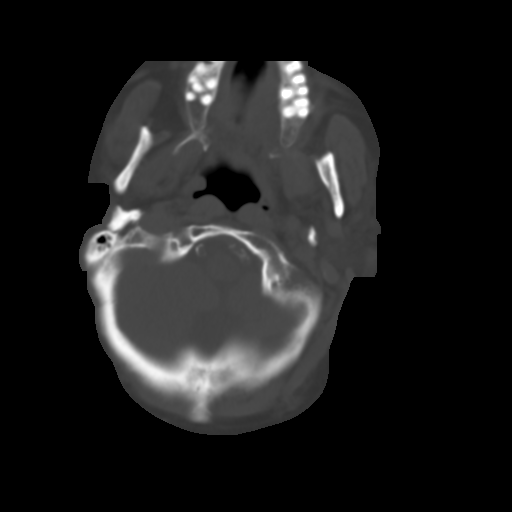
[im 8/32  brain]
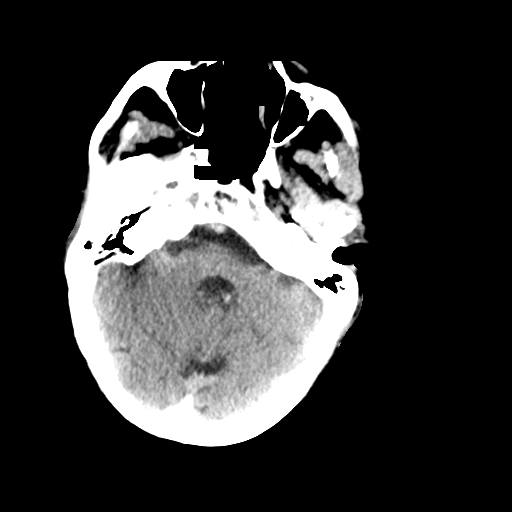
[im 12/32  brain]
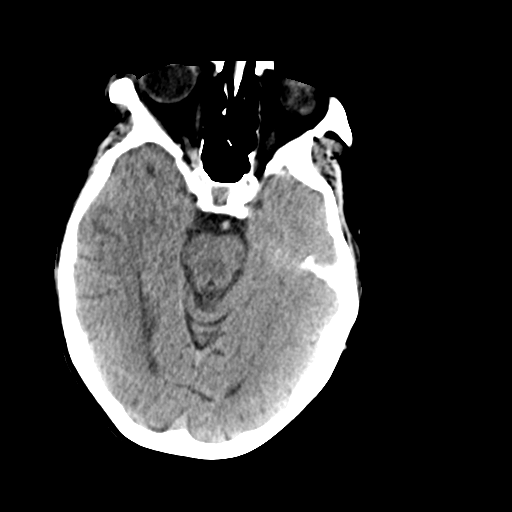
[im 16/32  brain]
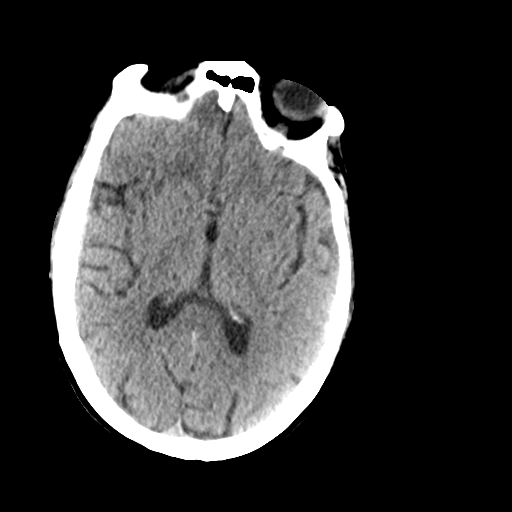
[im 20/32  brain]
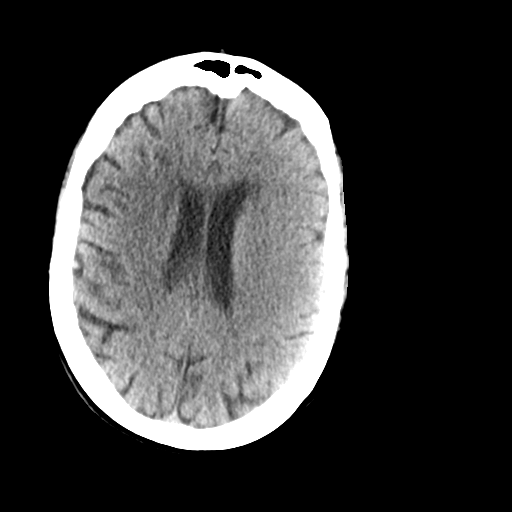
[im 20/32  bone]
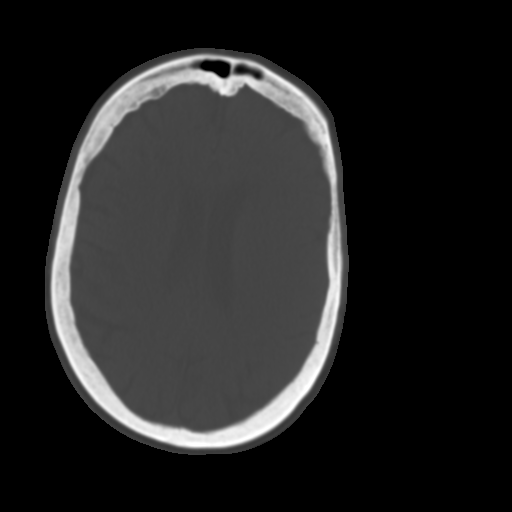
[im 24/32  brain]
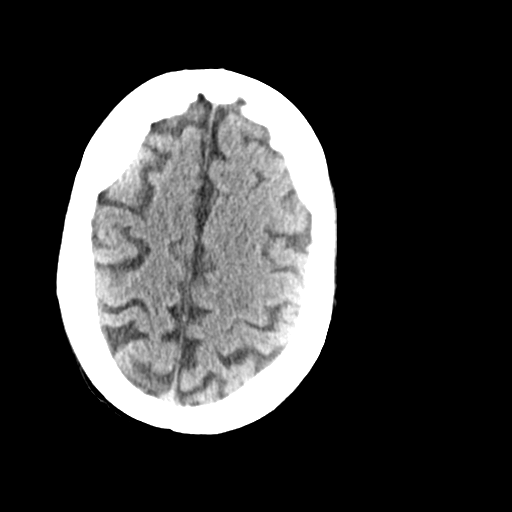
[im 28/32  brain]
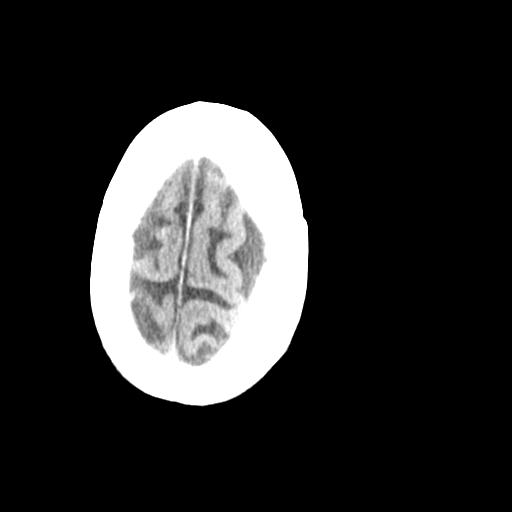

[Series 7: head without cor · coronal · non-contrast · 0.30mm/px · 3 of 73 slices shown]
[im 25/73  brain]
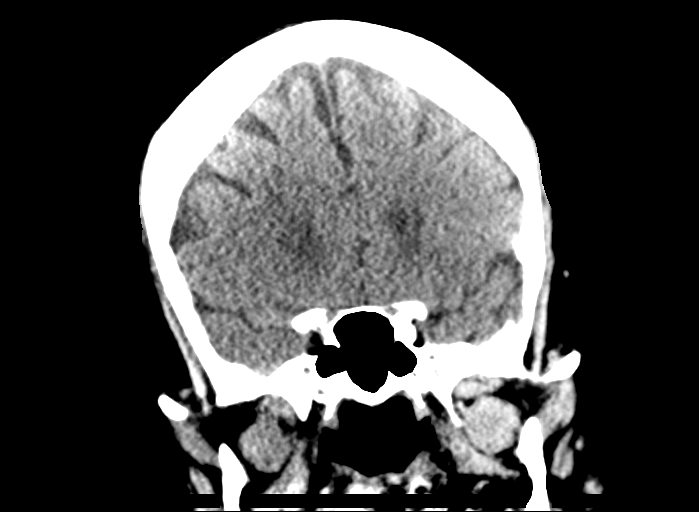
[im 33/73  brain]
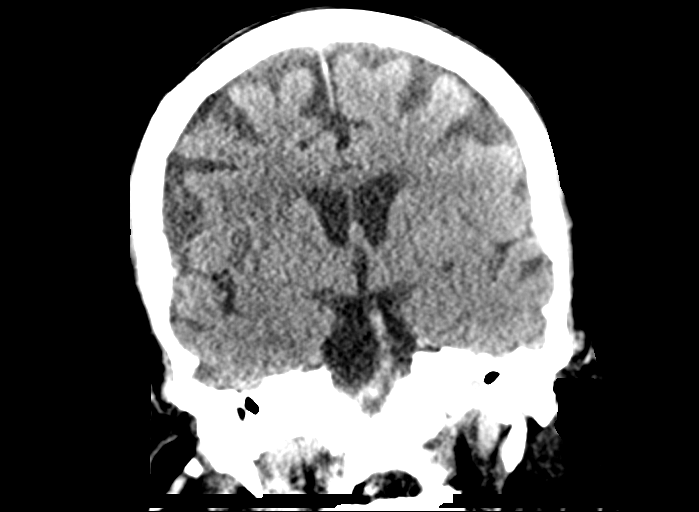
[im 41/73  brain]
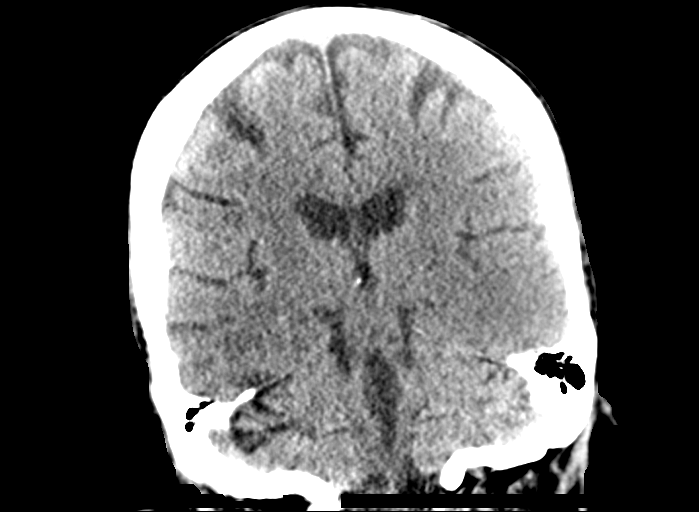

[Series 8: head without sag · sagittal · non-contrast · 0.30mm/px · 3 of 67 slices shown]
[im 23/67  brain]
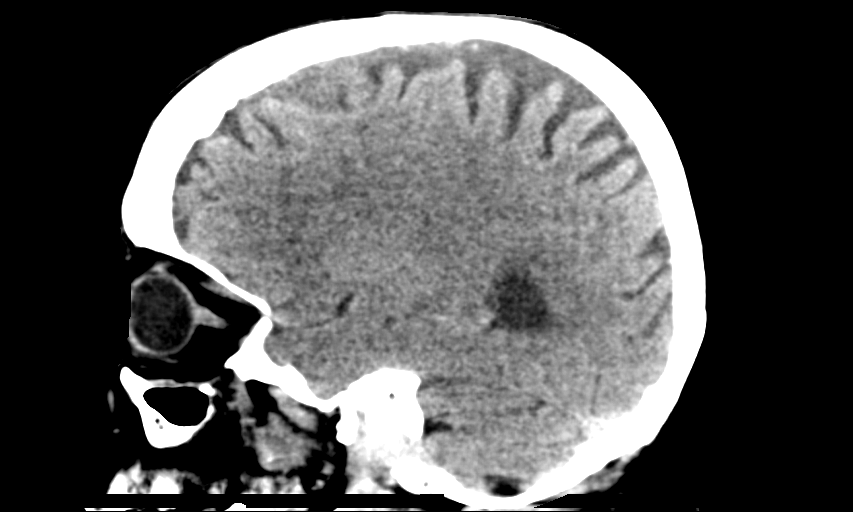
[im 34/67  brain]
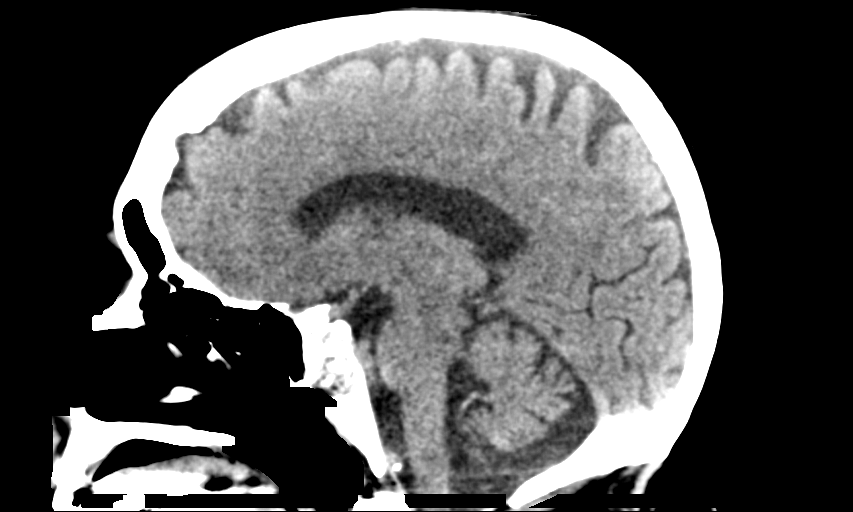
[im 45/67  brain]
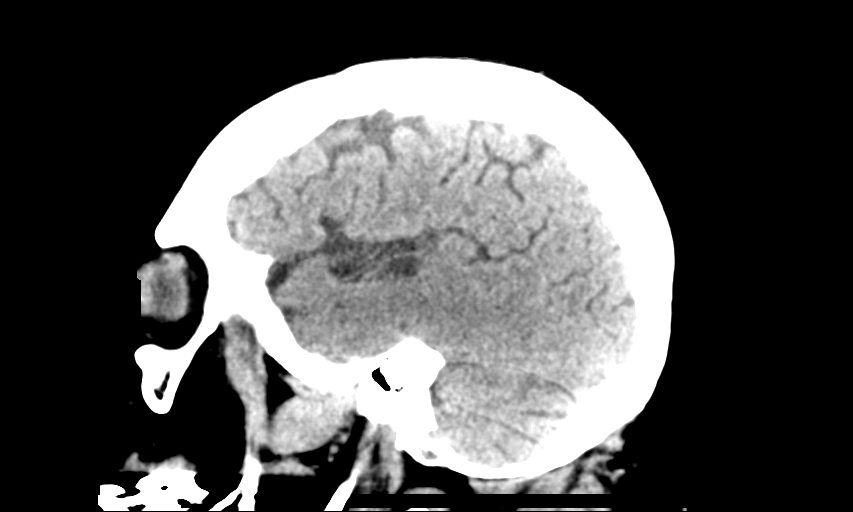

[13 of 47 positions shown; findings below may reference images not displayed]

FINDINGS: CT HEAD FINDINGS

Brain: Mild age-related atrophy and chronic microvascular ischemic
changes. There is no acute intracranial hemorrhage. No mass effect
or midline shift. No extra-axial fluid collection.

Vascular: No hyperdense vessel or unexpected calcification.

Skull: Normal. Negative for fracture or focal lesion.

Sinuses/Orbits: No acute finding.

Other: None

CT CERVICAL SPINE FINDINGS

Alignment: No acute subluxation.

Skull base and vertebrae: No acute fracture.

Soft tissues and spinal canal: No prevertebral fluid or swelling. No
visible canal hematoma.

Disc levels:  Degenerative changes.

Upper chest: There is pulmonary vascular prominence with
cephalization, likely vascular congestion. Clinical correlation is
recommended.

Other: Bilateral carotid bulb calcified plaques.
IMPRESSION: 1. No acute intracranial pathology. Mild age-related atrophy and
chronic microvascular ischemic changes.
2. No acute/traumatic cervical spine pathology.

## 2022-03-28 DIAGNOSIS — M5451 Vertebrogenic low back pain: Secondary | ICD-10-CM | POA: Diagnosis not present

## 2022-03-30 ENCOUNTER — Emergency Department (HOSPITAL_COMMUNITY): Payer: Medicare Other

## 2022-03-30 ENCOUNTER — Encounter (HOSPITAL_COMMUNITY): Payer: Self-pay | Admitting: Student

## 2022-03-30 ENCOUNTER — Other Ambulatory Visit: Payer: Self-pay

## 2022-03-30 ENCOUNTER — Emergency Department (HOSPITAL_COMMUNITY)
Admission: EM | Admit: 2022-03-30 | Discharge: 2022-03-30 | Disposition: A | Discharge status: Home / Self Care | Payer: Medicare Other | Attending: Emergency Medicine | Admitting: Emergency Medicine

## 2022-03-30 DIAGNOSIS — E876 Hypokalemia: Secondary | ICD-10-CM | POA: Insufficient documentation

## 2022-03-30 DIAGNOSIS — R062 Wheezing: Secondary | ICD-10-CM | POA: Diagnosis not present

## 2022-03-30 DIAGNOSIS — Z1152 Encounter for screening for COVID-19: Secondary | ICD-10-CM | POA: Insufficient documentation

## 2022-03-30 DIAGNOSIS — Z7902 Long term (current) use of antithrombotics/antiplatelets: Secondary | ICD-10-CM | POA: Insufficient documentation

## 2022-03-30 DIAGNOSIS — Z7982 Long term (current) use of aspirin: Secondary | ICD-10-CM | POA: Insufficient documentation

## 2022-03-30 DIAGNOSIS — B338 Other specified viral diseases: Secondary | ICD-10-CM

## 2022-03-30 DIAGNOSIS — R0602 Shortness of breath: Secondary | ICD-10-CM | POA: Diagnosis not present

## 2022-03-30 DIAGNOSIS — B974 Respiratory syncytial virus as the cause of diseases classified elsewhere: Secondary | ICD-10-CM | POA: Diagnosis not present

## 2022-03-30 DIAGNOSIS — M549 Dorsalgia, unspecified: Secondary | ICD-10-CM | POA: Diagnosis not present

## 2022-03-30 DIAGNOSIS — Z743 Need for continuous supervision: Secondary | ICD-10-CM | POA: Diagnosis not present

## 2022-03-30 DIAGNOSIS — R6889 Other general symptoms and signs: Secondary | ICD-10-CM | POA: Diagnosis not present

## 2022-03-30 LAB — BLOOD GAS, VENOUS
Acid-Base Excess: 3.2 mmol/L — ABNORMAL HIGH (ref 0.0–2.0)
Bicarbonate: 29.9 mmol/L — ABNORMAL HIGH (ref 20.0–28.0)
O2 Saturation: 70.9 %
Patient temperature: 37
pCO2, Ven: 53 mmHg (ref 44–60)
pH, Ven: 7.36 (ref 7.25–7.43)
pO2, Ven: 43 mmHg (ref 32–45)

## 2022-03-30 LAB — CBC WITH DIFFERENTIAL/PLATELET
Abs Immature Granulocytes: 0.03 10*3/uL (ref 0.00–0.07)
Basophils Absolute: 0 10*3/uL (ref 0.0–0.1)
Basophils Relative: 1 %
Eosinophils Absolute: 0.2 10*3/uL (ref 0.0–0.5)
Eosinophils Relative: 2 %
HCT: 37.6 % (ref 36.0–46.0)
Hemoglobin: 12.1 g/dL (ref 12.0–15.0)
Immature Granulocytes: 1 %
Lymphocytes Relative: 16 %
Lymphs Abs: 1 10*3/uL (ref 0.7–4.0)
MCH: 28.4 pg (ref 26.0–34.0)
MCHC: 32.2 g/dL (ref 30.0–36.0)
MCV: 88.3 fL (ref 80.0–100.0)
Monocytes Absolute: 0.5 10*3/uL (ref 0.1–1.0)
Monocytes Relative: 9 %
Neutro Abs: 4.5 10*3/uL (ref 1.7–7.7)
Neutrophils Relative %: 71 %
Platelets: 183 10*3/uL (ref 150–400)
RBC: 4.26 MIL/uL (ref 3.87–5.11)
RDW: 13.1 % (ref 11.5–15.5)
WBC: 6.3 10*3/uL (ref 4.0–10.5)
nRBC: 0 % (ref 0.0–0.2)

## 2022-03-30 LAB — TROPONIN I (HIGH SENSITIVITY)
Troponin I (High Sensitivity): 19 ng/L — ABNORMAL HIGH (ref <18)
Troponin I (High Sensitivity): 18 ng/L — ABNORMAL HIGH (ref <18)

## 2022-03-30 LAB — COMPREHENSIVE METABOLIC PANEL
ALT: 12 U/L (ref 0–44)
AST: 22 U/L (ref 15–41)
Albumin: 4 g/dL (ref 3.5–5.0)
Alkaline Phosphatase: 95 U/L (ref 38–126)
Anion gap: 13 (ref 5–15)
BUN: 20 mg/dL (ref 8–23)
CO2: 27 mmol/L (ref 22–32)
Calcium: 9.4 mg/dL (ref 8.9–10.3)
Chloride: 99 mmol/L (ref 98–111)
Creatinine, Ser: 1.44 mg/dL — ABNORMAL HIGH (ref 0.44–1.00)
GFR, Estimated: 40 mL/min — ABNORMAL LOW (ref >60)
Glucose, Bld: 107 mg/dL — ABNORMAL HIGH (ref 70–99)
Potassium: 3.3 mmol/L — ABNORMAL LOW (ref 3.5–5.1)
Sodium: 139 mmol/L (ref 135–145)
Total Bilirubin: 2.1 mg/dL — ABNORMAL HIGH (ref 0.3–1.2)
Total Protein: 7.3 g/dL (ref 6.5–8.1)

## 2022-03-30 LAB — RESP PANEL BY RT-PCR (RSV, FLU A&B, COVID)  RVPGX2
Influenza A by PCR: NEGATIVE
Influenza B by PCR: NEGATIVE
Resp Syncytial Virus by PCR: POSITIVE — AB
SARS Coronavirus 2 by RT PCR: NEGATIVE

## 2022-03-30 MED ORDER — PREDNISONE 20 MG PO TABS: 40.0000 mg | ORAL_TABLET | Freq: Every day | ORAL | 0 refills | Status: AC

## 2022-03-30 MED ORDER — GUAIFENESIN 100 MG/5ML PO LIQD
5.0000 mL | Freq: Once | ORAL | Status: AC
  Administered 2022-03-30: 5 mL via ORAL
  Filled 2022-03-30: qty 10

## 2022-03-30 MED ORDER — IPRATROPIUM-ALBUTEROL 0.5-2.5 (3) MG/3ML IN SOLN
3.0000 mL | RESPIRATORY_TRACT | Status: AC | PRN
  Administered 2022-03-30 (×3): 3 mL via RESPIRATORY_TRACT
  Filled 2022-03-30: qty 6
  Filled 2022-03-30: qty 3

## 2022-03-30 MED ORDER — IOHEXOL 350 MG/ML SOLN: 100.0000 mL | Freq: Once | INTRAVENOUS | Status: DC | PRN

## 2022-03-30 MED ORDER — GUAIFENESIN ER 600 MG PO TB12: 600.0000 mg | ORAL_TABLET | Freq: Two times a day (BID) | ORAL | 0 refills | Status: AC

## 2022-03-30 MED ORDER — METHYLPREDNISOLONE SODIUM SUCC 125 MG IJ SOLR
125.0000 mg | Freq: Once | INTRAMUSCULAR | Status: AC
  Administered 2022-03-30: 125 mg via INTRAVENOUS
  Filled 2022-03-30: qty 2

## 2022-03-30 NOTE — Discharge Instructions (Addendum)
You tested positive for RSV today. After discussion, you have opted to forego a CT scan of the chest today. Take the prednisone as directed until finished. Use Mucinex as needed for congestion. Make sure to follow up with your primary care doctor. Return to the ER for any new or worsening symptoms

## 2022-03-30 NOTE — ED Provider Notes (Signed)
[facility] DEPT Provider Note   CSN: [id number] Arrival date & time: 03/30/22  7035     History  Chief Complaint  Patient presents with   Shortness of Breath   Wheezing    Cynthia Cook is a 67 y.o. female.  HPI 32-year female with a history of persistent atrial fibrillation, PE, gout, thyroid disease, recent L2 lumbar fracture presents to the ER with respiratory distress.  Patient woke up short of breath this morning.  Has had URI type symptoms since yesterday.  She was tachypneic on EMS arrival, O2 sats in the upper 90s, received albuterol and Atrovent with little relief.  She arrived 100% on room air but tachypneic and stating that she felt like she could not get a deep breath in.  She is not on any anticoagulation for her PE.  Reports this was 20 years ago.  No history of asthma or COPD.  Per chart review, she was recently seen by pulmonology for daytime sleepiness.    Home Medications Prior to Admission medications   Medication Sig Start Date End Date Taking? Authorizing Provider  guaiFENesin (MUCINEX) 600 MG 12 hr tablet Take 1 tablet (600 mg total) by mouth 2 (two) times daily for 5 days. 03/30/22 04/04/22 Yes Garald Balding, PA-C  predniSONE (DELTASONE) 20 MG tablet Take 2 tablets (40 mg total) by mouth daily for 5 days. 03/30/22 04/04/22 Yes Garald Balding, PA-C  ascorbic acid (VITAMIN C) 500 MG tablet Take 500 mg by mouth daily.    [provider]  aspirin 81 MG chewable tablet Chew 81 mg by mouth daily. Patient not taking: Reported on 02/10/2022 12/25/21   [provider]  clopidogrel (PLAVIX) 75 MG tablet Take 1 tablet (75 mg total) by mouth daily. Take along with aspirin for 3 weeks, following which stop plavix and continue Aspirin Patient not taking: Reported on 02/10/2022 01/01/22   Jonetta Osgood, MD  ezetimibe (ZETIA) 10 MG tablet Take 1 tablet (10 mg total) by mouth daily. 12/31/21 12/31/22  Jonetta Osgood, MD   levothyroxine (SYNTHROID) 50 MCG tablet Take 1 tablet (50 mcg) by mouth on Monday, Wednesday, and Friday 06/28/21   Early, Coralee Pesa, NP  levothyroxine (SYNTHROID) 75 MCG tablet Take 1 tablet (75 mcg) by mouth on Tuesday, Thursday, Saturday, and Sunday 06/28/21   Early, Coralee Pesa, NP  metoprolol succinate (TOPROL-XL) 25 MG 24 hr tablet Take 25 mg by mouth daily. Pt reports she doesn't take it daily    [provider]  triamcinolone cream (KENALOG) 0.1 % Apply 1 application. topically daily as needed (rash). 06/28/21   Orma Render, NP  Vitamin D-Vitamin K (VITAMIN K2-VITAMIN D3 PO) Take 1 tablet by mouth daily.     [provider]  vitamin E 180 MG (400 UNITS) capsule Take 400 Units by mouth daily.    [provider]      Allergies    Codeine and Fentanyl    Review of Systems   Review of Systems Ten systems reviewed and are negative for acute change, except as noted in the HPI.   Physical Exam Updated Vital Signs BP (!) 130/102   Pulse (!) 104   Temp 97.8 F (36.6 C) (Oral)   Resp (!) 29   SpO2 100%  Physical Exam Vitals and nursing note reviewed.  Constitutional:      General: She is not in acute distress.    Appearance: She is well-developed. She is not ill-appearing or  toxic-appearing.  HENT:     Head: Normocephalic and atraumatic.  Eyes:     Conjunctiva/sclera: Conjunctivae normal.  Cardiovascular:     Rate and Rhythm: Normal rate and regular rhythm.     Heart sounds: No murmur heard. Pulmonary:     Effort: Pulmonary effort is normal. Tachypnea present. No respiratory distress.     Breath sounds: Normal breath sounds.     Comments: Tachypneic, anxious appearing, with wheezes in bilateral upper lobes Abdominal:     Palpations: Abdomen is soft.     Tenderness: There is no abdominal tenderness.  Musculoskeletal:        General: No swelling.     Cervical back: Neck supple.  Skin:    General: Skin is warm and dry.     Capillary Refill: Capillary  refill takes less than 2 seconds.  Neurological:     Mental Status: She is alert.  Psychiatric:        Mood and Affect: Mood normal.     ED Results / Procedures / Treatments   Labs (all labs ordered are listed, but only abnormal results are displayed) Labs Reviewed  RESP PANEL BY RT-PCR (RSV, FLU A&B, COVID)  RVPGX2 - Abnormal; Notable for the following components:      Result Value   Resp Syncytial Virus by PCR POSITIVE (*)    All other components within normal limits  COMPREHENSIVE METABOLIC PANEL - Abnormal; Notable for the following components:   Potassium 3.3 (*)    Glucose, Bld 107 (*)    Creatinine, Ser 1.44 (*)    Total Bilirubin 2.1 (*)    GFR, Estimated 40 (*)    All other components within normal limits  BLOOD GAS, VENOUS - Abnormal; Notable for the following components:   Bicarbonate 29.9 (*)    Acid-Base Excess 3.2 (*)    All other components within normal limits  TROPONIN I (HIGH SENSITIVITY) - Abnormal; Notable for the following components:   Troponin I (High Sensitivity) 19 (*)    All other components within normal limits  TROPONIN I (HIGH SENSITIVITY) - Abnormal; Notable for the following components:   Troponin I (High Sensitivity) 18 (*)    All other components within normal limits  CBC WITH DIFFERENTIAL/PLATELET    EKG EKG Interpretation  Date/Time:  Thursday March 30 2022 10:39:37 EST Ventricular Rate:  112 PR Interval:  135 QRS Duration: 100 QT Interval:  283 QTC Calculation: 387 R Axis:   44 Text Interpretation: Sinus tachycardia Supraventricular bigeminy Within limitations of baseline artifact, similar to prior tracings Confirmed by Cindee Lame [phone]) on 03/30/2022 12:40:41 PM  Radiology DG Chest Portable 1 View  Result Date: 03/30/2022 CLINICAL DATA:  Provided history: Shortness of breath. History of heart murmur and PE. EXAM: PORTABLE CHEST 1 VIEW COMPARISON:  Prior chest radiographs 02/27/2020 and earlier. Chest CT 02/27/2020.  FINDINGS: Shallow inspiration radiograph, limiting evaluation of heart size. Aortic atherosclerosis. Mild ill-defined opacity within both lung bases. No evidence of pleural effusion or pneumothorax. No acute bony abnormality identified. IMPRESSION: Shallow inspiration radiograph. Mild ill-defined opacity within both lung bases. While this may reflect atelectasis, pneumonia cannot be excluded. Aortic Atherosclerosis (ICD10-I70.0). Electronically Signed   By: Kellie Simmering D.O.   On: 03/30/2022 09:50    Procedures Procedures    Medications Ordered in ED Medications  iohexol (OMNIPAQUE) 350 MG/ML injection 100 mL (has no administration in time range)  ipratropium-albuterol (DUONEB) 0.5-2.5 (3) MG/3ML nebulizer solution 3 mL (3 mLs Nebulization Given 03/30/22 0959)  guaiFENesin (ROBITUSSIN) 100 MG/5ML liquid 5 mL (5 mLs Oral Given 03/30/22 1150)  methylPREDNISolone sodium succinate (SOLU-MEDROL) 125 mg/2 mL injection 125 mg (125 mg Intravenous Given 03/30/22 1151)    ED Course/ Medical Decision Making/ A&P                           Medical Decision Making Amount and/or Complexity of Data Reviewed Labs: ordered. Radiology: ordered.  Risk OTC drugs. Prescription drug management.   66 year old female presenting with numbness of breath.  O2 sats remained at 100% on room air but she was tachypneic with a respiratory rate in the upper 20s/30s.  She was tachycardic on arrival.  Normotensive, afebrile.  Lung sounds with bilateral wheezes throughout.  Frontal diagnosis includes respiratory infection, pneumonia, PE, pneumothorax, CHF, cardiac tamponade  Labs ordered, reviewed, CBC unremarkable, no leukocytosis.  CMP with mild hypokalemia of 3.3, creatinine 1.4, largely at baseline.  Total bilirubin of 2.1, chronically elevated, no abdominal pain.  VBG with a pH of 7.36, pCO2 53, pO2 43.  Respiratory panel positive for RSV.  Chest x-ray with some atelectasis versus pneumonia. Deltra troponin slightly  elevated but flat. Suspect mild demand.   Patient was placed on a nonrebreather for comfort on arrival, and received several DuoNeb treatments.  She was weaned to nasal cannula, reports feeling like her breathing is much improved.  She received 125 dose of Solu-Medrol and guaifenesin. EKG reviewed, sinus tachycacardia  I had a shared decision-making conversation regarding PE study.  She is positive for RSV which could explain her shortness of breath and her congestion.  We did discuss her history of PE (which she reports was over 20 years ago) and risk vs benefit of contrast nephropathy vs missing PE.  She was tachycardic on arrival however this could be due to acute viral infection and DuoNeb treatments, and she has not been hypoxic throughout her entire ER stay.  After risk vs benefit conversation, she would prefer to forego PE study at this time as she feels much improved after the above treatment.  I will send her home with a short course of steroids, and Mucinex for congestion per her request.  She reports having very little money and also living alone and not driving and not having any way for her medications to be delivered to her.  I discussed this with social work who unfortunately states that they cannot assist her financially given she has insurance.  They recommend I called the pharmacy to have the medications delivered to her.  I spoke with the pharmacy tech at [facility] who reports that they be able to deliver her medications later today.  I discussed all of this with the patient.  She is feeling much improved.  I encouraged PCP follow-up and we discussed tricked return precautions.  She voiced understanding and is agreeable.  Stable for discharge.   Case discussed with Dr. Mayra Neer who is agreeable to the above plan and disposition.   final Clinical Impression(s) / ED Diagnoses Final diagnoses:  RSV (respiratory syncytial virus infection)    Rx / DC Orders ED Discharge Orders          Ordered     predniSONE (DELTASONE) 20 MG tablet  Daily       Note to Pharmacy: Can this medication be delivered to the patient? Address [facility] [id number]   03/30/22 1226    guaiFENesin (MUCINEX) 600 MG 12 hr  tablet  2 times daily       Note to Pharmacy: Can this medication be delivered to the patient? Address [facility] [id number]   03/30/22 1226              Lyndel Safe 03/30/22 1247    Audley Hose, MD 03/30/22 [phone]

## 2022-03-30 NOTE — Progress Notes (Signed)
TOC consulted to assist with cost of/obtaining medications. This CSW informed Verdis Frederickson, [place], that the pt is insured and is ineligible for medication assistance. This CSW noted that the [facility] [place] [facility] and recommended that the PA send medications to [facility] on [address] and request that they be delivered. This CSW verified with toc supervisor and informed Verdis Frederickson, [place], that unfortunately meds to bed is not an option in the ED.

## 2022-03-30 NOTE — ED Triage Notes (Signed)
Patient BIB [facility] c/oSOB since this morning. Cold like symptoms since yesterday. Upon awaking she was wheezing.  Had wheezing in all 4 lung fields but improved after receiving 10 mg albuterol and 0.5 mg Atrovent.  98% RA 98 128/87

## 2022-04-24 ENCOUNTER — Telehealth: Payer: Medicare HMO | Admitting: Physician Assistant

## 2022-04-24 DIAGNOSIS — K047 Periapical abscess without sinus: Secondary | ICD-10-CM

## 2022-04-24 MED ORDER — PEROXYL 1.5 % MT SOLN: OROMUCOSAL | 0 refills | Status: DC

## 2022-04-24 MED ORDER — AMOXICILLIN-POT CLAVULANATE 875-125 MG PO TABS: 1.0000 | ORAL_TABLET | Freq: Two times a day (BID) | ORAL | 0 refills | Status: DC

## 2022-04-24 NOTE — Progress Notes (Signed)
Virtual Visit Consent   Cynthia Cook, you are scheduled for a virtual visit with a [facility] provider today. Just as with appointments in the office, your consent must be obtained to participate. Your consent will be active for this visit and any virtual visit you may have with one of our providers in the next 365 days. If you have a MyChart account, a copy of this consent can be sent to you electronically.  As this is a virtual visit, video technology does not allow for your provider to perform a traditional examination. This may limit your provider's ability to fully assess your condition. If your provider identifies any concerns that need to be evaluated in person or the need to arrange testing (such as labs, EKG, etc.), we will make arrangements to do so. Although advances in technology are sophisticated, we cannot ensure that it will always work on either your end or our end. If the connection with a video visit is poor, the visit may have to be switched to a telephone visit. With either a video or telephone visit, we are not always able to ensure that we have a secure connection.  By engaging in this virtual visit, you consent to the provision of healthcare and authorize for your insurance to be billed (if applicable) for the services provided during this visit. Depending on your insurance coverage, you may receive a charge related to this service.  I need to obtain your verbal consent now. Are you willing to proceed with your visit today? [name] has provided verbal consent on 04/24/2022 for a virtual visit (video or telephone). Cynthia Cook, [place]  Date: 04/24/2022 10:49 AM  Virtual Visit via Video Note   I, Cynthia Cook, connected with  Bani Gianfrancesco  ([id number], [birth year]) on 04/24/22 at 11:15 AM EST by a video-enabled telemedicine application and verified that I am speaking with the correct person using two identifiers.  Location: Patient: Virtual Visit Location  Patient: Home Provider: Virtual Visit Location Provider: Home Office   I discussed the limitations of evaluation and management by telemedicine and the availability of in person appointments. The patient expressed understanding and agreed to proceed.    History of Present Illness: Cynthia Cook is a 67 y.o. who identifies as a female who was assigned female at birth, and is being seen today for concern of dental infection. Has history of bone loss in left lower mandible due to prior dental issues. Very sensitive area chronically for her. Since last night, noting some pain and swelling int he gums in that area. Denies fever, chills. Contacted dentist but they were not able to see her in a timely fashion.   HPI: HPI  Problems:  Patient Active Problem List   Diagnosis Date Noted   Excessive daytime sleepiness 02/10/2022   Elevated blood pressure reading 02/10/2022   Subdural hematoma (HCC)    CVA (cerebral vascular accident) ([place]) 12/30/2021   Elevated blood pressure reading with diagnosis of hypertension 10/26/2021   Urine output low 10/26/2021   Reactive depression (situational) 10/26/2021   Eczema intertrigo 06/28/2021   Varicose veins of both lower extremities 06/28/2021   Financial insecurity 06/28/2021   Encounter for medical examination to establish care 02/17/2021   Stroke ([place]) 01/04/2021   Acute CVA (cerebrovascular accident) ([place]) 01/03/2021   CKD (chronic kidney disease) stage 3, GFR 30-59 ml/min (HCC) - baseline SCr 1.3 01/03/2021   Hashimoto's thyroiditis 06/29/2020   Elevated troponin I level    PAF (paroxysmal atrial  fibrillation) ([place]) 02/17/2020    Allergies:  Allergies  Allergen Reactions   Codeine Nausea Only   Fentanyl Nausea And Vomiting    Dizziness, lightheaded    Medications:  Current Outpatient Medications:    ascorbic acid (VITAMIN C) 500 MG tablet, Take 500 mg by mouth daily., Disp: , Rfl:    aspirin 81 MG chewable tablet, Chew 81 mg by mouth daily.  (Patient not taking: Reported on 02/10/2022), Disp: , Rfl:    clopidogrel (PLAVIX) 75 MG tablet, Take 1 tablet (75 mg total) by mouth daily. Take along with aspirin for 3 weeks, following which stop plavix and continue Aspirin (Patient not taking: Reported on 02/10/2022), Disp: 21 tablet, Rfl: 0   ezetimibe (ZETIA) 10 MG tablet, Take 1 tablet (10 mg total) by mouth daily., Disp: 30 tablet, Rfl: 2   levothyroxine (SYNTHROID) 50 MCG tablet, Take 1 tablet (50 mcg) by mouth on Monday, Wednesday, and Friday, Disp: 45 tablet, Rfl: 11   levothyroxine (SYNTHROID) 75 MCG tablet, Take 1 tablet (75 mcg) by mouth on Tuesday, Thursday, Saturday, and Sunday, Disp: 45 tablet, Rfl: 11   metoprolol succinate (TOPROL-XL) 25 MG 24 hr tablet, Take 25 mg by mouth daily. Pt reports she doesn't take it daily, Disp: , Rfl:    triamcinolone cream (KENALOG) 0.1 %, Apply 1 application. topically daily as needed (rash)., Disp: 30 g, Rfl: 1   Vitamin D-Vitamin K (VITAMIN K2-VITAMIN D3 PO), Take 1 tablet by mouth daily. , Disp: , Rfl:    vitamin E 180 MG (400 UNITS) capsule, Take 400 Units by mouth daily., Disp: , Rfl:   Observations/Objective: Patient is well-developed, well-nourished in no acute distress.  Resting comfortably at home.  Head is normocephalic, atraumatic.  No labored breathing. Speech is clear and coherent with logical content.  Patient is alert and oriented at baseline.   Assessment and Plan: 1. Dental infection  Start Augmentin. Supportive measures and OTC medications reviewed. Follow-up with dental provider as discussed.  Follow Up Instructions: I discussed the assessment and treatment plan with the patient. The patient was provided an opportunity to ask questions and all were answered. The patient agreed with the plan and demonstrated an understanding of the instructions.  A copy of instructions were sent to the patient via MyChart unless otherwise noted below.   The patient was advised to call back  or seek an in-person evaluation if the symptoms worsen or if the condition fails to improve as anticipated.  Time:  I spent 10 minutes with the patient via telehealth technology discussing the above problems/concerns.    Cynthia Rio, PA-C

## 2022-04-24 NOTE — Patient Instructions (Addendum)
[name], thank you for joining [name], PA-C for today's virtual visit.  While this provider is not your primary care provider (PCP), if your PCP is located in our provider database this encounter information will be shared with them immediately following your visit.   [facility] account gives you access to today's visit and all your visits, tests, and labs performed at [facility] " click here if you don't have a [facility] account or go to [email or web address]  Consent: (Patient) Cynthia Cook provided verbal consent for this virtual visit at the beginning of the encounter.  Current Medications:  Current Outpatient Medications:    ascorbic acid (VITAMIN C) 500 MG tablet, Take 500 mg by mouth daily., Disp: , Rfl:    aspirin 81 MG chewable tablet, Chew 81 mg by mouth daily. (Patient not taking: Reported on 02/10/2022), Disp: , Rfl:    clopidogrel (PLAVIX) 75 MG tablet, Take 1 tablet (75 mg total) by mouth daily. Take along with aspirin for 3 weeks, following which stop plavix and continue Aspirin (Patient not taking: Reported on 02/10/2022), Disp: 21 tablet, Rfl: 0   ezetimibe (ZETIA) 10 MG tablet, Take 1 tablet (10 mg total) by mouth daily., Disp: 30 tablet, Rfl: 2   levothyroxine (SYNTHROID) 50 MCG tablet, Take 1 tablet (50 mcg) by mouth on Monday, Wednesday, and Friday, Disp: 45 tablet, Rfl: 11   levothyroxine (SYNTHROID) 75 MCG tablet, Take 1 tablet (75 mcg) by mouth on Tuesday, Thursday, Saturday, and Sunday, Disp: 45 tablet, Rfl: 11   metoprolol succinate (TOPROL-XL) 25 MG 24 hr tablet, Take 25 mg by mouth daily. Pt reports she doesn't take it daily, Disp: , Rfl:    triamcinolone cream (KENALOG) 0.1 %, Apply 1 application. topically daily as needed (rash)., Disp: 30 g, Rfl: 1   Vitamin D-Vitamin K (VITAMIN K2-VITAMIN D3 PO), Take 1 tablet by mouth daily. , Disp: , Rfl:    vitamin E 180 MG (400 UNITS) capsule, Take 400 Units by mouth  daily., Disp: , Rfl:    Medications ordered in this encounter:  No orders of the defined types were placed in this encounter.    *If you need refills on other medications prior to your next appointment, please contact your pharmacy*  Follow-Up: Call back or seek an in-person evaluation if the symptoms worsen or if the condition fails to improve as anticipated.  [place] [phone]  Other Instructions  We are sorry that you are not feeling well.  Here is how we plan to help!  Based on what you have shared with me in the questionnaire, it sounds like you have a dental infection. I have prescribed  Augmentin 875-'125mg'$  twice a day for 10 days  It is imperative that you see a dentist within 10 days of this visit to determine the cause of the dental pain and be sure it is adequately treated  A toothache or tooth pain is caused when the nerve in the root of a tooth or surrounding a tooth is irritated. Dental (tooth) infection, decay, injury, or loss of a tooth are the most common causes of dental pain. Pain may also occur after an extraction (tooth is pulled out). Pain sometimes originates from other areas and radiates to the jaw, thus appearing to be tooth pain.Bacteria growing inside your mouth can contribute to gum disease and dental decay, both of which can cause pain. A toothache occurs from inflammation of the central portion of the  tooth called pulp. The pulp contains nerve endings that are very sensitive to pain. Inflammation to the pulp or pulpitis may be caused by dental cavities, trauma, and infection.    HOME CARE:   For toothaches: Over-the-counter pain medications such as acetaminophen or ibuprofen may be used. Take these as directed on the package while you arrange for a dental appointment. Avoid very cold or hot foods, because they may make the pain worse. You may get relief from biting on a cotton ball soaked in oil of cloves. You can get oil of cloves at  most drug stores.  For jaw pain:  Aspirin may be helpful for problems in the joint of the jaw in adults. If pain happens every time you open your mouth widely, the temporomandibular joint (TMJ) may be the source of the pain. Yawning or taking a large bite of food may worsen the pain. An appointment with your doctor or dentist will help you find the cause.     GET HELP RIGHT AWAY IF:  You have a high fever or chills If you have had a recent head or face injury and develop headache, light headedness, nausea, vomiting, or other symptoms that concern you after an injury to your face or mouth, you could have a more serious injury in addition to your dental injury. A facial rash associated with a toothache: This condition may improve with medication. Contact your doctor for them to decide what is appropriate. Any jaw pain occurring with chest pain: Although jaw pain is most commonly caused by dental disease, it is sometimes referred pain from other areas. People with heart disease, especially people who have had stents placed, people with diabetes, or those who have had heart surgery may have jaw pain as a symptom of heart attack or angina. If your jaw or tooth pain is associated with lightheadedness, sweating, or shortness of breath, you should see a doctor as soon as possible. Trouble swallowing or excessive pain or bleeding from gums: If you have a history of a weakened immune system, diabetes, or steroid use, you may be more susceptible to infections. Infections can often be more severe and extensive or caused by unusual organisms. Dental and gum infections in people with these conditions may require more aggressive treatment. An abscess may need draining or IV antibiotics, for example.  MAKE SURE YOU   Understand these instructions. Will watch your condition. Will get help right away if you are not doing well or get worse.    If you have been instructed to have an in-person evaluation today at  a local Urgent Care facility, please use the link below. It will take you to a list of all of our available [facility], including address, phone number and hours of operation. Please do not delay care.  [facility]  If you or a family member do not have a primary care provider, use the link below to schedule a visit and establish care. When you choose a [facility] primary care physician or advanced practice provider, you gain a long-term partner in health. Find a Primary Care Provider  Learn more about [facility]'s in-office and virtual care options: [facility]

## 2022-04-25 ENCOUNTER — Telehealth: Payer: Self-pay | Admitting: Physician Assistant

## 2022-04-25 ENCOUNTER — Telehealth: Payer: Medicare HMO

## 2022-04-25 DIAGNOSIS — K047 Periapical abscess without sinus: Secondary | ICD-10-CM

## 2022-04-25 MED ORDER — AMOXICILLIN-POT CLAVULANATE 875-125 MG PO TABS: 1.0000 | ORAL_TABLET | Freq: Two times a day (BID) | ORAL | 0 refills | Status: DC

## 2022-04-25 NOTE — Telephone Encounter (Signed)
Patient requesting branded Augmentin as she is certain this is different from generic amoxicillin-clavulanate at same dose. Discussed that these are, in fact, the same medication but happy to send in branded version if that makes her more comfortable. Rx resent to pharmacy.

## 2022-04-26 ENCOUNTER — Other Ambulatory Visit: Payer: Self-pay | Admitting: Physician Assistant

## 2022-04-26 DIAGNOSIS — K047 Periapical abscess without sinus: Secondary | ICD-10-CM

## 2022-04-29 ENCOUNTER — Emergency Department (HOSPITAL_COMMUNITY): Payer: Medicare HMO

## 2022-04-29 ENCOUNTER — Inpatient Hospital Stay (HOSPITAL_COMMUNITY)
Admission: EM | Admit: 2022-04-29 | Discharge: 2022-05-05 | DRG: 175 | Disposition: A | Discharge status: Skilled Nursing Facility | Payer: Medicare HMO | Attending: Family Medicine | Admitting: Family Medicine

## 2022-04-29 ENCOUNTER — Other Ambulatory Visit: Payer: Self-pay

## 2022-04-29 ENCOUNTER — Inpatient Hospital Stay (HOSPITAL_COMMUNITY): Payer: Medicare HMO

## 2022-04-29 DIAGNOSIS — Z743 Need for continuous supervision: Secondary | ICD-10-CM | POA: Diagnosis not present

## 2022-04-29 DIAGNOSIS — E1122 Type 2 diabetes mellitus with diabetic chronic kidney disease: Secondary | ICD-10-CM | POA: Diagnosis present

## 2022-04-29 DIAGNOSIS — N1831 Chronic kidney disease, stage 3a: Secondary | ICD-10-CM | POA: Diagnosis not present

## 2022-04-29 DIAGNOSIS — M4316 Spondylolisthesis, lumbar region: Secondary | ICD-10-CM | POA: Diagnosis not present

## 2022-04-29 DIAGNOSIS — E1151 Type 2 diabetes mellitus with diabetic peripheral angiopathy without gangrene: Secondary | ICD-10-CM | POA: Diagnosis present

## 2022-04-29 DIAGNOSIS — I4891 Unspecified atrial fibrillation: Secondary | ICD-10-CM

## 2022-04-29 DIAGNOSIS — Z888 Allergy status to other drugs, medicaments and biological substances status: Secondary | ICD-10-CM

## 2022-04-29 DIAGNOSIS — J9811 Atelectasis: Secondary | ICD-10-CM | POA: Diagnosis not present

## 2022-04-29 DIAGNOSIS — E7800 Pure hypercholesterolemia, unspecified: Secondary | ICD-10-CM | POA: Diagnosis present

## 2022-04-29 DIAGNOSIS — I700 Atherosclerosis of aorta: Secondary | ICD-10-CM | POA: Diagnosis not present

## 2022-04-29 DIAGNOSIS — Z86711 Personal history of pulmonary embolism: Secondary | ICD-10-CM | POA: Diagnosis not present

## 2022-04-29 DIAGNOSIS — R202 Paresthesia of skin: Secondary | ICD-10-CM

## 2022-04-29 DIAGNOSIS — I4892 Unspecified atrial flutter: Secondary | ICD-10-CM | POA: Diagnosis not present

## 2022-04-29 DIAGNOSIS — Z8673 Personal history of transient ischemic attack (TIA), and cerebral infarction without residual deficits: Secondary | ICD-10-CM

## 2022-04-29 DIAGNOSIS — Z7982 Long term (current) use of aspirin: Secondary | ICD-10-CM

## 2022-04-29 DIAGNOSIS — M109 Gout, unspecified: Secondary | ICD-10-CM | POA: Diagnosis not present

## 2022-04-29 DIAGNOSIS — M4856XA Collapsed vertebra, not elsewhere classified, lumbar region, initial encounter for fracture: Secondary | ICD-10-CM | POA: Diagnosis not present

## 2022-04-29 DIAGNOSIS — I5082 Biventricular heart failure: Secondary | ICD-10-CM | POA: Diagnosis not present

## 2022-04-29 DIAGNOSIS — D649 Anemia, unspecified: Secondary | ICD-10-CM

## 2022-04-29 DIAGNOSIS — J90 Pleural effusion, not elsewhere classified: Secondary | ICD-10-CM | POA: Diagnosis not present

## 2022-04-29 DIAGNOSIS — I130 Hypertensive heart and chronic kidney disease with heart failure and stage 1 through stage 4 chronic kidney disease, or unspecified chronic kidney disease: Secondary | ICD-10-CM | POA: Diagnosis not present

## 2022-04-29 DIAGNOSIS — I2699 Other pulmonary embolism without acute cor pulmonale: Secondary | ICD-10-CM | POA: Diagnosis not present

## 2022-04-29 DIAGNOSIS — I083 Combined rheumatic disorders of mitral, aortic and tricuspid valves: Secondary | ICD-10-CM | POA: Diagnosis present

## 2022-04-29 DIAGNOSIS — I5043 Acute on chronic combined systolic (congestive) and diastolic (congestive) heart failure: Secondary | ICD-10-CM

## 2022-04-29 DIAGNOSIS — N1832 Chronic kidney disease, stage 3b: Secondary | ICD-10-CM | POA: Diagnosis present

## 2022-04-29 DIAGNOSIS — N183 Chronic kidney disease, stage 3 unspecified: Secondary | ICD-10-CM | POA: Diagnosis present

## 2022-04-29 DIAGNOSIS — M810 Age-related osteoporosis without current pathological fracture: Secondary | ICD-10-CM | POA: Diagnosis present

## 2022-04-29 DIAGNOSIS — R7989 Other specified abnormal findings of blood chemistry: Secondary | ICD-10-CM | POA: Diagnosis present

## 2022-04-29 DIAGNOSIS — I509 Heart failure, unspecified: Secondary | ICD-10-CM

## 2022-04-29 DIAGNOSIS — R29818 Other symptoms and signs involving the nervous system: Secondary | ICD-10-CM | POA: Diagnosis not present

## 2022-04-29 DIAGNOSIS — G8929 Other chronic pain: Secondary | ICD-10-CM | POA: Diagnosis not present

## 2022-04-29 DIAGNOSIS — I4819 Other persistent atrial fibrillation: Secondary | ICD-10-CM | POA: Diagnosis not present

## 2022-04-29 DIAGNOSIS — E039 Hypothyroidism, unspecified: Secondary | ICD-10-CM | POA: Diagnosis not present

## 2022-04-29 DIAGNOSIS — I5041 Acute combined systolic (congestive) and diastolic (congestive) heart failure: Secondary | ICD-10-CM

## 2022-04-29 DIAGNOSIS — Z7902 Long term (current) use of antithrombotics/antiplatelets: Secondary | ICD-10-CM

## 2022-04-29 DIAGNOSIS — Z885 Allergy status to narcotic agent status: Secondary | ICD-10-CM

## 2022-04-29 DIAGNOSIS — D631 Anemia in chronic kidney disease: Secondary | ICD-10-CM | POA: Diagnosis present

## 2022-04-29 DIAGNOSIS — R0989 Other specified symptoms and signs involving the circulatory and respiratory systems: Secondary | ICD-10-CM | POA: Diagnosis not present

## 2022-04-29 DIAGNOSIS — S065XAA Traumatic subdural hemorrhage with loss of consciousness status unknown, initial encounter: Secondary | ICD-10-CM | POA: Diagnosis present

## 2022-04-29 DIAGNOSIS — I3139 Other pericardial effusion (noninflammatory): Secondary | ICD-10-CM

## 2022-04-29 DIAGNOSIS — I110 Hypertensive heart disease with heart failure: Secondary | ICD-10-CM | POA: Diagnosis not present

## 2022-04-29 DIAGNOSIS — Z79899 Other long term (current) drug therapy: Secondary | ICD-10-CM

## 2022-04-29 DIAGNOSIS — R531 Weakness: Secondary | ICD-10-CM | POA: Diagnosis not present

## 2022-04-29 DIAGNOSIS — Z886 Allergy status to analgesic agent status: Secondary | ICD-10-CM

## 2022-04-29 DIAGNOSIS — Z79890 Hormone replacement therapy (postmenopausal): Secondary | ICD-10-CM

## 2022-04-29 DIAGNOSIS — I429 Cardiomyopathy, unspecified: Secondary | ICD-10-CM | POA: Diagnosis not present

## 2022-04-29 DIAGNOSIS — S32000A Wedge compression fracture of unspecified lumbar vertebra, initial encounter for closed fracture: Secondary | ICD-10-CM | POA: Clinically undetermined

## 2022-04-29 DIAGNOSIS — R5383 Other fatigue: Secondary | ICD-10-CM | POA: Diagnosis not present

## 2022-04-29 DIAGNOSIS — I2609 Other pulmonary embolism with acute cor pulmonale: Secondary | ICD-10-CM | POA: Diagnosis not present

## 2022-04-29 DIAGNOSIS — I480 Paroxysmal atrial fibrillation: Secondary | ICD-10-CM | POA: Diagnosis not present

## 2022-04-29 DIAGNOSIS — J9601 Acute respiratory failure with hypoxia: Secondary | ICD-10-CM | POA: Diagnosis not present

## 2022-04-29 DIAGNOSIS — I2510 Atherosclerotic heart disease of native coronary artery without angina pectoris: Secondary | ICD-10-CM | POA: Diagnosis present

## 2022-04-29 DIAGNOSIS — M79672 Pain in left foot: Secondary | ICD-10-CM | POA: Diagnosis not present

## 2022-04-29 DIAGNOSIS — M79671 Pain in right foot: Secondary | ICD-10-CM | POA: Diagnosis not present

## 2022-04-29 LAB — CBC
HCT: 34.9 % — ABNORMAL LOW (ref 36.0–46.0)
Hemoglobin: 10.7 g/dL — ABNORMAL LOW (ref 12.0–15.0)
MCH: 27.8 pg (ref 26.0–34.0)
MCHC: 30.7 g/dL (ref 30.0–36.0)
MCV: 90.6 fL (ref 80.0–100.0)
Platelets: 257 10*3/uL (ref 150–400)
RBC: 3.85 MIL/uL — ABNORMAL LOW (ref 3.87–5.11)
RDW: 14.9 % (ref 11.5–15.5)
WBC: 6.1 10*3/uL (ref 4.0–10.5)
nRBC: 0 % (ref 0.0–0.2)

## 2022-04-29 LAB — COMPREHENSIVE METABOLIC PANEL
ALT: 23 U/L (ref 0–44)
AST: 24 U/L (ref 15–41)
Albumin: 3 g/dL — ABNORMAL LOW (ref 3.5–5.0)
Alkaline Phosphatase: 66 U/L (ref 38–126)
Anion gap: 11 (ref 5–15)
BUN: 46 mg/dL — ABNORMAL HIGH (ref 8–23)
CO2: 26 mmol/L (ref 22–32)
Calcium: 9.1 mg/dL (ref 8.9–10.3)
Chloride: 105 mmol/L (ref 98–111)
Creatinine, Ser: 1.37 mg/dL — ABNORMAL HIGH (ref 0.44–1.00)
GFR, Estimated: 42 mL/min — ABNORMAL LOW (ref >60)
Glucose, Bld: 96 mg/dL (ref 70–99)
Potassium: 4.4 mmol/L (ref 3.5–5.1)
Sodium: 142 mmol/L (ref 135–145)
Total Bilirubin: 0.7 mg/dL (ref 0.3–1.2)
Total Protein: 6.8 g/dL (ref 6.5–8.1)

## 2022-04-29 LAB — URINALYSIS, ROUTINE W REFLEX MICROSCOPIC
Bilirubin Urine: NEGATIVE
Glucose, UA: NEGATIVE mg/dL
Hgb urine dipstick: NEGATIVE
Ketones, ur: NEGATIVE mg/dL
Nitrite: NEGATIVE
Protein, ur: NEGATIVE mg/dL
Specific Gravity, Urine: 1.015 (ref 1.005–1.030)
pH: 5 (ref 5.0–8.0)

## 2022-04-29 LAB — TROPONIN I (HIGH SENSITIVITY)
Troponin I (High Sensitivity): 24 ng/L — ABNORMAL HIGH (ref <18)
Troponin I (High Sensitivity): 24 ng/L — ABNORMAL HIGH (ref <18)

## 2022-04-29 LAB — BRAIN NATRIURETIC PEPTIDE: B Natriuretic Peptide: 580.5 pg/mL — ABNORMAL HIGH (ref 0.0–100.0)

## 2022-04-29 LAB — LACTIC ACID, PLASMA: Lactic Acid, Venous: 0.9 mmol/L (ref 0.5–1.9)

## 2022-04-29 LAB — TSH: TSH: 4.812 u[IU]/mL — ABNORMAL HIGH (ref 0.350–4.500)

## 2022-04-29 MED ORDER — ALBUTEROL SULFATE (2.5 MG/3ML) 0.083% IN NEBU: 2.5000 mg | INHALATION_SOLUTION | Freq: Four times a day (QID) | RESPIRATORY_TRACT | Status: DC | PRN

## 2022-04-29 MED ORDER — CLOPIDOGREL BISULFATE 75 MG PO TABS: 75.0000 mg | ORAL_TABLET | Freq: Every day | ORAL | Status: DC

## 2022-04-29 MED ORDER — ACETAMINOPHEN 325 MG PO TABS: 650.0000 mg | ORAL_TABLET | Freq: Four times a day (QID) | ORAL | Status: DC | PRN

## 2022-04-29 MED ORDER — ACETAMINOPHEN 650 MG RE SUPP: 650.0000 mg | Freq: Four times a day (QID) | RECTAL | Status: DC | PRN

## 2022-04-29 MED ORDER — ONDANSETRON HCL 4 MG PO TABS: 4.0000 mg | ORAL_TABLET | Freq: Four times a day (QID) | ORAL | Status: DC | PRN

## 2022-04-29 MED ORDER — LEVOTHYROXINE SODIUM 75 MCG PO TABS
75.0000 ug | ORAL_TABLET | ORAL | Status: DC
  Administered 2022-04-30 – 2022-05-04 (×3): 75 ug via ORAL
  Filled 2022-04-29 (×3): qty 1

## 2022-04-29 MED ORDER — HEPARIN (PORCINE) 25000 UT/250ML-% IV SOLN: 1250.0000 [IU]/h | INTRAVENOUS | Status: DC

## 2022-04-29 MED ORDER — IOHEXOL 350 MG/ML SOLN
75.0000 mL | Freq: Once | INTRAVENOUS | Status: AC | PRN
  Administered 2022-04-29: 75 mL via INTRAVENOUS

## 2022-04-29 MED ORDER — ENOXAPARIN SODIUM 40 MG/0.4ML IJ SOSY: 40.0000 mg | PREFILLED_SYRINGE | INTRAMUSCULAR | Status: DC

## 2022-04-29 MED ORDER — FUROSEMIDE 10 MG/ML IJ SOLN
40.0000 mg | Freq: Two times a day (BID) | INTRAMUSCULAR | Status: DC
  Administered 2022-04-30 – 2022-05-03 (×7): 40 mg via INTRAVENOUS
  Filled 2022-04-29 (×7): qty 4

## 2022-04-29 MED ORDER — FUROSEMIDE 10 MG/ML IJ SOLN
40.0000 mg | Freq: Once | INTRAMUSCULAR | Status: AC
  Administered 2022-04-29: 40 mg via INTRAVENOUS
  Filled 2022-04-29: qty 4

## 2022-04-29 MED ORDER — LIDOCAINE 5 % EX PTCH
1.0000 | MEDICATED_PATCH | CUTANEOUS | Status: DC
  Administered 2022-04-29: 1 via TRANSDERMAL
  Filled 2022-04-29 (×4): qty 1

## 2022-04-29 MED ORDER — AMIODARONE IV BOLUS ONLY 150 MG/100ML: 150.0000 mg | Freq: Once | INTRAVENOUS | Status: DC

## 2022-04-29 MED ORDER — METOPROLOL SUCCINATE ER 25 MG PO TB24: 25.0000 mg | ORAL_TABLET | Freq: Every day | ORAL | Status: DC

## 2022-04-29 MED ORDER — MORPHINE SULFATE (PF) 2 MG/ML IV SOLN
2.0000 mg | INTRAVENOUS | Status: DC | PRN
  Administered 2022-04-29: 2 mg via INTRAVENOUS
  Filled 2022-04-29: qty 1

## 2022-04-29 MED ORDER — METOPROLOL TARTRATE 12.5 MG HALF TABLET
12.5000 mg | ORAL_TABLET | Freq: Four times a day (QID) | ORAL | Status: DC
  Filled 2022-04-29 (×2): qty 1

## 2022-04-29 MED ORDER — ONDANSETRON HCL 4 MG/2ML IJ SOLN
4.0000 mg | Freq: Four times a day (QID) | INTRAMUSCULAR | Status: DC | PRN
  Administered 2022-04-30: 4 mg via INTRAVENOUS
  Filled 2022-04-29: qty 2

## 2022-04-29 MED ORDER — APIXABAN 5 MG PO TABS: 5.0000 mg | ORAL_TABLET | Freq: Two times a day (BID) | ORAL | Status: DC

## 2022-04-29 MED ORDER — TRAMADOL HCL 50 MG PO TABS
50.0000 mg | ORAL_TABLET | Freq: Once | ORAL | Status: AC
  Administered 2022-04-29: 50 mg via ORAL
  Filled 2022-04-29: qty 1

## 2022-04-29 MED ORDER — TRAMADOL HCL 50 MG PO TABS
50.0000 mg | ORAL_TABLET | Freq: Four times a day (QID) | ORAL | Status: DC
  Administered 2022-04-29 – 2022-04-30 (×2): 50 mg via ORAL
  Filled 2022-04-29 (×3): qty 1

## 2022-04-29 MED ORDER — SODIUM CHLORIDE 0.9% FLUSH
3.0000 mL | Freq: Two times a day (BID) | INTRAVENOUS | Status: DC
  Administered 2022-04-29 – 2022-05-03 (×8): 3 mL via INTRAVENOUS

## 2022-04-29 MED ORDER — HEPARIN (PORCINE) 25000 UT/250ML-% IV SOLN
1400.0000 [IU]/h | INTRAVENOUS | Status: DC
  Administered 2022-04-29: 1250 [IU]/h via INTRAVENOUS
  Administered 2022-04-30 – 2022-05-01 (×2): 1400 [IU]/h via INTRAVENOUS
  Filled 2022-04-29 (×3): qty 250

## 2022-04-29 MED ORDER — LEVOTHYROXINE SODIUM 50 MCG PO TABS
50.0000 ug | ORAL_TABLET | ORAL | Status: DC
  Administered 2022-05-01 – 2022-05-05 (×3): 50 ug via ORAL
  Filled 2022-04-29 (×3): qty 1

## 2022-04-29 NOTE — ED Provider Triage Note (Addendum)
Emergency Medicine Provider Triage Evaluation Note  Cynthia Cook , a 67 y.o. female  was evaluated in triage.  Pt complains of back pain, numbness in the plantar aspects of feet (BL), balance issues and has started to use a cane   She had a fall 12/14 and states she had an MRI L spine done that showed an L2 compression fracture. She states since then she's had more difficulty walking.  She's been eating more salty foods and has noticed more foot swelling BL.   No CP or SOB.   No new sx in the past 24 hours.   She feels she is simply progressively worsening and weakening.     Review of Systems  Positive: Back pain, weakness Negative: Fever   Physical Exam  BP 124/88   Pulse (!) 114   Temp 97.9 F (36.6 C)   Resp 18   SpO2 99%  Gen:   Awake, no distress   Resp:  Normal effort  MSK:   Moves extremities without difficulty  Other:  BL pitting edema in LE.  Smile symmetric, diffuse weakness in extremities. Speech clear.    Medical Decision Making  Medically screening exam initiated at 6:12 AM.  Appropriate orders placed.  Cynthia Cook was informed that the remainder of the evaluation will be completed by another provider, this initial triage assessment does not replace that evaluation, and the importance of remaining in the ED until their evaluation is complete.  Labs, urine EKG, covid   She lives alone seems to have poor ability to take care of herself. Will put a PT eval and TOC consult in.    Tedd Sias, [place] 04/29/22 0620    Tedd Sias, PA 04/29/22 [phone]

## 2022-04-29 NOTE — ED Notes (Signed)
ED TO INPATIENT HANDOFF REPORT  ED Nurse Name and Phone #: [phone]  S Name/Age/Gender Cynthia Cook 67 y.o. female Room/Bed: 034C/034C  Code Status   Code Status: Full Code  Home/SNF/Other Home Patient oriented to: self, place, time, and situation Is this baseline? Yes   Triage Complete: Triage complete  Chief Complaint CHF (congestive heart failure) ([place]) [I50.9]  Triage Note Patient arrived with EMS from home reports " decreased sensation " at bilateral lower legs and feet onset last month after a fall . Uses a cane to ambulate .    Allergies Allergies  Allergen Reactions   Codeine Nausea Only   Duragesic-100 [Fentanyl] Nausea And Vomiting    Dizziness Lightheadedness    Mucinex [Guaifenesin Er] Other (See Comments)    Syncope    Nsaids Other (See Comments)    Kidney disease    Level of Care/Admitting Diagnosis ED Disposition     ED Disposition  Admit   Condition  --   Cynthia: Cook [[id number]]  Level of Care: Telemetry Cardiac [103]  May admit patient to [facility] or [facility] if equivalent level of care is available:: No  Covid Evaluation: Symptomatic Person Under Investigation (PUI) or recent exposure (last 10 days) *Testing Required*  Diagnosis: CHF (congestive heart failure) Cynthia Cook) [[id number]]  Admitting Physician: Norval Morton [[id number]]  Attending Physician: Norval Morton [[id number]]  Certification:: I certify this patient will need inpatient services for at least 2 midnights  Estimated Length of Stay: 2          B Medical/Surgery History Past Medical History:  Diagnosis Date   Back pain    Gout    Murmur    PE (pulmonary embolism)    Persistent atrial fibrillation ([place])    Pulmonary emboli ([place]) 02/05/2020   Syncope    Thyroid disease    No past surgical history on file.   A IV Location/Drains/Wounds Patient Lines/Drains/Airways Status     Active Line/Drains/Airways     Name Placement  date Placement time Site Days   Peripheral IV 04/29/22 20 G Distal;Posterior;Right Forearm 04/29/22  1515  Forearm  less than 1   Peripheral IV 04/29/22 20 G Anterior;Left;Proximal Forearm 04/29/22  1615  Forearm  less than 1            Intake/Output Last 24 hours No intake or output data in the 24 hours ending 04/29/22 1814  Labs/Imaging Results for orders placed or performed during the Cook encounter of 04/29/22 (from the past 48 hour(s))  Comprehensive metabolic panel     Status: Abnormal   Collection Time: 04/29/22  6:39 AM  Result Value Ref Range   Sodium 142 135 - 145 mmol/L   Potassium 4.4 3.5 - 5.1 mmol/L   Chloride 105 98 - 111 mmol/L   CO2 26 22 - 32 mmol/L   Glucose, Bld 96 70 - 99 mg/dL    Comment: Glucose reference range applies only to samples taken after fasting for at least 8 hours.   BUN 46 (H) 8 - 23 mg/dL   Creatinine, Ser 1.37 (H) 0.44 - 1.00 mg/dL   Calcium 9.1 8.9 - 10.3 mg/dL   Total Protein 6.8 6.5 - 8.1 g/dL   Albumin 3.0 (L) 3.5 - 5.0 g/dL   AST 24 15 - 41 U/L   ALT 23 0 - 44 U/L   Alkaline Phosphatase 66 38 - 126 U/L   Total Bilirubin 0.7 0.3 - 1.2 mg/dL  GFR, Estimated 42 (L) >60 mL/min    Comment: (NOTE) Calculated using the CKD-EPI Creatinine Equation (2021)    Anion gap 11 5 - 15    Comment: Performed at [facility], [place] [address] [place], [place] [address]  CBC     Status: Abnormal   Collection Time: 04/29/22  6:39 AM  Result Value Ref Range   WBC 6.1 4.0 - 10.5 K/uL   RBC 3.85 (L) 3.87 - 5.11 MIL/uL   Hemoglobin 10.7 (L) 12.0 - 15.0 g/dL   HCT 34.9 (L) 36.0 - 46.0 %   MCV 90.6 80.0 - 100.0 fL   MCH 27.8 26.0 - 34.0 pg   MCHC 30.7 30.0 - 36.0 g/dL   RDW 14.9 11.5 - 15.5 %   Platelets 257 150 - 400 K/uL   nRBC 0.0 0.0 - 0.2 %    Comment: Performed at [facility], Cynthia [address] [place], [place] [address]  Lactic acid, plasma     Status: None   Collection Time: 04/29/22  6:39 AM  Result Value Ref Range    Lactic Acid, Venous 0.9 0.5 - 1.9 mmol/L    Comment: Performed at [facility] [address] [place], [place] [address]  Troponin I (High Sensitivity)     Status: Abnormal   Collection Time: 04/29/22  6:39 AM  Result Value Ref Range   Troponin I (High Sensitivity) 24 (H) <18 ng/L    Comment: (NOTE) Elevated high sensitivity troponin I (hsTnI) values and significant  changes across serial measurements may suggest ACS but many other  chronic and acute conditions are known to elevate hsTnI results.  Refer to the "Links" section for chest pain algorithms and additional  guidance. Performed at [facility], [place] [address] [place], [place] [address]   Brain natriuretic peptide     Status: Abnormal   Collection Time: 04/29/22  6:39 AM  Result Value Ref Range   B Natriuretic Peptide 580.5 (H) 0.0 - 100.0 pg/mL    Comment: Performed at [facility] [address] [place], [place] [address]  Culture, blood (single)     Status: None (Preliminary result)   Collection Time: 04/29/22  6:40 AM   Specimen: BLOOD LEFT ARM  Result Value Ref Range   Specimen Description BLOOD LEFT ARM    Special Requests      BOTTLES DRAWN AEROBIC AND ANAEROBIC Blood Culture adequate volume   Culture      NO GROWTH < 12 HOURS Performed at [facility], [place] [address] [place], [place] [address]    Report Status PENDING   Troponin I (High Sensitivity)     Status: Abnormal   Collection Time: 04/29/22  8:49 AM  Result Value Ref Range   Troponin I (High Sensitivity) 24 (H) <18 ng/L    Comment: (NOTE) Elevated high sensitivity troponin I (hsTnI) values and significant  changes across serial measurements may suggest ACS but many other  chronic and acute conditions are known to elevate hsTnI results.  Refer to the "Links" section for chest pain algorithms and additional  guidance. Performed at [facility], [place] [facility] [address] [place], [place] [address]    CT Angio Chest PE W/Cm  &/Or Wo Cm  Result Date: 04/29/2022 CLINICAL DATA:  Suspected pulmonary embolism. EXAM: CT ANGIOGRAPHY CHEST WITH CONTRAST TECHNIQUE: Multidetector CT imaging of the chest was performed using the standard protocol during bolus administration of intravenous contrast. Multiplanar  CT image reconstructions and MIPs were obtained to evaluate the vascular anatomy. RADIATION DOSE REDUCTION: This exam was performed according to the departmental dose-optimization program which includes automated exposure control, adjustment of the mA and/or kV according to patient size and/or use of iterative reconstruction technique. CONTRAST:  30m OMNIPAQUE IOHEXOL 350 MG/ML SOLN COMPARISON:  February 27, 2020 FINDINGS: Cardiovascular: There is marked severity calcification of the thoracic aorta. The ascending thoracic aorta measures 4.3 cm in diameter. Marked severity intraluminal low attenuation is seen within the mid and distal portions of the right pulmonary artery involvement of proximal right middle lobe and right lower lobe branches is seen. There is mild cardiomegaly with marked severity coronary artery calcification. Mild right heart strain is seen (RV/LV ratio of 1.14). No pericardial effusion. Mediastinum/Nodes: No enlarged mediastinal, hilar, or axillary lymph nodes. Thyroid gland, trachea, and esophagus demonstrate no significant findings. Lungs/Pleura: Mild left basilar and moderate severity right basilar atelectasis is seen. Small to moderate size bilateral pleural effusions are noted, right slightly greater than left. No pneumothorax is identified. Upper Abdomen: Multiple surgical clips are seen within the gallbladder fossa. Noninflamed diverticula are seen within the visualized portion of the splenic flexure. Musculoskeletal: Multilevel degenerative changes are seen throughout the thoracic spine. Review of the MIP images confirms the above findings. IMPRESSION: 1. Marked severity right-sided pulmonary embolism with mild  right heart strain (RV/LV ratio of 1.14). 2. Mild left basilar and moderate severity right basilar atelectasis. 3. Small to moderate size bilateral pleural effusions, right slightly greater than left. 4. Colonic diverticulosis. 5. Evidence of prior cholecystectomy. 6. Aortic atherosclerosis. Aortic Atherosclerosis (ICD10-I70.0). Electronically Signed   By: TVirgina NorfolkM.D.   On: 04/29/2022 17:43   DG Chest 2 View  Result Date: 04/29/2022 CLINICAL DATA:  Fatigue EXAM: CHEST - 2 VIEW COMPARISON:  03/30/2022 FINDINGS: Small bilateral pleural effusion. No visible edema or air bronchogram. Bulky mitral annular calcification. Normal heart size and stable mediastinal contours. IMPRESSION: Small bilateral pleural effusion. Electronically Signed   By: JJorje GuildM.D.   On: 04/29/2022 07:34   CT HEAD WO CONTRAST (5MM)  Result Date: 04/29/2022 CLINICAL DATA:  Neuro deficit with acute stroke suspected EXAM: CT HEAD WITHOUT CONTRAST TECHNIQUE: Contiguous axial images were obtained from the base of the skull through the vertex without intravenous contrast. RADIATION DOSE REDUCTION: This exam was performed according to the departmental dose-optimization program which includes automated exposure control, adjustment of the mA and/or kV according to patient size and/or use of iterative reconstruction technique. COMPARISON:  Brain MRI 12/30/2021 FINDINGS: Brain: No evidence of acute infarction, hemorrhage, hydrocephalus, or mass lesion/mass effect. Chronic small vessel infarcts in the bilateral cerebellum, bilateral caudate, and left corona radiata. Subdural collections on prior not confidently seen today. Vascular: No hyperdense vessel or unexpected calcification. Skull: Normal. Negative for fracture or focal lesion. Sinuses/Orbits: No acute finding. IMPRESSION: 1. No acute finding. 2. Chronic infra and supratentorial small vessel infarcts. Electronically Signed   By: JJorje GuildM.D.   On: 04/29/2022 07:14     Pending Labs Unresulted Labs (From admission, onward)     Start     Ordered   05/01/22 0500  Heparin level (unfractionated)  Daily,   R      04/29/22 1814   04/30/22 0500  CBC  Tomorrow morning,   R        04/29/22 1610   04/30/22 [id number] Basic metabolic panel  Tomorrow morning,   R  04/29/22 1610   04/30/22 0500  Magnesium  Tomorrow morning,   R        04/29/22 1802   04/30/22 0200  Heparin level (unfractionated)  Once-Timed,   TIMED        04/29/22 1814   04/29/22 1802  TSH  Add-on,   AD        04/29/22 1802   04/29/22 0621  Urinalysis, Routine w reflex microscopic  Once,   URGENT        04/29/22 0622            Vitals/Pain Today's Vitals   04/29/22 1500 04/29/22 1515 04/29/22 1530 04/29/22 1656  BP: 112/68 131/81 123/88   Pulse: (!) 113 (!) 103 (!) 114   Resp: (!) 27 19 (!) 21   Temp:   98 F (36.7 C)   TempSrc:   Oral   SpO2: 96% 98% 97%   PainSc:    6     Isolation Precautions No active isolations  Medications Medications  sodium chloride flush (NS) 0.9 % injection 3 mL (has no administration in time range)  acetaminophen (TYLENOL) tablet 650 mg (has no administration in time range)    Or  acetaminophen (TYLENOL) suppository 650 mg (has no administration in time range)  ondansetron (ZOFRAN) tablet 4 mg (has no administration in time range)    Or  ondansetron (ZOFRAN) injection 4 mg (has no administration in time range)  albuterol (PROVENTIL) (2.5 MG/3ML) 0.083% nebulizer solution 2.5 mg (has no administration in time range)  furosemide (LASIX) injection 40 mg (has no administration in time range)  traMADol (ULTRAM) tablet 50 mg (has no administration in time range)  morphine (PF) 2 MG/ML injection 2 mg (2 mg Intravenous Given 04/29/22 1652)  lidocaine (LIDODERM) 5 % 1 patch (has no administration in time range)  heparin ADULT infusion 100 units/mL (25000 units/252m) (has no administration in time range)  traMADol (ULTRAM) tablet 50 mg (50 mg Oral  Given 04/29/22 1156)  furosemide (LASIX) injection 40 mg (40 mg Intravenous Given 04/29/22 1534)  iohexol (OMNIPAQUE) 350 MG/ML injection 75 mL (75 mLs Intravenous Contrast Given 04/29/22 1714)    Mobility      Focused Assessments Cardiac Assessment Handoff:    Lab Results  Component Value Date   CKTOTAL 31 (L) 09/26/2018   Lab Results  Component Value Date   DDIMER 0.82 (H) 02/27/2020   Does the Patient currently have chest pain? No    R Recommendations: See Admitting Provider Note  Report given to:   Additional Notes:

## 2022-04-29 NOTE — Progress Notes (Signed)
PT Cancellation Note  Patient Details Name: Cynthia Cook MRN: [id number] DOB: 1956-01-29   Cancelled Treatment:    Reason Eval/Treat Not Completed: Other (comment).  Pt initially agreed to work with PT but then gave PT a tearful explanation of her care and then adamantly refused to move at all with PT.  Will revisit pt at another time.   Ramond Dial 04/29/2022, 2:42 PM  Mee Hives, PT PhD Acute Rehab Dept. Number: [place] [facility] [place]

## 2022-04-29 NOTE — TOC Initial Note (Signed)
Transition of Care [facility]) - Initial/Assessment Note    Patient Details  Name: Cynthia Cook MRN: [id number] Date of Birth: January 24, 1956  [facility] CM/SW Contact:    Verdell Carmine, RN Phone Number: 04/29/2022, 1:27 PM  Clinical Narrative:                 Spoke with patient at bedside. She is currently living alone in a small apartment. She would like to stay at her apartment and is declining SNF, should it be recommended.  She has a four pronged cane and would like to keep it to that since her apartment is so small. She is very anenable to any assistance that can be provided. Called [facility] ( cheryl Rose) who had accepted her through the community. She is active and orders placed . She wll be receiving PT < OT aide, RN, and social work. She is also Arts development officer a [facility] NP to follow with her therapies.   The patient has a tub that is very difficult to get into. Encouraged her to call her insurance company about possible modifications they would pay for. As well , it has been over a year since she applied for medicaid. With permission, message sent to [facility] for medicaid screening.  She needs a shower/tub chair,but is unsure what will fit. She wil look on [employer] and maybe get suggestions from [employer] will continue to follow for needs, recommendations, and transitions of Care  Expected Discharge Plan: [place] Barriers to Discharge: Continued Medical Work up   Patient Goals and CMS Choice Patient states their goals for this hospitalization and ongoing recovery are:: go home and be independent   Choice offered to / list presented to : Patient      Expected Discharge Plan and Services   Discharge Planning Services: CM Consult Post Acute Care Choice: Durable Medical Equipment Living arrangements for the past 2 months: Apartment                           HH Arranged: PT, OT, Nurse's Aide, Social Work [employer] Agency: [place]        Prior Living Arrangements/Services Living arrangements for the past 2 months: Apartment Lives with:: Self Patient language and need for interpreter reviewed:: Yes Do you feel safe going back to the place where you live?: Yes      Need for Family Participation in Patient Care: Yes (Comment) Care giver support system in place?: Yes (comment) Current home services: DME (cane) Criminal Activity/Legal Involvement Pertinent to Current Situation/Hospitalization: No - Comment as needed  Activities of Daily Living      Permission Sought/Granted                  Emotional Assessment Appearance:: Appears stated age            Admission diagnosis:  fall Patient Active Problem List   Diagnosis Date Noted   Excessive daytime sleepiness 02/10/2022   Elevated blood pressure reading 02/10/2022   Subdural hematoma (HCC)    CVA (cerebral vascular accident) ([place]) 12/30/2021   Elevated blood pressure reading with diagnosis of hypertension 10/26/2021   Urine output low 10/26/2021   Reactive depression (situational) 10/26/2021   Eczema intertrigo 06/28/2021   Varicose veins of both lower extremities 06/28/2021   Financial insecurity 06/28/2021   Encounter for medical examination to establish care 02/17/2021   Stroke (  [place]) 01/04/2021   Acute CVA (cerebrovascular accident) ([place]) 01/03/2021   CKD (chronic kidney disease) stage 3, GFR 30-59 ml/min (HCC) - baseline SCr 1.3 01/03/2021   Hashimoto's thyroiditis 06/29/2020   Elevated troponin I level    PAF (paroxysmal atrial fibrillation) ([place]) 02/17/2020   PCP:  Lurline Del, DO Pharmacy:   [id number] [place] [place]Phone: [phone]: [phone]    Social Determinants of Health (SDOH) Social History: SDOH Screenings   Food Insecurity: No Food Insecurity (03/15/2020)  Transportation Needs: No Transportation Needs (06/14/2021)   Depression (PHQ2-9): Medium Risk (02/18/2021)  Financial Resource Strain: High Risk (06/14/2021)  Stress: Stress Concern Present (06/14/2021)  Tobacco Use: Low Risk  (03/30/2022)   SDOH Interventions:     Readmission Risk Interventions     No data to display

## 2022-04-29 NOTE — ED Notes (Signed)
Patient had a moment of agitation due to being told she's on best rest and not able to use the bedside commode. She tried getting up and HR went to 205. Patient put on bedpan and heart rate is 150-170s. MD notified.Will continue to monitor.

## 2022-04-29 NOTE — Progress Notes (Signed)
Patient arrived to the floor via the stretcher alert and oriented and ambulated to the bed with the cane and the nurse standing by. Patient did not want to be helped to transfer to the bed d/t pain in her lower back. Cardiac monitoring initiated, patient oriented to room equipment and staff. We'll continue to monitor.

## 2022-04-29 NOTE — Consult Note (Addendum)
Cardiology Consult Note   Patient ID: Cynthia Cook MRN: [id number]; DOB: [birth year]   Admission date: 04/29/2022  PCP:  Lurline Del, [facility] Cardiologist:  Buford Dresser, MD        Consult Question:  AF management, PE  History of Present Illness:   Cynthia Cook is a 67 y.o. female with a history of paroxymal AF (CHADSVASC 4, not on anticoagulation), thyroid disease, CKD, a history of stroke/TIA and subdural hematoma on imaging (12/2021), chronic back pain and a remote history of PE who presented to the ED on 04/29/2022 for the evaluation of shortness of breath and was found to be in AF with RVR with a large right sided PE on CT imaging.    Patient reports a mechanical fall in mid December and was subsequently diagnosed with a compression fracture of her L2 vertebrae at [facility].  She was then diagnosed with RSV about a week later and has suffered a progressive decline oftentimes finding herself confined to bed. She reports that over this time span, she has noted some swelling in her legs, thighs, and abdomen that she attributes to her poor her eating habits. She also describes some shortness of breath that is most notable when she is lying flat and when she tries to move around in bed. She reports that it has limited her walking to about 6-7 steps, but after she stops she is able to recover.  Regarding her atrial fibrillation, she states that she has had paroxysmal AF for many years and in the past she has been able to tell when she is in it. She check her BP and pulse every morning and this morning her HR was in the 80s. She was surprised when she was told that she was in atrial fibrillation in the ER. She was previously on warfarin and apixaban for treatment of both a prior history of PE and for her AF. She had issues with the apixaban due to cost and found that anticoagulation led to rectal bleeding and nuisance bleeding in the form of skin  bruising etc.     In the emergency department, the patient was not in extremis and seemed mostly uncomfortable due to back pain. She was not overtly symptomatic from her and was breathing comfortably on room air. EKG and telemetry showed AF RVR. CTPE showed a large right-sided PE with mild right heart strain, as well as bilateral pleural effusions. BNP was elevated at 580.5 and hsTnT 24->24.   Past Medical History:  Diagnosis Date   Back pain    Gout    Murmur    PE (pulmonary embolism)    Persistent atrial fibrillation (HCC)    Pulmonary emboli (HCC) 02/05/2020   Syncope    Thyroid disease     No past surgical history on file.   Medications Prior to Admission: Prior to Admission medications   Medication Sig Start Date End Date Taking? Authorizing Provider  amoxicillin-clavulanate (AUGMENTIN) 875-125 MG tablet Take 1 tablet by mouth 2 (two) times daily. Branded Augmentin only per patient pref. 04/25/22  Yes Brunetta Jeans, PA-C  Ascorbic Acid (VITAMIN C) 1000 MG tablet Take 4,000 mg by mouth daily.   Yes [provider]  b complex vitamins capsule Take 1 capsule by mouth daily.   Yes [provider]  hydrogen peroxide (PEROXYL) 1.5 % SOLN Gargle, swish and spit daily Patient taking differently: 1 Capful See admin instructions. Gargle, swish and spit 1 capful once daily.  04/24/22  Yes Brunetta Jeans, PA-C  levothyroxine (SYNTHROID) 50 MCG tablet Take 1 tablet (50 mcg) by mouth on Monday, Wednesday, and Friday Patient taking differently: Take 50 mcg by mouth See admin instructions. 50 mcg once daily on Monday, Wednesday, Friday. 06/28/21  Yes Early, Coralee Pesa, NP  levothyroxine (SYNTHROID) 75 MCG tablet Take 1 tablet (75 mcg) by mouth on Tuesday, Thursday, Saturday, and Sunday Patient taking differently: Take 75 mcg by mouth See admin instructions. 75 mcg once daily on Sunday, Tuesday, Thursday, Saturday. 06/28/21  Yes Early, Coralee Pesa, NP  OVER THE COUNTER MEDICATION Take  1 tablet by mouth daily with supper. Calcium + D3 + K2   Yes [provider]  vitamin E 180 MG (400 UNITS) capsule Take 400 Units by mouth daily.   Yes [provider]  clopidogrel (PLAVIX) 75 MG tablet Take 1 tablet (75 mg total) by mouth daily. Take along with aspirin for 3 weeks, following which stop plavix and continue Aspirin Patient not taking: Reported on 02/10/2022 01/01/22   Jonetta Osgood, MD  ezetimibe (ZETIA) 10 MG tablet Take 1 tablet (10 mg total) by mouth daily. Patient not taking: Reported on 04/29/2022 12/31/21 12/31/22  Jonetta Osgood, MD  metoprolol succinate (TOPROL-XL) 25 MG 24 hr tablet Take 25 mg by mouth daily. Pt reports she doesn't take it daily Patient not taking: Reported on 04/29/2022    [provider]     Allergies:    Allergies  Allergen Reactions   Codeine Nausea Only   Duragesic-100 [Fentanyl] Nausea And Vomiting    Dizziness Lightheadedness    Mucinex [Guaifenesin Er] Other (See Comments)    Syncope    Nsaids Other (See Comments)    Kidney disease    Social History:   Social History   Socioeconomic History   Marital status: Divorced    Spouse name: Not on file   Number of children: Not on file   Years of education: Not on file   Highest education level: Not on file  Occupational History    Comment: retired Therapist, sports  Tobacco Use   Smoking status: Never   Smokeless tobacco: Never  Vaping Use   Vaping Use: Never used  Substance and Sexual Activity   Alcohol use: No   Drug use: Never   Sexual activity: Not on file  Other Topics Concern   Not on file  Social History Narrative   Lives with her dog, she has no family members. 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   Social Determinants of Health   Financial Resource Strain: High Risk (06/14/2021)   Overall Financial Resource Strain (CARDIA)    Difficulty of Paying Living Expenses: Hard  Food Insecurity: No Food Insecurity (03/15/2020)   Hunger  Vital Sign    Worried About Running Out of Food in the Last Year: Never true    Ran Out of Food in the Last Year: Never true  Transportation Needs: No Transportation Needs (06/14/2021)   PRAPARE - Hydrologist (Medical): No    Lack of Transportation (Non-Medical): No  Physical Activity: Not on file  Stress: Stress Concern Present (06/14/2021)   [place]    Feeling of Stress : Very much  Social Connections: Not on file  Intimate Partner Violence: Not on file    Family History:   The patient's Family history is unknown by patient.  ROS:  Please see the history of present illness.  All other ROS reviewed and negative.     Physical Exam/Data:   Vitals:   04/29/22 1430 04/29/22 1500 04/29/22 1515 04/29/22 1530  BP:  112/68 131/81 123/88  Pulse: (!) 111 (!) 113 (!) 103 (!) 114  Resp: (!) 22 (!) 27 19 (!) 21  Temp:    98 F (36.7 C)  TempSrc:    Oral  SpO2: 95% 96% 98% 97%   No intake or output data in the 24 hours ending 04/29/22 1857    02/10/2022    1:25 PM 12/30/2021    1:17 PM 06/28/2021    3:59 PM  Last 3 Weights  Weight (lbs) 174 lb 9.6 oz 178 lb 9.2 oz 196 lb  Weight (kg) 79.198 kg 81 kg 88.905 kg     There is no height or weight on file to calculate BMI.  General:  Well nourished,  in no acute distress HEENT: normal Neck: mild JVD just above base of neck Cardiac:  normal S1, S2; tachy, irregularly irregular; no murmur/rubs/gallops Lungs:  bibasilar rales, no rhonchi or wheezing Abd: soft, nontender, obese Ext: 2 + pitting edema to the thighs Skin: warm and dry  Neuro:  No focal abnormalities noted Psych:  Normal affect    EKG:  The ECG that was done 04/29/22 was personally reviewed and demonstrates AF with RVR, no dynamic ST changes  Relevant CV Studies: TTE - 12/31/21    1. Left ventricular ejection fraction, by estimation, is 55 to 60%. The left ventricle has  normal function. The left ventricle has no regional wall motion abnormalities. There is mild left ventricular hypertrophy. Left ventricular diastolic parameters are indeterminate.  2. Right ventricular systolic function is normal. The right ventricular size is normal. There is normal pulmonary artery systolic pressure.  3. Left atrial size was moderately dilated.  4. Right atrial size was moderately dilated.  5. Severe bulky posterior annular calcification mean diastolic gradient 5 mmHg but MVA estimated at > 2 cm2 . The mitral valve is degenerative. Moderate mitral valve regurgitation. No evidence of mitral stenosis. Severe mitral annular calcification.  6. The aortic valve is tricuspid. There is moderate calcification of the aortic valve. There is moderate thickening of the aortic valve. Aortic valve regurgitation is mild. Mild aortic valve stenosis.  7. Aortic dilatation noted. There is moderate dilatation of the ascending aorta, measuring 42 mm.  8. The inferior vena cava is dilated in size with <50% respiratory variability, suggesting right atrial pressure of 15 mmHg.    Laboratory Data:  High Sensitivity Troponin:   Recent Labs  Lab 04/29/22 0639 04/29/22 0849  TROPONINIHS 24* 24*      Chemistry Recent Labs  Lab 04/29/22 0639  NA 142  K 4.4  CL 105  CO2 26  GLUCOSE 96  BUN 46*  CREATININE 1.37*  CALCIUM 9.1  GFRNONAA 42*  ANIONGAP 11    Recent Labs  Lab 04/29/22 0639  PROT 6.8  ALBUMIN 3.0*  AST 24  ALT 23  ALKPHOS 66  BILITOT 0.7   Lipids No results for input(s): "CHOL", "TRIG", "HDL", "LABVLDL", "LDLCALC", "CHOLHDL" in the last 168 hours. Hematology Recent Labs  Lab 04/29/22 0639  WBC 6.1  RBC 3.85*  HGB 10.7*  HCT 34.9*  MCV 90.6  MCH 27.8  MCHC 30.7  RDW 14.9  PLT 257   Thyroid No results for input(s): "TSH", "FREET4" in the last 168 hours. BNP Recent Labs  Lab 04/29/22 0639  BNP 580.5*    DDimer No results for input(s): "DDIMER"  in the last 168 hours.   Radiology/Studies:  CT Angio Chest PE W/Cm &/Or Wo Cm  Result Date: 04/29/2022 CLINICAL DATA:  Suspected pulmonary embolism. EXAM: CT ANGIOGRAPHY CHEST WITH CONTRAST TECHNIQUE: Multidetector CT imaging of the chest was performed using the standard protocol during bolus administration of intravenous contrast. Multiplanar CT image reconstructions and MIPs were obtained to evaluate the vascular anatomy. RADIATION DOSE REDUCTION: This exam was performed according to the departmental dose-optimization program which includes automated exposure control, adjustment of the mA and/or kV according to patient size and/or use of iterative reconstruction technique. CONTRAST:  2m OMNIPAQUE IOHEXOL 350 MG/ML SOLN COMPARISON:  February 27, 2020 FINDINGS: Cardiovascular: There is marked severity calcification of the thoracic aorta. The ascending thoracic aorta measures 4.3 cm in diameter. Marked severity intraluminal low attenuation is seen within the mid and distal portions of the right pulmonary artery involvement of proximal right middle lobe and right lower lobe branches is seen. There is mild cardiomegaly with marked severity coronary artery calcification. Mild right heart strain is seen (RV/LV ratio of 1.14). No pericardial effusion. Mediastinum/Nodes: No enlarged mediastinal, hilar, or axillary lymph nodes. Thyroid gland, trachea, and esophagus demonstrate no significant findings. Lungs/Pleura: Mild left basilar and moderate severity right basilar atelectasis is seen. Small to moderate size bilateral pleural effusions are noted, right slightly greater than left. No pneumothorax is identified. Upper Abdomen: Multiple surgical clips are seen within the gallbladder fossa. Noninflamed diverticula are seen within the visualized portion of the splenic flexure. Musculoskeletal: Multilevel degenerative changes are seen throughout the thoracic spine. Review of the MIP images confirms the above findings.  IMPRESSION: 1. Marked severity right-sided pulmonary embolism with mild right heart strain (RV/LV ratio of 1.14). 2. Mild left basilar and moderate severity right basilar atelectasis. 3. Small to moderate size bilateral pleural effusions, right slightly greater than left. 4. Colonic diverticulosis. 5. Evidence of prior cholecystectomy. 6. Aortic atherosclerosis. Aortic Atherosclerosis (ICD10-I70.0). Electronically Signed   By: TVirgina NorfolkM.D.   On: 04/29/2022 17:43   DG Chest 2 View  Result Date: 04/29/2022 CLINICAL DATA:  Fatigue EXAM: CHEST - 2 VIEW COMPARISON:  03/30/2022 FINDINGS: Small bilateral pleural effusion. No visible edema or air bronchogram. Bulky mitral annular calcification. Normal heart size and stable mediastinal contours. IMPRESSION: Small bilateral pleural effusion. Electronically Signed   By: JJorje GuildM.D.   On: 04/29/2022 07:34   CT HEAD WO CONTRAST (5MM)  Result Date: 04/29/2022 CLINICAL DATA:  Neuro deficit with acute stroke suspected EXAM: CT HEAD WITHOUT CONTRAST TECHNIQUE: Contiguous axial images were obtained from the base of the skull through the vertex without intravenous contrast. RADIATION DOSE REDUCTION: This exam was performed according to the departmental dose-optimization program which includes automated exposure control, adjustment of the mA and/or kV according to patient size and/or use of iterative reconstruction technique. COMPARISON:  Brain MRI 12/30/2021 FINDINGS: Brain: No evidence of acute infarction, hemorrhage, hydrocephalus, or mass lesion/mass effect. Chronic small vessel infarcts in the bilateral cerebellum, bilateral caudate, and left corona radiata. Subdural collections on prior not confidently seen today. Vascular: No hyperdense vessel or unexpected calcification. Skull: Normal. Negative for fracture or focal lesion. Sinuses/Orbits: No acute finding. IMPRESSION: 1. No acute finding. 2. Chronic infra and supratentorial small vessel infarcts.  Electronically Signed   By: JJorje GuildM.D.   On: 04/29/2022 07:14     Assessment and Plan:   Cynthia Cook a  67 y.o. female with a history of paroxymal AF (CHADSVASC 4, not on anticoagulation), thyroid disease, CKD, a history of TIA and subdural hematoma on imaging, chronic back pain and a remote history of PE who presented to the ED on 04/29/2022 for the evaluation of shortness of breath and was found to be in AF with RVR with a large right sided PE on CT imaging.   # PE # Elevated troponin/BNP :: Submassive/intermediate risk based on normotension, elevated BNP and troponin, and mild RV strain on CT. No indication for lytics, especially when taking into account her history of subdural hematomas on MRI in late 2023. I believe the elevated biomarkers are in the setting of her PE. - TTE in AM - Monitor vitals closely   # AF with RVR :: Rate control strategy given unclear how long patient has been in AF, as she is asymptomatic. She states that she had a HR in the 80s just this morning, but she could possibly still been in rate controlled AF then. Given her history of stroke, I am weary of starting antiarrhythmics without clearing the left atrial appendage.  - Heparin gtt as above - Fractionated metoprolol. Will start 12.'5mg'$  Q6 hr and uptitrate as tolerated hemodynamically   # Volume Overload # HFpEF - Received '40mg'$  IV lasix in the ED; would be careful about additional diuresis in the acute setting given her submassive PE and mild right heart strain - Monitor strict intake and output - Repeat TTE as above       Signed, Clois Dupes, MD  04/29/2022 6:57 PM
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
## 2022-04-29 NOTE — Progress Notes (Signed)
ANTICOAGULATION CONSULT NOTE - Initial Consult  Pharmacy Consult for Heparin Indication: atrial fibrillation and pulmonary embolus  Allergies  Allergen Reactions   Codeine Nausea Only   Duragesic-100 [Fentanyl] Nausea And Vomiting    Dizziness Lightheadedness    Mucinex [Guaifenesin Er] Other (See Comments)    Syncope    Nsaids Other (See Comments)    Kidney disease    Patient Measurements:   Heparin Dosing Weight: 75 kg  Vital Signs: Temp: 98 F (36.7 C) (01/20 1530) Temp Source: Oral (01/20 1530) BP: 123/88 (01/20 1530) Pulse Rate: 114 (01/20 1530)  Labs: Recent Labs    04/29/22 0639 04/29/22 0849  HGB 10.7*  --   HCT 34.9*  --   PLT 257  --   CREATININE 1.37*  --   TROPONINIHS 24* 24*    CrCl cannot be calculated (Unknown ideal weight.).   Medical History: Past Medical History:  Diagnosis Date   Back pain    Gout    Murmur    PE (pulmonary embolism)    Persistent atrial fibrillation (HCC)    Pulmonary emboli ([facility]) 02/05/2020   Syncope    Thyroid disease     Medications:  (Not in a hospital admission)  Scheduled:   enoxaparin (LOVENOX) injection  40 mg Subcutaneous Q24H   [START ON 04/30/2022] furosemide  40 mg Intravenous BID   lidocaine  1 patch Transdermal Q24H   sodium chloride flush  3 mL Intravenous Q12H   traMADol  50 mg Oral Q6H   Infusions:  PRN: acetaminophen **OR** acetaminophen, albuterol, morphine injection, ondansetron **OR** ondansetron (ZOFRAN) IV  Assessment: 26 yof with a history of AF not on anticoagulation, hx of PE, hx of CVA, recent subdural hematoma, hypothyroidism, and chronic back pain. Patient is presenting with decreased sensation of lower legs. Heparin per pharmacy consult placed for atrial fibrillation and pulmonary embolus.  CTA PE with marked severity rt-sided PE w/ mild heart strain  Patient is not on anticoagulation prior to arrival.  Hgb 10.7; plt 257  Goal of Therapy:  Heparin level 0.3-0.7  units/ml Monitor platelets by anticoagulation protocol: Yes   Plan:  No initial bolus per attending MD given concern for hemorrhage risk and known "possibly chronic" subdural hematomas Cautiosly starting heparin infusion at 1250 units/hr Check anti-Xa level at 0200 and daily while on heparin Continue to monitor H&H and platelets  Lorelei Pont, PharmD, BCPS 04/29/2022 6:05 PM ED Clinical Pharmacist -  [phone]

## 2022-04-29 NOTE — ED Triage Notes (Signed)
Patient arrived with EMS from home reports " decreased sensation " at bilateral lower legs and feet onset last month after a fall . Uses a cane to ambulate .

## 2022-04-29 NOTE — ED Provider Notes (Signed)
[facility] Provider Note   CSN: [id number] Arrival date & time: 04/29/22  0535     History  Chief Complaint  Patient presents with   Lower legs decreased sensation     Cynthia Cook is a 67 y.o. female.  67 year old female presents from home with leg swelling, SHOB, difficulty caring for self at home. Patient had a fall on 03/23/22 resulting in an L2 compression fracture. Followed up with ortho and had an MRI. Then on 03/30/22 returned to the ER with [facility] and thick mucous cough, positive for RSV and PNA. Notes swelling in legs now extending to her abdomen, short of breath at rest and worse with exertion. No CP. History of a fib, has had prior PE, not anticoagulated (takes a natural or homeopathic medication for this). States she has struggled to take care of herself. Is able to walk with her cane and holding onto the walls, has been eating meals dropped off by her church which have more sodium than she typically eats.        Home Medications Prior to Admission medications   Medication Sig Start Date End Date Taking? Authorizing Provider  amoxicillin-clavulanate (AUGMENTIN) 875-125 MG tablet Take 1 tablet by mouth 2 (two) times daily. Branded Augmentin only per patient pref. 04/25/22   Cynthia Jeans, PA-C  ascorbic acid (VITAMIN C) 500 MG tablet Take 500 mg by mouth daily.    [provider]  aspirin 81 MG chewable tablet Chew 81 mg by mouth daily. Patient not taking: Reported on 02/10/2022 12/25/21   [provider]  clopidogrel (PLAVIX) 75 MG tablet Take 1 tablet (75 mg total) by mouth daily. Take along with aspirin for 3 weeks, following which stop plavix and continue Aspirin Patient not taking: Reported on 02/10/2022 01/01/22   Cynthia Osgood, MD  ezetimibe (ZETIA) 10 MG tablet Take 1 tablet (10 mg total) by mouth daily. 12/31/21 12/31/22  Cynthia Osgood, MD  hydrogen peroxide (PEROXYL) 1.5 % SOLN Gargle, swish  and spit daily 04/24/22   Cynthia Jeans, PA-C  levothyroxine (SYNTHROID) 50 MCG tablet Take 1 tablet (50 mcg) by mouth on Monday, Wednesday, and Friday 06/28/21   Early, Coralee Pesa, NP  levothyroxine (SYNTHROID) 75 MCG tablet Take 1 tablet (75 mcg) by mouth on Tuesday, Thursday, Saturday, and Sunday 06/28/21   Early, Coralee Pesa, NP  metoprolol succinate (TOPROL-XL) 25 MG 24 hr tablet Take 25 mg by mouth daily. Pt reports she doesn't take it daily    [provider]  triamcinolone cream (KENALOG) 0.1 % Apply 1 application. topically daily as needed (rash). 06/28/21   Cynthia Render, NP  Vitamin D-Vitamin K (VITAMIN K2-VITAMIN D3 PO) Take 1 tablet by mouth daily.     [provider]  vitamin E 180 MG (400 UNITS) capsule Take 400 Units by mouth daily.    [provider]      Allergies    Codeine and Fentanyl    Review of Systems   Review of Systems Negative except as per HPI Physical Exam Updated Vital Signs BP 112/68   Pulse (!) 113   Temp 98 F (36.7 C) (Oral)   Resp (!) 27   SpO2 96%  Physical Exam Vitals and nursing note reviewed.  Constitutional:      General: She is not in acute distress.    Appearance: She is well-developed. She is not diaphoretic.  HENT:     Head:  Normocephalic and atraumatic.     Mouth/Throat:     Mouth: Mucous membranes are moist.  Cardiovascular:     Rate and Rhythm: Regular rhythm. Tachycardia present.     Pulses: Normal pulses.     Heart sounds: Normal heart sounds.  Pulmonary:     Effort: Pulmonary effort is normal.     Breath sounds: Examination of the left-middle field reveals decreased breath sounds. Examination of the right-lower field reveals decreased breath sounds. Examination of the left-lower field reveals decreased breath sounds. Decreased breath sounds present.  Abdominal:     Palpations: Abdomen is soft.     Tenderness: There is no abdominal tenderness.  Musculoskeletal:     Cervical back: Neck supple.     Right  lower leg: Edema present.     Left lower leg: Edema present.  Skin:    General: Skin is warm and dry.     Findings: No erythema or rash.  Neurological:     Mental Status: She is alert and oriented to person, place, and time.     Sensory: No sensory deficit.     Motor: No weakness.  Psychiatric:        Behavior: Behavior normal.     ED Results / Procedures / Treatments   Labs (all labs ordered are listed, but only abnormal results are displayed) Labs Reviewed  COMPREHENSIVE METABOLIC PANEL - Abnormal; Notable for the following components:      Result Value   BUN 46 (*)    Creatinine, Ser 1.37 (*)    Albumin 3.0 (*)    GFR, Estimated 42 (*)    All other components within normal limits  CBC - Abnormal; Notable for the following components:   RBC 3.85 (*)    Hemoglobin 10.7 (*)    HCT 34.9 (*)    All other components within normal limits  BRAIN NATRIURETIC PEPTIDE - Abnormal; Notable for the following components:   B Natriuretic Peptide 580.5 (*)    All other components within normal limits  TROPONIN I (HIGH SENSITIVITY) - Abnormal; Notable for the following components:   Troponin I (High Sensitivity) 24 (*)    All other components within normal limits  TROPONIN I (HIGH SENSITIVITY) - Abnormal; Notable for the following components:   Troponin I (High Sensitivity) 24 (*)    All other components within normal limits  CULTURE, BLOOD (SINGLE)  LACTIC ACID, PLASMA  URINALYSIS, ROUTINE W REFLEX MICROSCOPIC    EKG EKG Interpretation  Date/Time:  Saturday April 29 2022 06:55:42 EST Ventricular Rate:  117 PR Interval:    QRS Duration: 96 QT Interval:  282 QTC Calculation: 393 R Axis:   38 Text Interpretation: Atrial fibrillation with rapid ventricular response Possible Anterior infarct , age undetermined Abnormal ECG When compared with ECG of 30-Mar-2022 10:39, PREVIOUS ECG IS PRESENT Confirmed by Octaviano Glow [id number]) on 04/29/2022 11:08:19 AM  Radiology DG Chest 2  View  Result Date: 04/29/2022 CLINICAL DATA:  Fatigue EXAM: CHEST - 2 VIEW COMPARISON:  03/30/2022 FINDINGS: Small bilateral pleural effusion. No visible edema or air bronchogram. Bulky mitral annular calcification. Normal heart size and stable mediastinal contours. IMPRESSION: Small bilateral pleural effusion. Electronically Signed   By: Jorje Guild M.D.   On: 04/29/2022 07:34   CT HEAD WO CONTRAST (5MM)  Result Date: 04/29/2022 CLINICAL DATA:  Neuro deficit with acute stroke suspected EXAM: CT HEAD WITHOUT CONTRAST TECHNIQUE: Contiguous axial images were obtained from the base of the skull through the vertex without  intravenous contrast. RADIATION DOSE REDUCTION: This exam was performed according to the departmental dose-optimization program which includes automated exposure control, adjustment of the mA and/or kV according to patient size and/or use of iterative reconstruction technique. COMPARISON:  Brain MRI 12/30/2021 FINDINGS: Brain: No evidence of acute infarction, hemorrhage, hydrocephalus, or mass lesion/mass effect. Chronic small vessel infarcts in the bilateral cerebellum, bilateral caudate, and left corona radiata. Subdural collections on prior not confidently seen today. Vascular: No hyperdense vessel or unexpected calcification. Skull: Normal. Negative for fracture or focal lesion. Sinuses/Orbits: No acute finding. IMPRESSION: 1. No acute finding. 2. Chronic infra and supratentorial small vessel infarcts. Electronically Signed   By: Jorje Guild M.D.   On: 04/29/2022 07:14    Procedures Procedures    Medications Ordered in ED Medications  furosemide (LASIX) injection 40 mg (has no administration in time range)  traMADol (ULTRAM) tablet 50 mg (50 mg Oral Given 04/29/22 1156)    ED Course/ Medical Decision Making/ A&P                             Medical Decision Making Amount and/or Complexity of Data Reviewed Radiology: ordered.  Risk Prescription drug  management.     This patient presents to the ED for concern of shortness of breath, edema, failure to thrive, this involves an extensive number of treatment options, and is a complaint that carries with it a high risk of complications and morbidity.  The differential diagnosis includes but not limited to CHF, PE, arrhythmia, dehydration   Co morbidities that complicate the patient evaluation  A-fib, CVA, CKD, PE (not currently anticoagulated), history of subdural hematoma.   Additional history obtained:  External records from outside source obtained and reviewed including prior labs on file for comparison.  Recent records for ER visits.  Higher chest x-ray for comparison.   Lab Tests:  I Ordered, and personally interpreted labs.  The pertinent results include: Elevated BNP at 580.  CBC with anemia, hemoglobin 10.7, similar to trend on the prior 12.  CMP with creatinine 1.37 at baseline however elevated BUN at 46.  GFR is 42.  Troponins are unchanged at 24 and 24.  Lactic acid 0.9.   Imaging Studies ordered:  I ordered imaging studies including CXR  I independently visualized and interpreted imaging which showed L>R pleural effusions  I agree with the radiologist interpretation   Cardiac Monitoring: / EKG:  The patient was maintained on a cardiac monitor.  I personally viewed and interpreted the cardiac monitored which showed an underlying rhythm of: A-fib, rate 117   Consultations Obtained:  I requested consultation with the ER attending, Dr. Langston Masker,  and discussed lab and imaging findings as well as pertinent plan - they recommend: consider diltiazem for rate control after PE study complete and consultation with admitting team for preference of drip vs bolus   Problem List / ED Course / Critical interventions / Medication management  67 year old female presents from home with concern for [facility], edema to her abdomen, failure to thrive/care for self at home, as above in HPI.  Found to have diminished lung sounds, CXR with bilateral effusions (L>R), pitting edema to abdomen, afib with rate 105-115. CT head without acute findings.  Elevated BNP without history of CHF. Elevated BUN, Cr at baseline. Trop unchanged.  Plan is to obtain CT to evaluate for PE. Consider diltiazem bolus vs drip for rate control- pending PE study and discussion with  admitting team.  I ordered medication including tramadol for pain Reevaluation of the patient after these medicines showed that the patient stayed the same I have reviewed the patients home medicines and have made adjustments as needed   Social Determinants of Health:  Lives alone   Test / Admission - Considered:  Plan is to admit         Final Clinical Impression(s) / ED Diagnoses Final diagnoses:  Acute congestive heart failure, unspecified heart failure type [facility])  Atrial fibrillation, unspecified type [facility])    Rx / DC Orders ED Discharge Orders     None         Tacy Learn, PA-C 04/29/22 1524    Wyvonnia Dusky, MD 04/29/22 1531

## 2022-04-30 ENCOUNTER — Inpatient Hospital Stay (HOSPITAL_COMMUNITY): Payer: Medicare HMO

## 2022-04-30 DIAGNOSIS — I2699 Other pulmonary embolism without acute cor pulmonale: Secondary | ICD-10-CM

## 2022-04-30 DIAGNOSIS — I2609 Other pulmonary embolism with acute cor pulmonale: Secondary | ICD-10-CM

## 2022-04-30 DIAGNOSIS — I480 Paroxysmal atrial fibrillation: Secondary | ICD-10-CM | POA: Diagnosis not present

## 2022-04-30 LAB — BASIC METABOLIC PANEL
Anion gap: 10 (ref 5–15)
Anion gap: 11 (ref 5–15)
BUN: 44 mg/dL — ABNORMAL HIGH (ref 8–23)
BUN: 45 mg/dL — ABNORMAL HIGH (ref 8–23)
CO2: 25 mmol/L (ref 22–32)
CO2: 26 mmol/L (ref 22–32)
Calcium: 9 mg/dL (ref 8.9–10.3)
Calcium: 8.8 mg/dL — ABNORMAL LOW (ref 8.9–10.3)
Chloride: 104 mmol/L (ref 98–111)
Chloride: 103 mmol/L (ref 98–111)
Creatinine, Ser: 1.48 mg/dL — ABNORMAL HIGH (ref 0.44–1.00)
Creatinine, Ser: 1.61 mg/dL — ABNORMAL HIGH (ref 0.44–1.00)
GFR, Estimated: 38 mL/min — ABNORMAL LOW (ref >60)
GFR, Estimated: 34 mL/min — ABNORMAL LOW (ref >60)
Glucose, Bld: 121 mg/dL — ABNORMAL HIGH (ref 70–99)
Glucose, Bld: 130 mg/dL — ABNORMAL HIGH (ref 70–99)
Potassium: 5.2 mmol/L — ABNORMAL HIGH (ref 3.5–5.1)
Potassium: 4.6 mmol/L (ref 3.5–5.1)
Sodium: 139 mmol/L (ref 135–145)
Sodium: 140 mmol/L (ref 135–145)

## 2022-04-30 LAB — CBC
HCT: 35.1 % — ABNORMAL LOW (ref 36.0–46.0)
Hemoglobin: 11.4 g/dL — ABNORMAL LOW (ref 12.0–15.0)
MCH: 28.3 pg (ref 26.0–34.0)
MCHC: 32.5 g/dL (ref 30.0–36.0)
MCV: 87.1 fL (ref 80.0–100.0)
Platelets: 218 10*3/uL (ref 150–400)
RBC: 4.03 MIL/uL (ref 3.87–5.11)
RDW: 14.8 % (ref 11.5–15.5)
WBC: 9 10*3/uL (ref 4.0–10.5)
nRBC: 0 % (ref 0.0–0.2)

## 2022-04-30 LAB — HEPARIN LEVEL (UNFRACTIONATED)
Heparin Unfractionated: 0.24 IU/mL — ABNORMAL LOW (ref 0.30–0.70)
Heparin Unfractionated: 0.56 IU/mL (ref 0.30–0.70)
Heparin Unfractionated: 0.5 IU/mL (ref 0.30–0.70)

## 2022-04-30 LAB — ECHOCARDIOGRAM COMPLETE
AR max vel: 1.22 cm2
AV Area VTI: 1.24 cm2
AV Area mean vel: 1.28 cm2
AV Mean grad: 10 mmHg
AV Peak grad: 18 mmHg
Ao pk vel: 2.12 m/s
MV M vel: 5.1 m/s
MV Peak grad: 104 mmHg
MV VTI: 1.54 cm2
Radius: 0.5 cm
S' Lateral: 3 cm
Weight: 3012.37 oz

## 2022-04-30 LAB — MAGNESIUM: Magnesium: 2.1 mg/dL (ref 1.7–2.4)

## 2022-04-30 MED ORDER — SENNOSIDES-DOCUSATE SODIUM 8.6-50 MG PO TABS
1.0000 | ORAL_TABLET | Freq: Every day | ORAL | Status: DC
  Administered 2022-04-30 – 2022-05-04 (×5): 1 via ORAL
  Filled 2022-04-30 (×5): qty 1

## 2022-04-30 MED ORDER — TRAMADOL HCL 50 MG PO TABS
25.0000 mg | ORAL_TABLET | Freq: Four times a day (QID) | ORAL | Status: DC
  Administered 2022-05-01 – 2022-05-05 (×18): 25 mg via ORAL
  Filled 2022-04-30 (×19): qty 1

## 2022-04-30 MED ORDER — METOPROLOL TARTRATE 25 MG PO TABS
12.5000 mg | ORAL_TABLET | Freq: Four times a day (QID) | ORAL | Status: DC
  Administered 2022-04-30: 12.5 mg via ORAL
  Filled 2022-04-30 (×2): qty 1

## 2022-04-30 NOTE — Progress Notes (Signed)
Rounding Note    Patient Name: Cynthia Cook Date of Encounter: 04/30/2022  [place] Cardiologist: Buford Dresser, MD   Subjective   Feels better this morning.  She has diuresed.  Less edematous.  No significant shortness of breath.  Heart rate improved.  Inpatient Medications    Scheduled Meds:  furosemide  40 mg Intravenous BID   [START ON 05/01/2022] levothyroxine  50 mcg Oral Q M,W,F   levothyroxine  75 mcg Oral Once per day on Sun Tue Thu Sat   lidocaine  1 patch Transdermal Q24H   metoprolol tartrate  25 mg Oral Q6H   sodium chloride flush  3 mL Intravenous Q12H   traMADol  50 mg Oral Q6H   Continuous Infusions:  heparin 1,400 Units/hr (04/30/22 0303)   PRN Meds: acetaminophen **OR** acetaminophen, albuterol, morphine injection, ondansetron **OR** ondansetron (ZOFRAN) IV   Vital Signs    Vitals:   04/29/22 2254 04/30/22 0408 04/30/22 0817 04/30/22 0835  BP: (!) 133/98 132/81 118/81   Pulse: 89 (!) 113 (!) 122 (!) 106  Resp: '20 18 17 19  '$ Temp: 98.3 F (36.8 C) 97.8 F (36.6 C) 97.8 F (36.6 C)   TempSrc: Oral Oral Oral   SpO2: 95% 94% 96% 93%  Weight:  85.4 kg      Intake/Output Summary (Last 24 hours) at 04/30/2022 1020 Last data filed at 04/30/2022 0444 Gross per 24 hour  Intake 82.66 ml  Output --  Net 82.66 ml      04/30/2022    4:08 AM 02/10/2022    1:25 PM 12/30/2021    1:17 PM  Last 3 Weights  Weight (lbs) 188 lb 4.4 oz 174 lb 9.6 oz 178 lb 9.2 oz  Weight (kg) 85.4 kg 79.198 kg 81 kg      Telemetry    Atrial fibrillation heart rates currently in the 110 range- Personally Reviewed  ECG    AFIB RVR  - Personally Reviewed  Physical Exam   GEN: No acute distress.   Neck: No JVD Cardiac: IRREG IRREG, no murmurs, rubs, or gallops.  Respiratory: Clear to auscultation bilaterally. GI: Soft, nontender, non-distended  MS: 2+ LE edema; No deformity. Neuro:  Nonfocal  Psych: Normal affect   Labs    High Sensitivity  Troponin:   Recent Labs  Lab 04/29/22 0639 04/29/22 0849  TROPONINIHS 24* 24*     Chemistry Recent Labs  Lab 04/29/22 0639 04/30/22 0124  NA 142 139  K 4.4 5.2*  CL 105 104  CO2 26 25  GLUCOSE 96 121*  BUN 46* 44*  CREATININE 1.37* 1.48*  CALCIUM 9.1 9.0  MG  --  2.1  PROT 6.8  --   ALBUMIN 3.0*  --   AST 24  --   ALT 23  --   ALKPHOS 66  --   BILITOT 0.7  --   GFRNONAA 42* 38*  ANIONGAP 11 10    Lipids No results for input(s): "CHOL", "TRIG", "HDL", "LABVLDL", "LDLCALC", "CHOLHDL" in the last 168 hours.  Hematology Recent Labs  Lab 04/29/22 0639 04/30/22 0124  WBC 6.1 9.0  RBC 3.85* 4.03  HGB 10.7* 11.4*  HCT 34.9* 35.1*  MCV 90.6 87.1  MCH 27.8 28.3  MCHC 30.7 32.5  RDW 14.9 14.8  PLT 257 218   Thyroid  Recent Labs  Lab 04/29/22 0849  TSH 4.812*    BNP Recent Labs  Lab 04/29/22 0639  BNP 580.5*    DDimer No results  for input(s): "DDIMER" in the last 168 hours.   Radiology    CT Angio Chest PE W/Cm &/Or Wo Cm  Result Date: 04/29/2022 CLINICAL DATA:  Suspected pulmonary embolism. EXAM: CT ANGIOGRAPHY CHEST WITH CONTRAST TECHNIQUE: Multidetector CT imaging of the chest was performed using the standard protocol during bolus administration of intravenous contrast. Multiplanar CT image reconstructions and MIPs were obtained to evaluate the vascular anatomy. RADIATION DOSE REDUCTION: This exam was performed according to the departmental dose-optimization program which includes automated exposure control, adjustment of the mA and/or kV according to patient size and/or use of iterative reconstruction technique. CONTRAST:  105m OMNIPAQUE IOHEXOL 350 MG/ML SOLN COMPARISON:  February 27, 2020 FINDINGS: Cardiovascular: There is marked severity calcification of the thoracic aorta. The ascending thoracic aorta measures 4.3 cm in diameter. Marked severity intraluminal low attenuation is seen within the mid and distal portions of the right pulmonary artery involvement  of proximal right middle lobe and right lower lobe branches is seen. There is mild cardiomegaly with marked severity coronary artery calcification. Mild right heart strain is seen (RV/LV ratio of 1.14). No pericardial effusion. Mediastinum/Nodes: No enlarged mediastinal, hilar, or axillary lymph nodes. Thyroid gland, trachea, and esophagus demonstrate no significant findings. Lungs/Pleura: Mild left basilar and moderate severity right basilar atelectasis is seen. Small to moderate size bilateral pleural effusions are noted, right slightly greater than left. No pneumothorax is identified. Upper Abdomen: Multiple surgical clips are seen within the gallbladder fossa. Noninflamed diverticula are seen within the visualized portion of the splenic flexure. Musculoskeletal: Multilevel degenerative changes are seen throughout the thoracic spine. Review of the MIP images confirms the above findings. IMPRESSION: 1. Marked severity right-sided pulmonary embolism with mild right heart strain (RV/LV ratio of 1.14). 2. Mild left basilar and moderate severity right basilar atelectasis. 3. Small to moderate size bilateral pleural effusions, right slightly greater than left. 4. Colonic diverticulosis. 5. Evidence of prior cholecystectomy. 6. Aortic atherosclerosis. Aortic Atherosclerosis (ICD10-I70.0). Electronically Signed   By: TVirgina NorfolkM.D.   On: 04/29/2022 17:43   DG Chest 2 View  Result Date: 04/29/2022 CLINICAL DATA:  Fatigue EXAM: CHEST - 2 VIEW COMPARISON:  03/30/2022 FINDINGS: Small bilateral pleural effusion. No visible edema or air bronchogram. Bulky mitral annular calcification. Normal heart size and stable mediastinal contours. IMPRESSION: Small bilateral pleural effusion. Electronically Signed   By: JJorje GuildM.D.   On: 04/29/2022 07:34   CT HEAD WO CONTRAST (5MM)  Result Date: 04/29/2022 CLINICAL DATA:  Neuro deficit with acute stroke suspected EXAM: CT HEAD WITHOUT CONTRAST TECHNIQUE: Contiguous  axial images were obtained from the base of the skull through the vertex without intravenous contrast. RADIATION DOSE REDUCTION: This exam was performed according to the departmental dose-optimization program which includes automated exposure control, adjustment of the mA and/or kV according to patient size and/or use of iterative reconstruction technique. COMPARISON:  Brain MRI 12/30/2021 FINDINGS: Brain: No evidence of acute infarction, hemorrhage, hydrocephalus, or mass lesion/mass effect. Chronic small vessel infarcts in the bilateral cerebellum, bilateral caudate, and left corona radiata. Subdural collections on prior not confidently seen today. Vascular: No hyperdense vessel or unexpected calcification. Skull: Normal. Negative for fracture or focal lesion. Sinuses/Orbits: No acute finding. IMPRESSION: 1. No acute finding. 2. Chronic infra and supratentorial small vessel infarcts. Electronically Signed   By: JJorje GuildM.D.   On: 04/29/2022 07:14    Cardiac Studies   ECHO P  Patient Profile     67y.o. female here for shortness of breath,  atrial fibrillation RVR, large right-sided pulmonary embolism, dilated ascending aorta 43 mm, coronary artery calcification  Assessment & Plan    Atrial fibrillation with rapid ventricular response - Does not seem to be symptomatic.  She does recall that she had heart rates in the 80s yesterday morning, hard to tell.  Prior history of stroke. - Currently on heparin drip - will transition to [place] tomorrow after PE tx with IV hep.  - Metoprolol 12.5 mg every 6 hours to help with rate control. Will continue to encourage use.  She has refused.  Hold for SBP < 90.  -Previous EKG showed heart rates as high as 172 bpm.  Currently heart rates are improved. -She has previously declined anticoagulation (cost may be a factor) -Prior cardioversion 2021 CHA2DS2-VASc Score = 7  The patient's score is based upon: CHF History: 1 HTN History: 1 Diabetes History:  0 Stroke History: 2 Vascular Disease History: 1 Age Score: 1 Gender Score: 1      Paroxysmal Atrial Fibrillation (ICD10:  I48.0) The patient's CHA2DS2-VASc score is 7, indicating a 11.2% annual risk of stroke.        History of stroke - September 2022 with subacute infarct MRI.  Pulmonary embolism - Submassive intermediate risk.  Right-sided.  No indication for lytics.  Has a prior history of subdural hematomas in late 2023. -Echocardiogram pending. -In review of prior notes, prior pulmonary embolism was 15 years ago in the setting of airline travel.  Elevated troponin-24/flat - This is secondary to myocardial injury in the setting of pulmonary embolism.  This is not ACS.  No need for cardiac rehab.  Acute diastolic heart failure - She did receive IV Lasix 40 mg in the emergency department.  Since she has mild right heart strain, be careful as we do not want to decrease her preload too significantly.  Blood pressure seems to be tolerating previous diuresis. - Echocardiogram pending.  She was nauseous previously this morning.  We will attempt again.  Chronic kidney disease stage IIIa - Creatinine 1.48 this morning.   Continue encourage medication use for her pulmonary embolism as well as atrial fibrillation.     For questions or updates, please contact [place] Please consult [email or web address] for contact info under        Signed, Candee Furbish, MD  04/30/2022, 10:20 AM

## 2022-04-30 NOTE — Progress Notes (Signed)
PROGRESS NOTE    Cynthia Cook  FWY:[id number] DOB: [birth year] DOA: 04/29/2022 PCP: Lurline Del, DO     Brief Narrative:   From admission h and p  Cynthia Cook is a 67 y.o. female with medical history significant of paroxysmal atrial fibrillation not on anticoagulation, history of pulmonary embolism not on anticoagulation, history of CVA, subdural hematoma, hypothyroidism, and chronic back pain who presents with complaints of decreased sensation in her lower legs.  She states that she had a fall on December 14 and had been diagnosed with a compression fracture of her lumbar spine.  Ever since that time she has had paresthesias in her feet and unable to walk in a straight line or stand due to having severe pain. Patient had been diagnosed with RSV 03/30/2022.  Since that time she had a progressive decline and has not been doing well or able to care for self.  Patient states that her dog had died and so she is at home alone.  Neighbors have been bringing food to her that is not the healthiest for which she noticed that she had started to have lower extremity swelling over the last several days.  She has a history of atrial fibrillation and previously had been on Eliquis, but had not been able to afford this medication to continue it.  She reports not being able to tolerate Coumadin due to bleeding and has been taking a Nattokinase supplement to thin her blood instead.  She denies having any chest pain or palpitations, but has felt more short of breath.  Patient patient is not on any blood pressure medication and states that normally her blood pressures and heart rates are well-controlled. She had not been able to follow-up with anyone and states she is felt like she is falling through the cracks.   Review of records note that patient had been admitted with dysarthria found to have an acute stroke with what was thought to be chronic bilateral subdural hematomas back in 12/2021 where she was advised to  need to be on anticoagulation and declined at that time for which she was told that she would likely have recurrent strokes could be like threatening and disabling.     Assessment & Plan:   Principal Problem:   CHF (congestive heart failure) (HCC) Active Problems:   Atrial fibrillation (HCC)   Pulmonary embolism (HCC)   History of pulmonary embolism   Elevated troponin I level   Lumbar compression fracture (HCC)   Normocytic anemia   CKD (chronic kidney disease) stage 3, GFR 30-59 ml/min (HCC) - baseline SCr 1.3   Subdural hematoma (HCC)   History of CVA (cerebrovascular accident)   Hypothyroidism  # Pulmonary embolism Acute, severe right-sided PE on CTA, evidence right heart strain. Hemodynamically stable. PVL initial read negative for DVT. Hx of PE in past, not anticoagulated at time of presentation. Risk factors include recent illness (rsv) and relative immobility 2/2 recent fall with lumbar compression fracture - continue heparin, cardiology advising likely start with orals tomorrow - f/u TTE  # HFpEF 2/2 PE. TTE pending. - cardiology following - conginuing lasix  # A-fib with RVR Relatively asymtpomatic. HR improved, now 100s at rest, chads2vasc is 7. - cont metop - anticoagulation as above  # Lumbar compression fracture Patient reports recent fall evaluated at [facility] with mri which showed lumbar compression fracture - pain control - outpt ortho f/u - pt/ot consults]  # CKD 3b Stable - monitor  # Hx CVA - home  plavix on hold; heparin as above - doesn't appear on statin at home, query patient regarding this prior to d/c  # Hypothyroid TSH mildly elevated - cont home levothyroxine, will need outpt repeat of tsh    DVT prophylaxis: iv heparin Code Status: full Family Communication: none @ bedside  Level of care: Telemetry Cardiac Status is: Inpatient Remains inpatient appropriate because: severity of illness    Consultants:   cardiology  Procedures: none  Antimicrobials:  none    Subjective: Low back pain persists, sob when out of bed  Objective: Vitals:   04/29/22 2254 04/30/22 0408 04/30/22 0817 04/30/22 0835  BP: (!) 133/98 132/81 118/81   Pulse: 89 (!) 113 (!) 122 (!) 106  Resp: '20 18 17 19  '$ Temp: 98.3 F (36.8 C) 97.8 F (36.6 C) 97.8 F (36.6 C)   TempSrc: Oral Oral Oral   SpO2: 95% 94% 96% 93%  Weight:  85.4 kg      Intake/Output Summary (Last 24 hours) at 04/30/2022 1237 Last data filed at 04/30/2022 1100 Gross per 24 hour  Intake 82.66 ml  Output 200 ml  Net -117.34 ml   Filed Weights   04/30/22 0408  Weight: 85.4 kg    Examination:  General exam: Appears in mild pain Respiratory system: rales right base Cardiovascular system: S1 & S2 heard, RRR. No JVD, murmurs, rubs, gallops or clicks. Gastrointestinal system: Abdomen is nondistended, soft and nontender. No organomegaly or masses felt. Normal bowel sounds heard. Central nervous system: Alert and oriented. No focal neurological deficits. Extremities: Symmetric 5 x 5 power. Skin: No rashes, lesions or ulcers. Edema to knees Psychiatry: Judgement and insight appear normal. tearful    Data Reviewed: I have personally reviewed following labs and imaging studies  CBC: Recent Labs  Lab 04/29/22 0639 04/30/22 0124  WBC 6.1 9.0  HGB 10.7* 11.4*  HCT 34.9* 35.1*  MCV 90.6 87.1  PLT 257 400   Basic Metabolic Panel: Recent Labs  Lab 04/29/22 0639 04/30/22 0124  NA 142 139  K 4.4 5.2*  CL 105 104  CO2 26 25  GLUCOSE 96 121*  BUN 46* 44*  CREATININE 1.37* 1.48*  CALCIUM 9.1 9.0  MG  --  2.1   GFR: Estimated Creatinine Clearance: 39.5 mL/min (A) (by C-G formula based on SCr of 1.48 mg/dL (H)). Liver Function Tests: Recent Labs  Lab 04/29/22 0639  AST 24  ALT 23  ALKPHOS 66  BILITOT 0.7  PROT 6.8  ALBUMIN 3.0*   No results for input(s): "LIPASE", "AMYLASE" in the last 168 hours. No results for  input(s): "AMMONIA" in the last 168 hours. Coagulation Profile: No results for input(s): "INR", "PROTIME" in the last 168 hours. Cardiac Enzymes: No results for input(s): "CKTOTAL", "CKMB", "CKMBINDEX", "TROPONINI" in the last 168 hours. BNP (last 3 results) No results for input(s): "PROBNP" in the last 8760 hours. HbA1C: No results for input(s): "HGBA1C" in the last 72 hours. CBG: No results for input(s): "GLUCAP" in the last 168 hours. Lipid Profile: No results for input(s): "CHOL", "HDL", "LDLCALC", "TRIG", "CHOLHDL", "LDLDIRECT" in the last 72 hours. Thyroid Function Tests: Recent Labs    04/29/22 0849  TSH 4.812*   Anemia Panel: No results for input(s): "VITAMINB12", "FOLATE", "FERRITIN", "TIBC", "IRON", "RETICCTPCT" in the last 72 hours. Urine analysis:    Component Value Date/Time   COLORURINE YELLOW 04/29/2022 1949   APPEARANCEUR HAZY (A) 04/29/2022 1949   LABSPEC 1.015 04/29/2022 1949   PHURINE 5.0 04/29/2022 1949  GLUCOSEU NEGATIVE 04/29/2022 1949   HGBUR NEGATIVE 04/29/2022 1949   BILIRUBINUR NEGATIVE 04/29/2022 1949   [place] NEGATIVE 04/29/2022 1949   PROTEINUR NEGATIVE 04/29/2022 1949   NITRITE NEGATIVE 04/29/2022 1949   LEUKOCYTESUR SMALL (A) 04/29/2022 1949   Sepsis Labs: '@LABRCNTIP'$ (procalcitonin:4,lacticidven:4)  ) Recent Results (from the past 240 hour(s))  Culture, blood (single)     Status: None (Preliminary result)   Collection Time: 04/29/22  6:40 AM   Specimen: BLOOD LEFT ARM  Result Value Ref Range Status   Specimen Description BLOOD LEFT ARM  Final   Special Requests   Final    BOTTLES DRAWN AEROBIC AND ANAEROBIC Blood Culture adequate volume   Culture   Final    NO GROWTH 1 DAY Performed at [facility], [place] [address] [place], [place] [address]    Report Status PENDING  Incomplete         Radiology Studies: VAS [place] LOWER EXTREMITY VENOUS (DVT)  Result Date: 04/30/2022  Lower Venous DVT Study Patient Name:  COREN CROWNOVER  Date of Exam:   04/30/2022 Medical Rec #: [id number]       Accession #:    [id number] Date of Birth: Jan 17, 1956        Patient Gender: F Patient Age:   53 years Exam Location:  [facility] Procedure:      VAS [place] LOWER EXTREMITY VENOUS (DVT) Referring Phys: [place] Plan --------------------------------------------------------------------------------  Indications: Pulmonary embolism.  Comparison Study: no prior Performing Technologist: Archie Patten RVS  Examination Guidelines: A complete evaluation includes B-mode imaging, spectral Doppler, color Doppler, and power Doppler as needed of all accessible portions of each vessel. Bilateral testing is considered an integral part of a complete examination. Limited examinations for reoccurring indications may be performed as noted. The reflux portion of the exam is performed with the patient in reverse Trendelenburg.  +--------+---------------+---------+-----------+----------+--------------------+ RIGHT   CompressibilityPhasicitySpontaneityPropertiesThrombus Aging       +--------+---------------+---------+-----------+----------+--------------------+ CFV     Full           Yes      Yes                                       +--------+---------------+---------+-----------+----------+--------------------+ SFJ     Full                                                              +--------+---------------+---------+-----------+----------+--------------------+ FV Prox Full                                                              +--------+---------------+---------+-----------+----------+--------------------+ FV Mid  Full                                                              +--------+---------------+---------+-----------+----------+--------------------+ FV  Full                                                              Distal                                                                     +--------+---------------+---------+-----------+----------+--------------------+ PFV     Full                                                              +--------+---------------+---------+-----------+----------+--------------------+ POP     Full           Yes      Yes                                       +--------+---------------+---------+-----------+----------+--------------------+ PTV     Full                                                              +--------+---------------+---------+-----------+----------+--------------------+ PERO                   Yes      Yes                  patent by color                                                           doppler              +--------+---------------+---------+-----------+----------+--------------------+   +---------+---------------+---------+-----------+----------+-------------------+ LEFT     CompressibilityPhasicitySpontaneityPropertiesThrombus Aging      +---------+---------------+---------+-----------+----------+-------------------+ CFV      Full           Yes                                               +---------+---------------+---------+-----------+----------+-------------------+ SFJ      Full                                                             +---------+---------------+---------+-----------+----------+-------------------+ FV Prox  Full                                                             +---------+---------------+---------+-----------+----------+-------------------+ FV Mid   Full                                                             +---------+---------------+---------+-----------+----------+-------------------+ FV DistalFull                                                             +---------+---------------+---------+-----------+----------+-------------------+ PFV      Full                                                              +---------+---------------+---------+-----------+----------+-------------------+ POP      Full           Yes      Yes                                      +---------+---------------+---------+-----------+----------+-------------------+ PTV      Full                                                             +---------+---------------+---------+-----------+----------+-------------------+ PERO                                                  Not well visualized +---------+---------------+---------+-----------+----------+-------------------+     Summary: RIGHT: - There is no evidence of deep vein thrombosis in the lower extremity.  - No cystic structure found in the popliteal fossa.  LEFT: - There is no evidence of deep vein thrombosis in the lower extremity.  - A cystic structure is found in the popliteal fossa.  *See table(s) above for measurements and observations.    Preliminary    CT Angio Chest PE W/Cm &/Or Wo Cm  Result Date: 04/29/2022 CLINICAL DATA:  Suspected pulmonary embolism. EXAM: CT ANGIOGRAPHY CHEST WITH CONTRAST TECHNIQUE: Multidetector CT imaging of the chest was performed using the standard protocol during bolus administration of intravenous contrast. Multiplanar CT image reconstructions and MIPs were obtained to evaluate the vascular anatomy. RADIATION DOSE REDUCTION: This exam was performed according to the departmental dose-optimization program which includes automated exposure control, adjustment of the mA and/or kV according to patient size and/or use  of iterative reconstruction technique. CONTRAST:  58m OMNIPAQUE IOHEXOL 350 MG/ML SOLN COMPARISON:  February 27, 2020 FINDINGS: Cardiovascular: There is marked severity calcification of the thoracic aorta. The ascending thoracic aorta measures 4.3 cm in diameter. Marked severity intraluminal low attenuation is seen within the mid and distal portions of the right pulmonary artery involvement of proximal right middle lobe  and right lower lobe branches is seen. There is mild cardiomegaly with marked severity coronary artery calcification. Mild right heart strain is seen (RV/LV ratio of 1.14). No pericardial effusion. Mediastinum/Nodes: No enlarged mediastinal, hilar, or axillary lymph nodes. Thyroid gland, trachea, and esophagus demonstrate no significant findings. Lungs/Pleura: Mild left basilar and moderate severity right basilar atelectasis is seen. Small to moderate size bilateral pleural effusions are noted, right slightly greater than left. No pneumothorax is identified. Upper Abdomen: Multiple surgical clips are seen within the gallbladder fossa. Noninflamed diverticula are seen within the visualized portion of the splenic flexure. Musculoskeletal: Multilevel degenerative changes are seen throughout the thoracic spine. Review of the MIP images confirms the above findings. IMPRESSION: 1. Marked severity right-sided pulmonary embolism with mild right heart strain (RV/LV ratio of 1.14). 2. Mild left basilar and moderate severity right basilar atelectasis. 3. Small to moderate size bilateral pleural effusions, right slightly greater than left. 4. Colonic diverticulosis. 5. Evidence of prior cholecystectomy. 6. Aortic atherosclerosis. Aortic Atherosclerosis (ICD10-I70.0). Electronically Signed   By: TVirgina NorfolkM.D.   On: 04/29/2022 17:43   DG Chest 2 View  Result Date: 04/29/2022 CLINICAL DATA:  Fatigue EXAM: CHEST - 2 VIEW COMPARISON:  03/30/2022 FINDINGS: Small bilateral pleural effusion. No visible edema or air bronchogram. Bulky mitral annular calcification. Normal heart size and stable mediastinal contours. IMPRESSION: Small bilateral pleural effusion. Electronically Signed   By: JJorje GuildM.D.   On: 04/29/2022 07:34   CT HEAD WO CONTRAST (5MM)  Result Date: 04/29/2022 CLINICAL DATA:  Neuro deficit with acute stroke suspected EXAM: CT HEAD WITHOUT CONTRAST TECHNIQUE: Contiguous axial images were obtained  from the base of the skull through the vertex without intravenous contrast. RADIATION DOSE REDUCTION: This exam was performed according to the departmental dose-optimization program which includes automated exposure control, adjustment of the mA and/or kV according to patient size and/or use of iterative reconstruction technique. COMPARISON:  Brain MRI 12/30/2021 FINDINGS: Brain: No evidence of acute infarction, hemorrhage, hydrocephalus, or mass lesion/mass effect. Chronic small vessel infarcts in the bilateral cerebellum, bilateral caudate, and left corona radiata. Subdural collections on prior not confidently seen today. Vascular: No hyperdense vessel or unexpected calcification. Skull: Normal. Negative for fracture or focal lesion. Sinuses/Orbits: No acute finding. IMPRESSION: 1. No acute finding. 2. Chronic infra and supratentorial small vessel infarcts. Electronically Signed   By: JJorje GuildM.D.   On: 04/29/2022 07:14        Scheduled Meds:  furosemide  40 mg Intravenous BID   [START ON 05/01/2022] levothyroxine  50 mcg Oral Q M,W,F   levothyroxine  75 mcg Oral Once per day on Sun Tue Thu Sat   lidocaine  1 patch Transdermal Q24H   metoprolol tartrate  12.5 mg Oral Q6H   sodium chloride flush  3 mL Intravenous Q12H   traMADol  50 mg Oral Q6H   Continuous Infusions:  heparin 1,400 Units/hr (04/30/22 1048)     LOS: 1 day     NDesma Maxim MD [facility]   If 7PM-7AM, please contact night-coverage [email or web address] Password [email or web address]1/21/2024, 12:37 PM

## 2022-04-30 NOTE — Progress Notes (Signed)
Echo attempted. Patient nauseous and just received anti-nausea medication. Will attempt again as schedule permits.

## 2022-04-30 NOTE — Progress Notes (Signed)
ANTICOAGULATION CONSULT NOTE -  Pharmacy Consult for Heparin Indication: atrial fibrillation and pulmonary embolus  Allergies  Allergen Reactions   Codeine Nausea Only   Duragesic-100 [Fentanyl] Nausea And Vomiting    Dizziness Lightheadedness    Mucinex [Guaifenesin Er] Other (See Comments)    Syncope    Nsaids Other (See Comments)    Kidney disease    Patient Measurements: Weight: 85.4 kg (188 lb 4.4 oz) Heparin Dosing Weight: 75 kg  Vital Signs: Temp: 98 F (36.7 C) (01/21 1540) Temp Source: Oral (01/21 1540) BP: 111/75 (01/21 1540) Pulse Rate: 100 (01/21 1540)  Labs: Recent Labs    04/29/22 0639 04/29/22 0849 04/30/22 0124 04/30/22 1442  HGB 10.7*  --  11.4*  --   HCT 34.9*  --  35.1*  --   PLT 257  --  218  --   HEPARINUNFRC  --   --  0.24* 0.56  CREATININE 1.37*  --  1.48* 1.61*  TROPONINIHS 24* 24*  --   --      Estimated Creatinine Clearance: 36.3 mL/min (A) (by C-G formula based on SCr of 1.61 mg/dL (H)).   Medical History: Past Medical History:  Diagnosis Date   Back pain    Gout    Murmur    PE (pulmonary embolism)    Persistent atrial fibrillation (HCC)    Pulmonary emboli (HCC) 02/05/2020   Syncope    Thyroid disease     Medications:  Medications Prior to Admission  Medication Sig Dispense Refill Last Dose   amoxicillin-clavulanate (AUGMENTIN) 875-125 MG tablet Take 1 tablet by mouth 2 (two) times daily. Branded Augmentin only per patient pref. 14 tablet 0 04/29/2022   Ascorbic Acid (VITAMIN C) 1000 MG tablet Take 4,000 mg by mouth daily.   04/29/2022   b complex vitamins capsule Take 1 capsule by mouth daily.   04/29/2022   hydrogen peroxide (PEROXYL) 1.5 % SOLN Gargle, swish and spit daily (Patient taking differently: 1 Capful See admin instructions. Gargle, swish and spit 1 capful once daily.) 236 mL 0 04/28/2022   levothyroxine (SYNTHROID) 50 MCG tablet Take 1 tablet (50 mcg) by mouth on Monday, Wednesday, and Friday (Patient taking  differently: Take 50 mcg by mouth See admin instructions. 50 mcg once daily on Monday, Wednesday, Friday.) 45 tablet 11 04/28/2022   levothyroxine (SYNTHROID) 75 MCG tablet Take 1 tablet (75 mcg) by mouth on Tuesday, Thursday, Saturday, and Sunday (Patient taking differently: Take 75 mcg by mouth See admin instructions. 75 mcg once daily on Sunday, Tuesday, Thursday, Saturday.) 45 tablet 11 04/29/2022   OVER THE COUNTER MEDICATION Take 1 tablet by mouth daily with supper. Calcium + D3 + K2   04/28/2022   vitamin E 180 MG (400 UNITS) capsule Take 400 Units by mouth daily.   04/29/2022   clopidogrel (PLAVIX) 75 MG tablet Take 1 tablet (75 mg total) by mouth daily. Take along with aspirin for 3 weeks, following which stop plavix and continue Aspirin (Patient not taking: Reported on 02/10/2022) 21 tablet 0 Not Taking   ezetimibe (ZETIA) 10 MG tablet Take 1 tablet (10 mg total) by mouth daily. (Patient not taking: Reported on 04/29/2022) 30 tablet 2 Not Taking   metoprolol succinate (TOPROL-XL) 25 MG 24 hr tablet Take 25 mg by mouth daily. Pt reports she doesn't take it daily (Patient not taking: Reported on 04/29/2022)   Not Taking    Scheduled:   furosemide  40 mg Intravenous BID   [START ON 05/01/2022]  levothyroxine  50 mcg Oral Q M,W,F   levothyroxine  75 mcg Oral Once per day on Sun Tue Thu Sat   lidocaine  1 patch Transdermal Q24H   metoprolol tartrate  12.5 mg Oral Q6H   senna-docusate  1 tablet Oral QHS   sodium chloride flush  3 mL Intravenous Q12H   traMADol  25 mg Oral Q6H   Infusions:   heparin 1,400 Units/hr (04/30/22 1048)   PRN: acetaminophen **OR** acetaminophen, albuterol, morphine injection, ondansetron **OR** ondansetron (ZOFRAN) IV  Assessment: 43 yof with a history of AF not on anticoagulation, hx of PE, hx of CVA, recent subdural hematoma, hypothyroidism, and chronic back pain. Patient is presenting with decreased sensation of lower legs. Heparin per pharmacy consult placed for  atrial fibrillation and pulmonary embolus.Patient is not on anticoagulation prior to arrival.  CTA PE with marked severity rt-sided PE w/ mild heart strain  Heparin level now within goal range at 0.56.  CBC stable, no infusion issues or overt s/sx of bleeding per RN  Goal of Therapy:  Heparin level 0.3-0.7 units/ml Monitor platelets by anticoagulation protocol: Yes   Plan:  No initial bolus per attending MD given concern for hemorrhage risk and known "possibly chronic" subdural hematomas Continue IV heparin infusion at 1400 units/hr. Repeat heparin level in 6 hrs to confirm. Continue to monitor H&H and platelets  [place] Crane, Roylene Reason, BCCP Clinical Pharmacist  04/30/2022 4:43 PM   [facility] pharmacy phone numbers are listed on [email or web address]

## 2022-04-30 NOTE — Progress Notes (Signed)
PT Cancellation Note  Patient Details Name: Cynthia Cook MRN: [id number] DOB: [birth year]   Cancelled Treatment:    Reason Eval/Treat Not Completed: (P) Medical issues which prohibited therapy. Pt found to have a right-sided PE with mild right heart strain and started on heparin at 21:24 on 04/29/22. Will plan to hold off on PT until >24 hours post heparin initiation per PT protocol.    Moishe Spice, PT, DPT Acute Rehabilitation Services  Office: [place] 04/30/2022, 7:46 AM

## 2022-04-30 NOTE — Progress Notes (Signed)
ANTICOAGULATION CONSULT NOTE -  Pharmacy Consult for Heparin Indication: atrial fibrillation and pulmonary embolus  Allergies  Allergen Reactions   Codeine Nausea Only   Duragesic-100 [Fentanyl] Nausea And Vomiting    Dizziness Lightheadedness    Mucinex [Guaifenesin Er] Other (See Comments)    Syncope    Nsaids Other (See Comments)    Kidney disease    Patient Measurements:   Heparin Dosing Weight: 75 kg  Vital Signs: Temp: 98.3 F (36.8 C) (01/20 2254) Temp Source: Oral (01/20 2254) BP: 133/98 (01/20 2254) Pulse Rate: 89 (01/20 2254)  Labs: Recent Labs    04/29/22 0639 04/29/22 0849 04/30/22 0124  HGB 10.7*  --  11.4*  HCT 34.9*  --  35.1*  PLT 257  --  218  HEPARINUNFRC  --   --  0.24*  CREATININE 1.37*  --  1.48*  TROPONINIHS 24* 24*  --      CrCl cannot be calculated (Unknown ideal weight.).   Medical History: Past Medical History:  Diagnosis Date   Back pain    Gout    Murmur    PE (pulmonary embolism)    Persistent atrial fibrillation (HCC)    Pulmonary emboli (HCC) 02/05/2020   Syncope    Thyroid disease     Medications:  Medications Prior to Admission  Medication Sig Dispense Refill Last Dose   amoxicillin-clavulanate (AUGMENTIN) 875-125 MG tablet Take 1 tablet by mouth 2 (two) times daily. Branded Augmentin only per patient pref. 14 tablet 0 04/29/2022   Ascorbic Acid (VITAMIN C) 1000 MG tablet Take 4,000 mg by mouth daily.   04/29/2022   b complex vitamins capsule Take 1 capsule by mouth daily.   04/29/2022   hydrogen peroxide (PEROXYL) 1.5 % SOLN Gargle, swish and spit daily (Patient taking differently: 1 Capful See admin instructions. Gargle, swish and spit 1 capful once daily.) 236 mL 0 04/28/2022   levothyroxine (SYNTHROID) 50 MCG tablet Take 1 tablet (50 mcg) by mouth on Monday, Wednesday, and Friday (Patient taking differently: Take 50 mcg by mouth See admin instructions. 50 mcg once daily on Monday, Wednesday, Friday.) 45 tablet 11  04/28/2022   levothyroxine (SYNTHROID) 75 MCG tablet Take 1 tablet (75 mcg) by mouth on Tuesday, Thursday, Saturday, and Sunday (Patient taking differently: Take 75 mcg by mouth See admin instructions. 75 mcg once daily on Sunday, Tuesday, Thursday, Saturday.) 45 tablet 11 04/29/2022   OVER THE COUNTER MEDICATION Take 1 tablet by mouth daily with supper. Calcium + D3 + K2   04/28/2022   vitamin E 180 MG (400 UNITS) capsule Take 400 Units by mouth daily.   04/29/2022   clopidogrel (PLAVIX) 75 MG tablet Take 1 tablet (75 mg total) by mouth daily. Take along with aspirin for 3 weeks, following which stop plavix and continue Aspirin (Patient not taking: Reported on 02/10/2022) 21 tablet 0 Not Taking   ezetimibe (ZETIA) 10 MG tablet Take 1 tablet (10 mg total) by mouth daily. (Patient not taking: Reported on 04/29/2022) 30 tablet 2 Not Taking   metoprolol succinate (TOPROL-XL) 25 MG 24 hr tablet Take 25 mg by mouth daily. Pt reports she doesn't take it daily (Patient not taking: Reported on 04/29/2022)   Not Taking    Scheduled:   furosemide  40 mg Intravenous BID   [START ON 05/01/2022] levothyroxine  50 mcg Oral Q M,W,F   levothyroxine  75 mcg Oral Once per day on Sun Tue Thu Sat   lidocaine  1 patch Transdermal Q24H  metoprolol tartrate  12.5 mg Oral Q6H   sodium chloride flush  3 mL Intravenous Q12H   traMADol  50 mg Oral Q6H   Infusions:   heparin 1,250 Units/hr (04/29/22 2124)   PRN: acetaminophen **OR** acetaminophen, albuterol, morphine injection, ondansetron **OR** ondansetron (ZOFRAN) IV  Assessment: 49 yof with a history of AF not on anticoagulation, hx of PE, hx of CVA, recent subdural hematoma, hypothyroidism, and chronic back pain. Patient is presenting with decreased sensation of lower legs. Heparin per pharmacy consult placed for atrial fibrillation and pulmonary embolus.Patient is not on anticoagulation prior to arrival.  CTA PE with marked severity rt-sided PE w/ mild heart  strain  Initial heparin level below goal 0.24 on 1250 units/hr, CBC stable, no infusion issues or overt s/sx of bleeding per RN  Goal of Therapy:  Heparin level 0.3-0.7 units/ml Monitor platelets by anticoagulation protocol: Yes   Plan:  No initial bolus per attending MD given concern for hemorrhage risk and known "possibly chronic" subdural hematomas Increase heparin infusion (~2 units/kg/hr) to 1400 units/hr Check anti-Xa level at 1100 and daily while on heparin Continue to monitor H&H and platelets  Georga Bora, PharmD Clinical Pharmacist 04/30/2022 2:50 AM Please check AMION for all [place] numbers

## 2022-04-30 NOTE — Progress Notes (Signed)
Lower extremity venous has been completed.   Preliminary results in CV Proc.   Bernabe Dorce Monna Crean 04/30/2022 11:06 AM

## 2022-04-30 NOTE — Progress Notes (Signed)
  Echocardiogram 2D Echocardiogram has been performed.  Cynthia Cook 04/30/2022, 11:50 AM

## 2022-04-30 NOTE — Progress Notes (Signed)
ANTICOAGULATION CONSULT NOTE - Follow Up Consult  Pharmacy Consult for heparin Indication: atrial fibrillation and pulmonary embolus  Labs: Recent Labs    04/29/22 0639 04/29/22 0849 04/30/22 0124 04/30/22 1442 04/30/22 2253  HGB 10.7*  --  11.4*  --   --   HCT 34.9*  --  35.1*  --   --   PLT 257  --  218  --   --   HEPARINUNFRC  --   --  0.24* 0.56 0.50  CREATININE 1.37*  --  1.48* 1.61*  --   TROPONINIHS 24* 24*  --   --   --     Assessment/Plan:  67yo female remains therapeutic on heparin. Will continue infusion at current rate of 1400 units/hr and monitor daily level.   Wynona Neat, PharmD, BCPS  04/30/2022,11:17 PM

## 2022-05-01 ENCOUNTER — Other Ambulatory Visit (HOSPITAL_COMMUNITY): Payer: Self-pay

## 2022-05-01 DIAGNOSIS — I429 Cardiomyopathy, unspecified: Secondary | ICD-10-CM

## 2022-05-01 DIAGNOSIS — I5041 Acute combined systolic (congestive) and diastolic (congestive) heart failure: Secondary | ICD-10-CM | POA: Diagnosis not present

## 2022-05-01 DIAGNOSIS — I2609 Other pulmonary embolism with acute cor pulmonale: Secondary | ICD-10-CM | POA: Diagnosis not present

## 2022-05-01 DIAGNOSIS — I480 Paroxysmal atrial fibrillation: Secondary | ICD-10-CM | POA: Diagnosis not present

## 2022-05-01 DIAGNOSIS — N1831 Chronic kidney disease, stage 3a: Secondary | ICD-10-CM

## 2022-05-01 LAB — CBC
HCT: 32.9 % — ABNORMAL LOW (ref 36.0–46.0)
Hemoglobin: 10.6 g/dL — ABNORMAL LOW (ref 12.0–15.0)
MCH: 28.3 pg (ref 26.0–34.0)
MCHC: 32.2 g/dL (ref 30.0–36.0)
MCV: 88 fL (ref 80.0–100.0)
Platelets: 223 10*3/uL (ref 150–400)
RBC: 3.74 MIL/uL — ABNORMAL LOW (ref 3.87–5.11)
RDW: 14.8 % (ref 11.5–15.5)
WBC: 7.3 10*3/uL (ref 4.0–10.5)
nRBC: 0 % (ref 0.0–0.2)

## 2022-05-01 LAB — BASIC METABOLIC PANEL
Anion gap: 7 (ref 5–15)
BUN: 47 mg/dL — ABNORMAL HIGH (ref 8–23)
CO2: 31 mmol/L (ref 22–32)
Calcium: 8.7 mg/dL — ABNORMAL LOW (ref 8.9–10.3)
Chloride: 101 mmol/L (ref 98–111)
Creatinine, Ser: 1.57 mg/dL — ABNORMAL HIGH (ref 0.44–1.00)
GFR, Estimated: 35 mL/min — ABNORMAL LOW (ref >60)
Glucose, Bld: 122 mg/dL — ABNORMAL HIGH (ref 70–99)
Potassium: 4.7 mmol/L (ref 3.5–5.1)
Sodium: 139 mmol/L (ref 135–145)

## 2022-05-01 LAB — HEPARIN LEVEL (UNFRACTIONATED): Heparin Unfractionated: 0.56 IU/mL (ref 0.30–0.70)

## 2022-05-01 MED ORDER — APIXABAN 5 MG PO TABS: 5.0000 mg | ORAL_TABLET | Freq: Two times a day (BID) | ORAL | Status: DC

## 2022-05-01 MED ORDER — BISOPROLOL FUMARATE 5 MG PO TABS
2.5000 mg | ORAL_TABLET | Freq: Every day | ORAL | Status: DC
  Administered 2022-05-01 – 2022-05-02 (×2): 2.5 mg via ORAL
  Filled 2022-05-01 (×2): qty 1

## 2022-05-01 MED ORDER — GLYCERIN (LAXATIVE) 2 G RE SUPP
1.0000 | Freq: Every day | RECTAL | Status: DC | PRN
  Administered 2022-05-02 – 2022-05-05 (×3): 1 via RECTAL
  Filled 2022-05-01 (×4): qty 1

## 2022-05-01 MED ORDER — APIXABAN 5 MG PO TABS
10.0000 mg | ORAL_TABLET | Freq: Two times a day (BID) | ORAL | Status: DC
  Administered 2022-05-01 – 2022-05-05 (×9): 10 mg via ORAL
  Filled 2022-05-01 (×9): qty 2

## 2022-05-01 NOTE — Progress Notes (Signed)
ANTICOAGULATION CONSULT NOTE - Follow Up Consult  Pharmacy Consult for Heparin > Eliquis Indication: pulmonary embolus  Allergies  Allergen Reactions   Codeine Nausea Only   Duragesic-100 [Fentanyl] Nausea And Vomiting    Dizziness Lightheadedness    Mucinex [Guaifenesin Er] Other (See Comments)    Syncope    Nsaids Other (See Comments)    Kidney disease    Patient Measurements: Height: '5\' 6"'$  (167.6 cm) Weight: 85.4 kg (188 lb 4.4 oz) IBW/kg (Calculated) : 59.3 Heparin Dosing Weight: 75 kg  Vital Signs: Temp: 98.3 F (36.8 C) (01/22 0813) Temp Source: Oral (01/22 0813) BP: 114/74 (01/22 0813) Pulse Rate: 105 (01/22 0813)  Labs: Recent Labs    04/29/22 9233 04/29/22 0076 04/29/22 0849 04/30/22 0124 04/30/22 1442 04/30/22 2253 05/01/22 0051  HGB 10.7*  --   --  11.4*  --   --  10.6*  HCT 34.9*  --   --  35.1*  --   --  32.9*  PLT 257  --   --  218  --   --  223  HEPARINUNFRC  --    < >  --  0.24* 0.56 0.50 0.56  CREATININE 1.37*  --   --  1.48* 1.61*  --  1.57*  TROPONINIHS 24*  --  24*  --   --   --   --    < > = values in this interval not displayed.    Estimated Creatinine Clearance: 37.2 mL/min (A) (by C-G formula based on SCr of 1.57 mg/dL (H)).  Assessment: 1 yof with a history of AF not on anticoagulation, hx of PE, hx of CVA, recent subdural hematoma, hypothyroidism, and chronic back pain. Patient is presenting with decreased sensation of lower legs. Heparin per pharmacy consult placed for atrial fibrillation and pulmonary embolus.Patient is not on anticoagulation prior to arrival. CTA PE with marked severity rt-sided PE w/ mild heart strain. Duplex negative for DVT.  Heparin level therapeutic (0.56) on 1400 units/hr. CBC stable.  To transition to Eliquis. Noted prior Xarelto use with easy bruising/bleeding and cost issues. Lifelong anticoagulation recommended.   Goal of Therapy:  Heparin level 0.3-0.7 units/ml Appropriate Eliquis regimen for  indication Monitor platelets by anticoagulation protocol: Yes   Plan:  Eliquis 10 mg BID x 1 week, then 5 mg BID. Stop IV heparin when giving first Eliquis dose. Intermittent CBC; monitor for signs/symptoms of bleeding. Eliquis copay: $47.  Arty Baumgartner, RPh 05/01/2022,11:40 AM

## 2022-05-01 NOTE — Progress Notes (Signed)
PROGRESS NOTE    Cynthia Cook  HFW:[id number] DOB: 1955-10-21 DOA: 04/29/2022 PCP: Lurline Del, DO     Brief Narrative:   From admission h and p  Cynthia Cook is a 67 y.o. female with medical history significant of paroxysmal atrial fibrillation not on anticoagulation, history of pulmonary embolism not on anticoagulation, history of CVA, subdural hematoma, hypothyroidism, and chronic back pain who presents with complaints of decreased sensation in her lower legs.  She states that she had a fall on December 14 and had been diagnosed with a compression fracture of her lumbar spine.  Ever since that time she has had paresthesias in her feet and unable to walk in a straight line or stand due to having severe pain. Patient had been diagnosed with RSV 03/30/2022.  Since that time she had a progressive decline and has not been doing well or able to care for self.  Patient states that her dog had died and so she is at home alone.  Neighbors have been bringing food to her that is not the healthiest for which she noticed that she had started to have lower extremity swelling over the last several days.  She has a history of atrial fibrillation and previously had been on Eliquis, but had not been able to afford this medication to continue it.  She reports not being able to tolerate Coumadin due to bleeding and has been taking a Nattokinase supplement to thin her blood instead.  She denies having any chest pain or palpitations, but has felt more short of breath.  Patient patient is not on any blood pressure medication and states that normally her blood pressures and heart rates are well-controlled. She had not been able to follow-up with anyone and states she is felt like she is falling through the cracks.   Review of records note that patient had been admitted with dysarthria found to have an acute stroke with what was thought to be chronic bilateral subdural hematomas back in 12/2021 where she was advised to  need to be on anticoagulation and declined at that time for which she was told that she would likely have recurrent strokes could be like threatening and disabling.     Assessment & Plan:   Principal Problem:   CHF (congestive heart failure) (HCC) Active Problems:   Atrial fibrillation (HCC)   Pulmonary embolism (HCC)   History of pulmonary embolism   Elevated troponin I level   Lumbar compression fracture (HCC)   Normocytic anemia   CKD (chronic kidney disease) stage 3, GFR 30-59 ml/min (HCC) - baseline SCr 1.3   Subdural hematoma (HCC)   History of CVA (cerebrovascular accident)   Hypothyroidism  # Pulmonary embolism # Hypoxic respiratory failure Acute, severe right-sided PE on CTA, evidence right heart strain. Hemodynamically stable and relatively asymptomatic. PVL initial read negative for DVT. Hx of PE in past, not anticoagulated at time of presentation. Risk factors include recent illness (rsv) and relative immobility 2/2 recent fall with lumbar compression fracture.  - stop heparin, start apixaban - weaning O2  # HFrEF 2/2 PE. TTE with EF 30-35, moderately reduced RV function. Likely a consequence of PE, a-fib.  - cardiology following - continuing IV lasix - bisoprolol started  # A-fib with RVR Relatively asymtpomatic. HR improved, now 100s at rest, chads2vasc is 7. - bisoprolol as above - apixaban as above  # Lumbar compression fracture Patient reports recent fall evaluated at [facility] with mri which showed lumbar compression fracture - pain  control - outpt ortho f/u - pt/ot consulted  # CKD 3b Stable - monitor  # Hx CVA - home plavix discontinued; anticoagulation as above - not on statin due to patient preference  # Hypothyroid TSH mildly elevated - cont home levothyroxine, will need outpt repeat of tsh    DVT prophylaxis: apixaban Code Status: full Family Communication: none @ bedside  Level of care: Telemetry Cardiac Status is:  Inpatient Remains inpatient appropriate because: severity of illness    Consultants:  cardiology  Procedures: none  Antimicrobials:  none    Subjective: Breathing stable, pain stable, tolerating diet  Objective: Vitals:   04/30/22 2200 05/01/22 0639 05/01/22 0645 05/01/22 0813  BP:  (!) 103/48 (!) 99/58 114/74  Pulse: 87 100 97 (!) 105  Resp: '14 18 18 18  '$ Temp:  97.7 F (36.5 C) 97.7 F (36.5 C) 98.3 F (36.8 C)  TempSrc:  Oral Oral Oral  SpO2: 100% 100% 100% 100%  Weight:        Intake/Output Summary (Last 24 hours) at 05/01/2022 1109 Last data filed at 05/01/2022 0856 Gross per 24 hour  Intake 759.19 ml  Output 1350 ml  Net -590.81 ml   Filed Weights   04/30/22 0408  Weight: 85.4 kg    Examination:  General exam: NAD Respiratory system: rales right base Cardiovascular system: S1 & S2 heard, RRR. No JVD, murmurs, rubs, gallops or clicks. Gastrointestinal system: Abdomen is nondistended, soft and nontender. No organomegaly or masses felt. Normal bowel sounds heard. Central nervous system: Alert and oriented. No focal neurological deficits. Extremities: Symmetric 5 x 5 power. Skin: No rashes, lesions or ulcers. Edema to knees Psychiatry: Judgement and insight appear normal. tearful    Data Reviewed: I have personally reviewed following labs and imaging studies  CBC: Recent Labs  Lab 04/29/22 0639 04/30/22 0124 05/01/22 0051  WBC 6.1 9.0 7.3  HGB 10.7* 11.4* 10.6*  HCT 34.9* 35.1* 32.9*  MCV 90.6 87.1 88.0  PLT 257 218 673   Basic Metabolic Panel: Recent Labs  Lab 04/29/22 0639 04/30/22 0124 04/30/22 1442 05/01/22 0051  NA 142 139 140 139  K 4.4 5.2* 4.6 4.7  CL 105 104 103 101  CO2 '26 25 26 31  '$ GLUCOSE 96 121* 130* 122*  BUN 46* 44* 45* 47*  CREATININE 1.37* 1.48* 1.61* 1.57*  CALCIUM 9.1 9.0 8.8* 8.7*  MG  --  2.1  --   --    GFR: Estimated Creatinine Clearance: 37.2 mL/min (A) (by C-G formula based on SCr of 1.57 mg/dL  (H)). Liver Function Tests: Recent Labs  Lab 04/29/22 0639  AST 24  ALT 23  ALKPHOS 66  BILITOT 0.7  PROT 6.8  ALBUMIN 3.0*   No results for input(s): "LIPASE", "AMYLASE" in the last 168 hours. No results for input(s): "AMMONIA" in the last 168 hours. Coagulation Profile: No results for input(s): "INR", "PROTIME" in the last 168 hours. Cardiac Enzymes: No results for input(s): "CKTOTAL", "CKMB", "CKMBINDEX", "TROPONINI" in the last 168 hours. BNP (last 3 results) No results for input(s): "PROBNP" in the last 8760 hours. HbA1C: No results for input(s): "HGBA1C" in the last 72 hours. CBG: No results for input(s): "GLUCAP" in the last 168 hours. Lipid Profile: No results for input(s): "CHOL", "HDL", "LDLCALC", "TRIG", "CHOLHDL", "LDLDIRECT" in the last 72 hours. Thyroid Function Tests: Recent Labs    04/29/22 0849  TSH 4.812*   Anemia Panel: No results for input(s): "VITAMINB12", "FOLATE", "FERRITIN", "TIBC", "IRON", "RETICCTPCT" in the  last 72 hours. Urine analysis:    Component Value Date/Time   COLORURINE YELLOW 04/29/2022 1949   APPEARANCEUR HAZY (A) 04/29/2022 1949   LABSPEC 1.015 04/29/2022 1949   PHURINE 5.0 04/29/2022 1949   GLUCOSEU NEGATIVE 04/29/2022 1949   HGBUR NEGATIVE 04/29/2022 1949   BILIRUBINUR NEGATIVE 04/29/2022 1949   Wilson NEGATIVE 04/29/2022 1949   PROTEINUR NEGATIVE 04/29/2022 1949   NITRITE NEGATIVE 04/29/2022 1949   LEUKOCYTESUR SMALL (A) 04/29/2022 1949   Sepsis Labs: '@LABRCNTIP'$ (procalcitonin:4,lacticidven:4)  ) Recent Results (from the past 240 hour(s))  Culture, blood (single)     Status: None (Preliminary result)   Collection Time: 04/29/22  6:40 AM   Specimen: BLOOD LEFT ARM  Result Value Ref Range Status   Specimen Description BLOOD LEFT ARM  Final   Special Requests   Final    BOTTLES DRAWN AEROBIC AND ANAEROBIC Blood Culture adequate volume   Culture   Final    NO GROWTH 2 DAYS Performed at [facility], [place] [address], [place], [place] [address]    Report Status PENDING  Incomplete         Radiology Studies: VAS [place] LOWER EXTREMITY VENOUS (DVT)  Result Date: 04/30/2022  Lower Venous DVT Study Patient Name:  VICKI CHAFFIN  Date of Exam:   04/30/2022 Medical Rec #: [id number]       Accession #:    [id number] Date of Birth: 21-Sep-1955        Patient Gender: F Patient Age:   44 years Exam Location:  [facility] Procedure:      VAS [place] LOWER EXTREMITY VENOUS (DVT) Referring Phys: Bellotti Plan --------------------------------------------------------------------------------  Indications: Pulmonary embolism.  Comparison Study: no prior Performing Technologist: Archie Patten RVS  Examination Guidelines: A complete evaluation includes B-mode imaging, spectral Doppler, color Doppler, and power Doppler as needed of all accessible portions of each vessel. Bilateral testing is considered an integral part of a complete examination. Limited examinations for reoccurring indications may be performed as noted. The reflux portion of the exam is performed with the patient in reverse Trendelenburg.  +--------+---------------+---------+-----------+----------+--------------------+ RIGHT   CompressibilityPhasicitySpontaneityPropertiesThrombus Aging       +--------+---------------+---------+-----------+----------+--------------------+ CFV     Full           Yes      Yes                                       +--------+---------------+---------+-----------+----------+--------------------+ SFJ     Full                                                              +--------+---------------+---------+-----------+----------+--------------------+ FV Prox Full                                                              +--------+---------------+---------+-----------+----------+--------------------+ FV Mid  Full                                                               +--------+---------------+---------+-----------+----------+--------------------+  FV      Full                                                              Distal                                                                    +--------+---------------+---------+-----------+----------+--------------------+ PFV     Full                                                              +--------+---------------+---------+-----------+----------+--------------------+ POP     Full           Yes      Yes                                       +--------+---------------+---------+-----------+----------+--------------------+ PTV     Full                                                              +--------+---------------+---------+-----------+----------+--------------------+ PERO                   Yes      Yes                  patent by color                                                           doppler              +--------+---------------+---------+-----------+----------+--------------------+   +---------+---------------+---------+-----------+----------+-------------------+ LEFT     CompressibilityPhasicitySpontaneityPropertiesThrombus Aging      +---------+---------------+---------+-----------+----------+-------------------+ CFV      Full           Yes                                               +---------+---------------+---------+-----------+----------+-------------------+ SFJ      Full                                                             +---------+---------------+---------+-----------+----------+-------------------+  FV Prox  Full                                                             +---------+---------------+---------+-----------+----------+-------------------+ FV Mid   Full                                                             +---------+---------------+---------+-----------+----------+-------------------+ FV  DistalFull                                                             +---------+---------------+---------+-----------+----------+-------------------+ PFV      Full                                                             +---------+---------------+---------+-----------+----------+-------------------+ POP      Full           Yes      Yes                                      +---------+---------------+---------+-----------+----------+-------------------+ PTV      Full                                                             +---------+---------------+---------+-----------+----------+-------------------+ PERO                                                  Not well visualized +---------+---------------+---------+-----------+----------+-------------------+     Summary: RIGHT: - There is no evidence of deep vein thrombosis in the lower extremity.  - No cystic structure found in the popliteal fossa.  LEFT: - There is no evidence of deep vein thrombosis in the lower extremity.  - A cystic structure is found in the popliteal fossa.  *See table(s) above for measurements and observations. Electronically signed by Harold Barban MD on 04/30/2022 at 2:13:52 PM.    Final    ECHOCARDIOGRAM COMPLETE  Result Date: 04/30/2022    ECHOCARDIOGRAM REPORT   Patient Name:   Jaretzi Droz Date of Exam: 04/30/2022 Medical Rec #:  [id number]      Height:       66.0 in Accession #:    [id number]     Weight:       188.3 lb Date of Birth:  05-18-1955  BSA:          1.949 m Patient Age:    68 years       BP:           118/81 mmHg Patient Gender: F              HR:           103 bpm. Exam Location:  Inpatient Procedure: 2D Echo, Cardiac Doppler and Color Doppler Indications:    Pulmonary embolus  History:        Patient has prior history of Echocardiogram examinations, most                 recent 12/31/2021. CHF; Arrythmias:Atrial Fibrillation. Elevated                 troponin. CKD. Hx CVA.   Sonographer:    Clayton Lefort RDCS (AE) Referring Phys: Eustaquio Boyden A SMITH IMPRESSIONS  1. Left ventricular ejection fraction, by estimation, is 30 to 35%. The left ventricle has moderately decreased function. The left ventricle demonstrates global hypokinesis. There is moderate left ventricular hypertrophy. Left ventricular diastolic parameters are indeterminate.  2. Right ventricular systolic function is moderately reduced. The right ventricular size is mildly enlarged. Tricuspid regurgitation signal is inadequate for assessing PA pressure.  3. Left atrial size was severely dilated.  4. Right atrial size was severely dilated.  5. The mitral valve is degenerative. Mild to moderate mitral valve regurgitation. Moderate mitral stenosis. The mean mitral valve gradient is 8.0 mmHg with average heart rate of 102 bpm. Severe mitral annular calcification. MVA 1.5 cm^2 by continuity equation  6. The aortic valve is calcified. There is moderate calcification of the aortic valve. Aortic valve regurgitation is trivial. Moderate aortic valve stenosis. Mild AS by gradients (Vmax 2.1 m/s, MG 35mHg), moderate by AVA (1.2 cm^2) and DI (0.4). Suspect low flow low gradient moderate AS  7. The inferior vena cava is dilated in size with <50% respiratory variability, suggesting right atrial pressure of 15 mmHg. FINDINGS  Left Ventricle: Left ventricular ejection fraction, by estimation, is 30 to 35%. The left ventricle has moderately decreased function. The left ventricle demonstrates global hypokinesis. The left ventricular internal cavity size was small. There is moderate left ventricular hypertrophy. Left ventricular diastolic parameters are indeterminate. Right Ventricle: The right ventricular size is mildly enlarged. Right vetricular wall thickness was not well visualized. Right ventricular systolic function is moderately reduced. Tricuspid regurgitation signal is inadequate for assessing PA pressure. Left Atrium: Left atrial size was  severely dilated. Right Atrium: Right atrial size was severely dilated. Pericardium: There is no evidence of pericardial effusion. Mitral Valve: The mitral valve is degenerative in appearance. Severe mitral annular calcification. Mild to moderate mitral valve regurgitation. Moderate mitral valve stenosis. MV peak gradient, 15.4 mmHg. The mean mitral valve gradient is 8.0 mmHg with average heart rate of 102 bpm. Tricuspid Valve: The tricuspid valve is normal in structure. Tricuspid valve regurgitation is trivial. Aortic Valve: The aortic valve is calcified. There is moderate calcification of the aortic valve. Aortic valve regurgitation is trivial. Moderate aortic stenosis is present. Aortic valve mean gradient measures 10.0 mmHg. Aortic valve peak gradient measures 18.0 mmHg. Aortic valve area, by VTI measures 1.24 cm. Pulmonic Valve: The pulmonic valve was not well visualized. Pulmonic valve regurgitation is trivial. Aorta: The aortic root is normal in size and structure. Venous: The inferior vena cava is dilated in size with less than 50% respiratory variability, suggesting right atrial pressure  of 15 mmHg. IAS/Shunts: The interatrial septum was not well visualized.  LEFT VENTRICLE PLAX 2D LVIDd:         3.40 cm LVIDs:         3.00 cm LV PW:         1.30 cm LV IVS:        1.50 cm LVOT diam:     2.00 cm LV SV:         43 LV SV Index:   22 LVOT Area:     3.14 cm  RIGHT VENTRICLE            IVC RV Basal diam:  4.30 cm    IVC diam: 2.30 cm RV Mid diam:    4.00 cm RV S prime:     8.27 cm/s TAPSE (M-mode): 1.0 cm LEFT ATRIUM              Index        RIGHT ATRIUM           Index LA diam:        3.90 cm  2.00 cm/m   RA Area:     29.20 cm LA Vol (A2C):   104.0 ml 53.36 ml/m  RA Volume:   117.00 ml 60.03 ml/m LA Vol (A4C):   91.4 ml  46.89 ml/m LA Biplane Vol: 100.0 ml 51.31 ml/m  AORTIC VALVE AV Area (Vmax):    1.22 cm AV Area (Vmean):   1.28 cm AV Area (VTI):     1.24 cm AV Vmax:           212.00 cm/s AV Vmean:           143.000 cm/s AV VTI:            0.350 m AV Peak Grad:      18.0 mmHg AV Mean Grad:      10.0 mmHg LVOT Vmax:         82.30 cm/s LVOT Vmean:        58.100 cm/s LVOT VTI:          0.138 m LVOT/AV VTI ratio: 0.39  AORTA Ao Root diam: 3.10 cm MITRAL VALVE MV Area VTI:  1.54 cm        SHUNTS MV Peak grad: 15.4 mmHg       Systemic VTI:  0.14 m MV Mean grad: 8.0 mmHg        Systemic Diam: 2.00 cm MV Vmax:      1.96 m/s MV Vmean:     130.0 cm/s MR Peak grad:    104.0 mmHg MR Mean grad:    63.0 mmHg MR Vmax:         510.00 cm/s MR Vmean:        370.0 cm/s MR PISA:         1.57 cm MR PISA Eff ROA: 12 mm MR PISA Radius:  0.50 cm Oswaldo Milian MD Electronically signed by Oswaldo Milian MD Signature Date/Time: 04/30/2022/1:54:53 PM    Final    CT Angio Chest PE W/Cm &/Or Wo Cm  Result Date: 04/29/2022 CLINICAL DATA:  Suspected pulmonary embolism. EXAM: CT ANGIOGRAPHY CHEST WITH CONTRAST TECHNIQUE: Multidetector CT imaging of the chest was performed using the standard protocol during bolus administration of intravenous contrast. Multiplanar CT image reconstructions and MIPs were obtained to evaluate the vascular anatomy. RADIATION DOSE REDUCTION: This exam was performed according to the departmental dose-optimization program which includes automated exposure control, adjustment of the  mA and/or kV according to patient size and/or use of iterative reconstruction technique. CONTRAST:  37m OMNIPAQUE IOHEXOL 350 MG/ML SOLN COMPARISON:  February 27, 2020 FINDINGS: Cardiovascular: There is marked severity calcification of the thoracic aorta. The ascending thoracic aorta measures 4.3 cm in diameter. Marked severity intraluminal low attenuation is seen within the mid and distal portions of the right pulmonary artery involvement of proximal right middle lobe and right lower lobe branches is seen. There is mild cardiomegaly with marked severity coronary artery calcification. Mild right heart strain is seen  (RV/LV ratio of 1.14). No pericardial effusion. Mediastinum/Nodes: No enlarged mediastinal, hilar, or axillary lymph nodes. Thyroid gland, trachea, and esophagus demonstrate no significant findings. Lungs/Pleura: Mild left basilar and moderate severity right basilar atelectasis is seen. Small to moderate size bilateral pleural effusions are noted, right slightly greater than left. No pneumothorax is identified. Upper Abdomen: Multiple surgical clips are seen within the gallbladder fossa. Noninflamed diverticula are seen within the visualized portion of the splenic flexure. Musculoskeletal: Multilevel degenerative changes are seen throughout the thoracic spine. Review of the MIP images confirms the above findings. IMPRESSION: 1. Marked severity right-sided pulmonary embolism with mild right heart strain (RV/LV ratio of 1.14). 2. Mild left basilar and moderate severity right basilar atelectasis. 3. Small to moderate size bilateral pleural effusions, right slightly greater than left. 4. Colonic diverticulosis. 5. Evidence of prior cholecystectomy. 6. Aortic atherosclerosis. Aortic Atherosclerosis (ICD10-I70.0). Electronically Signed   By: TVirgina NorfolkM.D.   On: 04/29/2022 17:43        Scheduled Meds:  bisoprolol  2.5 mg Oral Daily   furosemide  40 mg Intravenous BID   levothyroxine  50 mcg Oral Q M,W,F   levothyroxine  75 mcg Oral Once per day on Sun Tue Thu Sat   lidocaine  1 patch Transdermal Q24H   senna-docusate  1 tablet Oral QHS   sodium chloride flush  3 mL Intravenous Q12H   traMADol  25 mg Oral Q6H   Continuous Infusions:     LOS: 2 days     NDesma Maxim MD [facility]   If 7PM-7AM, please contact night-coverage [email or web address] Password [email or web address]1/22/2024, 11:09 AM

## 2022-05-01 NOTE — Progress Notes (Signed)
Rounding Note    Patient Name: Cynthia Cook Date of Encounter: 05/01/2022  Cynthia Cardiologist: Buford Dresser, MD   Subjective   Spent extensive time at the bedside today. Reviewed recent history. Since her admission in 12/2021 (at which time her dog passed away as well), she has been having significant difficulty. She had a fall in December resulting in a lumbar fracture, and she then had RSV pneumonia. Due to this, she has been largely sedentary and walking only a few feet at a time. She has been receiving help from her church, but her debility, shortness of breath, and edema worsened, prompting her to seek medical evaluation.  She is intermittently tearful during our talk. She notes that she was taking nattokinase at home for at least three months before her current admission. She notes bleeding/easy bruising on DOAC, was taking inconsistently in the past. Cost was also a factor. She feels terrible on beta blocker. We reviewed her CT results as well as her echo. I told her that her EF has dropped, and her RV is also affected. I discussed GDMT with her, as well as cath. She does not want cath. She is hesitant about medications, see review below.  She feels that she has diuresed well, notes she has put out >3L by her estimation. Breathing still short but improving.  Inpatient Medications    Scheduled Meds:  bisoprolol  2.5 mg Oral Daily   furosemide  40 mg Intravenous BID   levothyroxine  50 mcg Oral Q M,W,F   levothyroxine  75 mcg Oral Once per day on Sun Tue Thu Sat   lidocaine  1 patch Transdermal Q24H   senna-docusate  1 tablet Oral QHS   sodium chloride flush  3 mL Intravenous Q12H   traMADol  25 mg Oral Q6H   Continuous Infusions:  heparin 1,400 Units/hr (05/01/22 0634)   PRN Meds: acetaminophen **OR** acetaminophen, albuterol, morphine injection, ondansetron **OR** ondansetron (ZOFRAN) IV   Vital Signs    Vitals:   04/30/22 2200 05/01/22 0639  05/01/22 0645 05/01/22 0813  BP:  (!) 103/48 (!) 99/58 114/74  Pulse: 87 100 97 (!) 105  Resp: '14 18 18 18  '$ Temp:  97.7 F (36.5 C) 97.7 F (36.5 C) 98.3 F (36.8 C)  TempSrc:  Oral Oral Oral  SpO2: 100% 100% 100% 100%  Weight:        Intake/Output Summary (Last 24 hours) at 05/01/2022 1023 Last data filed at 05/01/2022 0856 Gross per 24 hour  Intake 759.19 ml  Output 1550 ml  Net -790.81 ml      04/30/2022    4:08 AM 02/10/2022    1:25 PM 12/30/2021    1:17 PM  Last 3 Weights  Weight (lbs) 188 lb 4.4 oz 174 lb 9.6 oz 178 lb 9.2 oz  Weight (kg) 85.4 kg 79.198 kg 81 kg      Telemetry    Atrial fibrillation, rates 90s-110s - Personally Reviewed  ECG    No new since 04/29/22 - Personally Reviewed  Physical Exam   GEN: No acute distress.  Krum in place.  Neck: No JVD Cardiac: IRIR, no rubs, or gallops.  Respiratory: diminished bilateral bases GI: Soft, nontender, non-distended  MS: 1+ bilateral LE edema; No deformity. Neuro:  Nonfocal  Psych: Intermittently tearful  Labs    High Sensitivity Troponin:   Recent Labs  Lab 04/29/22 0639 04/29/22 0849  TROPONINIHS 24* 24*     Chemistry Recent Labs  Lab  04/29/22 0639 04/30/22 0124 04/30/22 1442 05/01/22 0051  NA 142 139 140 139  K 4.4 5.2* 4.6 4.7  CL 105 104 103 101  CO2 '26 25 26 31  '$ GLUCOSE 96 121* 130* 122*  BUN 46* 44* 45* 47*  CREATININE 1.37* 1.48* 1.61* 1.57*  CALCIUM 9.1 9.0 8.8* 8.7*  MG  --  2.1  --   --   PROT 6.8  --   --   --   ALBUMIN 3.0*  --   --   --   AST 24  --   --   --   ALT 23  --   --   --   ALKPHOS 66  --   --   --   BILITOT 0.7  --   --   --   GFRNONAA 42* 38* 34* 35*  ANIONGAP '11 10 11 7    '$ Lipids No results for input(s): "CHOL", "TRIG", "HDL", "LABVLDL", "LDLCALC", "CHOLHDL" in the last 168 hours.  Hematology Recent Labs  Lab 04/29/22 0639 04/30/22 0124 05/01/22 0051  WBC 6.1 9.0 7.3  RBC 3.85* 4.03 3.74*  HGB 10.7* 11.4* 10.6*  HCT 34.9* 35.1* 32.9*  MCV 90.6 87.1  88.0  MCH 27.8 28.3 28.3  MCHC 30.7 32.5 32.2  RDW 14.9 14.8 14.8  PLT 257 218 223   Thyroid  Recent Labs  Lab 04/29/22 0849  TSH 4.812*    BNP Recent Labs  Lab 04/29/22 0639  BNP 580.5*    DDimer No results for input(s): "DDIMER" in the last 168 hours.   Radiology    VAS [place] LOWER EXTREMITY VENOUS (DVT)  Result Date: 04/30/2022  Lower Venous DVT Study Patient Name:  Cynthia Cook  Date of Exam:   04/30/2022 Medical Rec #: [id number]       Accession #:    [id number] Date of Birth: [birth year]        Patient Gender: F Patient Age:   33 years Exam Location:  [facility] Procedure:      VAS [place] LOWER EXTREMITY VENOUS (DVT) Referring Phys: Salm Plan --------------------------------------------------------------------------------  Indications: Pulmonary embolism.  Comparison Study: no prior Performing Technologist: Archie Patten RVS  Examination Guidelines: A complete evaluation includes B-mode imaging, spectral Doppler, color Doppler, and power Doppler as needed of all accessible portions of each vessel. Bilateral testing is considered an integral part of a complete examination. Limited examinations for reoccurring indications may be performed as noted. The reflux portion of the exam is performed with the patient in reverse Trendelenburg.  +--------+---------------+---------+-----------+----------+--------------------+ RIGHT   CompressibilityPhasicitySpontaneityPropertiesThrombus Aging       +--------+---------------+---------+-----------+----------+--------------------+ CFV     Full           Yes      Yes                                       +--------+---------------+---------+-----------+----------+--------------------+ SFJ     Full                                                              +--------+---------------+---------+-----------+----------+--------------------+ FV Prox Full                                                               +--------+---------------+---------+-----------+----------+--------------------+  FV Mid  Full                                                              +--------+---------------+---------+-----------+----------+--------------------+ FV      Full                                                              Distal                                                                    +--------+---------------+---------+-----------+----------+--------------------+ PFV     Full                                                              +--------+---------------+---------+-----------+----------+--------------------+ POP     Full           Yes      Yes                                       +--------+---------------+---------+-----------+----------+--------------------+ PTV     Full                                                              +--------+---------------+---------+-----------+----------+--------------------+ PERO                   Yes      Yes                  patent by color                                                           doppler              +--------+---------------+---------+-----------+----------+--------------------+   +---------+---------------+---------+-----------+----------+-------------------+ LEFT     CompressibilityPhasicitySpontaneityPropertiesThrombus Aging      +---------+---------------+---------+-----------+----------+-------------------+ CFV      Full           Yes                                               +---------+---------------+---------+-----------+----------+-------------------+  SFJ      Full                                                             +---------+---------------+---------+-----------+----------+-------------------+ FV Prox  Full                                                             +---------+---------------+---------+-----------+----------+-------------------+ FV  Mid   Full                                                             +---------+---------------+---------+-----------+----------+-------------------+ FV DistalFull                                                             +---------+---------------+---------+-----------+----------+-------------------+ PFV      Full                                                             +---------+---------------+---------+-----------+----------+-------------------+ POP      Full           Yes      Yes                                      +---------+---------------+---------+-----------+----------+-------------------+ PTV      Full                                                             +---------+---------------+---------+-----------+----------+-------------------+ PERO                                                  Not well visualized +---------+---------------+---------+-----------+----------+-------------------+     Summary: RIGHT: - There is no evidence of deep vein thrombosis in the lower extremity.  - No cystic structure found in the popliteal fossa.  LEFT: - There is no evidence of deep vein thrombosis in the lower extremity.  - A cystic structure is found in the popliteal fossa.  *See table(s) above for measurements and observations. Electronically signed by Harold Barban MD on 04/30/2022 at 2:13:52 PM.    Final    ECHOCARDIOGRAM COMPLETE  Result  Date: 04/30/2022    ECHOCARDIOGRAM REPORT   Patient Name:   Emile Ringgenberg Date of Exam: 04/30/2022 Medical Rec #:  [id number]      Height:       66.0 in Accession #:    [id number]     Weight:       188.3 lb Date of Birth:  Jul 15, 1955       BSA:          1.949 m Patient Age:    67 years       BP:           118/81 mmHg Patient Gender: F              HR:           103 bpm. Exam Location:  Inpatient Procedure: 2D Echo, Cardiac Doppler and Color Doppler Indications:    Pulmonary embolus  History:        Patient has prior history of  Echocardiogram examinations, most                 recent 12/31/2021. CHF; Arrythmias:Atrial Fibrillation. Elevated                 troponin. CKD. Hx CVA.  Sonographer:    Clayton Lefort RDCS (AE) Referring Phys: Eustaquio Boyden A SMITH IMPRESSIONS  1. Left ventricular ejection fraction, by estimation, is 30 to 35%. The left ventricle has moderately decreased function. The left ventricle demonstrates global hypokinesis. There is moderate left ventricular hypertrophy. Left ventricular diastolic parameters are indeterminate.  2. Right ventricular systolic function is moderately reduced. The right ventricular size is mildly enlarged. Tricuspid regurgitation signal is inadequate for assessing PA pressure.  3. Left atrial size was severely dilated.  4. Right atrial size was severely dilated.  5. The mitral valve is degenerative. Mild to moderate mitral valve regurgitation. Moderate mitral stenosis. The mean mitral valve gradient is 8.0 mmHg with average heart rate of 102 bpm. Severe mitral annular calcification. MVA 1.5 cm^2 by continuity equation  6. The aortic valve is calcified. There is moderate calcification of the aortic valve. Aortic valve regurgitation is trivial. Moderate aortic valve stenosis. Mild AS by gradients (Vmax 2.1 m/s, MG 23mHg), moderate by AVA (1.2 cm^2) and DI (0.4). Suspect low flow low gradient moderate AS  7. The inferior vena cava is dilated in size with <50% respiratory variability, suggesting right atrial pressure of 15 mmHg. FINDINGS  Left Ventricle: Left ventricular ejection fraction, by estimation, is 30 to 35%. The left ventricle has moderately decreased function. The left ventricle demonstrates global hypokinesis. The left ventricular internal cavity size was small. There is moderate left ventricular hypertrophy. Left ventricular diastolic parameters are indeterminate. Right Ventricle: The right ventricular size is mildly enlarged. Right vetricular wall thickness was not well visualized. Right  ventricular systolic function is moderately reduced. Tricuspid regurgitation signal is inadequate for assessing PA pressure. Left Atrium: Left atrial size was severely dilated. Right Atrium: Right atrial size was severely dilated. Pericardium: There is no evidence of pericardial effusion. Mitral Valve: The mitral valve is degenerative in appearance. Severe mitral annular calcification. Mild to moderate mitral valve regurgitation. Moderate mitral valve stenosis. MV peak gradient, 15.4 mmHg. The mean mitral valve gradient is 8.0 mmHg with average heart rate of 102 bpm. Tricuspid Valve: The tricuspid valve is normal in structure. Tricuspid valve regurgitation is trivial. Aortic Valve: The aortic valve is calcified. There is moderate calcification of the aortic valve. Aortic valve regurgitation is trivial. Moderate aortic  stenosis is present. Aortic valve mean gradient measures 10.0 mmHg. Aortic valve peak gradient measures 18.0 mmHg. Aortic valve area, by VTI measures 1.24 cm. Pulmonic Valve: The pulmonic valve was not well visualized. Pulmonic valve regurgitation is trivial. Aorta: The aortic root is normal in size and structure. Venous: The inferior vena cava is dilated in size with less than 50% respiratory variability, suggesting right atrial pressure of 15 mmHg. IAS/Shunts: The interatrial septum was not well visualized.  LEFT VENTRICLE PLAX 2D LVIDd:         3.40 cm LVIDs:         3.00 cm LV PW:         1.30 cm LV IVS:        1.50 cm LVOT diam:     2.00 cm LV SV:         43 LV SV Index:   22 LVOT Area:     3.14 cm  RIGHT VENTRICLE            IVC RV Basal diam:  4.30 cm    IVC diam: 2.30 cm RV Mid diam:    4.00 cm RV S prime:     8.27 cm/s TAPSE (M-mode): 1.0 cm LEFT ATRIUM              Index        RIGHT ATRIUM           Index LA diam:        3.90 cm  2.00 cm/m   RA Area:     29.20 cm LA Vol (A2C):   104.0 ml 53.36 ml/m  RA Volume:   117.00 ml 60.03 ml/m LA Vol (A4C):   91.4 ml  46.89 ml/m LA Biplane Vol:  100.0 ml 51.31 ml/m  AORTIC VALVE AV Area (Vmax):    1.22 cm AV Area (Vmean):   1.28 cm AV Area (VTI):     1.24 cm AV Vmax:           212.00 cm/s AV Vmean:          143.000 cm/s AV VTI:            0.350 m AV Peak Grad:      18.0 mmHg AV Mean Grad:      10.0 mmHg LVOT Vmax:         82.30 cm/s LVOT Vmean:        58.100 cm/s LVOT VTI:          0.138 m LVOT/AV VTI ratio: 0.39  AORTA Ao Root diam: 3.10 cm MITRAL VALVE MV Area VTI:  1.54 cm        SHUNTS MV Peak grad: 15.4 mmHg       Systemic VTI:  0.14 m MV Mean grad: 8.0 mmHg        Systemic Diam: 2.00 cm MV Vmax:      1.96 m/s MV Vmean:     130.0 cm/s MR Peak grad:    104.0 mmHg MR Mean grad:    63.0 mmHg MR Vmax:         510.00 cm/s MR Vmean:        370.0 cm/s MR PISA:         1.57 cm MR PISA Eff ROA: 12 mm MR PISA Radius:  0.50 cm Oswaldo Milian MD Electronically signed by Oswaldo Milian MD Signature Date/Time: 04/30/2022/1:54:53 PM    Final    CT Angio Chest PE W/Cm &/Or Wo Cm  Result Date: 04/29/2022 CLINICAL DATA:  Suspected pulmonary embolism. EXAM: CT ANGIOGRAPHY CHEST WITH CONTRAST TECHNIQUE: Multidetector CT imaging of the chest was performed using the standard protocol during bolus administration of intravenous contrast. Multiplanar CT image reconstructions and MIPs were obtained to evaluate the vascular anatomy. RADIATION DOSE REDUCTION: This exam was performed according to the departmental dose-optimization program which includes automated exposure control, adjustment of the mA and/or kV according to patient size and/or use of iterative reconstruction technique. CONTRAST:  10m OMNIPAQUE IOHEXOL 350 MG/ML SOLN COMPARISON:  February 27, 2020 FINDINGS: Cardiovascular: There is marked severity calcification of the thoracic aorta. The ascending thoracic aorta measures 4.3 cm in diameter. Marked severity intraluminal low attenuation is seen within the mid and distal portions of the right pulmonary artery involvement of proximal right middle  lobe and right lower lobe branches is seen. There is mild cardiomegaly with marked severity coronary artery calcification. Mild right heart strain is seen (RV/LV ratio of 1.14). No pericardial effusion. Mediastinum/Nodes: No enlarged mediastinal, hilar, or axillary lymph nodes. Thyroid gland, trachea, and esophagus demonstrate no significant findings. Lungs/Pleura: Mild left basilar and moderate severity right basilar atelectasis is seen. Small to moderate size bilateral pleural effusions are noted, right slightly greater than left. No pneumothorax is identified. Upper Abdomen: Multiple surgical clips are seen within the gallbladder fossa. Noninflamed diverticula are seen within the visualized portion of the splenic flexure. Musculoskeletal: Multilevel degenerative changes are seen throughout the thoracic spine. Review of the MIP images confirms the above findings. IMPRESSION: 1. Marked severity right-sided pulmonary embolism with mild right heart strain (RV/LV ratio of 1.14). 2. Mild left basilar and moderate severity right basilar atelectasis. 3. Small to moderate size bilateral pleural effusions, right slightly greater than left. 4. Colonic diverticulosis. 5. Evidence of prior cholecystectomy. 6. Aortic atherosclerosis. Aortic Atherosclerosis (ICD10-I70.0). Electronically Signed   By: TVirgina NorfolkM.D.   On: 04/29/2022 17:43    Cardiac Studies   Echo 04/30/22  1. Left ventricular ejection fraction, by estimation, is 30 to 35%. The  left ventricle has moderately decreased function. The left ventricle  demonstrates global hypokinesis. There is moderate left ventricular  hypertrophy. Left ventricular diastolic  parameters are indeterminate.   2. Right ventricular systolic function is moderately reduced. The right  ventricular size is mildly enlarged. Tricuspid regurgitation signal is  inadequate for assessing PA pressure.   3. Left atrial size was severely dilated.   4. Right atrial size was  severely dilated.   5. The mitral valve is degenerative. Mild to moderate mitral valve  regurgitation. Moderate mitral stenosis. The mean mitral valve gradient is  8.0 mmHg with average heart rate of 102 bpm. Severe mitral annular  calcification. MVA 1.5 cm^2 by continuity  equation   6. The aortic valve is calcified. There is moderate calcification of the  aortic valve. Aortic valve regurgitation is trivial. Moderate aortic valve  stenosis. Mild AS by gradients (Vmax 2.1 m/s, MG 160mg), moderate by AVA  (1.2 cm^2) and DI (0.4).  Suspect low flow low gradient moderate AS   7. The inferior vena cava is dilated in size with <50% respiratory  variability, suggesting right atrial pressure of 15 mmHg.   Patient Profile     [age].o. female with PMH atrial fibrillation, CVA, remote PE presenting with bilateral LE edema and shortness of breath. Found to have submassive PE and new biventricular heart failure.  Assessment & Plan    Paroxysmal atrial fibrillation, presenting with RVR History of CVA (per neuro,  felt to be small vessel disease) -CHA2DS2/VAS Stroke Risk Points=7 -she is well known to me; we have had extensive discussions regarding recommended medications, especially anticoagulation. She has understood the risks and chosen not to use anticoagulation in the past. Now with submassive PE as another indication for anticoagulation, see below. Counseled that I recommend lifelong anticoagulation given recurrent PE and high chadsvasc score. Previously discussed potential of Watchman with Laurann Montana on 04/15/21, but given recurrent PE would prefer lifelong anticoagulation. This is overwhelming for her today. I recommended starting with focusing on just a few months of anticoagulation at a time. -currently on heparin. She declines cath (see below), ok to switch to DOAC from cards perspective -at her visit with Laurann Montana 1 year ago, she endorsed taking Xarelto. However, she stopped this and  instead was taking a nattokinase supplement for at least several months prior to admission.   Acute systolic and diastolic heart failure Biventricular failure Chronic kidney disease stage 3a Valvular heart disease -limited echo shows LVEF 30-35%, RV with moderately reduced function, severe biatrial enlargement, mild-moderate MR, moderate MS (at HR 102 bpm), moderate AS -prior echo 12/2021 with preserved EF -Blood pressure is low, limits guideline directed medical therapy -admission weight 85.4 kg, no weight yet today. Charted net negative 600 ml, though she reports >3L out -we discussed ischemic evaluation. She does not want cath, does not want stents or bypass surgery. She would like to work on medical management first -does not tolerate metoprolol. No BP room for ARB/ARNI. Did discuss SLGT2i -will start with low dose bisoprolol. Titrate as able. Then would add SGLT2i next  Submassive PE -recurrent, with prior PE >15 years ago in the setting of travel. She does note minimal activity over the last month due to pain from prior fall and lumbar fracture -elevated troponins 2/2 strain from PE vs acute heart failure  Coronary calcification Hypercholesterolemia Aortic atherosclerosis -declines statins -was on ezetimibe prior to admission -LDL goal <70  Time Spent with Patient: I have spent a total of 70 minutes in patient care, including reviewing hospital notes, telemetry, EKGs, labs; discussing with the team; examining the patient; and establishing an assessment and plan that was discussed with the patient.  > 50% of time was spent in direct patient care.    For questions or updates, please contact [place] Please consult [email or web address] for contact info under        Signed, Buford Dresser, MD  05/01/2022, 10:23 AM

## 2022-05-01 NOTE — Evaluation (Signed)
Occupational Therapy Evaluation Patient Details Name: Cynthia Cook MRN: [id number] DOB: 02/02/1956 Today's Date: 05/01/2022   History of Present Illness Pt is a 67 y/o female presenting on 1/20 with decreased sensation in [place], SOB. Pt with recent fall 03/23/22 with compression fx, 03/30/22 RSV. Found to be in afib RVR. Chest xray with bilateral pleural effusions, CT angiogram with R sided PE with mild R sided heart strain. PMH includes: afib, PE, CVA, subdural hematoma, chronic back pain, gout, syncope.   Clinical Impression   PTA patient independent with ADLS, mobility using quad cane but reports progressive decline and having more difficulty caring for herself since 12/23 with fall and RSV.  She reports having meals delivered by friends and not being able to shower at home.  She was admitted for above and presents with problem list below.  She demonstrates decreased activity tolerance, impaired balance, pain and generalized weakness during ADLs.   She requires min guard for step pivot from BSC to EOB, up to mod assist for ADLs.  Educated on back precautions for comfort, but poor adherence when educated on how to log roll. HR fluctuating during session, up to 158 max. Donned 1L, as pt on RA, to maintain SpO2 >90%. Believe she will best benefit from continued OT services acutely and after dc at SNF level to optimize independence, tolerance, safety before return back home alone.      Recommendations for follow up therapy are one component of a multi-disciplinary discharge planning process, led by the attending physician.  Recommendations may be updated based on patient status, additional functional criteria and insurance authorization.   Follow Up Recommendations  Skilled nursing-short term rehab (<3 hours/day)     Assistance Recommended at Discharge Intermittent Supervision/Assistance  Patient can return home with the following A little help with walking and/or transfers;A lot of help with  bathing/dressing/bathroom;Assistance with cooking/housework;Assist for transportation;Help with stairs or ramp for entrance    Functional Status Assessment  Patient has had a recent decline in their functional status and demonstrates the ability to make significant improvements in function in a reasonable and predictable amount of time.  Equipment Recommendations  BSC/3in1    Recommendations for Other Services       Precautions / Restrictions Precautions Precautions: Fall;Back Precaution Booklet Issued: No Precaution Comments: discussed back precautions for comfort due to recent fall 12/14 with compression fx Restrictions Weight Bearing Restrictions: No      Mobility Bed Mobility Overal bed mobility: Needs Assistance Bed Mobility: Sit to Supine       Sit to supine: Min guard   General bed mobility comments: educated on log roll technique, pt declines and voices "no that would hurt too much to roll"; returned to supine from long sitting with min guard for safety    Transfers Overall transfer level: Needs assistance Equipment used: Quad cane Transfers: Sit to/from Stand, Bed to chair/wheelchair/BSC Sit to Stand: Min guard     Step pivot transfers: Min guard     General transfer comment: seated on BSC, min guard to stand and step to EOB      Balance Overall balance assessment: Needs assistance Sitting-balance support: No upper extremity supported, Feet supported Sitting balance-Leahy Scale: Good     Standing balance support: Bilateral upper extremity supported, During functional activity Standing balance-Leahy Scale: Poor Standing balance comment: relies on UE support  ADL either performed or assessed with clinical judgement   ADL Overall ADL's : Needs assistance/impaired     Grooming: Set up;Sitting           Upper Body Dressing : Set up;Sitting   Lower Body Dressing: Moderate assistance;Sit to/from stand Lower  Body Dressing Details (indicate cue type and reason): assist for socks, min guard in standing Toilet Transfer: Min guard;Stand-pivot;BSC/3in1 (cane)   Toileting- Clothing Manipulation and Hygiene: Minimal assistance;Sit to/from stand;Sitting/lateral lean       Functional mobility during ADLs: Min guard;Cane;Cueing for safety       Vision   Vision Assessment?: No apparent visual deficits     Perception     Praxis      Pertinent Vitals/Pain Pain Assessment Pain Assessment: Faces Faces Pain Scale: Hurts even more Pain Location: coccyx Pain Descriptors / Indicators: Discomfort Pain Intervention(s): Monitored during session, Limited activity within patient's tolerance, Repositioned     Hand Dominance Right   Extremity/Trunk Assessment Upper Extremity Assessment Upper Extremity Assessment: Generalized weakness   Lower Extremity Assessment Lower Extremity Assessment: Defer to PT evaluation   Cervical / Trunk Assessment Cervical / Trunk Assessment: Other exceptions Cervical / Trunk Exceptions: recent compression fx   Communication Communication Communication: No difficulties (hx of CVA with speech changes but appears WFL)   Cognition Arousal/Alertness: Awake/alert Behavior During Therapy: WFL for tasks assessed/performed Overall Cognitive Status: Within Functional Limits for tasks assessed                                 General Comments: appears WFL for tasks assessed, good awareness to deficits and aware she needs more help at home     General Comments  HR up to 158 during activity, O2 flucutating during session- donned at 1L to maintain >90%    Exercises     Shoulder Instructions      Home Living Family/patient expects to be discharged to:: Private residence Living Arrangements: Alone Available Help at Discharge:  (none) Type of Home: Apartment Home Access: Level entry     Home Layout: One level     Bathroom Shower/Tub: Animal nutritionist: Handicapped height     Home Equipment: Cane - quad;Grab bars - tub/shower;Grab bars - toilet          Prior Functioning/Environment Prior Level of Function : Independent/Modified Independent             Mobility Comments: using quad cane for mobility, reports progressively more difficulty since fall and RSV/PNA ADLs Comments: reports not driving recently, independent basic ADLs but unable to shower since fall        OT Problem List: Decreased strength;Decreased activity tolerance;Impaired balance (sitting and/or standing);Pain;Obesity;Cardiopulmonary status limiting activity;Decreased knowledge of precautions;Decreased knowledge of use of DME or AE      OT Treatment/Interventions: Self-care/ADL training;Therapeutic exercise;DME and/or AE instruction;Therapeutic activities;Patient/family education;Balance training;Energy conservation    OT Goals(Current goals can be found in the care plan section) Acute Rehab OT Goals Patient Stated Goal: get better OT Goal Formulation: With patient Time For Goal Achievement: 05/15/22 Potential to Achieve Goals: Good  OT Frequency: Min 2X/week    Co-evaluation              AM-PAC OT "6 Clicks" Daily Activity     Outcome Measure Help from another person eating meals?: None Help from another person taking care of personal grooming?: A Little Help from another  person toileting, which includes using toliet, bedpan, or urinal?: A Little Help from another person bathing (including washing, rinsing, drying)?: A Lot Help from another person to put on and taking off regular upper body clothing?: A Little Help from another person to put on and taking off regular lower body clothing?: A Lot 6 Click Score: 17   End of Session Equipment Utilized During Treatment: Gait belt;Oxygen Nurse Communication: Mobility status  Activity Tolerance: Patient limited by pain;Patient limited by fatigue Patient left: in bed;with call  bell/phone within reach;with bed alarm set  OT Visit Diagnosis: Other abnormalities of gait and mobility (R26.89);Muscle weakness (generalized) (M62.81);Pain;History of falling (Z91.81) Pain - part of body:  (back)                Time: 1233-1300 OT Time Calculation (min): 27 min Charges:  OT General Charges $OT Visit: 1 Visit OT Evaluation $OT Eval Moderate Complexity: 1 Mod OT Treatments $Self Care/Home Management : 8-22 mins  Jolaine Artist, OT Acute Rehabilitation Services Office [phone]   Delight Stare 05/01/2022, 1:51 PM

## 2022-05-01 NOTE — TOC Progression Note (Signed)
Transition of Care (TOC) - Progression Note  Marvetta Gibbons RN, BSN Transitions of Care Unit 4E- RN Case Manager See Treatment Team for direct phone #   Patient Details  Name: Cynthia Cook MRN: [id number] Date of Birth: March 01, 1956  Transition of Care [facility]) CM/SW Contact  Dahlia Client Romeo Rabon, RN Phone Number: 05/01/2022, 8:57 PM  Clinical Narrative:    Referral received for transition need, pt has questions about services available.  CM spoke with pt at bedside- pt lives alone and per pt does not have any family or close friends to assist her. Per pt she will need assistance with transportation home and does not feel she will be able to get from car to home- feels she will need EMS transport.  Also discussed HH- pt was set up with [facility] on last discharge - per pt they have not been out to her home yet to start services. Pt would still like to have [facility] services with [facility] on discharge. Discussed with pt that [facility] orders would have to be resumed on discharge and then CM would contact [facility] for continued services- RN/PT/OT/aide/SW.  Pt voiced understanding.  Pt also requesting Shower chair and rolling walker for home- discussed DME provider and pt voiced she does not have a preference agreeable to anyone that will deliver to her home and assist with any set up.   Pt has been started on Eliquis- per benefits check- copay $47- this also discussed with pt who voiced she is ok with cost.   PT/OT evals pending and TOC will follow for recommendations,     Expected Discharge Plan: [place] Barriers to Discharge: Continued Medical Work up  Expected Discharge Plan and Services In-house Referral: Clinical Social Work Discharge Planning Services: CM Consult Post Acute Care Choice: Durable Medical Equipment Living arrangements for the past 2 months: Apartment                           HH Arranged: PT, OT, Nurse's Aide, Social Work [employer] Agency: [employer]         Social Determinants of Health (SDOH) Interventions SDOH Screenings   Food Insecurity: No Food Insecurity (03/15/2020)  Transportation Needs: No Transportation Needs (06/14/2021)  Depression (PHQ2-9): Medium Risk (02/18/2021)  Financial Resource Strain: High Risk (06/14/2021)  Stress: Stress Concern Present (06/14/2021)  Tobacco Use: Low Risk  (03/30/2022)    Readmission Risk Interventions     No data to display

## 2022-05-01 NOTE — Progress Notes (Signed)
Heart Failure Navigator Progress Note  Following this hospitalization to assess for HV TOC readiness.   EF 30-35% Per MD note , patient declined heart cath, is currently unsure about taking GDMT medication. Patient has declined since her fall in December 2023, resulting in a Lumbar fracture, painful to walk straight or even stand.  PT consulted for evaluation.  Earnestine Leys, BSN, Clinical cytogeneticist Only

## 2022-05-01 NOTE — TOC Benefit Eligibility Note (Signed)
Patient Teacher, English as a foreign language completed.    The patient is currently admitted and upon discharge could be taking Eliquis Starter Pack.  The current 30 day co-pay is $47.00.   The patient is insured through [place], [place] Patient Advocate Specialist [place] Patient Advocate Team Direct Number: [phone]  Fax: [phone]

## 2022-05-01 NOTE — Evaluation (Signed)
Physical Therapy Evaluation Patient Details Name: Cynthia Cook MRN: [id number] DOB: 10-02-1955 Today's Date: 05/01/2022  History of Present Illness  Pt is a 67 y/o female presenting on 1/20 with decreased sensation in [place], SOB. Pt with recent fall 03/23/22 with compression fx, 03/30/22 RSV. Found to be in afib RVR. Chest xray with bilateral pleural effusions, CT angiogram with R sided PE with mild R sided heart strain. PMH includes: afib, PE, CVA, subdural hematoma, chronic back pain, gout, syncope.  Clinical Impression  Patient presents with decreased mobility due to pain, limited activity tolerance, decreased balance, decreased strength and decreased cardiorespiratory endurance.  Today with mobility to EOB with minguard A. and mod A for up to BSC HR up to 151.  She previously had been living alone and managing well prior to her fall last month when she had compression fracture, then developed RSV.  She relates she could manage to eat the food delivered by her friends then placed dishes in to soak then after couple hours would rinse them, then after couple hours put them away.  She could not shower and feels she was not managing well.  However, she hopes to return home with 2 hours help a day she states someone told her she could get.  Feel she would be best served in STSNF prior to d/c home.  PT will continue to follow.      Recommendations for follow up therapy are one component of a multi-disciplinary discharge planning process, led by the attending physician.  Recommendations may be updated based on patient status, additional functional criteria and insurance authorization.  Follow Up Recommendations Skilled nursing-short term rehab (<3 hours/day) Can patient physically be transported by private vehicle: No    Assistance Recommended at Discharge Frequent or constant Supervision/Assistance  Patient can return home with the following  A little help with walking and/or transfers;Assistance with  cooking/housework;Assist for transportation;A little help with bathing/dressing/bathroom;Help with stairs or ramp for entrance    Equipment Recommendations Wheelchair (measurements PT);Wheelchair cushion (measurements PT)  Recommendations for Other Services       Functional Status Assessment Patient has had a recent decline in their functional status and demonstrates the ability to make significant improvements in function in a reasonable and predictable amount of time.     Precautions / Restrictions Precautions Precautions: Fall;Back Precaution Booklet Issued: No Precaution Comments: discussed back precautions for comfort due to recent fall 12/14 with compression fx Restrictions Weight Bearing Restrictions: No      Mobility  Bed Mobility Overal bed mobility: Needs Assistance Bed Mobility: Supine to Sit     Supine to sit: HOB elevated, Min guard     General bed mobility comments: heavy UE use and increased time, kept HOB up since pt sleeps in her lift chair at home    Transfers Overall transfer level: Needs assistance   Transfers: Sit to/from Stand, Bed to chair/wheelchair/BSC Sit to Stand: Mod assist   Step pivot transfers: Min assist       General transfer comment: pt pulling up on PT's arm with some lifting help to stand and A for balance to step to [facility]    Ambulation/Gait               General Gait Details: NT due to pain, HR in 150's with stepping to [facility]  Stairs            Wheelchair Mobility    Modified Rankin (Stroke Patients Only)       Balance Overall  balance assessment: Needs assistance Sitting-balance support: No upper extremity supported, Feet supported Sitting balance-Leahy Scale: Good     Standing balance support: Bilateral upper extremity supported, During functional activity Standing balance-Leahy Scale: Poor Standing balance comment: relies on UE support                             Pertinent Vitals/Pain Pain  Assessment Pain Assessment: Faces Faces Pain Scale: Hurts whole lot Pain Location: back with sit to stand and pivot to [facility] Pain Descriptors / Indicators: Moaning, Grimacing, Guarding Pain Intervention(s): Monitored during session, Repositioned, Limited activity within patient's tolerance    Home Living Family/patient expects to be discharged to:: Private residence Living Arrangements: Alone Available Help at Discharge:  (none) Type of Home: Apartment Home Access: Level entry       Home Layout: One level Home Equipment: Cane - quad;Grab bars - tub/shower;Grab bars - toilet      Prior Function Prior Level of Function : Independent/Modified Independent             Mobility Comments: using quad cane for mobility, reports progressively more difficulty since fall and RSV/PNA ADLs Comments: reports not driving recently, independent basic ADLs but unable to shower since fall     Hand Dominance   Dominant Hand: Right    Extremity/Trunk Assessment   Upper Extremity Assessment Upper Extremity Assessment: Defer to OT evaluation    Lower Extremity Assessment Lower Extremity Assessment: RLE deficits/detail;LLE deficits/detail RLE Deficits / Details: AROM WFL . strength 4-/5 pain in back with testing more on R than L RLE Sensation: decreased light touch (in feet since fall, back fracture) LLE Deficits / Details: AROM WFL . strength 4-/5 pain in back with testing more on R than L LLE Sensation: decreased light touch (in feet since fall/back fracture)    Cervical / Trunk Assessment Cervical / Trunk Assessment: Kyphotic Cervical / Trunk Exceptions: recent compression fx  Communication   Communication: No difficulties  Cognition Arousal/Alertness: Awake/alert Behavior During Therapy: WFL for tasks assessed/performed Overall Cognitive Status: Within Functional Limits for tasks assessed                                 General Comments: appears WFL for tasks  assessed, good awareness to deficits and aware she needs more help at home        General Comments General comments (skin integrity, edema, etc.): HR 151 with moving to BSC and SpO2 91% on RA    Exercises     Assessment/Plan    PT Assessment Patient needs continued PT services  PT Problem List Decreased strength;Decreased balance;Decreased knowledge of precautions;Decreased mobility;Decreased activity tolerance;Impaired sensation;Cardiopulmonary status limiting activity       PT Treatment Interventions DME instruction;Functional mobility training;Patient/family education;Balance training;Therapeutic activities;Gait training;Therapeutic exercise    PT Goals (Current goals can be found in the Care Plan section)  Acute Rehab PT Goals Patient Stated Goal: hoepful for home with 2 hours help a day PT Goal Formulation: With patient Time For Goal Achievement: 05/15/22 Potential to Achieve Goals: Fair    Frequency Min 3X/week     Co-evaluation               AM-PAC PT "6 Clicks" Mobility  Outcome Measure Help needed turning from your back to your side while in a flat bed without using bedrails?: A Little Help needed moving from lying  on your back to sitting on the side of a flat bed without using bedrails?: A Little Help needed moving to and from a bed to a chair (including a wheelchair)?: A Little Help needed standing up from a chair using your arms (e.g., wheelchair or bedside chair)?: A Lot Help needed to walk in hospital room?: Total Help needed climbing 3-5 steps with a railing? : Total 6 Click Score: 13    End of Session Equipment Utilized During Treatment: Gait belt;Oxygen Activity Tolerance: Patient limited by pain Patient left: Other (comment) (Left seated on BSC and handoff to OT)   PT Visit Diagnosis: History of falling (Z91.81);Other abnormalities of gait and mobility (R26.89);Difficulty in walking, not elsewhere classified (R26.2);Pain Pain - part of body:   (back)    Time: 1694-5038 PT Time Calculation (min) (ACUTE ONLY): 19 min   Charges:   PT Evaluation $PT Eval Moderate Complexity: 1 Mod          Cyndi Exzavier Ruderman, PT Acute Rehabilitation Services [phone] 05/01/2022   Reginia Naas 05/01/2022, 2:11 PM

## 2022-05-02 ENCOUNTER — Encounter (HOSPITAL_COMMUNITY): Payer: Self-pay | Admitting: Internal Medicine

## 2022-05-02 ENCOUNTER — Inpatient Hospital Stay (HOSPITAL_COMMUNITY): Payer: Medicare HMO

## 2022-05-02 DIAGNOSIS — I480 Paroxysmal atrial fibrillation: Secondary | ICD-10-CM | POA: Diagnosis not present

## 2022-05-02 DIAGNOSIS — I2609 Other pulmonary embolism with acute cor pulmonale: Secondary | ICD-10-CM | POA: Diagnosis not present

## 2022-05-02 DIAGNOSIS — I5043 Acute on chronic combined systolic (congestive) and diastolic (congestive) heart failure: Secondary | ICD-10-CM

## 2022-05-02 DIAGNOSIS — I5041 Acute combined systolic (congestive) and diastolic (congestive) heart failure: Secondary | ICD-10-CM

## 2022-05-02 DIAGNOSIS — M109 Gout, unspecified: Secondary | ICD-10-CM | POA: Diagnosis not present

## 2022-05-02 LAB — BASIC METABOLIC PANEL
Anion gap: 10 (ref 5–15)
BUN: 64 mg/dL — ABNORMAL HIGH (ref 8–23)
CO2: 33 mmol/L — ABNORMAL HIGH (ref 22–32)
Calcium: 8.7 mg/dL — ABNORMAL LOW (ref 8.9–10.3)
Chloride: 98 mmol/L (ref 98–111)
Creatinine, Ser: 1.56 mg/dL — ABNORMAL HIGH (ref 0.44–1.00)
GFR, Estimated: 36 mL/min — ABNORMAL LOW (ref >60)
Glucose, Bld: 111 mg/dL — ABNORMAL HIGH (ref 70–99)
Potassium: 4.5 mmol/L (ref 3.5–5.1)
Sodium: 141 mmol/L (ref 135–145)

## 2022-05-02 MED ORDER — PREDNISONE 20 MG PO TABS
50.0000 mg | ORAL_TABLET | Freq: Every day | ORAL | Status: DC
  Administered 2022-05-02 – 2022-05-05 (×4): 50 mg via ORAL
  Filled 2022-05-02 (×4): qty 1

## 2022-05-02 MED ORDER — BISOPROLOL FUMARATE 5 MG PO TABS
5.0000 mg | ORAL_TABLET | Freq: Every day | ORAL | Status: DC
  Administered 2022-05-03 – 2022-05-05 (×3): 5 mg via ORAL
  Filled 2022-05-02 (×3): qty 1

## 2022-05-02 MED ORDER — BISOPROLOL FUMARATE 5 MG PO TABS
2.5000 mg | ORAL_TABLET | Freq: Once | ORAL | Status: AC
  Administered 2022-05-02: 2.5 mg via ORAL
  Filled 2022-05-02: qty 1

## 2022-05-02 NOTE — Progress Notes (Signed)
RN informed about the benefit of MRI to the pt. Pt verbalized understanding but need to think about it overnight. MD notified.   Lavenia Atlas, RN

## 2022-05-02 NOTE — Discharge Instructions (Signed)

## 2022-05-02 NOTE — Progress Notes (Signed)
   Heart Failure Stewardship Pharmacist Progress Note   PCP: Lurline Del, DO PCP-Cardiologist: Buford Dresser, MD    HPI:  67 yo F with PMH of afib, PE, CVA, SDH, hypothyroidism, and chronic back pain.   She presented to the ED on 1/20 with LE weakness, edema, and shortness of breath. Recently had a fall and found to have compression fracture of lumbar spine, then had RSV and PNA. Has had difficulty caring for herself. CXR with small bilateral pleural effusion. CTA with large right sided PE with mild right heart strain. ECHO 1/21 with LVEF 30-35% (was 55-60% 12/2021), global hypokinesis, RV moderately reduced.   Current HF Medications: Beta Blocker: bisoprolol 5 mg daily SGLT2i: Jardiance 10 mg daily  Prior to admission HF Medications: None  Pertinent Lab Values: Serum creatinine 1.45, BUN 66, Potassium 3.8, Sodium 139, BNP 580.5, Magnesium 2.1  Vital Signs: Weight: 194 lbs (admission weight: 188 lbs) Blood pressure: 120/80s  Heart rate: 80-100s  I/O: -1.2L yesterday; net -3.4L  Medication Assistance / Insurance Benefits Check: Does the patient have prescription insurance?  Yes Type of insurance plan: [place] [employer]  Outpatient Pharmacy:  Prior to admission outpatient [facility] Is the patient willing to use [facility] at discharge? Yes Is the patient willing to transition their outpatient pharmacy to utilize a [facility] outpatient pharmacy?   Pending    Assessment: 1. Acute on chronic systolic CHF (LVEF 00-93%). NYHA class II symptoms. - No edema or JVD on exam. Off IV lasix. Need to be careful with diuresing with right heart strain. Strict I/Os and daily weights. Keep K>4 and Mg>2. - Continue bisoprolol 5 mg daily. Did not tolerate metoprolol in the past. - BP too soft for ARB/MRA at this time. She is at increased fall risk. - Agree with adding Jardiance 10 mg daily   Plan: 1) Medication changes recommended at this time: - Agree with changes   2)  Patient assistance: - Jardiance copay $47 - May go to SNF rehab at discharge  3)  Education  - To be completed prior to discharge  Kerby Nora, PharmD, BCPS Heart Failure Cytogeneticist Phone [phone]

## 2022-05-02 NOTE — NC FL2 (Signed)
[place] LEVEL OF CARE FORM     IDENTIFICATION  Patient Name: Cynthia Cook Birthdate: [birth year] Sex: female Admission Date (Current Location): 04/29/2022  [facility] and [place] Number:  Herbalist and Address:  [facility], [place] [address], [place], [place] [address]      Provider Number: [id number]  Attending Physician Name and Address:  Gwynne Edinger, MD  Relative Name and Phone Number:       Current Level of Care: Hospital Recommended Level of Care: [name] Prior Approval Number:    Date Approved/Denied:   PASRR Number: [id number]  Discharge Plan: SNF    Current Diagnoses: Patient Active Problem List   Diagnosis Date Noted   Acute combined systolic and diastolic heart failure ([place]) 05/02/2022   Paroxysmal atrial fibrillation with RVR ([place]) 05/02/2022   Gout flare 05/02/2022   CHF (congestive heart failure) ([place]) 04/29/2022   History of CVA (cerebrovascular accident) 04/29/2022   History of pulmonary embolism 04/29/2022   Lumbar compression fracture ([place]) 04/29/2022   Normocytic anemia 04/29/2022   Excessive daytime sleepiness 02/10/2022   Elevated blood pressure reading 02/10/2022   Subdural hematoma (HCC)    CVA (cerebral vascular accident) ([place]) 12/30/2021   Elevated blood pressure reading with diagnosis of hypertension 10/26/2021   Urine output low 10/26/2021   Reactive depression (situational) 10/26/2021   Eczema intertrigo 06/28/2021   Varicose veins of both lower extremities 06/28/2021   Financial insecurity 06/28/2021   Encounter for medical examination to establish care 02/17/2021   Stroke ([place]) 01/04/2021   Acute CVA (cerebrovascular accident) ([place]) 01/03/2021   CKD (chronic kidney disease) stage 3, GFR 30-59 ml/min (HCC) - baseline SCr 1.3 01/03/2021   Hashimoto's thyroiditis 06/29/2020   Elevated troponin I level    Atrial fibrillation ([place]) 02/17/2020   Hypothyroidism  02/06/2020   Pulmonary embolism ([place]) 02/06/2020    Orientation RESPIRATION BLADDER Height & Weight     Self, Time, Situation, Place  Normal (please see discharge summary) External catheter, Continent Weight: 195 lb 12.3 oz (88.8 kg) Height:  '5\' 6"'$  (167.6 cm)  BEHAVIORAL SYMPTOMS/MOOD NEUROLOGICAL BOWEL NUTRITION STATUS      Continent Diet  AMBULATORY STATUS COMMUNICATION OF NEEDS Skin   Limited Assist Verbally Other (Comment) (Ecchymosis)                       Personal Care Assistance Level of Assistance  Bathing, Feeding, Dressing Bathing Assistance: Limited assistance Feeding assistance: Independent Dressing Assistance: Limited assistance     Functional Limitations Info  Sight, Hearing, Speech Sight Info: Adequate Hearing Info: Adequate Speech Info: Adequate    SPECIAL CARE FACTORS FREQUENCY  PT (By licensed PT), OT (By licensed OT)     PT Frequency: 5x per week OT Frequency: 5x per week            Contractures Contractures Info: Not present    Additional Factors Info  Code Status, Allergies Code Status Info: FULL Allergies Info: Codeine,Duragestic,mucinex, nsaids           Current Medications (05/02/2022):  This is the current hospital active medication list Current Facility-Administered Medications  Medication Dose Route Frequency Provider Last Rate Last Admin   acetaminophen (TYLENOL) tablet 650 mg  650 mg Oral Q6H PRN Dorado Plan A, MD       Or   acetaminophen (TYLENOL) suppository 650 mg  650 mg Rectal Q6H PRN Norval Morton, MD  albuterol (PROVENTIL) (2.5 MG/3ML) 0.083% nebulizer solution 2.5 mg  2.5 mg Nebulization Q6H PRN Huyett Plan A, MD       apixaban (ELIQUIS) tablet 10 mg  10 mg Oral BID Gwynne Edinger, MD   10 mg at 05/02/22 1610   Followed by   Derrill Memo ON 05/08/2022] apixaban (ELIQUIS) tablet 5 mg  5 mg Oral BID Wouk, Ailene Rud, MD       Derrill Memo ON 05/03/2022] bisoprolol (ZEBETA) tablet 5 mg  5 mg Oral Daily  Buford Dresser, MD       furosemide (LASIX) injection 40 mg  40 mg Intravenous BID Blunck Plan A, MD   40 mg at 05/02/22 1721   Glycerin (Adult) 2 g suppository 1 suppository  1 suppository Rectal Daily PRN Gwynne Edinger, MD   1 suppository at 05/02/22 1441   levothyroxine (SYNTHROID) tablet 50 mcg  50 mcg Oral Q M,W,F Martus Plan A, MD   50 mcg at 05/01/22 0845   levothyroxine (SYNTHROID) tablet 75 mcg  75 mcg Oral Once per day on Sun Tue Thu Sat [place] Plan A, MD   75 mcg at 05/02/22 0624   lidocaine (LIDODERM) 5 % 1 patch  1 patch Transdermal Q24H Palmero Plan A, MD   1 patch at 04/29/22 2113   morphine (PF) 2 MG/ML injection 2 mg  2 mg Intravenous Q4H PRN [place]olten Plan A, MD   2 mg at 04/29/22 1652   ondansetron (ZOFRAN) tablet 4 mg  4 mg Oral Q6H PRN Gaughan Plan A, MD       Or   ondansetron (ZOFRAN) injection 4 mg  4 mg Intravenous Q6H PRN Porr Plan A, MD   4 mg at 04/30/22 0842   predniSONE (DELTASONE) tablet 50 mg  50 mg Oral Q breakfast Gwynne Edinger, MD   50 mg at 05/02/22 1720   senna-docusate (Senokot-S) tablet 1 tablet  1 tablet Oral QHS Gwynne Edinger, MD   1 tablet at 05/01/22 2142   sodium chloride flush (NS) 0.9 % injection 3 mL  3 mL Intravenous Q12H Smith, Rondell A, MD   3 mL at 05/01/22 2143   traMADol (ULTRAM) tablet 25 mg  25 mg Oral Q6H Wouk, Ailene Rud, MD   25 mg at 05/02/22 1728     Discharge Medications: Please see discharge summary for a list of discharge medications.  Relevant Imaging Results:  Relevant Lab Results:   Additional Information SSN [id number]  Vinie Sill, LCSW

## 2022-05-02 NOTE — Progress Notes (Addendum)
PROGRESS NOTE    Cynthia Cook  JIR:[id number] DOB: 07/24/1955 DOA: 04/29/2022 PCP: Lurline Del, DO     Brief Narrative:   From admission h and p  Cynthia Cook is a 67 y.o. female with medical history significant of paroxysmal atrial fibrillation not on anticoagulation, history of pulmonary embolism not on anticoagulation, history of CVA, subdural hematoma, hypothyroidism, and chronic back pain who presents with complaints of decreased sensation in her lower legs.  She states that she had a fall on December 14 and had been diagnosed with a compression fracture of her lumbar spine.  Ever since that time she has had paresthesias in her feet and unable to walk in a straight line or stand due to having severe pain. Patient had been diagnosed with RSV 03/30/2022.  Since that time she had a progressive decline and has not been doing well or able to care for self.  Patient states that her dog had died and so she is at home alone.  Neighbors have been bringing food to her that is not the healthiest for which she noticed that she had started to have lower extremity swelling over the last several days.  She has a history of atrial fibrillation and previously had been on Eliquis, but had not been able to afford this medication to continue it.  She reports not being able to tolerate Coumadin due to bleeding and has been taking a Nattokinase supplement to thin her blood instead.  She denies having any chest pain or palpitations, but has felt more short of breath.  Patient patient is not on any blood pressure medication and states that normally her blood pressures and heart rates are well-controlled. She had not been able to follow-up with anyone and states she is felt like she is falling through the cracks.   Review of records note that patient had been admitted with dysarthria found to have an acute stroke with what was thought to be chronic bilateral subdural hematomas back in 12/2021 where she was advised to  need to be on anticoagulation and declined at that time for which she was told that she would likely have recurrent strokes could be like threatening and disabling.     Assessment & Plan:   Principal Problem:   Pulmonary embolism (HCC) Active Problems:   CHF (congestive heart failure) (HCC)   Atrial fibrillation (HCC)   History of pulmonary embolism   Elevated troponin I level   Lumbar compression fracture (HCC)   Normocytic anemia   CKD (chronic kidney disease) stage 3, GFR 30-59 ml/min (HCC) - baseline SCr 1.3   Subdural hematoma (HCC)   History of CVA (cerebrovascular accident)   Hypothyroidism  # Pulmonary embolism # Hypoxic respiratory failure Acute, severe right-sided PE on CTA, evidence right heart strain. Hemodynamically stable and relatively asymptomatic. PVL initial read negative for DVT. Hx of PE in past, not anticoagulated at time of presentation. Risk factors include recent illness (rsv) and relative immobility 2/2 recent fall with lumbar compression fracture.  - converted from heparin to apixaban on 1/22 - weaning O2, on 1 liter currently and satting well, advised rn to discontinue and monitor  # HFrEF 2/2 PE. TTE with EF 30-35, moderately reduced RV function. Likely a consequence of PE, a-fib.  - cardiology following - continuing IV lasix - bisoprolol started 1/22  # A-fib with RVR Relatively asymtpomatic. HR improved, now 100s at rest, chads2vasc is 7. - bisoprolol as above - apixaban as above  # Lumbar compression fracture  Patient reports recent fall evaluated at [facility] with mri which showed lumbar compression fracture - pain control - outpt ortho f/u - pt/ot consulted, advising SNF, patient desires this, TOC consulted - given worsening ambulation and worsening complaint of decreased sensation I advised MRI today but patient declined  # Gout with flare Patient reports onset of typical gout pain left ankle today similar to prior flares. Says her  flares don't typically involve redness but the ankle is quite tender to touch/movement. Says prednisone tapers are what works best for her. - start prednisone 50 qd  # CKD 3b Stable - monitor  # Hx CVA - home plavix discontinued; anticoagulation as above - not on statin due to patient preference  # Hypothyroid TSH mildly elevated - cont home levothyroxine, will need outpt repeat of tsh    DVT prophylaxis: apixaban Code Status: full Family Communication: none @ bedside  Level of care: Telemetry Cardiac Status is: Inpatient Remains inpatient appropriate because: pending SNF placement    Consultants:  cardiology  Procedures: none  Antimicrobials:  none    Subjective: Breathing stable, pain stable, tolerating diet  Objective: Vitals:   05/01/22 2311 05/02/22 0334 05/02/22 0826 05/02/22 0839  BP: 125/70 116/75 104/64 105/77  Pulse: 91 96 92 (!) 104  Resp: '20 20 20 20  '$ Temp: 98.3 F (36.8 C) 98.2 F (36.8 C) 98.3 F (36.8 C) 98.3 F (36.8 C)  TempSrc: Oral Oral Oral Oral  SpO2: 100% 100% 100% 96%  Weight:  88.8 kg    Height:        Intake/Output Summary (Last 24 hours) at 05/02/2022 0935 Last data filed at 05/02/2022 0336 Gross per 24 hour  Intake 720 ml  Output 2100 ml  Net -1380 ml   Filed Weights   04/30/22 0408 05/02/22 0334  Weight: 85.4 kg 88.8 kg    Examination:  General exam: NAD Respiratory system: rales right base Cardiovascular system: S1 & S2 heard, RRR. No JVD, murmurs, rubs, gallops or clicks. Gastrointestinal system: Abdomen is nondistended, soft and nontender. No organomegaly or masses felt. Normal bowel sounds heard. Central nervous system: Alert and oriented. No focal neurological deficits. Extremities: Symmetric 5 x 5 power. Skin: No rashes, lesions or ulcers. Edema to knees Psychiatry: Judgement and insight appear normal. tearful    Data Reviewed: I have personally reviewed following labs and imaging studies  CBC: Recent  Labs  Lab 04/29/22 0639 04/30/22 0124 05/01/22 0051  WBC 6.1 9.0 7.3  HGB 10.7* 11.4* 10.6*  HCT 34.9* 35.1* 32.9*  MCV 90.6 87.1 88.0  PLT 257 218 250   Basic Metabolic Panel: Recent Labs  Lab 04/29/22 0639 04/30/22 0124 04/30/22 1442 05/01/22 0051 05/02/22 0319  NA 142 139 140 139 141  K 4.4 5.2* 4.6 4.7 4.5  CL 105 104 103 101 98  CO2 '26 25 26 31 '$ 33*  GLUCOSE 96 121* 130* 122* 111*  BUN 46* 44* 45* 47* 64*  CREATININE 1.37* 1.48* 1.61* 1.57* 1.56*  CALCIUM 9.1 9.0 8.8* 8.7* 8.7*  MG  --  2.1  --   --   --    GFR: Estimated Creatinine Clearance: 38.2 mL/min (A) (by C-G formula based on SCr of 1.56 mg/dL (H)). Liver Function Tests: Recent Labs  Lab 04/29/22 0639  AST 24  ALT 23  ALKPHOS 66  BILITOT 0.7  PROT 6.8  ALBUMIN 3.0*   No results for input(s): "LIPASE", "AMYLASE" in the last 168 hours. No results for input(s): "AMMONIA" in  the last 168 hours. Coagulation Profile: No results for input(s): "INR", "PROTIME" in the last 168 hours. Cardiac Enzymes: No results for input(s): "CKTOTAL", "CKMB", "CKMBINDEX", "TROPONINI" in the last 168 hours. BNP (last 3 results) No results for input(s): "PROBNP" in the last 8760 hours. HbA1C: No results for input(s): "HGBA1C" in the last 72 hours. CBG: No results for input(s): "GLUCAP" in the last 168 hours. Lipid Profile: No results for input(s): "CHOL", "HDL", "LDLCALC", "TRIG", "CHOLHDL", "LDLDIRECT" in the last 72 hours. Thyroid Function Tests: No results for input(s): "TSH", "T4TOTAL", "FREET4", "T3FREE", "THYROIDAB" in the last 72 hours.  Anemia Panel: No results for input(s): "VITAMINB12", "FOLATE", "FERRITIN", "TIBC", "IRON", "RETICCTPCT" in the last 72 hours. Urine analysis:    Component Value Date/Time   COLORURINE YELLOW 04/29/2022 1949   APPEARANCEUR HAZY (A) 04/29/2022 1949   LABSPEC 1.015 04/29/2022 1949   PHURINE 5.0 04/29/2022 1949   GLUCOSEU NEGATIVE 04/29/2022 1949   HGBUR NEGATIVE 04/29/2022  1949   BILIRUBINUR NEGATIVE 04/29/2022 1949   [place] NEGATIVE 04/29/2022 1949   PROTEINUR NEGATIVE 04/29/2022 1949   NITRITE NEGATIVE 04/29/2022 1949   LEUKOCYTESUR SMALL (A) 04/29/2022 1949   Sepsis Labs: '@LABRCNTIP'$ (procalcitonin:4,lacticidven:4)  ) Recent Results (from the past 240 hour(s))  Culture, blood (single)     Status: None (Preliminary result)   Collection Time: 04/29/22  6:40 AM   Specimen: BLOOD LEFT ARM  Result Value Ref Range Status   Specimen Description BLOOD LEFT ARM  Final   Special Requests   Final    BOTTLES DRAWN AEROBIC AND ANAEROBIC Blood Culture adequate volume   Culture   Final    NO GROWTH 3 DAYS Performed at [facility], [place] [address] [place], [place] [address]    Report Status PENDING  Incomplete         Radiology Studies: VAS [place] LOWER EXTREMITY VENOUS (DVT)  Result Date: 04/30/2022  Lower Venous DVT Study Patient Name:  Cynthia Cook  Date of Exam:   04/30/2022 Medical Rec #: [id number]       Accession #:    [id number] Date of Birth: 28-Mar-1956        Patient Gender: F Patient Age:   44 years Exam [facility]  [facility] Procedure:      VAS [place] LOWER EXTREMITY VENOUS (DVT) Referring Phys: Martucci Plan --------------------------------------------------------------------------------  Indications: Pulmonary embolism.  Comparison Study: no prior Performing Technologist: Archie Patten RVS  Examination Guidelines: A complete evaluation includes B-mode imaging, spectral Doppler, color Doppler, and power Doppler as needed of all accessible portions of each vessel. Bilateral testing is considered an integral part of a complete examination. Limited examinations for reoccurring indications may be performed as noted. The reflux portion of the exam is performed with the patient in reverse Trendelenburg.  +--------+---------------+---------+-----------+----------+--------------------+ RIGHT    CompressibilityPhasicitySpontaneityPropertiesThrombus Aging       +--------+---------------+---------+-----------+----------+--------------------+ CFV     Full           Yes      Yes                                       +--------+---------------+---------+-----------+----------+--------------------+ SFJ     Full                                                              +--------+---------------+---------+-----------+----------+--------------------+  FV Prox Full                                                              +--------+---------------+---------+-----------+----------+--------------------+ FV Mid  Full                                                              +--------+---------------+---------+-----------+----------+--------------------+ FV      Full                                                              Distal                                                                    +--------+---------------+---------+-----------+----------+--------------------+ PFV     Full                                                              +--------+---------------+---------+-----------+----------+--------------------+ POP     Full           Yes      Yes                                       +--------+---------------+---------+-----------+----------+--------------------+ PTV     Full                                                              +--------+---------------+---------+-----------+----------+--------------------+ PERO                   Yes      Yes                  patent by color                                                           doppler              +--------+---------------+---------+-----------+----------+--------------------+   +---------+---------------+---------+-----------+----------+-------------------+ LEFT     CompressibilityPhasicitySpontaneityPropertiesThrombus Aging       +---------+---------------+---------+-----------+----------+-------------------+  CFV      Full           Yes                                               +---------+---------------+---------+-----------+----------+-------------------+ SFJ      Full                                                             +---------+---------------+---------+-----------+----------+-------------------+ FV Prox  Full                                                             +---------+---------------+---------+-----------+----------+-------------------+ FV Mid   Full                                                             +---------+---------------+---------+-----------+----------+-------------------+ FV DistalFull                                                             +---------+---------------+---------+-----------+----------+-------------------+ PFV      Full                                                             +---------+---------------+---------+-----------+----------+-------------------+ POP      Full           Yes      Yes                                      +---------+---------------+---------+-----------+----------+-------------------+ PTV      Full                                                             +---------+---------------+---------+-----------+----------+-------------------+ PERO                                                  Not well visualized +---------+---------------+---------+-----------+----------+-------------------+     Summary: RIGHT: - There is no evidence of deep vein thrombosis in the lower extremity.  -  No cystic structure found in the popliteal fossa.  LEFT: - There is no evidence of deep vein thrombosis in the lower extremity.  - A cystic structure is found in the popliteal fossa.  *See table(s) above for measurements and observations. Electronically signed by Harold Barban MD on 04/30/2022 at 2:13:52 PM.     Final    ECHOCARDIOGRAM COMPLETE  Result Date: 04/30/2022    ECHOCARDIOGRAM REPORT   Patient Name:   Cynthia Cook Date of Exam: 04/30/2022 Medical Rec #:  [id number]      Height:       66.0 in Accession #:    [id number]     Weight:       188.3 lb Date of Birth:  Aug 18, 1955       BSA:          1.949 m Patient Age:    49 years       BP:           118/81 mmHg Patient Gender: F              HR:           103 bpm. Exam Location:  Inpatient Procedure: 2D Echo, Cardiac Doppler and Color Doppler Indications:    Pulmonary embolus  History:        Patient has prior history of Echocardiogram examinations, most                 recent 12/31/2021. CHF; Arrythmias:Atrial Fibrillation. Elevated                 troponin. CKD. Hx CVA.  Sonographer:    Clayton Lefort RDCS (AE) Referring Phys: Eustaquio Boyden A SMITH IMPRESSIONS  1. Left ventricular ejection fraction, by estimation, is 30 to 35%. The left ventricle has moderately decreased function. The left ventricle demonstrates global hypokinesis. There is moderate left ventricular hypertrophy. Left ventricular diastolic parameters are indeterminate.  2. Right ventricular systolic function is moderately reduced. The right ventricular size is mildly enlarged. Tricuspid regurgitation signal is inadequate for assessing PA pressure.  3. Left atrial size was severely dilated.  4. Right atrial size was severely dilated.  5. The mitral valve is degenerative. Mild to moderate mitral valve regurgitation. Moderate mitral stenosis. The mean mitral valve gradient is 8.0 mmHg with average heart rate of 102 bpm. Severe mitral annular calcification. MVA 1.5 cm^2 by continuity equation  6. The aortic valve is calcified. There is moderate calcification of the aortic valve. Aortic valve regurgitation is trivial. Moderate aortic valve stenosis. Mild AS by gradients (Vmax 2.1 m/s, MG 67mHg), moderate by AVA (1.2 cm^2) and DI (0.4). Suspect low flow low gradient moderate AS  7. The inferior vena cava is dilated  in size with <50% respiratory variability, suggesting right atrial pressure of 15 mmHg. FINDINGS  Left Ventricle: Left ventricular ejection fraction, by estimation, is 30 to 35%. The left ventricle has moderately decreased function. The left ventricle demonstrates global hypokinesis. The left ventricular internal cavity size was small. There is moderate left ventricular hypertrophy. Left ventricular diastolic parameters are indeterminate. Right Ventricle: The right ventricular size is mildly enlarged. Right vetricular wall thickness was not well visualized. Right ventricular systolic function is moderately reduced. Tricuspid regurgitation signal is inadequate for assessing PA pressure. Left Atrium: Left atrial size was severely dilated. Right Atrium: Right atrial size was severely dilated. Pericardium: There is no evidence of pericardial effusion. Mitral Valve: The mitral valve is degenerative in appearance. Severe mitral annular calcification. Mild to  moderate mitral valve regurgitation. Moderate mitral valve stenosis. MV peak gradient, 15.4 mmHg. The mean mitral valve gradient is 8.0 mmHg with average heart rate of 102 bpm. Tricuspid Valve: The tricuspid valve is normal in structure. Tricuspid valve regurgitation is trivial. Aortic Valve: The aortic valve is calcified. There is moderate calcification of the aortic valve. Aortic valve regurgitation is trivial. Moderate aortic stenosis is present. Aortic valve mean gradient measures 10.0 mmHg. Aortic valve peak gradient measures 18.0 mmHg. Aortic valve area, by VTI measures 1.24 cm. Pulmonic Valve: The pulmonic valve was not well visualized. Pulmonic valve regurgitation is trivial. Aorta: The aortic root is normal in size and structure. Venous: The inferior vena cava is dilated in size with less than 50% respiratory variability, suggesting right atrial pressure of 15 mmHg. IAS/Shunts: The interatrial septum was not well visualized.  LEFT VENTRICLE PLAX 2D LVIDd:          3.40 cm LVIDs:         3.00 cm LV PW:         1.30 cm LV IVS:        1.50 cm LVOT diam:     2.00 cm LV SV:         43 LV SV Index:   22 LVOT Area:     3.14 cm  RIGHT VENTRICLE            IVC RV Basal diam:  4.30 cm    IVC diam: 2.30 cm RV Mid diam:    4.00 cm RV S prime:     8.27 cm/s TAPSE (M-mode): 1.0 cm LEFT ATRIUM              Index        RIGHT ATRIUM           Index LA diam:        3.90 cm  2.00 cm/m   RA Area:     29.20 cm LA Vol (A2C):   104.0 ml 53.36 ml/m  RA Volume:   117.00 ml 60.03 ml/m LA Vol (A4C):   91.4 ml  46.89 ml/m LA Biplane Vol: 100.0 ml 51.31 ml/m  AORTIC VALVE AV Area (Vmax):    1.22 cm AV Area (Vmean):   1.28 cm AV Area (VTI):     1.24 cm AV Vmax:           212.00 cm/s AV Vmean:          143.000 cm/s AV VTI:            0.350 m AV Peak Grad:      18.0 mmHg AV Mean Grad:      10.0 mmHg LVOT Vmax:         82.30 cm/s LVOT Vmean:        58.100 cm/s LVOT VTI:          0.138 m LVOT/AV VTI ratio: 0.39  AORTA Ao Root diam: 3.10 cm MITRAL VALVE MV Area VTI:  1.54 cm        SHUNTS MV Peak grad: 15.4 mmHg       Systemic VTI:  0.14 m MV Mean grad: 8.0 mmHg        Systemic Diam: 2.00 cm MV Vmax:      1.96 m/s MV Vmean:     130.0 cm/s MR Peak grad:    104.0 mmHg MR Mean grad:    63.0 mmHg MR Vmax:         510.00 cm/s  MR Vmean:        370.0 cm/s MR PISA:         1.57 cm MR PISA Eff ROA: 12 mm MR PISA Radius:  0.50 cm Oswaldo Milian MD Electronically signed by Oswaldo Milian MD Signature Date/Time: 04/30/2022/1:54:53 PM    Final         Scheduled Meds:  apixaban  10 mg Oral BID   Followed by   Derrill Memo ON 05/08/2022] apixaban  5 mg Oral BID   bisoprolol  2.5 mg Oral Daily   furosemide  40 mg Intravenous BID   levothyroxine  50 mcg Oral Q M,W,F   levothyroxine  75 mcg Oral Once per day on Sun Tue Thu Sat   lidocaine  1 patch Transdermal Q24H   senna-docusate  1 tablet Oral QHS   sodium chloride flush  3 mL Intravenous Q12H   traMADol  25 mg Oral Q6H   Continuous  Infusions:     LOS: 3 days     Desma Maxim, MD [facility]   If 7PM-7AM, please contact night-coverage [email or web address] Password [facility] 05/02/2022, 9:35 AM

## 2022-05-02 NOTE — TOC Progression Note (Signed)
Transition of Care [facility]) - Progression Note    Patient Details  Name: Sherene Plancarte MRN: [id number] Date of Birth: 04/12/1955  Transition of Care [facility]) CM/SW [place], [place] Phone Number: 05/02/2022, 2:49 PM  Clinical Narrative:     Patient unavailable at this time to complete SNF assessment.  CSW requested RN to call CSW back once the patient is available.  TOC will continue to follow   Thurmond Butts, MSW, LCSW Clinical Social Worker      Expected Discharge Plan: [place] Barriers to Discharge: Continued Medical Work up  Expected Discharge Plan and Services In-house Referral: Clinical Social Work Discharge Planning Services: CM Consult Post Acute Care Choice: Durable Medical Equipment Living arrangements for the past 2 months: Apartment                           HH Arranged: PT, OT, Nurse's Aide, Social Work [employer] Agency: [employer]         Social Determinants of Health (SDOH) Interventions SDOH Screenings   Food Insecurity: No Food Insecurity (03/15/2020)  Transportation Needs: No Transportation Needs (06/14/2021)  Depression (PHQ2-9): Medium Risk (02/18/2021)  Financial Resource Strain: High Risk (06/14/2021)  Stress: Stress Concern Present (06/14/2021)  Tobacco Use: Low Risk  (03/30/2022)    Readmission Risk Interventions     No data to display

## 2022-05-02 NOTE — TOC Initial Note (Signed)
Transition of Care [facility]) - Initial/Assessment Note    Patient Details  Name: Cynthia Cook MRN: [id number] Date of Birth: 21-Mar-1956  Transition of Care [facility]) CM/SW Contact:    Vinie Sill, LCSW Phone Number: 05/02/2022, 4:49 PM  Clinical Narrative:                  CSW received call from patient. CSW introduced self and explained role. Patient states she is aware if therapy recommendation for short term rehab at [facility]. 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  She states " it appears I have no choice". CSW acknowledge her feelings and explain, we (medical team) wants to make sure she can return home safely.  CSW explained the SNF process. She states no preferred SNF at this time. CSW informed of limited SNF options w/ [facility]. She states understanding. All questions answered.  TOC will provide bed offers once available.  TOC will continue to follow   Thurmond Butts, MSW, LCSW Clinical Social Worker      Expected Discharge Plan: Skilled Nursing Facility Barriers to Discharge: Ship broker, Continued Medical Work up, SNF Pending bed offer   Patient Goals and CMS Choice Patient states their goals for this hospitalization and ongoing recovery are:: go home and be independent   Choice offered to / list presented to : Patient      Expected Discharge Plan and Services In-house Referral: Clinical Social Work Discharge Planning Services: CM Consult Post Acute Care Choice: Durable Medical Equipment Living arrangements for the past 2 months: Apartment                           HH Arranged: PT, OT, Nurse's Aide, Social Work [employer] Agency: [employer]        Prior Living Arrangements/Services Living arrangements for the past 2 months: Apartment Lives with:: Self Patient language and need for interpreter reviewed:: Yes Do you feel safe going back to the place where you live?: No      Need for  Family Participation in Patient Care: Yes (Comment) Care giver support system in place?: Yes (comment) Current home services: DME (cane) Criminal Activity/Legal Involvement Pertinent to Current Situation/Hospitalization: No - Comment as needed  Activities of Daily Living Home Assistive Devices/Equipment: Cane (specify quad or straight) ADL Screening (condition at time of admission) Patient's cognitive ability adequate to safely complete daily activities?: Yes Is the patient deaf or have difficulty hearing?: No Does the patient have difficulty seeing, even when wearing glasses/contacts?: No Does the patient have difficulty concentrating, remembering, or making decisions?: Yes Patient able to express need for assistance with ADLs?: Yes Does the patient have difficulty dressing or bathing?: Yes Independently performs ADLs?: No Communication: Independent Dressing (OT): Needs assistance Is this a change from baseline?: Pre-admission baseline Grooming: Needs assistance Is this a change from baseline?: Pre-admission baseline Feeding: Independent Bathing: Independent Toileting: Needs assistance Is this a change from baseline?: Pre-admission baseline In/Out Bed: Needs assistance Is this a change from baseline?: Pre-admission baseline Walks in Home: Needs assistance Is this a change from baseline?: Pre-admission baseline Does the patient have difficulty walking or climbing stairs?: Yes Weakness of Legs: Both Weakness of Arms/Hands: Both  Permission Sought/Granted                  Emotional Assessment Appearance:: Appears stated age     Orientation: : Oriented to Self,  Oriented to Place, Oriented to  Time, Oriented to Situation Alcohol / Substance Use: Not Applicable Psych Involvement: No (comment)  Admission diagnosis:  CHF (congestive heart failure) (HCC) [I50.9] Atrial fibrillation, unspecified type (HCC) [I48.91] Acute congestive heart failure, unspecified heart failure  type [facility]) [I50.9] Patient Active Problem List   Diagnosis Date Noted   Acute combined systolic and diastolic heart failure ([place]) 05/02/2022   Paroxysmal atrial fibrillation with RVR ([place]) 05/02/2022   Gout flare 05/02/2022   CHF (congestive heart failure) ([place]) 04/29/2022   History of CVA (cerebrovascular accident) 04/29/2022   History of pulmonary embolism 04/29/2022   Lumbar compression fracture ([place]) 04/29/2022   Normocytic anemia 04/29/2022   Excessive daytime sleepiness 02/10/2022   Elevated blood pressure reading 02/10/2022   Subdural hematoma (HCC)    CVA (cerebral vascular accident) ([place]) 12/30/2021   Elevated blood pressure reading with diagnosis of hypertension 10/26/2021   Urine output low 10/26/2021   Reactive depression (situational) 10/26/2021   Eczema intertrigo 06/28/2021   Varicose veins of both lower extremities 06/28/2021   Financial insecurity 06/28/2021   Encounter for medical examination to establish care 02/17/2021   Stroke ([place]) 01/04/2021   Acute CVA (cerebrovascular accident) ([place]) 01/03/2021   CKD (chronic kidney disease) stage 3, GFR 30-59 ml/min (HCC) - baseline SCr 1.3 01/03/2021   Hashimoto's thyroiditis 06/29/2020   Elevated troponin I level    Atrial fibrillation ([place]) 02/17/2020   Hypothyroidism 02/06/2020   Pulmonary embolism ([place]) 02/06/2020   PCP:  Lurline Del, DO Pharmacy:   [id number] [place], [place] [id number]: [phone]:: [phone]    Social Determinants of Health (SDOH) Social History: SDOH Screenings   Food Insecurity: No Food Insecurity (03/15/2020)  Transportation Needs: No Transportation Needs (06/14/2021)  Depression (PHQ2-9): Medium Risk (02/18/2021)  Financial Resource Strain: High Risk (06/14/2021)  Stress: Stress Concern Present (06/14/2021)  Tobacco Use: Low Risk  (05/02/2022)   SDOH Interventions:     Readmission Risk Interventions      No data to display

## 2022-05-02 NOTE — Progress Notes (Signed)
Physical Therapy Treatment Patient Details Name: Cynthia Cook MRN: [id number] DOB: 02-17-1956 Today's Date: 05/02/2022   History of Present Illness Pt is a 67 y/o female presenting on 1/20 with decreased sensation in [place], SOB. Pt with recent fall 03/23/22 with compression fx, 03/30/22 RSV. Found to be in afib RVR. Chest xray with bilateral pleural effusions, CT angiogram with R sided PE with mild R sided heart strain. PMH includes: afib, PE, CVA, subdural hematoma, chronic back pain, gout, syncope.    PT Comments    Pt with slow progress and limited today by pain in lt ankle that pt feels is a gout flare. Tried walker for transfer back to bed to provide more support than pt's quad cane provides. Pt initially anxious about trying walker as she was afraid it would move on her. Explained that rolling walker gave more support than quad cane and encouraged her to at least try it.    Recommendations for follow up therapy are one component of a multi-disciplinary discharge planning process, led by the attending physician.  Recommendations may be updated based on patient status, additional functional criteria and insurance authorization.  Follow Up Recommendations  Skilled nursing-short term rehab (<3 hours/day) Can patient physically be transported by private vehicle: No   Assistance Recommended at Discharge Frequent or constant Supervision/Assistance  Patient can return home with the following A little help with walking and/or transfers;Assistance with cooking/housework;Assist for transportation;A little help with bathing/dressing/bathroom;Help with stairs or ramp for entrance   Equipment Recommendations  Rolling walker (2 wheels)    Recommendations for Other Services       Precautions / Restrictions Precautions Precautions: Fall;Back Precaution Comments: recent compression fx Restrictions Weight Bearing Restrictions: No     Mobility  Bed Mobility Overal bed mobility: Needs  Assistance Bed Mobility: Sit to Supine       Sit to supine: Min guard   General bed mobility comments: Incr time and effort with pt using UE's to bring LE's up into bed. No physical assist needed only for safety    Transfers Overall transfer level: Needs assistance Equipment used: Rolling walker (2 wheels) Transfers: Sit to/from Stand, Bed to chair/wheelchair/BSC Sit to Stand: Mod assist   Step pivot transfers: Min assist       General transfer comment: Assist to bring hips up. Chair to bed with step pivot using rolling walker. Pt with decr weight bearing on LLE due to ankle pain    Ambulation/Gait               General Gait Details: Unable due to ankle pain   Stairs             Wheelchair Mobility    Modified Rankin (Stroke Patients Only)       Balance Overall balance assessment: Needs assistance Sitting-balance support: No upper extremity supported, Feet supported Sitting balance-Leahy Scale: Good     Standing balance support: Bilateral upper extremity supported, During functional activity Standing balance-Leahy Scale: Poor Standing balance comment: walker and min guard for static standing                            Cognition Arousal/Alertness: Awake/alert Behavior During Therapy: WFL for tasks assessed/performed Overall Cognitive Status: Within Functional Limits for tasks assessed  Exercises      General Comments General comments (skin integrity, edema, etc.): VSS on RA      Pertinent Vitals/Pain Pain Assessment Pain Assessment: Faces Faces Pain Scale: Hurts whole lot Pain Location: lt ankle Pain Descriptors / Indicators: Grimacing, Guarding Pain Intervention(s): Limited activity within patient's tolerance, Repositioned, Monitored during session    Home Living                          Prior Function            PT Goals (current goals can now be  found in the care plan section) Progress towards PT goals: Progressing toward goals    Frequency    Min 3X/week      PT Plan Current plan remains appropriate    Co-evaluation              AM-PAC PT "6 Clicks" Mobility   Outcome Measure  Help needed turning from your back to your side while in a flat bed without using bedrails?: A Little Help needed moving from lying on your back to sitting on the side of a flat bed without using bedrails?: A Little Help needed moving to and from a bed to a chair (including a wheelchair)?: A Lot Help needed standing up from a chair using your arms (e.g., wheelchair or bedside chair)?: A Lot Help needed to walk in hospital room?: Total Help needed climbing 3-5 steps with a railing? : Total 6 Click Score: 12    End of Session Equipment Utilized During Treatment: Gait belt Activity Tolerance: Patient limited by pain Patient left: in bed;with call bell/phone within reach;with bed alarm set Nurse Communication: Mobility status PT Visit Diagnosis: History of falling (Z91.81);Other abnormalities of gait and mobility (R26.89);Difficulty in walking, not elsewhere classified (R26.2);Pain Pain - Right/Left: Left Pain - part of body: Ankle and joints of foot     Time: 1122-1150 PT Time Calculation (min) (ACUTE ONLY): 28 min  Charges:  $Therapeutic Activity: 23-37 mins                     [place] 05/02/2022, 3:24 PM

## 2022-05-02 NOTE — Progress Notes (Signed)
Rounding Note    Patient Name: Cynthia Cook Date of Encounter: 05/02/2022  [place] Cardiologist: Buford Dresser, MD   Subjective   Feeling somewhat better today. Asking if she should go to SNF or home with home health--told her my suggestion would be for SNF but up to her. Still making good urine, though a bit less than prior. Trialing off O2/on room air on my interview.  Inpatient Medications    Scheduled Meds:  apixaban  10 mg Oral BID   Followed by   Derrill Memo ON 05/08/2022] apixaban  5 mg Oral BID   bisoprolol  2.5 mg Oral Once   [START ON 05/03/2022] bisoprolol  5 mg Oral Daily   furosemide  40 mg Intravenous BID   levothyroxine  50 mcg Oral Q M,W,F   levothyroxine  75 mcg Oral Once per day on Sun Tue Thu Sat   lidocaine  1 patch Transdermal Q24H   senna-docusate  1 tablet Oral QHS   sodium chloride flush  3 mL Intravenous Q12H   traMADol  25 mg Oral Q6H   Continuous Infusions:   PRN Meds: acetaminophen **OR** acetaminophen, albuterol, Glycerin (Adult), morphine injection, ondansetron **OR** ondansetron (ZOFRAN) IV   Vital Signs    Vitals:   05/01/22 2311 05/02/22 0334 05/02/22 0826 05/02/22 0839  BP: 125/70 116/75 104/64 105/77  Pulse: 91 96 92 (!) 104  Resp: '20 20 20 20  '$ Temp: 98.3 F (36.8 C) 98.2 F (36.8 C) 98.3 F (36.8 C) 98.3 F (36.8 C)  TempSrc: Oral Oral Oral Oral  SpO2: 100% 100% 100% 96%  Weight:  88.8 kg    Height:        Intake/Output Summary (Last 24 hours) at 05/02/2022 0946 Last data filed at 05/02/2022 0336 Gross per 24 hour  Intake 720 ml  Output 2100 ml  Net -1380 ml      05/02/2022    3:34 AM 04/30/2022    4:08 AM 02/10/2022    1:25 PM  Last 3 Weights  Weight (lbs) 195 lb 12.3 oz 188 lb 4.4 oz 174 lb 9.6 oz  Weight (kg) 88.8 kg 85.4 kg 79.198 kg      Telemetry    Atrial fibrillation, rates 90s-110s - Personally Reviewed  ECG    No new since 04/29/22 - Personally Reviewed  Physical Exam   GEN:  Well nourished, well developed in no acute distress NECK: No JVD sitting upright CARDIAC: tachycardic, irregularly irregular rhythm, normal S1 and S2, no rubs or gallops. No murmur. VASCULAR: Radial pulses 2+ bilaterally.  RESPIRATORY:  Bilateral rales at bases ABDOMEN: Soft, non-tender, non-distended MUSCULOSKELETAL:  Moves all 4 limbs independently SKIN: Warm and dry, trivial LE edema. L anterior shin with skin scaling/firmness subcutaneously but no warmth, erythema, or drainage NEUROLOGIC:  No focal neuro deficits noted. PSYCHIATRIC:  Normal affect    Labs    High Sensitivity Troponin:   Recent Labs  Lab 04/29/22 0639 04/29/22 0849  TROPONINIHS 24* 24*     Chemistry Recent Labs  Lab 04/29/22 0639 04/30/22 0124 04/30/22 1442 05/01/22 0051 05/02/22 0319  NA 142 139 140 139 141  K 4.4 5.2* 4.6 4.7 4.5  CL 105 104 103 101 98  CO2 '26 25 26 31 '$ 33*  GLUCOSE 96 121* 130* 122* 111*  BUN 46* 44* 45* 47* 64*  CREATININE 1.37* 1.48* 1.61* 1.57* 1.56*  CALCIUM 9.1 9.0 8.8* 8.7* 8.7*  MG  --  2.1  --   --   --  PROT 6.8  --   --   --   --   ALBUMIN 3.0*  --   --   --   --   AST 24  --   --   --   --   ALT 23  --   --   --   --   ALKPHOS 66  --   --   --   --   BILITOT 0.7  --   --   --   --   GFRNONAA 42* 38* 34* 35* 36*  ANIONGAP '11 10 11 7 10    '$ Lipids No results for input(s): "CHOL", "TRIG", "HDL", "LABVLDL", "LDLCALC", "CHOLHDL" in the last 168 hours.  Hematology Recent Labs  Lab 04/29/22 0639 04/30/22 0124 05/01/22 0051  WBC 6.1 9.0 7.3  RBC 3.85* 4.03 3.74*  HGB 10.7* 11.4* 10.6*  HCT 34.9* 35.1* 32.9*  MCV 90.6 87.1 88.0  MCH 27.8 28.3 28.3  MCHC 30.7 32.5 32.2  RDW 14.9 14.8 14.8  PLT 257 218 223   Thyroid  Recent Labs  Lab 04/29/22 0849  TSH 4.812*    BNP Recent Labs  Lab 04/29/22 0639  BNP 580.5*    DDimer No results for input(s): "DDIMER" in the last 168 hours.   Radiology    VAS [place] LOWER EXTREMITY VENOUS (DVT)  Result Date:  04/30/2022  Lower Venous DVT Study Patient Name:  Cynthia Cook  Date of Exam:   04/30/2022 Medical Rec #: [id number]       Accession #:    [id number] Date of Birth: [birth year]        Patient Gender: F Patient Age:   67 years Exam Location:  [facility] Procedure:      VAS [place] LOWER EXTREMITY VENOUS (DVT) Referring Phys: Knicker[place]ocker Plan --------------------------------------------------------------------------------  Indications: Pulmonary embolism.  Comparison Study: no prior Performing Technologist: Archie Patten RVS  Examination Guidelines: A complete evaluation includes B-mode imaging, spectral Doppler, color Doppler, and power Doppler as needed of all accessible portions of each vessel. Bilateral testing is considered an integral part of a complete examination. Limited examinations for reoccurring indications may be performed as noted. The reflux portion of the exam is performed with the patient in reverse Trendelenburg.  +--------+---------------+---------+-----------+----------+--------------------+ RIGHT   CompressibilityPhasicitySpontaneityPropertiesThrombus Aging       +--------+---------------+---------+-----------+----------+--------------------+ CFV     Full           Yes      Yes                                       +--------+---------------+---------+-----------+----------+--------------------+ SFJ     Full                                                              +--------+---------------+---------+-----------+----------+--------------------+ FV Prox Full                                                              +--------+---------------+---------+-----------+----------+--------------------+ FV Mid  Full                                                              +--------+---------------+---------+-----------+----------+--------------------+  FV      Full                                                              Distal                                                                     +--------+---------------+---------+-----------+----------+--------------------+ PFV     Full                                                              +--------+---------------+---------+-----------+----------+--------------------+ POP     Full           Yes      Yes                                       +--------+---------------+---------+-----------+----------+--------------------+ PTV     Full                                                              +--------+---------------+---------+-----------+----------+--------------------+ PERO                   Yes      Yes                  patent by color                                                           doppler              +--------+---------------+---------+-----------+----------+--------------------+   +---------+---------------+---------+-----------+----------+-------------------+ LEFT     CompressibilityPhasicitySpontaneityPropertiesThrombus Aging      +---------+---------------+---------+-----------+----------+-------------------+ CFV      Full           Yes                                               +---------+---------------+---------+-----------+----------+-------------------+ SFJ      Full                                                             +---------+---------------+---------+-----------+----------+-------------------+  FV Prox  Full                                                             +---------+---------------+---------+-----------+----------+-------------------+ FV Mid   Full                                                             +---------+---------------+---------+-----------+----------+-------------------+ FV DistalFull                                                             +---------+---------------+---------+-----------+----------+-------------------+ PFV      Full                                                              +---------+---------------+---------+-----------+----------+-------------------+ POP      Full           Yes      Yes                                      +---------+---------------+---------+-----------+----------+-------------------+ PTV      Full                                                             +---------+---------------+---------+-----------+----------+-------------------+ PERO                                                  Not well visualized +---------+---------------+---------+-----------+----------+-------------------+     Summary: RIGHT: - There is no evidence of deep vein thrombosis in the lower extremity.  - No cystic structure found in the popliteal fossa.  LEFT: - There is no evidence of deep vein thrombosis in the lower extremity.  - A cystic structure is found in the popliteal fossa.  *See table(s) above for measurements and observations. Electronically signed by Harold Barban MD on 04/30/2022 at 2:13:52 PM.    Final    ECHOCARDIOGRAM COMPLETE  Result Date: 04/30/2022    ECHOCARDIOGRAM REPORT   Patient Name:   Cynthia Cook Date of Exam: 04/30/2022 Medical Rec #:  [id number]      Height:       66.0 in Accession #:    [id number]     Weight:       188.3 lb Date of Birth:  [birth year]  BSA:          1.949 m Patient Age:    25 years       BP:           118/81 mmHg Patient Gender: F              HR:           103 bpm. Exam Location:  Inpatient Procedure: 2D Echo, Cardiac Doppler and Color Doppler Indications:    Pulmonary embolus  History:        Patient has prior history of Echocardiogram examinations, most                 recent 12/31/2021. CHF; Arrythmias:Atrial Fibrillation. Elevated                 troponin. CKD. Hx CVA.  Sonographer:    Clayton Lefort RDCS (AE) Referring Phys: Eustaquio Boyden A SMITH IMPRESSIONS  1. Left ventricular ejection fraction, by estimation, is 30 to 35%. The left ventricle has moderately decreased  function. The left ventricle demonstrates global hypokinesis. There is moderate left ventricular hypertrophy. Left ventricular diastolic parameters are indeterminate.  2. Right ventricular systolic function is moderately reduced. The right ventricular size is mildly enlarged. Tricuspid regurgitation signal is inadequate for assessing PA pressure.  3. Left atrial size was severely dilated.  4. Right atrial size was severely dilated.  5. The mitral valve is degenerative. Mild to moderate mitral valve regurgitation. Moderate mitral stenosis. The mean mitral valve gradient is 8.0 mmHg with average heart rate of 102 bpm. Severe mitral annular calcification. MVA 1.5 cm^2 by continuity equation  6. The aortic valve is calcified. There is moderate calcification of the aortic valve. Aortic valve regurgitation is trivial. Moderate aortic valve stenosis. Mild AS by gradients (Vmax 2.1 m/s, MG 79mHg), moderate by AVA (1.2 cm^2) and DI (0.4). Suspect low flow low gradient moderate AS  7. The inferior vena cava is dilated in size with <50% respiratory variability, suggesting right atrial pressure of 15 mmHg. FINDINGS  Left Ventricle: Left ventricular ejection fraction, by estimation, is 30 to 35%. The left ventricle has moderately decreased function. The left ventricle demonstrates global hypokinesis. The left ventricular internal cavity size was small. There is moderate left ventricular hypertrophy. Left ventricular diastolic parameters are indeterminate. Right Ventricle: The right ventricular size is mildly enlarged. Right vetricular wall thickness was not well visualized. Right ventricular systolic function is moderately reduced. Tricuspid regurgitation signal is inadequate for assessing PA pressure. Left Atrium: Left atrial size was severely dilated. Right Atrium: Right atrial size was severely dilated. Pericardium: There is no evidence of pericardial effusion. Mitral Valve: The mitral valve is degenerative in appearance.  Severe mitral annular calcification. Mild to moderate mitral valve regurgitation. Moderate mitral valve stenosis. MV peak gradient, 15.4 mmHg. The mean mitral valve gradient is 8.0 mmHg with average heart rate of 102 bpm. Tricuspid Valve: The tricuspid valve is normal in structure. Tricuspid valve regurgitation is trivial. Aortic Valve: The aortic valve is calcified. There is moderate calcification of the aortic valve. Aortic valve regurgitation is trivial. Moderate aortic stenosis is present. Aortic valve mean gradient measures 10.0 mmHg. Aortic valve peak gradient measures 18.0 mmHg. Aortic valve area, by VTI measures 1.24 cm. Pulmonic Valve: The pulmonic valve was not well visualized. Pulmonic valve regurgitation is trivial. Aorta: The aortic root is normal in size and structure. Venous: The inferior vena cava is dilated in size with less than 50% respiratory variability, suggesting right atrial pressure  of 15 mmHg. IAS/Shunts: The interatrial septum was not well visualized.  LEFT VENTRICLE PLAX 2D LVIDd:         3.40 cm LVIDs:         3.00 cm LV PW:         1.30 cm LV IVS:        1.50 cm LVOT diam:     2.00 cm LV SV:         43 LV SV Index:   22 LVOT Area:     3.14 cm  RIGHT VENTRICLE            IVC RV Basal diam:  4.30 cm    IVC diam: 2.30 cm RV Mid diam:    4.00 cm RV S prime:     8.27 cm/s TAPSE (M-mode): 1.0 cm LEFT ATRIUM              Index        RIGHT ATRIUM           Index LA diam:        3.90 cm  2.00 cm/m   RA Area:     29.20 cm LA Vol (A2C):   104.0 ml 53.36 ml/m  RA Volume:   117.00 ml 60.03 ml/m LA Vol (A4C):   91.4 ml  46.89 ml/m LA Biplane Vol: 100.0 ml 51.31 ml/m  AORTIC VALVE AV Area (Vmax):    1.22 cm AV Area (Vmean):   1.28 cm AV Area (VTI):     1.24 cm AV Vmax:           212.00 cm/s AV Vmean:          143.000 cm/s AV VTI:            0.350 m AV Peak Grad:      18.0 mmHg AV Mean Grad:      10.0 mmHg LVOT Vmax:         82.30 cm/s LVOT Vmean:        58.100 cm/s LVOT VTI:          0.138  m LVOT/AV VTI ratio: 0.39  AORTA Ao Root diam: 3.10 cm MITRAL VALVE MV Area VTI:  1.54 cm        SHUNTS MV Peak grad: 15.4 mmHg       Systemic VTI:  0.14 m MV Mean grad: 8.0 mmHg        Systemic Diam: 2.00 cm MV Vmax:      1.96 m/s MV Vmean:     130.0 cm/s MR Peak grad:    104.0 mmHg MR Mean grad:    63.0 mmHg MR Vmax:         510.00 cm/s MR Vmean:        370.0 cm/s MR PISA:         1.57 cm MR PISA Eff ROA: 12 mm MR PISA Radius:  0.50 cm Oswaldo Milian MD Electronically signed by Oswaldo Milian MD Signature Date/Time: 04/30/2022/1:54:53 PM    Final     Cardiac Studies   Echo 04/30/22  1. Left ventricular ejection fraction, by estimation, is 30 to 35%. The  left ventricle has moderately decreased function. The left ventricle  demonstrates global hypokinesis. There is moderate left ventricular  hypertrophy. Left ventricular diastolic  parameters are indeterminate.   2. Right ventricular systolic function is moderately reduced. The right  ventricular size is mildly enlarged. Tricuspid regurgitation signal is  inadequate for assessing PA pressure.  3. Left atrial size was severely dilated.   4. Right atrial size was severely dilated.   5. The mitral valve is degenerative. Mild to moderate mitral valve  regurgitation. Moderate mitral stenosis. The mean mitral valve gradient is  8.0 mmHg with average heart rate of 102 bpm. Severe mitral annular  calcification. MVA 1.5 cm^2 by continuity  equation   6. The aortic valve is calcified. There is moderate calcification of the  aortic valve. Aortic valve regurgitation is trivial. Moderate aortic valve  stenosis. Mild AS by gradients (Vmax 2.1 m/s, MG 78mHg), moderate by AVA  (1.2 cm^2) and DI (0.4).  Suspect low flow low gradient moderate AS   7. The inferior vena cava is dilated in size with <50% respiratory  variability, suggesting right atrial pressure of 15 mmHg.   Patient Profile     67y.o. female with PMH atrial fibrillation,  CVA, remote PE presenting with bilateral LE edema and shortness of breath. Found to have submassive PE and new biventricular heart failure.  Assessment & Plan    Paroxysmal atrial fibrillation, presenting with RVR History of CVA (per neuro, felt to be small vessel disease) -CHA2DS2/VAS Stroke Risk Points=7 -see comments in note from 05/01/22. We have had extensive conversations about anticoagulation in the past. She is amenable to apixaban at least for the next few months, would prefer lifelong dosing but will continue to discuss with her  Acute systolic and diastolic heart failure Biventricular failure Chronic kidney disease stage 3a Valvular heart disease -limited echo shows LVEF 30-35%, RV with moderately reduced function, severe biatrial enlargement, mild-moderate MR, moderate MS (at HR 102 bpm), moderate AS -prior echo 12/2021 with preserved EF -Blood pressure is low, limits guideline directed medical therapy -admission weight 85.4 kg, weight today 88.8kg despite being net negative 2L.  -Cr stable at 1.56 today -we discussed ischemic evaluation. She does not want cath, does not want stents or bypass surgery. She would like to work on medical management first -does not tolerate metoprolol. No BP room for ARB/ARNI. Did discuss SLGT2i -started low dose bisoprolol 1/22, tolerated. Will increase to 5 mg today. Titrate as able. Then would add SGLT2i next  Submassive PE -recurrent, with prior PE >15 years ago in the setting of travel. She does note minimal activity over the last month due to pain from prior fall and lumbar fracture -elevated troponins 2/2 strain from PE vs acute heart failure  Coronary calcification Hypercholesterolemia Aortic atherosclerosis -declines statins -was on ezetimibe prior to admission -LDL goal <70  For questions or updates, please contact [place] consult [email or web address] for contact info under     Signed, BBuford Dresser MD   05/02/2022, 9:46 AM

## 2022-05-03 ENCOUNTER — Other Ambulatory Visit (HOSPITAL_COMMUNITY): Payer: Self-pay

## 2022-05-03 DIAGNOSIS — I429 Cardiomyopathy, unspecified: Secondary | ICD-10-CM | POA: Diagnosis not present

## 2022-05-03 DIAGNOSIS — M79671 Pain in right foot: Secondary | ICD-10-CM

## 2022-05-03 DIAGNOSIS — I5041 Acute combined systolic (congestive) and diastolic (congestive) heart failure: Secondary | ICD-10-CM | POA: Diagnosis not present

## 2022-05-03 DIAGNOSIS — I2609 Other pulmonary embolism with acute cor pulmonale: Secondary | ICD-10-CM | POA: Diagnosis not present

## 2022-05-03 DIAGNOSIS — I480 Paroxysmal atrial fibrillation: Secondary | ICD-10-CM | POA: Diagnosis not present

## 2022-05-03 DIAGNOSIS — M79672 Pain in left foot: Secondary | ICD-10-CM

## 2022-05-03 LAB — BASIC METABOLIC PANEL
Anion gap: 9 (ref 5–15)
BUN: 66 mg/dL — ABNORMAL HIGH (ref 8–23)
CO2: 32 mmol/L (ref 22–32)
Calcium: 9 mg/dL (ref 8.9–10.3)
Chloride: 98 mmol/L (ref 98–111)
Creatinine, Ser: 1.45 mg/dL — ABNORMAL HIGH (ref 0.44–1.00)
GFR, Estimated: 39 mL/min — ABNORMAL LOW (ref >60)
Glucose, Bld: 168 mg/dL — ABNORMAL HIGH (ref 70–99)
Potassium: 3.8 mmol/L (ref 3.5–5.1)
Sodium: 139 mmol/L (ref 135–145)

## 2022-05-03 MED ORDER — EMPAGLIFLOZIN 10 MG PO TABS
10.0000 mg | ORAL_TABLET | Freq: Every day | ORAL | Status: DC
  Administered 2022-05-03 – 2022-05-05 (×3): 10 mg via ORAL
  Filled 2022-05-03 (×3): qty 1

## 2022-05-03 NOTE — Progress Notes (Signed)
Rounding Note    Patient Name: Cynthia Cook Date of Encounter: 05/03/2022  [place] Cardiologist: Buford Dresser, MD   Subjective   Feeling a little better every day. Many questions today--reviewed plans for medications, discussed supplements, reviewed plans for SNF/PT. Breathing improved, but still feels very weak. Going to try to walk to the bathroom today if she can. No chest pain.  Inpatient Medications    Scheduled Meds:  apixaban  10 mg Oral BID   Followed by   Derrill Memo ON 05/08/2022] apixaban  5 mg Oral BID   bisoprolol  5 mg Oral Daily   empagliflozin  10 mg Oral Daily   levothyroxine  50 mcg Oral Q M,W,F   levothyroxine  75 mcg Oral Once per day on Sun Tue Thu Sat   lidocaine  1 patch Transdermal Q24H   predniSONE  50 mg Oral Q breakfast   senna-docusate  1 tablet Oral QHS   sodium chloride flush  3 mL Intravenous Q12H   traMADol  25 mg Oral Q6H   Continuous Infusions:   PRN Meds: acetaminophen **OR** acetaminophen, albuterol, Glycerin (Adult), morphine injection, ondansetron **OR** ondansetron (ZOFRAN) IV   Vital Signs    Vitals:   05/02/22 2013 05/03/22 0002 05/03/22 0357 05/03/22 0437  BP: 120/78 115/83 124/85   Pulse: 92 95 91   Resp: '20 19 19   '$ Temp: 98.1 F (36.7 C) 97.7 F (36.5 C) 97.7 F (36.5 C)   TempSrc: Oral Oral Oral   SpO2: 100% 97% 93%   Weight:    88.4 kg  Height:        Intake/Output Summary (Last 24 hours) at 05/03/2022 1013 Last data filed at 05/03/2022 0600 Gross per 24 hour  Intake 240 ml  Output 1250 ml  Net -1010 ml      05/03/2022    4:37 AM 05/02/2022    3:34 AM 04/30/2022    4:08 AM  Last 3 Weights  Weight (lbs) 194 lb 14.2 oz 195 lb 12.3 oz 188 lb 4.4 oz  Weight (kg) 88.4 kg 88.8 kg 85.4 kg      Telemetry    Atrial fibrillation, rates 70s when asleep, 80s-90s when awake, 100s-110s with activity - Personally Reviewed  ECG    No new since 04/29/22 - Personally Reviewed  Physical Exam    GEN: Well nourished, well developed in no acute distress NECK: No JVD CARDIAC: irregularly irregular rhythm, normal S1 and S2, no rubs or gallops. No murmur. VASCULAR: Radial pulses 2+ bilaterally.  RESPIRATORY:  Clear to auscultation without rales, wheezing or rhonchi  ABDOMEN: Soft, non-tender, non-distended MUSCULOSKELETAL:  Moves all 4 limbs independently SKIN: Warm and dry, no edema. Chronic skin changes NEUROLOGIC:  No focal neuro deficits noted. PSYCHIATRIC:  Normal affect    Labs    High Sensitivity Troponin:   Recent Labs  Lab 04/29/22 0639 04/29/22 0849  TROPONINIHS 24* 24*     Chemistry Recent Labs  Lab 04/29/22 0639 04/30/22 0124 04/30/22 1442 05/01/22 0051 05/02/22 0319 05/03/22 0329  NA 142 139   < > 139 141 139  K 4.4 5.2*   < > 4.7 4.5 3.8  CL 105 104   < > 101 98 98  CO2 26 25   < > 31 33* 32  GLUCOSE 96 121*   < > 122* 111* 168*  BUN 46* 44*   < > 47* 64* 66*  CREATININE 1.37* 1.48*   < > 1.57* 1.56* 1.45*  CALCIUM 9.1 9.0   < > 8.7* 8.7* 9.0  MG  --  2.1  --   --   --   --   PROT 6.8  --   --   --   --   --   ALBUMIN 3.0*  --   --   --   --   --   AST 24  --   --   --   --   --   ALT 23  --   --   --   --   --   ALKPHOS 66  --   --   --   --   --   BILITOT 0.7  --   --   --   --   --   GFRNONAA 42* 38*   < > 35* 36* 39*  ANIONGAP 11 10   < > '7 10 9   '$ < > = values in this interval not displayed.    Lipids No results for input(s): "CHOL", "TRIG", "HDL", "LABVLDL", "LDLCALC", "CHOLHDL" in the last 168 hours.  Hematology Recent Labs  Lab 04/29/22 0639 04/30/22 0124 05/01/22 0051  WBC 6.1 9.0 7.3  RBC 3.85* 4.03 3.74*  HGB 10.7* 11.4* 10.6*  HCT 34.9* 35.1* 32.9*  MCV 90.6 87.1 88.0  MCH 27.8 28.3 28.3  MCHC 30.7 32.5 32.2  RDW 14.9 14.8 14.8  PLT 257 218 223   Thyroid  Recent Labs  Lab 04/29/22 0849  TSH 4.812*    BNP Recent Labs  Lab 04/29/22 0639  BNP 580.5*    DDimer No results for input(s): "DDIMER" in the last 168  hours.   Radiology    No results found.  Cardiac Studies   Echo 04/30/22  1. Left ventricular ejection fraction, by estimation, is 30 to 35%. The  left ventricle has moderately decreased function. The left ventricle  demonstrates global hypokinesis. There is moderate left ventricular  hypertrophy. Left ventricular diastolic  parameters are indeterminate.   2. Right ventricular systolic function is moderately reduced. The right  ventricular size is mildly enlarged. Tricuspid regurgitation signal is  inadequate for assessing PA pressure.   3. Left atrial size was severely dilated.   4. Right atrial size was severely dilated.   5. The mitral valve is degenerative. Mild to moderate mitral valve  regurgitation. Moderate mitral stenosis. The mean mitral valve gradient is  8.0 mmHg with average heart rate of 102 bpm. Severe mitral annular  calcification. MVA 1.5 cm^2 by continuity  equation   6. The aortic valve is calcified. There is moderate calcification of the  aortic valve. Aortic valve regurgitation is trivial. Moderate aortic valve  stenosis. Mild AS by gradients (Vmax 2.1 m/s, MG 18mHg), moderate by AVA  (1.2 cm^2) and DI (0.4).  Suspect low flow low gradient moderate AS   7. The inferior vena cava is dilated in size with <50% respiratory  variability, suggesting right atrial pressure of 15 mmHg.   Patient Profile     67y.o. female with PMH atrial fibrillation, CVA, remote PE presenting with bilateral LE edema and shortness of breath. Found to have submassive PE and new biventricular heart failure.  Assessment & Plan    Paroxysmal atrial fibrillation, presenting with RVR History of CVA (per neuro, felt to be small vessel disease) -CHA2DS2/VAS Stroke Risk Points=7 -see comments in note from 05/01/22. We have had extensive conversations about anticoagulation in the past. She is amenable to apixaban at least for  the next few months, would prefer lifelong dosing. We briefly  discussed Watchman today but reviewed that this would not prevent recurrent PE. She believes she will be able to afford the apixaban and is willing to continue for now.  Acute systolic and diastolic heart failure Biventricular failure Chronic kidney disease stage 3a Valvular heart disease -limited echo shows LVEF 30-35%, RV with moderately reduced function, severe biatrial enlargement, mild-moderate MR, moderate MS (at HR 102 bpm), moderate AS -prior echo 12/2021 with preserved EF -Blood pressure is low, limits guideline directed medical therapy -admission weight 85.4 kg (suspect inaccurate), weight today 88.4kg despite being net negative 3L.  -Cr improved at 1.45 today -we discussed ischemic evaluation. She does not want cath, does not want stents or bypass surgery. She would like to work on medical management first -does not tolerate metoprolol. No BP room for ARB/ARNI.  -tolerating 5 mg bisoprolol daily. Discussed SGLT2i again today, she is amenable. She appears euvolemic, so will stop IV lasix (already received AM dose) and start jardiance today  Submassive PE -recurrent, with prior PE >15 years ago in the setting of travel. She does note minimal activity over the last month due to pain from prior fall and lumbar fracture -elevated troponins 2/2 strain from PE vs acute heart failure  Coronary calcification Hypercholesterolemia Aortic atherosclerosis -declines statins -was on ezetimibe prior to admission -LDL goal <70  Time Spent with Patient: I have spent a total of 60 minutes in patient care, including reviewing hospital notes, telemetry, EKGs, labs; discussing with the team; examining the patient; and establishing an assessment and plan that was discussed with the patient.  > 50% of time was spent in direct patient care.    For questions or updates, please contact [place] Please consult [email or web address] for contact info under     Signed, Buford Dresser, MD   05/03/2022, 10:13 AM

## 2022-05-03 NOTE — Progress Notes (Signed)
Request faxed to [facility] for Lumbar MRI records.

## 2022-05-03 NOTE — Consult Note (Signed)
[facility] [place]  Patient name: Cynthia Cook MRN: [id number] DOB: [birth year] Sex: female   REQUESTING PROVIDER:   Dr. Florene Glen   REASON FOR CONSULT:    Bilateral foot pain, concern for arterial ischemia  HISTORY OF PRESENT ILLNESS:   Cynthia Cook is a 67 y.o. female, who was admitted on 04/29/2022 with decreased sensation in both legs.  She apparently had a fall on December 14 and has been diagnosed with compression fracture of her lumbar spine.  Since that time she is can complaining of paresthesias in her feet and difficulty walking secondary to severe pain.  She was diagnosed with RSV pneumonia on 03/30/2022 and has had progressive decline since that time.  She does suffer from atrial fibrillation but has not been taking medications secondary to financial constraints.  She has a history of pulmonary embolism as well as stroke and subdural hematoma.  Pedal pulses were unable to be palpated and so vascular consultation was requested.  She does not have any ulcerations or wounds on her feet.  PAST MEDICAL HISTORY    Past Medical History:  Diagnosis Date   Back pain    Gout    Murmur    PE (pulmonary embolism)    Persistent atrial fibrillation (HCC)    Pulmonary emboli ([place]) 02/05/2020   Syncope    Thyroid disease      FAMILY HISTORY   Family History  Family history unknown: Yes    SOCIAL HISTORY:   Social History   Socioeconomic History   Marital status: Divorced    Spouse name: Not on file   Number of children: Not on file   Years of education: Not on file   Highest education level: Not on file  Occupational History    Comment: retired Therapist, sports  Tobacco Use   Smoking status: Never   Smokeless tobacco: Never  Vaping Use   Vaping Use: Never used  Substance and Sexual Activity   Alcohol use: No   Drug use: Never   Sexual activity: Not on file  Other Topics Concern   Not on file  Social History  Narrative   Lives with her dog, she has no family members. 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   Social Determinants of Health   Financial Resource Strain: High Risk (06/14/2021)   Overall Financial Resource Strain (CARDIA)    Difficulty of Paying Living Expenses: Hard  Food Insecurity: No Food Insecurity (03/15/2020)   Hunger Vital Sign    Worried About Running Out of Food in the Last Year: Never true    Ran Out of Food in the Last Year: Never true  Transportation Needs: No Transportation Needs (06/14/2021)   PRAPARE - Hydrologist (Medical): No    Lack of Transportation (Non-Medical): No  Physical Activity: Not on file  Stress: Stress Concern Present (06/14/2021)   [place]    Feeling of Stress : Very much  Social Connections: Not on file  Intimate Partner Violence: Not on file    ALLERGIES:    Allergies  Allergen Reactions   Codeine Nausea Only   Duragesic-100 [Fentanyl] Nausea And Vomiting    Dizziness Lightheadedness    Mucinex [Guaifenesin Er] Other (See Comments)    Syncope    Nsaids Other (See Comments)    Kidney disease    CURRENT MEDICATIONS:    Current Facility-Administered Medications  Medication Dose Route  Frequency Provider Last Rate Last Admin   acetaminophen (TYLENOL) tablet 650 mg  650 mg Oral Q6H PRN Norval Morton, MD       Or   acetaminophen (TYLENOL) suppository 650 mg  650 mg Rectal Q6H PRN Skibinski Plan A, MD       albuterol (PROVENTIL) (2.5 MG/3ML) 0.083% nebulizer solution 2.5 mg  2.5 mg Nebulization Q6H PRN Norval Morton, MD       apixaban (ELIQUIS) tablet 10 mg  10 mg Oral BID Gwynne Edinger, MD   10 mg at 05/03/22 7782   Followed by   Derrill Memo ON 05/08/2022] apixaban (ELIQUIS) tablet 5 mg  5 mg Oral BID Wouk, Ailene Rud, MD       bisoprolol (ZEBETA) tablet 5 mg  5 mg Oral Daily Buford Dresser, MD   5 mg at  05/03/22 4235   empagliflozin (JARDIANCE) tablet 10 mg  10 mg Oral Daily Buford Dresser, MD   10 mg at 05/03/22 1332   Glycerin (Adult) 2 g suppository 1 suppository  1 suppository Rectal Daily PRN Gwynne Edinger, MD   1 suppository at 05/02/22 1441   levothyroxine (SYNTHROID) tablet 50 mcg  50 mcg Oral Q M,W,F Norval Morton, MD   50 mcg at 05/03/22 0936   levothyroxine (SYNTHROID) tablet 75 mcg  75 mcg Oral Once per day on Sun Tue Thu Sat Louischarles Plan A, MD   75 mcg at 05/02/22 0624   lidocaine (LIDODERM) 5 % 1 patch  1 patch Transdermal Q24H Lamaster Plan A, MD   1 patch at 04/29/22 2113   morphine (PF) 2 MG/ML injection 2 mg  2 mg Intravenous Q4H PRN Siverling Plan A, MD   2 mg at 04/29/22 1652   ondansetron (ZOFRAN) tablet 4 mg  4 mg Oral Q6H PRN Norval Morton, MD       Or   ondansetron (ZOFRAN) injection 4 mg  4 mg Intravenous Q6H PRN Steinruck Plan A, MD   4 mg at 04/30/22 3614   predniSONE (DELTASONE) tablet 50 mg  50 mg Oral Q breakfast Gwynne Edinger, MD   50 mg at 05/03/22 0935   senna-docusate (Senokot-S) tablet 1 tablet  1 tablet Oral QHS Gwynne Edinger, MD   1 tablet at 05/02/22 2111   sodium chloride flush (NS) 0.9 % injection 3 mL  3 mL Intravenous Q12H Yarbrough Plan A, MD   3 mL at 05/03/22 4315   traMADol (ULTRAM) tablet 25 mg  25 mg Oral Q6H Wouk, Ailene Rud, MD   25 mg at 05/03/22 1800    REVIEW OF SYSTEMS:   '[X]'$  denotes positive finding, '[ ]'$  denotes negative finding Cardiac  Comments:  Chest pain or chest pressure:    Shortness of breath upon exertion:    Short of breath when lying flat:    Irregular heart rhythm:        Vascular    Pain in calf, thigh, or hip brought on by ambulation:    Pain in feet at night that wakes you up from your sleep:     Blood clot in your veins:    Leg swelling:         Pulmonary    Oxygen at home:    Productive cough:     Wheezing:         Neurologic    Sudden weakness in arms or legs:     Sudden  numbness in arms or legs:  Sudden onset of difficulty speaking or slurred speech:    Temporary loss of vision in one eye:     Problems with dizziness:         Gastrointestinal    Blood in stool:      Vomited blood:         Genitourinary    Burning when urinating:     Blood in urine:        Psychiatric    Major depression:         Hematologic    Bleeding problems:    Problems with blood clotting too easily:        Skin    Rashes or ulcers:        Constitutional    Fever or chills:     PHYSICAL EXAM:   Vitals:   05/03/22 1100 05/03/22 1109 05/03/22 1603 05/03/22 1956  BP:  125/85 133/73 126/86  Pulse:  83 87 87  Resp:  '20 16 20  '$ Temp: 97.9 F (36.6 C)  97.7 F (36.5 C) 97.8 F (36.6 C)  TempSrc:    Oral  SpO2:  97%    Weight:      Height:        GENERAL: The patient is a well-nourished female, in no acute distress. The vital signs are documented above. CARDIAC: There is a regular rate and rhythm.  VASCULAR: Posterior tibial and peroneal Doppler signals appear to be biphasic.  She is able to move her toes.  Her feet are warm and appear well-perfused. PULMONARY: Nonlabored respirations MUSCULOSKELETAL: There are no major deformities or cyanosis. NEUROLOGIC: No focal weakness or paresthesias are detected. SKIN: There are no ulcers or rashes noted. PSYCHIATRIC: The patient has a normal affect.  STUDIES:   None  ASSESSMENT and PLAN   Atherosclerotic vascular disease: The patient does not have any evidence of acute ischemia.  She appears to have adequate blood flow by Doppler with biphasic signals.  She is scheduled to have ABIs performed tomorrow.  I doubt that her leg numbness is related to her vascular disease but we will follow-up after her ABIs have been completed.   Leia Alf, MD, FACS [facility] [phone] Pager [phone]

## 2022-05-03 NOTE — Progress Notes (Signed)
Pt expresses increasing concerns for decreased sensations in her feet. She also expresses concerns that orthopedics have not been brought on concerning the fracture in her spine.  The patient states she wishes for the doctor to tell her exactly what the plan is for her and what treatment options are available.

## 2022-05-03 NOTE — Plan of Care (Signed)
  Problem: Education: Goal: Ability to demonstrate management of disease process will improve Outcome: Progressing Goal: Ability to verbalize understanding of medication therapies will improve Outcome: Progressing   Problem: Activity: Goal: Capacity to carry out activities will improve Outcome: Progressing   Problem: Cardiac: Goal: Ability to achieve and maintain adequate cardiopulmonary perfusion will improve Outcome: Progressing   Problem: Education: Goal: Knowledge of General Education information will improve Description: Including pain rating scale, medication(s)/side effects and non-pharmacologic comfort measures Outcome: Progressing   Problem: Health Behavior/Discharge Planning: Goal: Ability to manage health-related needs will improve Outcome: Progressing   Problem: Clinical Measurements: Goal: Ability to maintain clinical measurements within normal limits will improve Outcome: Progressing Goal: Will remain free from infection Outcome: Progressing Goal: Cardiovascular complication will be avoided Outcome: Progressing   Problem: Activity: Goal: Risk for activity intolerance will decrease Outcome: Progressing   Problem: Nutrition: Goal: Adequate nutrition will be maintained Outcome: Progressing   Problem: Coping: Goal: Level of anxiety will decrease Outcome: Progressing   Problem: Elimination: Goal: Will not experience complications related to bowel motility Outcome: Progressing Goal: Will not experience complications related to urinary retention Outcome: Progressing   Problem: Pain Managment: Goal: General experience of comfort will improve Outcome: Progressing   Problem: Safety: Goal: Ability to remain free from injury will improve Outcome: Progressing   Problem: Skin Integrity: Goal: Risk for impaired skin integrity will decrease Outcome: Progressing

## 2022-05-03 NOTE — Progress Notes (Signed)
Mobility Specialist Progress Note:   05/03/22 1310  Mobility  Activity Ambulated with assistance to bathroom  Level of Assistance Standby assist, set-up cues, supervision of patient - no hands on  Assistive Device Front wheel walker  Distance Ambulated (ft) 12 ft  Activity Response Tolerated well  $Mobility charge 1 Mobility   Pt in chair asking to use bathroom. No complaints of pain. Left in bathroom and was instructed to pull string when finished.   Gareth Eagle Effie Janoski Mobility Specialist Please contact via [employer] or  Rehab Office at [phone]

## 2022-05-03 NOTE — TOC Progression Note (Signed)
Transition of Care [facility]) - Progression Note    Patient Details  Name: Cynthia Cook MRN: [id number] Date of Birth: 01-Mar-1956  Transition of [facility]) CM/SW [place], [place] Phone Number: 05/03/2022, 12:39 PM  Clinical Narrative:     Received call from [place] [phone]- he reports has an open case, He states the patient lives home alone and no support. CSW advised the patient is agreeable to short term rehab at [facility].  CSW will update APS worker with placement details once they have been confirmed.   TOC will continue to follow and assist with discharge planning.  Thurmond Butts, MSW, LCSW Clinical Social Worker    Expected Discharge Plan: Skilled Nursing Facility Barriers to Discharge: Ship broker, Continued Medical Work up, SNF Pending bed offer  Expected Discharge Plan and Services In-house Referral: Clinical Social Work Discharge Planning Services: CM Consult Post Acute Care Choice: Durable Medical Equipment Living arrangements for the past 2 months: Apartment                           HH Arranged: PT, OT, Nurse's Aide, Social Work [employer] Agency: [employer]         Social Determinants of Health (SDOH) Interventions SDOH Screenings   Food Insecurity: No Food Insecurity (03/15/2020)  Transportation Needs: No Transportation Needs (06/14/2021)  Depression (PHQ2-9): Medium Risk (02/18/2021)  Financial Resource Strain: High Risk (06/14/2021)  Stress: Stress Concern Present (06/14/2021)  Tobacco Use: Low Risk  (05/02/2022)    Readmission Risk Interventions     No data to display

## 2022-05-03 NOTE — TOC Progression Note (Signed)
Transition of Care [facility]) - Progression Note    Patient Details  Name: Cynthia Cook MRN: [id number] Date of Birth: 08-05-1955  Transition of Care [facility]) CM/SW [place], [place] Phone Number: 05/03/2022, 3:36 PM  Clinical Narrative:     CSW spoke with patient by phone- provided her with bed offers along with medicare star ratings. CSW emailed bed offers as requested.  CSW Cherish delivered copy to her room. She states she will review. CSW requested select 2 facilities for possible placement.   TOC will continue to follow and assist with discharge planning.  Thurmond Butts, MSW, LCSW Clinical Social Worker    Expected Discharge Plan: Skilled Nursing Facility Barriers to Discharge: Ship broker, Continued Medical Work up, SNF Pending bed offer  Expected Discharge Plan and Services In-house Referral: Clinical Social Work Discharge Planning Services: CM Consult Post Acute Care Choice: Durable Medical Equipment Living arrangements for the past 2 months: Apartment                           HH Arranged: PT, OT, Nurse's Aide, Social Work [employer] Agency: [employer]         Social Determinants of Health (SDOH) Interventions SDOH Screenings   Food Insecurity: No Food Insecurity (03/15/2020)  Transportation Needs: No Transportation Needs (06/14/2021)  Depression (PHQ2-9): Medium Risk (02/18/2021)  Financial Resource Strain: High Risk (06/14/2021)  Stress: Stress Concern Present (06/14/2021)  Tobacco Use: Low Risk  (05/02/2022)    Readmission Risk Interventions     No data to display

## 2022-05-03 NOTE — Progress Notes (Signed)
PROGRESS NOTE    Cynthia Cook  JXB:[id number] DOB: Dec 27, 1955 DOA: 04/29/2022 PCP: Cynthia Del, DO  Chief Complaint  Patient presents with   Lower legs decreased sensation     Brief Narrative:  Cynthia Cook is Cynthia Cook 67 y.o. female with medical history significant of paroxysmal atrial fibrillation not on anticoagulation, history of pulmonary embolism not on anticoagulation, history of CVA, subdural hematoma, hypothyroidism, and chronic back pain who presents with complaints of decreased sensation in her lower legs.  She states that she had Bari Handshoe fall on December 14 and had been diagnosed with Cynthia Cook compression fracture of her lumbar spine.  Ever since that time she has had paresthesias in her feet and unable to walk in Cynthia Cook straight line or stand due to having severe pain. Patient had been diagnosed with RSV 03/30/2022.  Since that time she had Cynthia Cook progressive decline and has not been doing well or able to care for self.  Patient states that her dog had died and so she is at home alone.  Neighbors have been bringing food to her that is not the healthiest for which she noticed that she had started to have lower extremity swelling over the last several days.  She has Cynthia Cook history of atrial fibrillation and previously had been on Eliquis, but had not been able to afford this medication to continue it.  She reports not being able to tolerate Coumadin due to bleeding and has been taking Cynthia Cook Nattokinase supplement to thin her blood instead.  She denies having any chest pain or palpitations, but has felt more short of breath.  Patient patient is not on any blood pressure medication and states that normally her blood pressures and heart rates are well-controlled. She had not been able to follow-up with anyone and states she is felt like she is falling through the cracks.   Review of records note that patient had been admitted with dysarthria found to have an acute stroke with what was thought to be chronic bilateral subdural  hematomas back in 12/2021 where she was advised to need to be on anticoagulation and declined at that time for which she was told that she would likely have recurrent strokes could be like threatening and disabling.   Assessment & Plan:   Principal Problem:   Pulmonary embolism (HCC) Active Problems:   CHF (congestive heart failure) (HCC)   Atrial fibrillation (HCC)   History of pulmonary embolism   Elevated troponin I level   Lumbar compression fracture (HCC)   Normocytic anemia   CKD (chronic kidney disease) stage 3, GFR 30-59 ml/min (HCC) - baseline SCr 1.3   Subdural hematoma (HCC)   History of CVA (cerebrovascular accident)   Hypothyroidism   Acute combined systolic and diastolic heart failure (HCC)   Paroxysmal atrial fibrillation with RVR ([place])   Gout flare   # Pulmonary embolism # Hypoxic respiratory failure Acute, severe right-sided PE on CTA, evidence right heart strain. Hemodynamically stable and relatively asymptomatic. PVL initial read negative for DVT. Hx of PE in past, not anticoagulated at time of presentation. Risk factors include recent illness (rsv) and relative immobility 2/2 recent fall with lumbar compression fracture.  - converted from heparin to apixaban on 1/22 - on RA today   # HFrEF 2/2 PE. TTE with EF 30-35, moderately reduced RV function. Likely Cynthia Cook consequence of PE, Cynthia Cook.  - cardiology following - continuing IV lasix - bisoprolol started 1/22   # Cynthia Cook with RVR - chads2vasc is 7. - bisoprolol as  above - apixaban as above   # Peripheral Vascular Disease  Mottled Bilateral Lower Extremities - difficult to appreciate pedal pulses, RN was able to doppler pulse - ABI's pending  - appreciate vascular assistance   # Lumbar compression fracture Patient reports recent fall evaluated at [facility] with mri which showed lumbar compression fracture MRI showed compression fracture along superior endplate of E52 and L5 without retropulsion of bone.   Chronic benign compression gracture evident at L2 with retropulsion of bone along the superior endplate contributing to mild central canal stenosis.  Overall fractures are generally benign in appearance, likely 2/2 osteoporosis or trauma.  Mild lumbar region DDD without evidence of discu herniation.  Grade 1 spondylolisthesis at L4-5 and mild lumbar curvature. (Obtained via verbal report from [facility], please obtain report from [facility] for definitive report) - pain control - outpt ortho f/u pending with Dr. Orland Penman - pt/ot consulted, advising SNF, patient desires this, TOC consulted - given chronicity of change in sensation from lower legs, I don't think we need Cynthia Cook repeat MRI at this time   # Gout with flare Patient reported onset of typical gout pain left ankle 1/23 similar to prior flares. Says her flares don't typically involve redness but the ankle is quite tender to touch/movement. Says prednisone tapers are what works best for her. - currently on prednisone 50 qd   # CKD 3b Stable - monitor   # Hx CVA - home plavix discontinued; anticoagulation as above - not on statin due to patient preference   # Hypothyroid - TSH mildly elevated - cont home levothyroxine, will need outpt repeat of tsh      DVT prophylaxis: eliquis Code Status: full Family Communication: none Disposition:   Status is: Inpatient Remains inpatient appropriate because: continued inpatient workup needed   Consultants:  Cardiology   Procedures:  Echo IMPRESSIONS     1. Left ventricular ejection fraction, by estimation, is 30 to 35%. The  left ventricle has moderately decreased function. The left ventricle  demonstrates global hypokinesis. There is moderate left ventricular  hypertrophy. Left ventricular diastolic  parameters are indeterminate.   2. Right ventricular systolic function is moderately reduced. The right  ventricular size is mildly enlarged. Tricuspid regurgitation signal is   inadequate for assessing PA pressure.   3. Left atrial size was severely dilated.   4. Right atrial size was severely dilated.   5. The mitral valve is degenerative. Mild to moderate mitral valve  regurgitation. Moderate mitral stenosis. The mean mitral valve gradient is  8.0 mmHg with average heart rate of 102 bpm. Severe mitral annular  calcification. MVA 1.5 cm^2 by continuity  equation   6. The aortic valve is calcified. There is moderate calcification of the  aortic valve. Aortic valve regurgitation is trivial. Moderate aortic valve  stenosis. Mild AS by gradients (Vmax 2.1 m/s, MG 76mHg), moderate by AVA  (1.2 cm^2) and DI (0.4).  Suspect low flow low gradient moderate AS   7. The inferior vena cava is dilated in size with <50% respiratory  variability, suggesting right atrial pressure of 15 mmHg.   LE [place]:  RIGHT:  - There is no evidence of deep vein thrombosis in the lower extremity.    - No cystic structure found in the popliteal fossa.    LEFT:  - There is no evidence of deep vein thrombosis in the lower extremity.    - Cynthia Cook cystic structure is found in the popliteal fossa.  Antimicrobials:  Anti-infectives (From admission, onward)    None       Subjective: Complaints of decreased sensation to feet/toes since around the time of her injury  No saddle anesthesia, no issues urinating   Objective: Vitals:   05/03/22 1019 05/03/22 1100 05/03/22 1109 05/03/22 1603  BP:   125/85 133/73  Pulse: 92  83 87  Resp: '20  20 16  '$ Temp:  97.9 F (36.6 C)  97.7 F (36.5 C)  TempSrc:      SpO2: 100%  97%   Weight:      Height:        Intake/Output Summary (Last 24 hours) at 05/03/2022 1707 Last data filed at 05/03/2022 1344 Gross per 24 hour  Intake 240 ml  Output 1350 ml  Net -1110 ml   Filed Weights   04/30/22 0408 05/02/22 0334 05/03/22 0437  Weight: 85.4 kg 88.8 kg 88.4 kg    Examination:  General exam: Appears calm and comfortable  Respiratory  system: Clear to auscultation. Cardiovascular system: RRR Gastrointestinal system: Abdomen is nondistended, soft and nontender.  Central nervous system: Alert and oriented. No focal neurological deficits. Extremities: no significant LE edema, bronze LE's bilaterally, scale noted to LLE.  Toes mottled in appearance.  I was unable to appreciate DP/PT pulses.  RN was able to doppler pulses.     Data Reviewed: I have personally reviewed following labs and imaging studies  CBC: Recent Labs  Lab 04/29/22 0639 04/30/22 0124 05/01/22 0051  WBC 6.1 9.0 7.3  HGB 10.7* 11.4* 10.6*  HCT 34.9* 35.1* 32.9*  MCV 90.6 87.1 88.0  PLT 257 218 419    Basic Metabolic Panel: Recent Labs  Lab 04/30/22 0124 04/30/22 1442 05/01/22 0051 05/02/22 0319 05/03/22 0329  NA 139 140 139 141 139  K 5.2* 4.6 4.7 4.5 3.8  CL 104 103 101 98 98  CO2 '25 26 31 '$ 33* 32  GLUCOSE 121* 130* 122* 111* 168*  BUN 44* 45* 47* 64* 66*  CREATININE 1.48* 1.61* 1.57* 1.56* 1.45*  CALCIUM 9.0 8.8* 8.7* 8.7* 9.0  MG 2.1  --   --   --   --     GFR: Estimated Creatinine Clearance: 41 mL/min (Sherlene Rickel) (by C-G formula based on SCr of 1.45 mg/dL (H)).  Liver Function Tests: Recent Labs  Lab 04/29/22 0639  AST 24  ALT 23  ALKPHOS 66  BILITOT 0.7  PROT 6.8  ALBUMIN 3.0*    CBG: No results for input(s): "GLUCAP" in the last 168 hours.   Recent Results (from the past 240 hour(s))  Culture, blood (single)     Status: None (Preliminary result)   Collection Time: 04/29/22  6:40 AM   Specimen: BLOOD LEFT ARM  Result Value Ref Range Status   Specimen Description BLOOD LEFT ARM  Final   Special Requests   Final    BOTTLES DRAWN AEROBIC AND ANAEROBIC Blood Culture adequate volume   Culture   Final    NO GROWTH 4 DAYS Performed at [facility], [address], [place], [place] [address]    Report Status PENDING  Incomplete         Radiology Studies: No results found.      Scheduled Meds:  apixaban   10 mg Oral BID   Followed by   Derrill Memo ON 05/08/2022] apixaban  5 mg Oral BID   bisoprolol  5 mg Oral Daily   empagliflozin  10 mg Oral Daily   levothyroxine  50 mcg Oral Q M,W,F   levothyroxine  75 mcg Oral Once per day on Sun Tue Thu Sat   lidocaine  1 patch Transdermal Q24H   predniSONE  50 mg Oral Q breakfast   senna-docusate  1 tablet Oral QHS   sodium chloride flush  3 mL Intravenous Q12H   traMADol  25 mg Oral Q6H   Continuous Infusions:   LOS: 4 days    Time spent: over 30 min    Fayrene Helper, MD [facility]   To contact the attending provider between 7A-7P or the covering provider during after hours 7P-7A, please log into the web site [email or web address] and access using universal [place] password for that web site. If you do not have the password, please call the hospital operator.  05/03/2022, 5:07 PM

## 2022-05-03 NOTE — TOC Benefit Eligibility Note (Signed)
Patient Teacher, English as a foreign language completed.    The patient is currently admitted and upon discharge could be taking Eliquis 5 mg.  The current 30 day co-pay is $47.00.   The patient is insured through [place], [place] Patient Advocate Specialist [place] Patient Advocate Team Direct Number: [phone]  Fax: [phone]

## 2022-05-04 ENCOUNTER — Encounter (HOSPITAL_COMMUNITY): Payer: Self-pay | Admitting: Internal Medicine

## 2022-05-04 ENCOUNTER — Inpatient Hospital Stay (HOSPITAL_COMMUNITY): Payer: Medicare HMO

## 2022-05-04 DIAGNOSIS — I5041 Acute combined systolic (congestive) and diastolic (congestive) heart failure: Secondary | ICD-10-CM | POA: Diagnosis not present

## 2022-05-04 DIAGNOSIS — I480 Paroxysmal atrial fibrillation: Secondary | ICD-10-CM | POA: Diagnosis not present

## 2022-05-04 DIAGNOSIS — R0989 Other specified symptoms and signs involving the circulatory and respiratory systems: Secondary | ICD-10-CM | POA: Diagnosis not present

## 2022-05-04 DIAGNOSIS — I2609 Other pulmonary embolism with acute cor pulmonale: Secondary | ICD-10-CM | POA: Diagnosis not present

## 2022-05-04 LAB — BASIC METABOLIC PANEL
Anion gap: 11 (ref 5–15)
BUN: 68 mg/dL — ABNORMAL HIGH (ref 8–23)
CO2: 31 mmol/L (ref 22–32)
Calcium: 8.8 mg/dL — ABNORMAL LOW (ref 8.9–10.3)
Chloride: 96 mmol/L — ABNORMAL LOW (ref 98–111)
Creatinine, Ser: 1.53 mg/dL — ABNORMAL HIGH (ref 0.44–1.00)
GFR, Estimated: 37 mL/min — ABNORMAL LOW (ref >60)
Glucose, Bld: 139 mg/dL — ABNORMAL HIGH (ref 70–99)
Potassium: 3.9 mmol/L (ref 3.5–5.1)
Sodium: 138 mmol/L (ref 135–145)

## 2022-05-04 LAB — CULTURE, BLOOD (SINGLE)
Culture: NO GROWTH
Special Requests: ADEQUATE

## 2022-05-04 LAB — VAS US ABI WITH/WO TBI
Left ABI: 0.77
Right ABI: 0.81

## 2022-05-04 NOTE — Progress Notes (Signed)
Physical Therapy Treatment Patient Details Name: Cynthia Cook MRN: [id number] DOB: Jan 24, 1956 Today's Date: 05/04/2022   History of Present Illness Pt is a 67 y/o female presenting on 1/20 with decreased sensation in [place], SOB. Pt with recent fall 03/23/22 with compression fx, 03/30/22 RSV. Found to be in afib RVR. Chest xray with bilateral pleural effusions, CT angiogram with R sided PE with mild R sided heart strain. PMH includes: afib, PE, CVA, subdural hematoma, chronic back pain, gout, syncope.    PT Comments    Pt making steady progress with mobility and is working on learning to use rolling walker correctly. Pt continues to fatigue with minimal activity and is unable to mobilize safely on her own. Continue to recommend SNF at dc for further rehab.    Recommendations for follow up therapy are one component of a multi-disciplinary discharge planning process, led by the attending physician.  Recommendations may be updated based on patient status, additional functional criteria and insurance authorization.  Follow Up Recommendations  Skilled nursing-short term rehab (<3 hours/day) Can patient physically be transported by private vehicle: No   Assistance Recommended at Discharge Frequent or constant Supervision/Assistance  Patient can return home with the following A little help with walking and/or transfers;Assistance with cooking/housework;Assist for transportation;A little help with bathing/dressing/bathroom;Help with stairs or ramp for entrance   Equipment Recommendations  Rolling walker (2 wheels) (wide walker)    Recommendations for Other Services       Precautions / Restrictions Precautions Precautions: Fall;Back Precaution Comments: recent compression fx Restrictions Weight Bearing Restrictions: No     Mobility  Bed Mobility               General bed mobility comments: Pt up in chair    Transfers Overall transfer level: Needs assistance Equipment used:  Rolling walker (2 wheels) Transfers: Sit to/from Stand, Bed to chair/wheelchair/BSC Sit to Stand: Min guard           General transfer comment: Assist for safety. Incr time and effort to rise.    Ambulation/Gait Ambulation/Gait assistance: Min assist Gait Distance (Feet): 80 Feet (80' x 1, 30' x 1) Assistive device: Rolling walker (2 wheels) Gait Pattern/deviations: Step-through pattern, Decreased stride length, Trunk flexed, Wide base of support Gait velocity: decr Gait velocity interpretation: <1.8 ft/sec, indicate of risk for recurrent falls   General Gait Details: Assist for balance and verbal cues to stay closer to walker. Pt with wide base gait at baseline causing her feet to kick the rear legs of regular rolling walker. Switched to wide walker. Continued to want to stand behind walker and lean forward into flexion despite verbal cues.   Stairs             Wheelchair Mobility    Modified Rankin (Stroke Patients Only)       Balance Overall balance assessment: Needs assistance Sitting-balance support: No upper extremity supported, Feet supported Sitting balance-Leahy Scale: Good     Standing balance support: Bilateral upper extremity supported, During functional activity Standing balance-Leahy Scale: Poor Standing balance comment: walker and min guard for static standing                            Cognition Arousal/Alertness: Awake/alert Behavior During Therapy: WFL for tasks assessed/performed Overall Cognitive Status: Within Functional Limits for tasks assessed  Exercises      General Comments General comments (skin integrity, edema, etc.): VSS on RA      Pertinent Vitals/Pain Pain Assessment Pain Assessment: No/denies pain    Home Living                          Prior Function            PT Goals (current goals can now be found in the care plan section)  Progress towards PT goals: Progressing toward goals    Frequency    Min 3X/week      PT Plan Current plan remains appropriate    Co-evaluation              AM-PAC PT "6 Clicks" Mobility   Outcome Measure  Help needed turning from your back to your side while in a flat bed without using bedrails?: A Little Help needed moving from lying on your back to sitting on the side of a flat bed without using bedrails?: A Little Help needed moving to and from a bed to a chair (including a wheelchair)?: A Little Help needed standing up from a chair using your arms (e.g., wheelchair or bedside chair)?: A Little Help needed to walk in hospital room?: A Little Help needed climbing 3-5 steps with a railing? : Total 6 Click Score: 16    End of Session Equipment Utilized During Treatment: Gait belt Activity Tolerance: Patient tolerated treatment well Patient left: with call bell/phone within reach;in chair Nurse Communication: Mobility status PT Visit Diagnosis: History of falling (Z91.81);Other abnormalities of gait and mobility (R26.89);Difficulty in walking, not elsewhere classified (R26.2);Pain Pain - Right/Left: Left Pain - part of body: Ankle and joints of foot     Time: 2993-7169 PT Time Calculation (min) (ACUTE ONLY): 28 min  Charges:  $Gait Training: 23-37 mins                     [facility] [place] 05/04/2022, 12:25 PM

## 2022-05-04 NOTE — Progress Notes (Signed)
Patient removed her iv when this RN went to change the dressing and she does not want to have another iv as she would get discharge today.MD notified Also patient states she  is felling good health wise and is independent to go to the bathroom so she wants to get discharge home

## 2022-05-04 NOTE — Progress Notes (Addendum)
  Progress Note    05/04/2022 7:39 AM * No surgery found *  Subjective:  no changes with BLE weakness overnight   Vitals:   05/04/22 0000 05/04/22 0429  BP:  112/61  Pulse:  73  Resp:  15  Temp: 97.8 F (36.6 C) 98.2 F (36.8 C)  SpO2:  94%   Physical Exam: Lungs:  non labored Extremities:  feet warm; absent pedal pulses; pitting edema to mid shin with pigmentation changes Neurologic: A&O  CBC    Component Value Date/Time   WBC 7.3 05/01/2022 0051   RBC 3.74 (L) 05/01/2022 0051   HGB 10.6 (L) 05/01/2022 0051   HGB 12.4 10/10/2021 1047   HCT 32.9 (L) 05/01/2022 0051   HCT 37.6 10/10/2021 1047   PLT 223 05/01/2022 0051   PLT 162 10/10/2021 1047   MCV 88.0 05/01/2022 0051   MCV 87 10/10/2021 1047   MCH 28.3 05/01/2022 0051   MCHC 32.2 05/01/2022 0051   RDW 14.8 05/01/2022 0051   RDW 12.4 10/10/2021 1047   LYMPHSABS 1.0 03/30/2022 0939   LYMPHSABS 1.1 10/10/2021 1047   MONOABS 0.5 03/30/2022 0939   EOSABS 0.2 03/30/2022 0939   EOSABS 0.6 (H) 10/10/2021 1047   BASOSABS 0.0 03/30/2022 0939   BASOSABS 0.1 10/10/2021 1047    BMET    Component Value Date/Time   NA 138 05/04/2022 0155   NA 142 10/10/2021 1047   K 3.9 05/04/2022 0155   CL 96 (L) 05/04/2022 0155   CO2 31 05/04/2022 0155   GLUCOSE 139 (H) 05/04/2022 0155   BUN 68 (H) 05/04/2022 0155   BUN 28 (H) 10/10/2021 1047   CREATININE 1.53 (H) 05/04/2022 0155   CREATININE 1.32 (H) 11/02/2020 0859   CALCIUM 8.8 (L) 05/04/2022 0155   GFRNONAA 37 (L) 05/04/2022 0155   GFRAA 57 (L) 03/16/2020 1435    INR    Component Value Date/Time   INR 1.1 12/30/2021 1311     Intake/Output Summary (Last 24 hours) at 05/04/2022 0739 Last data filed at 05/04/2022 0431 Gross per 24 hour  Intake 240 ml  Output 1150 ml  Net -910 ml     Assessment/Plan:  67 y.o. female with BLE numbness; absent pedal pulses  Feet warm despite absent pedal pulses ABIs pending Etiology of BLE weakness unlikely arterial  insufficiency given recent lumbar compression fracture She can be followed in the outpatient setting if discharged today   Dagoberto Ligas, PA-C [facility] [phone] 05/04/2022 7:39 AM  I agree with the above.  ABIs show moderate disease with severe foot disease.  This can be followed up in the outpatient.  No surgical intervention is planned at this time.  Annamarie Major

## 2022-05-04 NOTE — Progress Notes (Signed)
Rounding Note    Patient Name: Cynthia Cook Date of Encounter: 05/04/2022  [place] Cardiologist: Buford Dresser, MD   Subjective   Feels like she is improving daily, almost back to baseline. Was able to walk to the bathroom yesterday. Planning for SNF after discharge for a short time. Tolerating all current medications. No bleeding. Still with parasthesias on the base of her feet but otherwise no new concerns today.  Inpatient Medications    Scheduled Meds:  apixaban  10 mg Oral BID   Followed by   Derrill Memo ON 05/08/2022] apixaban  5 mg Oral BID   bisoprolol  5 mg Oral Daily   empagliflozin  10 mg Oral Daily   levothyroxine  50 mcg Oral Q M,W,F   levothyroxine  75 mcg Oral Once per day on Sun Tue Thu Sat   lidocaine  1 patch Transdermal Q24H   predniSONE  50 mg Oral Q breakfast   senna-docusate  1 tablet Oral QHS   sodium chloride flush  3 mL Intravenous Q12H   traMADol  25 mg Oral Q6H   Continuous Infusions:   PRN Meds: acetaminophen **OR** acetaminophen, albuterol, Glycerin (Adult), morphine injection, ondansetron **OR** ondansetron (ZOFRAN) IV   Vital Signs    Vitals:   05/03/22 1956 05/04/22 0000 05/04/22 0429 05/04/22 0816  BP: 126/86  112/61 116/77  Pulse: 87  73 80  Resp: '20  15 20  '$ Temp: 97.8 F (36.6 C) 97.8 F (36.6 C) 98.2 F (36.8 C) 97.6 F (36.4 C)  TempSrc: Oral Oral Oral Oral  SpO2: 92%  94% 95%  Weight:   86.5 kg   Height:        Intake/Output Summary (Last 24 hours) at 05/04/2022 0933 Last data filed at 05/04/2022 0730 Gross per 24 hour  Intake 480 ml  Output 1150 ml  Net -670 ml      05/04/2022    4:29 AM 05/03/2022    4:37 AM 05/02/2022    3:34 AM  Last 3 Weights  Weight (lbs) 190 lb 11.2 oz 194 lb 14.2 oz 195 lb 12.3 oz  Weight (kg) 86.5 kg 88.4 kg 88.8 kg      Telemetry    Atrial fibrillation, rates 70s-90s - Personally Reviewed  ECG    No new since 04/29/22 - Personally Reviewed  Physical Exam    GEN: Well nourished, well developed in no acute distress NECK: No JVD CARDIAC: irregularly irregular rhythm, normal S1 and S2, no rubs or gallops. No murmur. VASCULAR: Radial pulses 2+ bilaterally.  RESPIRATORY:  Clear to auscultation without rales, wheezing or rhonchi  ABDOMEN: Soft, non-tender, non-distended MUSCULOSKELETAL:  Moves all 4 limbs independently SKIN: Warm and dry, trivial edema. Chronic skin changes. NEUROLOGIC:  No focal neuro deficits noted. PSYCHIATRIC:  Normal affect     Labs    High Sensitivity Troponin:   Recent Labs  Lab 04/29/22 0639 04/29/22 0849  TROPONINIHS 24* 24*     Chemistry Recent Labs  Lab 04/29/22 0639 04/30/22 0124 04/30/22 1442 05/02/22 0319 05/03/22 0329 05/04/22 0155  NA 142 139   < > 141 139 138  K 4.4 5.2*   < > 4.5 3.8 3.9  CL 105 104   < > 98 98 96*  CO2 26 25   < > 33* 32 31  GLUCOSE 96 121*   < > 111* 168* 139*  BUN 46* 44*   < > 64* 66* 68*  CREATININE 1.37* 1.48*   < >  1.56* 1.45* 1.53*  CALCIUM 9.1 9.0   < > 8.7* 9.0 8.8*  MG  --  2.1  --   --   --   --   PROT 6.8  --   --   --   --   --   ALBUMIN 3.0*  --   --   --   --   --   AST 24  --   --   --   --   --   ALT 23  --   --   --   --   --   ALKPHOS 66  --   --   --   --   --   BILITOT 0.7  --   --   --   --   --   GFRNONAA 42* 38*   < > 36* 39* 37*  ANIONGAP 11 10   < > '10 9 11   '$ < > = values in this interval not displayed.    Lipids No results for input(s): "CHOL", "TRIG", "HDL", "LABVLDL", "LDLCALC", "CHOLHDL" in the last 168 hours.  Hematology Recent Labs  Lab 04/29/22 0639 04/30/22 0124 05/01/22 0051  WBC 6.1 9.0 7.3  RBC 3.85* 4.03 3.74*  HGB 10.7* 11.4* 10.6*  HCT 34.9* 35.1* 32.9*  MCV 90.6 87.1 88.0  MCH 27.8 28.3 28.3  MCHC 30.7 32.5 32.2  RDW 14.9 14.8 14.8  PLT 257 218 223   Thyroid  Recent Labs  Lab 04/29/22 0849  TSH 4.812*    BNP Recent Labs  Lab 04/29/22 0639  BNP 580.5*    DDimer No results for input(s): "DDIMER" in the  last 168 hours.   Radiology    No results found.  Cardiac Studies   Echo 04/30/22  1. Left ventricular ejection fraction, by estimation, is 30 to 35%. The  left ventricle has moderately decreased function. The left ventricle  demonstrates global hypokinesis. There is moderate left ventricular  hypertrophy. Left ventricular diastolic  parameters are indeterminate.   2. Right ventricular systolic function is moderately reduced. The right  ventricular size is mildly enlarged. Tricuspid regurgitation signal is  inadequate for assessing PA pressure.   3. Left atrial size was severely dilated.   4. Right atrial size was severely dilated.   5. The mitral valve is degenerative. Mild to moderate mitral valve  regurgitation. Moderate mitral stenosis. The mean mitral valve gradient is  8.0 mmHg with average heart rate of 102 bpm. Severe mitral annular  calcification. MVA 1.5 cm^2 by continuity  equation   6. The aortic valve is calcified. There is moderate calcification of the  aortic valve. Aortic valve regurgitation is trivial. Moderate aortic valve  stenosis. Mild AS by gradients (Vmax 2.1 m/s, MG 42mHg), moderate by AVA  (1.2 cm^2) and DI (0.4).  Suspect low flow low gradient moderate AS   7. The inferior vena cava is dilated in size with <50% respiratory  variability, suggesting right atrial pressure of 15 mmHg.   Patient Profile     67y.o. female with PMH atrial fibrillation, CVA, remote PE presenting with bilateral LE edema and shortness of breath. Found to have submassive PE and new biventricular heart failure.  Assessment & Plan    Paroxysmal atrial fibrillation, presenting with RVR History of CVA (per neuro, felt to be small vessel disease) -CHA2DS2/VAS Stroke Risk Points=7 -see comments in note from 05/01/22. We have had extensive conversations about anticoagulation in the past. She is amenable to  apixaban at least for the next few months, would prefer lifelong dosing. We  briefly discussed Watchman but reviewed that this would not prevent recurrent PE. She believes she will be able to afford the apixaban and is willing to continue for now.  Acute systolic and diastolic heart failure Biventricular failure Chronic kidney disease stage 3a Valvular heart disease -limited echo shows LVEF 30-35%, RV with moderately reduced function, severe biatrial enlargement, mild-moderate MR, moderate MS (at HR 102 bpm), moderate AS -prior echo 12/2021 with preserved EF -Blood pressure is low, limits guideline directed medical therapy -admission weight 85.4 kg (suspect inaccurate), peak weight 88.8 kg, weight today 86.5 kg, net negative 3.9 L.  -Cr within recent range of 1.53 today -we discussed ischemic evaluation. She does not want cath, does not want stents or bypass surgery. She would like to work on medical management first -does not tolerate metoprolol. No BP room for ARB/ARNI.  -tolerating 5 mg bisoprolol daily. Tolerating Jardiance. Continue both at discharge.  Submassive PE -recurrent, with prior PE >15 years ago in the setting of travel. She does note minimal activity over the last month due to pain from prior fall and lumbar fracture -elevated troponins 2/2 strain from PE vs acute heart failure  Coronary calcification Hypercholesterolemia Aortic atherosclerosis -declines statins -was on ezetimibe prior to admission -LDL goal <70  Cavour will sign off.   Medication Recommendations:  Continue apixaban (PE dosing short term, long term dosing of 5 mg BID), bisoprolol 5 mg daily, jardiance 10 mg daily. Do not restart PTA clopidogrel and metprolol. Restart ezetimibe at discharge Other recommendations (labs, testing, etc):  BMET at follow up Follow up as an outpatient:  We will arrange for outpatient follow up with myself or Laurann Montana at our [place] office   For questions or updates, please contact [place] Please consult  [email or web address] for contact info under     Signed, Buford Dresser, MD  05/04/2022, 9:33 AM

## 2022-05-04 NOTE — Progress Notes (Signed)
Heart Failure Nurse Navigator Progress Note   Will come back to complete HF navigator interview and schedule Hf TOC appoinment. patient now going  downstairs for her ABI study.   Earnestine Leys, BSN, Clinical cytogeneticist Only

## 2022-05-04 NOTE — Progress Notes (Signed)
PROGRESS NOTE    Cynthia Cook  UEA:[id number] DOB: 1956/03/02 DOA: 04/29/2022 PCP: Lurline Del, DO  Chief Complaint  Patient presents with   Lower legs decreased sensation     Brief Narrative:  Cynthia Cook is Cynthia Cook 67 y.o. female with medical history significant of paroxysmal atrial fibrillation not on anticoagulation, history of pulmonary embolism not on anticoagulation, history of CVA, subdural hematoma, hypothyroidism, and chronic back pain who presents with complaints of decreased sensation in her lower legs.  She states that she had Cynthia Cook fall on December 14 and had been diagnosed with Cynthia Cook compression fracture of her lumbar spine.  Ever since that time she has had paresthesias in her feet and unable to walk in Cynthia Cook straight line or stand due to having severe pain. Patient had been diagnosed with RSV 03/30/2022.  Since that time she had Cynthia Cook progressive decline and has not been doing well or able to care for self.  Patient states that her dog had died and so she is at home alone.  Neighbors have been bringing food to her that is not the healthiest for which she noticed that she had started to have lower extremity swelling over the last several days.  She has Cynthia Cook history of atrial fibrillation and previously had been on Eliquis, but had not been able to afford this medication to continue it.  She reports not being able to tolerate Coumadin due to bleeding and has been taking Cynthia Cook supplement to thin her blood instead.  She denies having any chest pain or palpitations, but has felt more short of breath.  Patient patient is not on any blood pressure medication and states that normally her blood pressures and heart rates are well-controlled. She had not been able to follow-up with anyone and states she is felt like she is falling through the cracks.   Review of records note that patient had been admitted with dysarthria found to have an acute stroke with what was thought to be chronic bilateral subdural  hematomas back in 12/2021 where she was advised to need to be on anticoagulation and declined at that time for which she was told that she would likely have recurrent strokes could be like threatening and disabling.   Assessment & Plan:   Principal Problem:   Pulmonary embolism (HCC) Active Problems:   CHF (congestive heart failure) (HCC)   Atrial fibrillation (HCC)   History of pulmonary embolism   Elevated troponin I level   Lumbar compression fracture (HCC)   Normocytic anemia   CKD (chronic kidney disease) stage 3, GFR 30-59 ml/min (HCC) - baseline SCr 1.3   Subdural hematoma (HCC)   History of CVA (cerebrovascular accident)   Hypothyroidism   Acute combined systolic and diastolic heart failure (HCC)   Paroxysmal atrial fibrillation with RVR ([place])   Gout flare   # Pulmonary embolism # Hypoxic respiratory failure Acute, severe right-sided PE on CTA, evidence right heart strain. Hemodynamically stable and relatively asymptomatic. PVL initial read negative for DVT. Hx of PE in past, not anticoagulated at time of presentation. Risk factors include recent illness (rsv) and relative immobility 2/2 recent fall with lumbar compression fracture.  - converted from heparin to apixaban on 1/22 - on RA today   # HFrEF 2/2 PE. TTE with EF 30-35, moderately reduced RV function. Likely Brailon Don consequence of PE, Junior Huezo-fib.  - cardiology following - bisoprolol started 1/22.  Jardiance.  No BP room for ARB/ARNI.  Apparently discussed ischemic evaluation with cards (doesn't want  cath, doesn't want stents or bypass, wants to work on medical management first).  See cards sign off from 1/25.   # Maverick Dieudonne-fib with RVR - chads2vasc is 7. - bisoprolol as above - apixaban as above   # Peripheral Vascular Disease  Mottled Bilateral Lower Extremities - difficult to appreciate pedal pulses, RN was able to doppler pulse - ABI's with moderate bilateral lower extremity arterial disease and absent toe brachial indices  bilaterally    - appreciate vascular assistance  - no surgical intervention planned, planning for outpatient follow up  # Lumbar compression fracture Patient reports recent fall evaluated at [facility] with mri which showed lumbar compression fracture MRI showed compression fracture along superior endplate of Z61 and L5 without retropulsion of bone.  Chronic benign compression gracture evident at L2 with retropulsion of bone along the superior endplate contributing to mild central canal stenosis.  Overall fractures are generally benign in appearance, likely 2/2 osteoporosis or trauma.  Mild lumbar region DDD without evidence of discu herniation.  Grade 1 spondylolisthesis at L4-5 and mild lumbar curvature. (Obtained via verbal report from [facility], please obtain report from [facility] for definitive report) - pain control - outpt ortho f/u pending with Dr. Orland Penman - pt/ot consulted, advising SNF, patient desires this, TOC consulted - given chronicity of change in sensation from lower legs, I don't think we need Jaisen Wiltrout repeat MRI at this time   # Gout with flare Patient reported onset of typical gout pain left ankle 1/23 similar to prior flares. Says her flares don't typically involve redness but the ankle is quite tender to touch/movement. Says prednisone tapers are what works best for her. - currently on prednisone 50 qd   # CKD 3b Stable - monitor   # Hx CVA - home plavix discontinued; anticoagulation as above - not on statin due to patient preference   # Hypothyroid - TSH mildly elevated - cont home levothyroxine, will need outpt repeat of tsh      DVT prophylaxis: eliquis Code Status: full Family Communication: none Disposition:   Status is: Inpatient Remains inpatient appropriate because: continued inpatient workup needed   Consultants:  Cardiology   Procedures:  Echo IMPRESSIONS     1. Left ventricular ejection fraction, by estimation, is 30 to 35%. The  left  ventricle has moderately decreased function. The left ventricle  demonstrates global hypokinesis. There is moderate left ventricular  hypertrophy. Left ventricular diastolic  parameters are indeterminate.   2. Right ventricular systolic function is moderately reduced. The right  ventricular size is mildly enlarged. Tricuspid regurgitation signal is  inadequate for assessing PA pressure.   3. Left atrial size was severely dilated.   4. Right atrial size was severely dilated.   5. The mitral valve is degenerative. Mild to moderate mitral valve  regurgitation. Moderate mitral stenosis. The mean mitral valve gradient is  8.0 mmHg with average heart rate of 102 bpm. Severe mitral annular  calcification. MVA 1.5 cm^2 by continuity  equation   6. The aortic valve is calcified. There is moderate calcification of the  aortic valve. Aortic valve regurgitation is trivial. Moderate aortic valve  stenosis. Mild AS by gradients (Vmax 2.1 m/s, MG 64mHg), moderate by AVA  (1.2 cm^2) and DI (0.4).  Suspect low flow low gradient moderate AS   7. The inferior vena cava is dilated in size with <50% respiratory  variability, suggesting right atrial pressure of 15 mmHg.   LE [place]:  RIGHT:  -  There is no evidence of deep vein thrombosis in the lower extremity.    - No cystic structure found in the popliteal fossa.    LEFT:  - There is no evidence of deep vein thrombosis in the lower extremity.    - Cynthia Gougeon cystic structure is found in the popliteal fossa.   Antimicrobials:  Anti-infectives (From admission, onward)    None       Subjective: No new complaints today  Objective: Vitals:   05/04/22 0429 05/04/22 0816 05/04/22 1107 05/04/22 1723  BP: 112/61 116/77 114/82 (!) 140/97  Pulse: 73 80  88  Resp: '15 20 13 19  '$ Temp: 98.2 F (36.8 C) 97.6 F (36.4 C)  98.3 F (36.8 C)  TempSrc: Oral Oral  Oral  SpO2: 94% 95%  97%  Weight: 86.5 kg     Height:        Intake/Output Summary (Last  24 hours) at 05/04/2022 1737 Last data filed at 05/04/2022 1727 Gross per 24 hour  Intake 720 ml  Output 700 ml  Net 20 ml   Filed Weights   05/02/22 0334 05/03/22 0437 05/04/22 0429  Weight: 88.8 kg 88.4 kg 86.5 kg    Examination:  General: No acute distress. Cardiovascular: RRR Lungs: unlabored Abdomen: Soft, nontender, nondistended  Neurological: Alert and oriented 3. Moves all extremities 4 with equal strength. Cranial nerves II through XII grossly intact. Extremities: No clubbing or cyanosis. No edema.  Data Reviewed: I have personally reviewed following labs and imaging studies  CBC: Recent Labs  Lab 04/29/22 0639 04/30/22 0124 05/01/22 0051  WBC 6.1 9.0 7.3  HGB 10.7* 11.4* 10.6*  HCT 34.9* 35.1* 32.9*  MCV 90.6 87.1 88.0  PLT 257 218 229    Basic Metabolic Panel: Recent Labs  Lab 04/30/22 0124 04/30/22 1442 05/01/22 0051 05/02/22 0319 05/03/22 0329 05/04/22 0155  NA 139 140 139 141 139 138  K 5.2* 4.6 4.7 4.5 3.8 3.9  CL 104 103 101 98 98 96*  CO2 '25 26 31 '$ 33* 32 31  GLUCOSE 121* 130* 122* 111* 168* 139*  BUN 44* 45* 47* 64* 66* 68*  CREATININE 1.48* 1.61* 1.57* 1.56* 1.45* 1.53*  CALCIUM 9.0 8.8* 8.7* 8.7* 9.0 8.8*  MG 2.1  --   --   --   --   --     GFR: Estimated Creatinine Clearance: 38.5 mL/min (Cynthia Cook) (by C-G formula based on SCr of 1.53 mg/dL (H)).  Liver Function Tests: Recent Labs  Lab 04/29/22 0639  AST 24  ALT 23  ALKPHOS 66  BILITOT 0.7  PROT 6.8  ALBUMIN 3.0*    CBG: No results for input(s): "GLUCAP" in the last 168 hours.   Recent Results (from the past 240 hour(s))  Culture, blood (single)     Status: None   Collection Time: 04/29/22  6:40 AM   Specimen: BLOOD LEFT ARM  Result Value Ref Range Status   Specimen Description BLOOD LEFT ARM  Final   Special Requests   Final    BOTTLES DRAWN AEROBIC AND ANAEROBIC Blood Culture adequate volume   Culture   Final    NO GROWTH 5 DAYS Performed at [facility],  [address], [place], [place] [address]    Report Status 05/04/2022 FINAL  Final         Radiology Studies: VAS [place] ABI WITH/WO TBI  Result Date: 05/04/2022  LOWER EXTREMITY DOPPLER STUDY Patient Name:  Cynthia Cook  Date of Exam:  05/04/2022 Medical Rec #: [id number]       Accession #:    [id number] Date of Birth: 1955/05/05        Patient Gender: F Patient Age:   48 years Exam Location:  [facility] Procedure:      VAS [place] ABI WITH/WO TBI Referring Phys: Cynthia Cook --------------------------------------------------------------------------------  Indications: Diminished pulses High Risk Factors: No history of smoking, prior CVA. Other Factors: Afib, CKD, CHF.  Limitations: Today's exam was limited due to Persistent Afib. Comparison Study: No previous exams Performing Technologist: Hill, Jody RVT, RDMS  Examination Guidelines: Cynthia Cook complete evaluation includes at minimum, Doppler waveform signals and systolic blood pressure reading at the level of bilateral brachial, anterior tibial, and posterior tibial arteries, when vessel segments are accessible. Bilateral testing is considered an integral part of Cynthia Cook complete examination. Photoelectric Plethysmograph (PPG) waveforms and toe systolic pressure readings are included as required and additional duplex testing as needed. Limited examinations for reoccurring indications may be performed as noted.  ABI Findings: +---------+------------------+-----+-----------------+-------------------------+ Right    Rt Pressure (mmHg)IndexWaveform         Comment                   +---------+------------------+-----+-----------------+-------------------------+ Brachial 146                    triphasic                                  +---------+------------------+-----+-----------------+-------------------------+ PTA      118               0.81 monophasic                                  +---------+------------------+-----+-----------------+-------------------------+ DP       116               0.79 monophasic                                 +---------+------------------+-----+-----------------+-------------------------+ Great Toe0                 0.00 severely abnormalseverely damped waveform                                                   w/ absent pressure        +---------+------------------+-----+-----------------+-------------------------+ +---------+------------------+-----+----------+-------+ Left     Lt Pressure (mmHg)IndexWaveform  Comment +---------+------------------+-----+----------+-------+ Brachial 135                    triphasic         +---------+------------------+-----+----------+-------+ PTA      112               0.77 monophasic        +---------+------------------+-----+----------+-------+ DP       102               0.70 monophasic        +---------+------------------+-----+----------+-------+ Great Toe0                 0.00 Absent            +---------+------------------+-----+----------+-------+ +-------+-----------+-----------+------------+------------+  ABI/TBIToday's ABIToday's TBIPrevious ABIPrevious TBI +-------+-----------+-----------+------------+------------+ Right  0.81       Absent                              +-------+-----------+-----------+------------+------------+ Left   0.77       Absent                              +-------+-----------+-----------+------------+------------+  Summary: Right: Resting right ankle-brachial index indicates moderate right lower extremity arterial disease. The right toe-brachial index is absent. Left: Resting left ankle-brachial index indicates moderate left lower extremity arterial disease. The left toe-brachial index is absent. *See table(s) above for measurements and observations.  Electronically signed by Monica Martinez MD on 05/04/2022 at 2:27:26 PM.    Final          Scheduled Meds:  apixaban  10 mg Oral BID   Followed by   Derrill Memo ON 05/08/2022] apixaban  5 mg Oral BID   bisoprolol  5 mg Oral Daily   empagliflozin  10 mg Oral Daily   levothyroxine  50 mcg Oral Q M,W,F   levothyroxine  75 mcg Oral Once per day on Sun Tue Thu Sat   lidocaine  1 patch Transdermal Q24H   predniSONE  50 mg Oral Q breakfast   senna-docusate  1 tablet Oral QHS   sodium chloride flush  3 mL Intravenous Q12H   traMADol  25 mg Oral Q6H   Continuous Infusions:   LOS: 5 days    Time spent: over 30 min    Fayrene Helper, MD [facility]   To contact the attending provider between 7A-7P or the covering provider during after hours 7P-7A, please log into the web site [email or web address] and access using universal [place] password for that web site. If you do not have the password, please call the hospital operator.  05/04/2022, 5:37 PM

## 2022-05-04 NOTE — Progress Notes (Signed)
Occupational Therapy Treatment Patient Details Name: Cynthia Cook MRN: [id number] DOB: Aug 04, 1955 Today's Date: 05/04/2022   History of present illness Pt is a 67 y/o female presenting on 1/20 with decreased sensation in [place], SOB. Pt with recent fall 03/23/22 with compression fx, 03/30/22 RSV. Found to be in afib RVR. Chest xray with bilateral pleural effusions, CT angiogram with R sided PE with mild R sided heart strain. PMH includes: afib, PE, CVA, subdural hematoma, chronic back pain, gout, syncope.   OT comments  Patient seated in recliner, agreeable to OT.  Reviewed back precautions for comfort, pt educated on techniques for LB dressing using AE. Min assist to manage sock aide/reacher.  Declines further mobility as pt is fatigued from busy morning.  Will follow acutely, continue to recommend SNF.    Recommendations for follow up therapy are one component of a multi-disciplinary discharge planning process, led by the attending physician.  Recommendations may be updated based on patient status, additional functional criteria and insurance authorization.    Follow Up Recommendations  Skilled nursing-short term rehab (<3 hours/day)     Assistance Recommended at Discharge Frequent or constant Supervision/Assistance  Patient can return home with the following      Equipment Recommendations  BSC/3in1    Recommendations for Other Services      Precautions / Restrictions Precautions Precautions: Fall;Back Precaution Booklet Issued: No Precaution Comments: recent compression fx Restrictions Weight Bearing Restrictions: No       Mobility Bed Mobility               General bed mobility comments: Pt up in chair    Transfers                         Balance Overall balance assessment: Needs assistance Sitting-balance support: No upper extremity supported, Feet supported Sitting balance-Leahy Scale: Good                                     ADL  either performed or assessed with clinical judgement   ADL Overall ADL's : Needs assistance/impaired                     Lower Body Dressing: Minimal assistance;Sitting/lateral leans;With adaptive equipment Lower Body Dressing Details (indicate cue type and reason): educated on AE for LB dressing, pt practicing with sock aide/reacher given min assist               General ADL Comments: limited session, pt declined mobility as just finished with PT.    Extremity/Trunk Assessment              Vision       Perception     Praxis      Cognition Arousal/Alertness: Awake/alert Behavior During Therapy: WFL for tasks assessed/performed Overall Cognitive Status: Within Functional Limits for tasks assessed                                          Exercises      Shoulder Instructions       General Comments VSS.  Pt reports tingling around lips and asking therapist to leave. Pt declines needing further assist from OT.  RN notified and checked on pt.    Pertinent Vitals/ Pain  Pain Assessment Pain Assessment: No/denies pain  Home Living                                          Prior Functioning/Environment              Frequency  Min 2X/week        Progress Toward Goals  OT Goals(current goals can now be found in the care plan section)  Progress towards OT goals: Progressing toward goals  Acute Rehab OT Goals Patient Stated Goal: get better OT Goal Formulation: With patient Time For Goal Achievement: 05/15/22 Potential to Achieve Goals: Good  Plan Discharge plan remains appropriate;Frequency remains appropriate    Co-evaluation                 AM-PAC OT "6 Clicks" Daily Activity     Outcome Measure   Help from another person eating meals?: None Help from another person taking care of personal grooming?: A Little Help from another person toileting, which includes using toliet, bedpan, or  urinal?: A Little Help from another person bathing (including washing, rinsing, drying)?: A Lot Help from another person to put on and taking off regular upper body clothing?: A Little Help from another person to put on and taking off regular lower body clothing?: A Little 6 Click Score: 18    End of Session    OT Visit Diagnosis: Other abnormalities of gait and mobility (R26.89);Muscle weakness (generalized) (M62.81);Pain;History of falling (Z91.81) Pain - part of body:  (back)   Activity Tolerance Patient tolerated treatment well   Patient Left in chair;with call bell/phone within reach   Nurse Communication Mobility status;Other (comment) (tingling around lips)        Time: 1038-1100 OT Time Calculation (min): 22 min  Charges: OT General Charges $OT Visit: 1 Visit OT Treatments $Self Care/Home Management : 8-22 mins  Jolaine Artist, [place] Office [phone]   Delight Stare 05/04/2022, 1:39 PM

## 2022-05-04 NOTE — Consult Note (Signed)
[facility] Nurse Consult Note: Reason for Consult: Consult requested for bilat buttocks. Wound type: Left and right buttocks with Stage 2 pressure injuries; each is .5X.5X.1cm, red and moist.  Skin to surrounding buttocks/sacrum is  dark red; appearance is consistent with chronic tissue injury, Pt states she spends a large amt time in a recliner prior to admission.  Pressure Injury POA: No Dressing procedure/placement/frequency: Topical treatment orders provided for bedside nurses to perform as follows to protect and promote healing: Foam dressing to sacrum/bilat buttocks, change Q 3 days or PRN soiling. Please re-consult if further assistance is needed.  Thank-you,  Julien Girt MSN, [place], [place], [place], [place]

## 2022-05-04 NOTE — Progress Notes (Signed)
   Heart Failure Stewardship Pharmacist Progress Note   PCP: Lurline Del, DO PCP-Cardiologist: Buford Dresser, MD    HPI:  67 yo F with PMH of afib, PE, CVA, SDH, hypothyroidism, and chronic back pain.   She presented to the ED on 1/20 with LE weakness, edema, and shortness of breath. Recently had a fall and found to have compression fracture of lumbar spine, then had RSV and PNA. Has had difficulty caring for herself. CXR with small bilateral pleural effusion. CTA with large right sided PE with mild right heart strain. ECHO 1/21 with LVEF 30-35% (was 55-60% 12/2021), global hypokinesis, RV moderately reduced.   Current HF Medications: Beta Blocker: bisoprolol 5 mg daily SGLT2i: Jardiance 10 mg daily  Prior to admission HF Medications: None  Pertinent Lab Values: Serum creatinine 1.53, BUN 68, Potassium 3.9, Sodium 138, BNP 580.5, Magnesium 2.1  Vital Signs: Weight: 190 lbs (admission weight: 188 lbs) Blood pressure: 110/70s  Heart rate: 70-90s  I/O: -0.4L yesterday; net -3.6L  Medication Assistance / Insurance Benefits Check: Does the patient have prescription insurance?  Yes Type of insurance plan: [employer]  Outpatient Pharmacy:  [facility] Is the patient willing to use [place] at discharge? Yes Is the patient willing to transition their outpatient pharmacy to utilize a [facility] outpatient pharmacy?   Pending    Assessment: 1. Acute on chronic systolic CHF (LVEF 59-56%). NYHA class II symptoms. - No edema or JVD on exam. Off IV lasix. Need to be careful with diuresing with right heart strain. May need PRN lasix for discharge since she'll be monitored closely at SNF. Strict I/Os and daily weights. Keep K>4 and Mg>2. - Continue bisoprolol 5 mg daily. Did not tolerate metoprolol in the past. - BP too soft for ARB/MRA at this time. She is at increased fall risk. - Continue Jardiance 10 mg daily   Plan: 1)  Medication changes recommended at this time: - Add low dose PRN lasix for discharge  2) Patient assistance: - Jardiance copay $47 - SNF rehab at discharge  3)  Education  - Patient has been educated on current HF medications and potential additions to HF medication regimen - Patient verbalizes understanding that over the next few months, these medication doses may change and more medications may be added to optimize HF regimen - Patient has been educated on basic disease state pathophysiology and goals of therapy   Kerby Nora, PharmD, BCPS Heart Failure Stewardship Pharmacist Phone [phone]

## 2022-05-04 NOTE — Progress Notes (Signed)
ABI exam has been completed.   Results can be found under chart review under CV PROC. 05/04/2022 12:35 PM Auriella Wieand RVT, RDMS

## 2022-05-04 NOTE — Progress Notes (Signed)
Heart Failure Nurse Navigator Progress Note  PCP: Lurline Del, DO PCP-Cardiologist: Harrell Gave Admission Diagnosis: Acute congestive heart failure, Atrial fibrillation Admitted from: Home via EMS  Presentation:   Cynthia Cook presented with lower legs decreased sensation, leg swelling, weakness, shortness of breath, patient had fallen at home in December 2023 which resulted in a L2 compression fracture, then had RSV and PNA. Patient has been using a cane at home for mobility and has had food delivered to her from her church family since her fall. BP 112/68, HR 113, BNP 580.5, Troponin 24, EKG : showed atrial fibrillation, IV lasix given, CXR with small bilateral pleural effusion. CTA with large right sided PE with mild right heart strain .   Patient was educated on the sing and symptoms of heart failure, daily weights, when to call her doctor or go the ED, diet/ fluid restrictions, taking all medications as prescribed and attending all medical appointments. Patient verbalized her understanding and is very interested in coming for a HF TOC follow up appointment on 05/22/2022 @  12 noon.   ECHO/ LVEF: 30-35%  Clinical Course:  Past Medical History:  Diagnosis Date   Back pain    Gout    Murmur    PE (pulmonary embolism)    Persistent atrial fibrillation (HCC)    Pulmonary emboli (HCC) 02/05/2020   Syncope    Thyroid disease      Social History   Socioeconomic History   Marital status: Divorced    Spouse name: Not on file   Number of children: 0   Years of education: Not on file   Highest education level: Bachelor's degree (e.g., BA, AB, BS)  Occupational History    Comment: retired Therapist, sports  Tobacco Use   Smoking status: Never   Smokeless tobacco: Never  Vaping Use   Vaping Use: Never used  Substance and Sexual Activity   Alcohol use: No   Drug use: Never   Sexual activity: Not on file  Other Topics Concern   Not on file  Social History Narrative   Lives with her dog,  she has no family members. 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   Social Determinants of Health   Financial Resource Strain: Low Risk  (05/04/2022)   Overall Financial Resource Strain (CARDIA)    Difficulty of Paying Living Expenses: Not very hard  Food Insecurity: No Food Insecurity (05/04/2022)   Hunger Vital Sign    Worried About Running Out of Food in the Last Year: Never true    Ran Out of Food in the Last Year: Never true  Transportation Needs: No Transportation Needs (05/04/2022)   PRAPARE - Hydrologist (Medical): No    Lack of Transportation (Non-Medical): No  Physical Activity: Not on file  Stress: Stress Concern Present (06/14/2021)   [place]    Feeling of Stress : Very much  Social Connections: Not on file   Education Assessment and Provision:  Detailed education and instructions provided on heart failure disease management including the following:  Signs and symptoms of Heart Failure When to call the physician Importance of daily weights Low sodium diet Fluid restriction Medication management Anticipated future follow-up appointments  Patient education given on each of the above topics.  Patient acknowledges understanding via teach back method and acceptance of all instructions.  Education Materials:  "Living Better With Heart Failure" Booklet, HF zone tool, & Daily Weight  Tracker Tool.  Patient has scale at home: yes Patient has pill box at home: yes    High Risk Criteria for Readmission and/or Poor Patient Outcomes: Heart failure hospital admissions (last 6 months): 1  No Show rate: 2 % Difficult social situation: No Demonstrates medication adherence: Yes Primary Language: English Literacy level: Reading, writing, and comprehension ( Retired Therapist, sports)  Barriers of Care:   Continued HF education Patient declined heart cath, unsure about using GDMT  therapy  Diet/ fluid: reported church family has been delivering her food since her fall in December)  Considerations/Referrals:   Referral made to Heart Failure Pharmacist Stewardship: Yes Referral made to Heart Failure CSW/NCM TOC: Yes, transportation, [employer] doesn't cover , may still be in rehab at appt time.  Referral made to Heart & Vascular TOC clinic: Yes, 05/22/2022 @ 12 noon  Items for Follow-up on DC/TOC: Continued Hf education / GDMT therapy goals Diet/ fluids: reported church family has been delivering her food since December when she had her fall.     Earnestine Leys, BSN, Clinical cytogeneticist Only

## 2022-05-04 NOTE — TOC Progression Note (Signed)
Transition of Care [facility]) - Progression Note    Patient Details  Name: Cynthia Cook MRN: [id number] Date of Birth: May 14, 1955  Transition of Care [facility]) CM/SW [place], [place] Phone Number: 05/04/2022, 8:39 AM  Clinical Narrative:     8:30am- CSW called patient- she believed she had to get transportation to and from the SNF. Also, she states she has no clothes and wanted to go home before going to SNF. CSW explained the SNF process and that transportation will be provided from the hospital to SNF. CSW explained received call from [place], that expressed concerns about her returning home verses rehab. APS is concerns about her safety and agrees she needs get stronger before returning home. Patient states understanding & after addressing her concerns, she is agreeable to SNF at [facility].   6:31 am- received text message form patient, expressing she wants to go home verses SNF.   CSW contacted [facility]- waiting on response.   Thurmond Butts, MSW, LCSW Clinical Social Worker    Expected Discharge Plan: Skilled Nursing Facility Barriers to Discharge: Insurance Authorization (confirmed bed offer)  Expected Discharge Plan and Services In-house Referral: Clinical Social Work Discharge Planning Services: CM Consult Post Acute Care Choice: Durable Medical Equipment Living arrangements for the past 2 months: Apartment                           HH Arranged: PT, OT, Nurse's Aide, Social Work [employer] Agency: [employer]         Social Determinants of Health (SDOH) Interventions SDOH Screenings   Food Insecurity: No Food Insecurity (03/15/2020)  Transportation Needs: No Transportation Needs (06/14/2021)  Depression (PHQ2-9): Medium Risk (02/18/2021)  Financial Resource Strain: High Risk (06/14/2021)  Stress: Stress Concern Present (06/14/2021)  Tobacco Use: Low Risk  (05/02/2022)    Readmission Risk Interventions     No data to display

## 2022-05-04 NOTE — TOC Progression Note (Signed)
Transition of Care [facility]) - Progression Note    Patient Details  Name: Cynthia Cook MRN: [id number] Date of Birth: 10-Feb-1956  Transition of Care [facility]) CM/SW [place], [place] Phone Number: 05/04/2022, 2:27 PM  Clinical Narrative:     CMA Levada Dy- started insurance [place] for [facility] - reference # [id number]   [facility] will continue to follow and assist with discharge planning.  Thurmond Butts, MSW, LCSW Clinical Social Worker    Expected Discharge Plan: Skilled Nursing Facility Barriers to Discharge: Insurance Authorization (confirmed bed offer)  Expected Discharge Plan and Services In-house Referral: Clinical Social Work Discharge Planning Services: CM Consult Post Acute Care Choice: Durable Medical Equipment Living arrangements for the past 2 months: Apartment                           HH Arranged: PT, OT, Nurse's Aide, Social Work [employer] Agency: [employer]         Social Determinants of Health (SDOH) Interventions SDOH Screenings   Food Insecurity: No Food Insecurity (05/04/2022)  Housing: Low Risk  (05/04/2022)  Transportation Needs: No Transportation Needs (05/04/2022)  Utilities: Not At Risk (05/04/2022)  Alcohol Screen: Low Risk  (05/04/2022)  Depression (PHQ2-9): Medium Risk (02/18/2021)  Financial Resource Strain: Low Risk  (05/04/2022)  Stress: Stress Concern Present (06/14/2021)  Tobacco Use: Low Risk  (05/04/2022)    Readmission Risk Interventions     No data to display

## 2022-05-04 NOTE — Progress Notes (Signed)
TRH night cross cover note:   I was notified by RN that patient has lost IV access this morning, but that the plan is for the patient to be discharged later today, and that she is not currently due for any scheduled IV medications in the interval.  Consequently, I conveyed that it is okay to refrain from reestablishing peripheral IV access at this time.  Additionally, RN conveys that the patient would like to further discuss with her rounding hospitalist the disposition plan.  Specifically, patient conveys that she would like to discuss further her options regarding discharge to SNF versus discharge to home, conveying her preference to be discharged to home.   I conveyed to RN that I would forward the patient's desire to further discuss disposition planning with her day rounding hospitalist.     Babs Bertin, DO Hospitalist

## 2022-05-05 ENCOUNTER — Inpatient Hospital Stay (HOSPITAL_COMMUNITY): Payer: Medicare HMO

## 2022-05-05 ENCOUNTER — Other Ambulatory Visit (HOSPITAL_COMMUNITY): Payer: Self-pay

## 2022-05-05 ENCOUNTER — Encounter (HOSPITAL_BASED_OUTPATIENT_CLINIC_OR_DEPARTMENT_OTHER): Payer: Self-pay

## 2022-05-05 DIAGNOSIS — Z8673 Personal history of transient ischemic attack (TIA), and cerebral infarction without residual deficits: Secondary | ICD-10-CM | POA: Diagnosis not present

## 2022-05-05 DIAGNOSIS — R5383 Other fatigue: Secondary | ICD-10-CM | POA: Diagnosis not present

## 2022-05-05 DIAGNOSIS — J90 Pleural effusion, not elsewhere classified: Secondary | ICD-10-CM | POA: Diagnosis not present

## 2022-05-05 DIAGNOSIS — I5041 Acute combined systolic (congestive) and diastolic (congestive) heart failure: Secondary | ICD-10-CM | POA: Diagnosis not present

## 2022-05-05 DIAGNOSIS — M533 Sacrococcygeal disorders, not elsewhere classified: Secondary | ICD-10-CM | POA: Diagnosis not present

## 2022-05-05 DIAGNOSIS — I2609 Other pulmonary embolism with acute cor pulmonale: Secondary | ICD-10-CM | POA: Diagnosis not present

## 2022-05-05 DIAGNOSIS — Z885 Allergy status to narcotic agent status: Secondary | ICD-10-CM | POA: Diagnosis not present

## 2022-05-05 DIAGNOSIS — J811 Chronic pulmonary edema: Secondary | ICD-10-CM | POA: Diagnosis not present

## 2022-05-05 DIAGNOSIS — R0602 Shortness of breath: Secondary | ICD-10-CM | POA: Diagnosis not present

## 2022-05-05 DIAGNOSIS — Z7401 Bed confinement status: Secondary | ICD-10-CM | POA: Diagnosis not present

## 2022-05-05 DIAGNOSIS — S32000S Wedge compression fracture of unspecified lumbar vertebra, sequela: Secondary | ICD-10-CM | POA: Diagnosis not present

## 2022-05-05 DIAGNOSIS — R069 Unspecified abnormalities of breathing: Secondary | ICD-10-CM | POA: Diagnosis not present

## 2022-05-05 DIAGNOSIS — I509 Heart failure, unspecified: Secondary | ICD-10-CM | POA: Diagnosis not present

## 2022-05-05 DIAGNOSIS — D649 Anemia, unspecified: Secondary | ICD-10-CM | POA: Diagnosis not present

## 2022-05-05 DIAGNOSIS — E785 Hyperlipidemia, unspecified: Secondary | ICD-10-CM | POA: Diagnosis not present

## 2022-05-05 DIAGNOSIS — Z7901 Long term (current) use of anticoagulants: Secondary | ICD-10-CM | POA: Diagnosis not present

## 2022-05-05 DIAGNOSIS — Z86711 Personal history of pulmonary embolism: Secondary | ICD-10-CM | POA: Diagnosis not present

## 2022-05-05 DIAGNOSIS — J9600 Acute respiratory failure, unspecified whether with hypoxia or hypercapnia: Secondary | ICD-10-CM | POA: Diagnosis not present

## 2022-05-05 DIAGNOSIS — I2699 Other pulmonary embolism without acute cor pulmonale: Secondary | ICD-10-CM | POA: Diagnosis not present

## 2022-05-05 DIAGNOSIS — M545 Low back pain, unspecified: Secondary | ICD-10-CM | POA: Diagnosis not present

## 2022-05-05 DIAGNOSIS — N183 Chronic kidney disease, stage 3 unspecified: Secondary | ICD-10-CM | POA: Diagnosis not present

## 2022-05-05 DIAGNOSIS — Z743 Need for continuous supervision: Secondary | ICD-10-CM | POA: Diagnosis not present

## 2022-05-05 DIAGNOSIS — E079 Disorder of thyroid, unspecified: Secondary | ICD-10-CM | POA: Diagnosis not present

## 2022-05-05 DIAGNOSIS — Z7982 Long term (current) use of aspirin: Secondary | ICD-10-CM | POA: Diagnosis not present

## 2022-05-05 DIAGNOSIS — J9811 Atelectasis: Secondary | ICD-10-CM | POA: Diagnosis not present

## 2022-05-05 DIAGNOSIS — R918 Other nonspecific abnormal finding of lung field: Secondary | ICD-10-CM | POA: Diagnosis not present

## 2022-05-05 DIAGNOSIS — M109 Gout, unspecified: Secondary | ICD-10-CM | POA: Diagnosis not present

## 2022-05-05 DIAGNOSIS — I517 Cardiomegaly: Secondary | ICD-10-CM | POA: Diagnosis not present

## 2022-05-05 DIAGNOSIS — R531 Weakness: Secondary | ICD-10-CM | POA: Diagnosis not present

## 2022-05-05 DIAGNOSIS — I480 Paroxysmal atrial fibrillation: Secondary | ICD-10-CM | POA: Diagnosis not present

## 2022-05-05 DIAGNOSIS — Z79899 Other long term (current) drug therapy: Secondary | ICD-10-CM | POA: Diagnosis not present

## 2022-05-05 DIAGNOSIS — I110 Hypertensive heart disease with heart failure: Secondary | ICD-10-CM | POA: Diagnosis not present

## 2022-05-05 LAB — BASIC METABOLIC PANEL
Anion gap: 12 (ref 5–15)
BUN: 77 mg/dL — ABNORMAL HIGH (ref 8–23)
CO2: 28 mmol/L (ref 22–32)
Calcium: 9.1 mg/dL (ref 8.9–10.3)
Chloride: 98 mmol/L (ref 98–111)
Creatinine, Ser: 1.93 mg/dL — ABNORMAL HIGH (ref 0.44–1.00)
GFR, Estimated: 28 mL/min — ABNORMAL LOW (ref >60)
Glucose, Bld: 133 mg/dL — ABNORMAL HIGH (ref 70–99)
Potassium: 4.5 mmol/L (ref 3.5–5.1)
Sodium: 138 mmol/L (ref 135–145)

## 2022-05-05 LAB — CBC
HCT: 35.4 % — ABNORMAL LOW (ref 36.0–46.0)
Hemoglobin: 11.7 g/dL — ABNORMAL LOW (ref 12.0–15.0)
MCH: 28.3 pg (ref 26.0–34.0)
MCHC: 33.1 g/dL (ref 30.0–36.0)
MCV: 85.7 fL (ref 80.0–100.0)
Platelets: 329 10*3/uL (ref 150–400)
RBC: 4.13 MIL/uL (ref 3.87–5.11)
RDW: 14.6 % (ref 11.5–15.5)
WBC: 10.9 10*3/uL — ABNORMAL HIGH (ref 4.0–10.5)
nRBC: 0 % (ref 0.0–0.2)

## 2022-05-05 LAB — PHOSPHORUS: Phosphorus: 4 mg/dL (ref 2.5–4.6)

## 2022-05-05 LAB — MAGNESIUM: Magnesium: 2.3 mg/dL (ref 1.7–2.4)

## 2022-05-05 MED ORDER — EZETIMIBE 10 MG PO TABS: 10.0000 mg | ORAL_TABLET | Freq: Every day | ORAL | 1 refills | Status: DC

## 2022-05-05 MED ORDER — BISOPROLOL FUMARATE 5 MG PO TABS: 5.0000 mg | ORAL_TABLET | Freq: Every day | ORAL | 1 refills | Status: DC

## 2022-05-05 MED ORDER — EMPAGLIFLOZIN 10 MG PO TABS: 10.0000 mg | ORAL_TABLET | Freq: Every day | ORAL | 1 refills | Status: DC

## 2022-05-05 MED ORDER — PREDNISONE 10 MG PO TABS: ORAL_TABLET | ORAL | 0 refills | Status: DC

## 2022-05-05 MED ORDER — PREDNISONE 10 MG PO TABS
ORAL_TABLET | ORAL | 0 refills | Status: DC
  Filled 2022-05-05: qty 34, 11d supply, fill #0

## 2022-05-05 MED ORDER — APIXABAN 5 MG PO TABS: ORAL_TABLET | ORAL | 0 refills | Status: DC

## 2022-05-05 NOTE — TOC Progression Note (Signed)
Transition of Care [facility]) - Progression Note    Patient Details  Name: Cynthia Cook MRN: [id number] Date of Birth: 1955-09-17  Transition of Care [facility]) CM/SW [place], [place] Phone Number: 05/05/2022, 12:10 PM  Clinical Narrative:     Patient has received insurance authorization:   approved 1/26 - 1/28 NRD 1/29 your contact is Ubaldo Glassing @ [phone] Ref [place] # [id number]   Expected Discharge Plan: Skilled Nursing Facility Barriers to Discharge: Barriers Resolved  Expected Discharge Plan and Services In-house Referral: Clinical Social Work Discharge Planning Services: CM Consult Post Acute Care Choice: Durable Medical Equipment Living arrangements for the past 2 months: Apartment                           HH Arranged: PT, OT, Nurse's Aide, Social Work [employer] Agency: [employer]         Social Determinants of Health (SDOH) Interventions SDOH Screenings   Food Insecurity: No Food Insecurity (05/04/2022)  Housing: Low Risk  (05/04/2022)  Transportation Needs: No Transportation Needs (05/04/2022)  Utilities: Not At Risk (05/04/2022)  Alcohol Screen: Low Risk  (05/04/2022)  Depression (PHQ2-9): Medium Risk (02/18/2021)  Financial Resource Strain: Low Risk  (05/04/2022)  Stress: Stress Concern Present (06/14/2021)  Tobacco Use: Low Risk  (05/04/2022)    Readmission Risk Interventions     No data to display

## 2022-05-05 NOTE — Progress Notes (Signed)
Attempted to call call report  to [facility] was unable to reach RN. Gave call back number to return call   Phoebe Sharps, RN

## 2022-05-05 NOTE — Progress Notes (Signed)
Mobility Specialist Progress Note:   05/05/22 0935  Mobility  Activity Ambulated with assistance in hallway  Level of Assistance Standby assist, set-up cues, supervision of patient - no hands on  Assistive Device Front wheel walker  Distance Ambulated (ft) 60 ft  Activity Response Tolerated well  $Mobility charge 1 Mobility   Pt in chair willing to participate in mobility. Complaints of back pain. Left in bathroom and was instructed to pull sting when finished.    Gareth Eagle Gaetan Spieker Mobility Specialist Please contact via [employer] or  Rehab Office at [phone]

## 2022-05-05 NOTE — Progress Notes (Signed)
   Heart Failure Stewardship Pharmacist Progress Note   PCP: Lurline Del, DO PCP-Cardiologist: Buford Dresser, MD    HPI:  67 yo F with PMH of afib, PE, CVA, SDH, hypothyroidism, and chronic back pain.   She presented to the ED on 1/20 with LE weakness, edema, and shortness of breath. Recently had a fall and found to have compression fracture of lumbar spine, then had RSV and PNA. Has had difficulty caring for herself. CXR with small bilateral pleural effusion. CTA with large right sided PE with mild right heart strain. ECHO 1/21 with LVEF 30-35% (was 55-60% 12/2021), global hypokinesis, RV moderately reduced.   Current HF Medications: Beta Blocker: bisoprolol 5 mg daily SGLT2i: Jardiance 10 mg daily  Prior to admission HF Medications: None  Pertinent Lab Values: Serum creatinine 1.93, BUN 77, Potassium 4.5, Sodium 138, BNP 580.5, Magnesium 2.3  Vital Signs: Weight: 177 lbs (admission weight: 188 lbs) Blood pressure: 120/70s  Heart rate: 80-90s  I/O: -0.2L yesterday; net -3.2L  Medication Assistance / Insurance Benefits Check: Does the patient have prescription insurance?  Yes Type of insurance plan: [place] [employer]  Outpatient Pharmacy:  [facility] Is the patient willing to use [place] at discharge? Yes Is the patient willing to transition their outpatient pharmacy to utilize a [facility] outpatient pharmacy?   Pending   Assessment: 1. Acute on chronic systolic CHF (LVEF 80-16%). NYHA class II symptoms. - No edema or JVD on exam. Off IV lasix. Need to be careful with diuresing with right heart strain. May need PRN lasix for discharge since she'll be monitored closely at SNF. Discussed with cardiology. Strict I/Os and daily weights. Keep K>4 and Mg>2. - Continue bisoprolol 5 mg daily. Did not tolerate metoprolol in the past. - BP too soft for ARB/MRA at this time. She is at increased fall risk. - Continue Jardiance 10 mg  daily   Plan: 1) Medication changes recommended at this time: - Add low dose PRN lasix for discharge - ok with cardiology  2) Patient assistance: - Jardiance copay $49 - SNF rehab at discharge  3)  Education  - Patient has been educated on current HF medications and potential additions to HF medication regimen - Patient verbalizes understanding that over the next few months, these medication doses may change and more medications may be added to optimize HF regimen - Patient has been educated on basic disease state pathophysiology and goals of therapy   Kerby Nora, PharmD, BCPS Heart Failure Stewardship Pharmacist Phone [phone]

## 2022-05-05 NOTE — TOC Progression Note (Incomplete)
Transition of Care [facility]) - Progression Note    Patient Details  Name: Cynthia Cook MRN: [id number] Date of Birth: 07-30-1955  Transition of Care [facility]) CM/SW [place], [place] Phone Number: 05/05/2022, 4:35 PM  Clinical Narrative:     CSW received call from patient - CSW informed the patient transportation has been called and she will d/c today. CSW unable to say anything for certain but advised of what steps has been taken  & transportation can been arranged with [facility].   CSW contacted APS worker [name] @ [phone] and advised the patient has accepted bed at [facility] rehab and that she is discharging today.  Thurmond Butts, MSW, LCSW Clinical Social Worker    Expected Discharge Plan: Skilled Nursing Facility Barriers to Discharge: Barriers Resolved  Expected Discharge Plan and Services In-house Referral: Clinical Social Work Discharge Planning Services: CM Consult Post Acute Care Choice: Durable Medical Equipment Living arrangements for the past 2 months: Apartment Expected Discharge Date: 05/05/22                         HH Arranged: PT, OT, Nurse's Aide, Social Work [employer] Agency: [employer]         Social Determinants of Health (SDOH) Interventions SDOH Screenings   Food Insecurity: No Food Insecurity (05/04/2022)  Housing: Low Risk  (05/04/2022)  Transportation Needs: No Transportation Needs (05/04/2022)  Utilities: Not At Risk (05/04/2022)  Alcohol Screen: Low Risk  (05/04/2022)  Depression (PHQ2-9): Medium Risk (02/18/2021)  Financial Resource Strain: Low Risk  (05/04/2022)  Stress: Stress Concern Present (06/14/2021)  Tobacco Use: Low Risk  (05/04/2022)    Readmission Risk Interventions     No data to display

## 2022-05-05 NOTE — Discharge Summary (Addendum)
Physician Discharge Summary  Dawnette Mione JZP:[id number] DOB: Aug 28, 1955 DOA: 04/29/2022  PCP: Lurline Del, DO  Admit date: 04/29/2022 Discharge date: 05/05/2022  Time spent: 40 minutes  Recommendations for Outpatient Follow-up:  Follow outpatient CBC/CMP  Follow with cardiology outpatient for HF (jardiance on hold due to AKI today, consider resuming outpatient) Follow peripheral vascular disease outpatient Complaining of paresthesias here to LE's, strength intact - [facility] reviewed as noted below - would follow actual report outpatient, follow outpatient  AKI today, possibly related to jardiance, repeat labs within next 2-3 days  Needs vascular follow up for peripheral vascular disease Likely indefinite anticoagulation for PE with history of this (also Rorey Hodges fib) - would continue discussion on risk factors, ensure up to date with cancer screening  Discharge Diagnoses:  Principal Problem:   Pulmonary embolism ([place]) Active Problems:   CHF (congestive heart failure) ([place])   Atrial fibrillation ([place])   History of pulmonary embolism   Elevated troponin I level   Lumbar compression fracture (H[facility])   Normocytic anemia   CKD (chronic kidney disease) stage 3, GFR 30-59 ml/min (H[facility]) - baseline SCr 1.3   Subdural hematoma ([place])   History of CVA (cerebrovascular accident)   Hypothyroidism   Acute combined systolic and diastolic heart failure ([place])   Paroxysmal atrial fibrillation with RVR ([place])   Gout flare   Discharge Condition: stable  Diet recommendation: heart healthy  Filed Weights   05/03/22 0437 05/04/22 0429 05/05/22 0135  Weight: 88.4 kg 86.5 kg 80.5 kg    History of present illness:  Teletha Petrea is Desta Bujak 67 y.o. female with medical history significant of paroxysmal atrial fibrillation not on anticoagulation, history of pulmonary embolism not on anticoagulation, history of CVA, subdural hematoma, hypothyroidism, and chronic back pain who presents with complaints of  decreased sensation in her lower legs.  Ultimately was found to have submassive PE and new heart failure.  Cardiology was consulted.  Plan for eliquis, outpatient follow up for heart failure.  Lumbar [facility] from December was reviewed as below, recommending outpatient follow up.    See below for additional details  Hospital Course:  Assessment and Plan: # Pulmonary embolism # Hypoxic respiratory failure Acute, severe right-sided PE on CTA, evidence right heart strain. Hemodynamically stable and relatively asymptomatic. PVL initial read negative for DVT. Hx of PE in past, not anticoagulated at time of presentation. Risk factors include recent illness (rsv) and relative immobility 2/2 recent fall with lumbar compression fracture.  - converted from heparin to apixaban on 1/22 - given history of prior PE, I think would likely benefit from long term/indefinite anticoagulation (she has afib as well).  Would follow up outpatient with PCP, continue to discuss risk factors.  Ensure up to date with routine cancer screening. - on RA today   # HFrEF 2/2 PE. TTE with EF 30-35, moderately reduced RV function. Likely Mandalyn Pasqua consequence of PE, Annsleigh Dragoo-fib.  - cardiology following - bisoprolol started 1/22.  Jardiance.  No BP room for ARB/ARNI.  Apparently discussed ischemic evaluation with cards (doesn't want cath, doesn't want stents or bypass, wants to work on medical management first).  See cards sign off from 1/25.   # Jaiyden Laur-fib with RVR - chads2vasc is 7. - bisoprolol as above - apixaban as above   # Peripheral Vascular Disease  Mottled Bilateral Lower Extremities - difficult to appreciate pedal pulses, RN was able to doppler pulse - ABI's with moderate bilateral lower extremity arterial disease and absent toe brachial  indices bilaterally    - appreciate vascular assistance  - no surgical intervention planned, planning for outpatient follow up   # Lumbar compression fracture Patient reports recent  fall evaluated at [facility] with mri which showed lumbar compression fracture MRI showed compression fracture along superior endplate of X90 and L5 without retropulsion of bone.  Chronic benign compression gracture evident at L2 with retropulsion of bone along the superior endplate contributing to mild central canal stenosis.  Overall fractures are generally benign in appearance, likely 2/2 osteoporosis or trauma.  Mild lumbar region DDD without evidence of discu herniation.  Grade 1 spondylolisthesis at L4-5 and mild lumbar curvature. (Obtained via verbal report from [facility], please obtain report from [facility] for definitive report) - pain control - c/o coccyx pain -> plain film of sacrum/coccyx with L2 compression deformity, age indeterminate, grade 1 anterolisthesis at L4-5, diffuse degenerative changes throughout lumbar spine, irregular lucency extending through L4 spinous process on lateral view is nonspecific, stool obscures sacrum and coccyx - will recommend outpatient follow up - outpt ortho f/u pending with Dr. Orland Penman - will need to call and f/u with [facility] - pt/ot consulted, advising SNF, patient desires this, TOC consulted - given chronicity of change in sensation from lower legs (since the fall) with intact strength and no saddle anesthesia or numbness (she notes change in sensation, not really numbness - describes inability to distinguish between tile and carpet at home) and MRI was obtained around that time which per my discussion with [facility] was as above, I don't think we need Sami Roes repeat MRI at this time.  Does need to follow actual MRI report.   # Gout with flare Patient reported onset of typical gout pain left ankle 1/23 similar to prior flares. Says her flares don't typically involve redness but the ankle is quite tender to touch/movement. Says prednisone tapers are what works best for her. - currently on prednisone 50 qd - will write for Arshia Rondon taper, symptoms improved    # CKD 3b Stable - monitor   # Hx CVA - home plavix discontinued; anticoagulation as above - not on statin due to patient preference   # Hypothyroid - TSH mildly elevated - cont home levothyroxine, will need outpt repeat of tsh       Procedures: ABI Summary:  Right: Resting right ankle-brachial index indicates moderate right lower  extremity arterial disease. The right toe-brachial index is absent.   Left: Resting left ankle-brachial index indicates moderate left lower  extremity arterial disease. The left toe-brachial index is absent.    Echo IMPRESSIONS     1. Left ventricular ejection fraction, by estimation, is 30 to 35%. The  left ventricle has moderately decreased function. The left ventricle  demonstrates global hypokinesis. There is moderate left ventricular  hypertrophy. Left ventricular diastolic  parameters are indeterminate.   2. Right ventricular systolic function is moderately reduced. The right  ventricular size is mildly enlarged. Tricuspid regurgitation signal is  inadequate for assessing PA pressure.   3. Left atrial size was severely dilated.   4. Right atrial size was severely dilated.   5. The mitral valve is degenerative. Mild to moderate mitral valve  regurgitation. Moderate mitral stenosis. The mean mitral valve gradient is  8.0 mmHg with average heart rate of 102 bpm. Severe mitral annular  calcification. MVA 1.5 cm^2 by continuity  equation   6. The aortic valve is calcified. There is moderate calcification of the  aortic valve. Aortic valve  regurgitation is trivial. Moderate aortic valve  stenosis. Mild AS by gradients (Vmax 2.1 m/s, MG 79mHg), moderate by AVA  (1.2 cm^2) and DI (0.4).  Suspect low flow low gradient moderate AS   7. The inferior vena cava is dilated in size with <50% respiratory  variability, suggesting right atrial pressure of 15 mmHg.    Consultations: Vascular cardiology  Discharge Exam: Vitals:   05/05/22 0440  05/05/22 0855  BP: 132/81 112/75  Pulse: 84 79  Resp: 17 19  Temp: 97.9 F (36.6 C) 97.6 F (36.4 C)  SpO2: 97% 98%   No complaints today Just pain in her butt, wonders if she has coccyx fracture Discussed d/c plan  General: No acute distress. Cardiovascular: RRR Lungs: unlabored Abdomen: Soft, nontender, nondistended  Neurological: Alert and oriented 3. Moves all extremities 4 with equal strength. Cranial nerves II through XII grossly intact. Extremities: No clubbing or cyanosis. No edema. Deferred feet exam today. Discharge Instructions   Discharge Instructions     (HEART FAILURE PATIENTS) Call MD:  Anytime you have any of the following symptoms: 1) 3 pound weight gain in 24 hours or 5 pounds in 1 week 2) shortness of breath, with or without Namish Krise dry hacking cough 3) swelling in the hands, feet or stomach 4) if you have to sleep on extra pillows at night in order to breathe.   Complete by: As directed    Ambulatory referral to Neurology   Complete by: As directed    An appointment is requested in approximately: 4 weeks   Call MD for:  difficulty breathing, headache or visual disturbances   Complete by: As directed    Call MD for:  extreme fatigue   Complete by: As directed    Call MD for:  hives   Complete by: As directed    Call MD for:  persistant dizziness or light-headedness   Complete by: As directed    Call MD for:  persistant nausea and vomiting   Complete by: As directed    Call MD for:  redness, tenderness, or signs of infection (pain, swelling, redness, odor or green/yellow discharge around incision site)   Complete by: As directed    Call MD for:  severe uncontrolled pain   Complete by: As directed    Call MD for:  temperature >100.4   Complete by: As directed    Diet - low sodium heart healthy   Complete by: As directed    Discharge instructions   Complete by: As directed    You were seen for shortness of breath.  You were found to have blood clots in  your lungs.  We've started you on eliquis.  Because you have Brandye Inthavong history of pulmonary embolism, I would favor indefinite or prolonged anticoagulation for you, but you should discuss and follow this up with your PCP outpatient to review your risks for blood clots.  You should ensure you're up to date with routine cancer screening tests.  You have acute kidney injury on discharge, you'll need repeat labs within Chameka Mcmullen few days.  I've stopped your jardiance for now given your decreased kidney function.  Cardiology can consider resuming this if this improves.  You've been started on bisoprolol.  You should follow up with cardiology as an outpatient for your heart failure.  You have peripheral vascular disease and will need to follow with vascular surgery as an outpatient.   For your compression fracture, follow up with [facility].  You had an  appointment planned with Dr. Orland Penman.  Please follow up your MRI results outpatient.  I'll taper your steroids for your gout flare.  You need repeat thyroid function tests outpatient.  We're resuming your zetia.  Return for new, recurrent, or worsening symptoms.  Please ask your PCP to request records from this hospitalization so they know what was done and what the next steps will be.   Discharge wound care:   Complete by: As directed    Foam dressing to sacrum/bilat buttocks, change Q 3 days or PRN soiling   Increase activity slowly   Complete by: As directed       Allergies as of 05/05/2022       Reactions   Codeine Nausea Only   Duragesic-100 [fentanyl] Nausea And Vomiting   Dizziness Lightheadedness    Mucinex [guaifenesin Er] Other (See Comments)   Syncope    Nsaids Other (See Comments)   Kidney disease        Medication List     STOP taking these medications    amoxicillin-clavulanate 875-125 MG tablet Commonly known as: AUGMENTIN   clopidogrel 75 MG tablet Commonly known as: PLAVIX   metoprolol succinate 25 MG 24 hr  tablet Commonly known as: TOPROL-XL       TAKE these medications    apixaban 5 MG Tabs tablet Commonly known as: ELIQUIS Take 2 tablets (10 mg total) by mouth 2 (two) times daily for 3 days, THEN 1 tablet (5 mg total) 2 (two) times daily. Start taking on: May 05, 2022   b complex vitamins capsule Take 1 capsule by mouth daily.   bisoprolol 5 MG tablet Commonly known as: ZEBETA Take 1 tablet (5 mg total) by mouth daily. Start taking on: May 06, 2022   ezetimibe 10 MG tablet Commonly known as: Zetia Take 1 tablet (10 mg total) by mouth daily.   levothyroxine 50 MCG tablet Commonly known as: SYNTHROID Take 1 tablet (50 mcg) by mouth on Monday, Wednesday, and Friday What changed:  how much to take how to take this when to take this additional instructions   levothyroxine 75 MCG tablet Commonly known as: SYNTHROID Take 1 tablet (75 mcg) by mouth on Tuesday, Thursday, Saturday, and Sunday What changed:  how much to take how to take this when to take this additional instructions   OVER THE COUNTER MEDICATION Take 1 tablet by mouth daily with supper. Calcium + D3 + K2   Peroxyl 1.5 % Soln Generic drug: hydrogen peroxide Gargle, swish and spit daily What changed:  how much to take when to take this additional instructions   predniSONE 10 MG tablet Commonly known as: DELTASONE Take 5 tablets (50 mg total) by mouth daily with breakfast for 2 days, THEN 4 tablets (40 mg total) daily with breakfast for 3 days, THEN 3 tablets (30 mg total) daily with breakfast for 2 days, THEN 2 tablets (20 mg total) daily with breakfast for 2 days, THEN 1 tablet (10 mg total) daily with breakfast for 2 days. Start taking on: May 06, 2022   vitamin C 1000 MG tablet Take 4,000 mg by mouth daily.   vitamin E 180 MG (400 UNITS) capsule Take 400 Units by mouth daily.               Discharge Care Instructions  (From admission, onward)           Start      Ordered   05/05/22 0000  Discharge wound care:  Comments: Foam dressing to sacrum/bilat buttocks, change Q 3 days or PRN soiling   05/05/22 1424           Allergies  Allergen Reactions   Codeine Nausea Only   Duragesic-100 [Fentanyl] Nausea And Vomiting    Dizziness Lightheadedness    Mucinex [Guaifenesin Er] Other (See Comments)    Syncope    Nsaids Other (See Comments)    Kidney disease    Follow-up Information     [facility]. Go to.   Specialty: Cardiology Why: Hospital follow up 05/22/2022 @ 12 noon  PLEASE bring Merryl Buckels current medication list to appointment FREE valet parking, Entrance C, off [employer] information: [address] [phone] [place]        Loel Dubonnet, NP. Go on 05/18/2022.   Specialty: Cardiology Why: Please go to follow up with Laurann Montana, NP on 05/18/22 at 2:20 PM at the [facility] office on [facility] information: [place] [address] [facility] [facility] Follow up.   Specialty: Vascular Surgery Why: you need [facility] follow up appointment for your peripheral vascular disease, call for an appointment Contact information: [place] [phone]        [facility] Follow up.   Specialty: Specialist Why: call for follow up for your compression fractures Contact information: [address] [place] [address] [phone]         Lurline Del, DO Follow up.   Specialty: Family Medicine Why: call for follow up of your hospitalization Contact information: [place] [address] [phone]                  The results of significant diagnostics from this hospitalization (including imaging, microbiology, ancillary and laboratory) are  listed below for reference.    Significant Diagnostic Studies: DG Sacrum/Coccyx  Result Date: 05/05/2022 CLINICAL DATA:  Lower back pain EXAM: SACRUM AND COCCYX - 2 VIEW COMPARISON:  Hips and pelvis radiograph dated 02/18/2020 FINDINGS: The sacrum and coccyx are partially obscured by overlying bowel contents on the frontal views. Large volume stool throughout the visualized colon. Vascular calcifications. L2 compression deformity, age indeterminate, and grade 1 anterolisthesis at L4-5. Diffuse intervertebral disc space narrowing throughout the lumbar spine. No radiographic finding of acute displaced fracture. Irregular lucency extends through the L4 spinous process. IMPRESSION: 1. L2 compression deformity, age indeterminate, and grade 1 anterolisthesis at L4-5. 2. Diffuse degenerative changes throughout the lumbar spine. 3. Irregular lucency extending through the L4 spinous process on lateral view is nonspecific and may reflect superimposition of structures or nondisplaced fracture lucency if there has been recent trauma. No radiographic finding of acute displaced fracture. Diffuse demineralization limits evaluation for nondisplaced fracture. If there is continued clinical concern recommend further evaluation with cross-sectional imaging. 4. Large volume stool throughout the visualized colon, which obscures the sacrum and coccyx on frontal views. Electronically Signed   By: Darrin Nipper M.D.   On: 05/05/2022 11:24   VAS [place] ABI WITH/WO TBI  Result Date: 05/04/2022  LOWER EXTREMITY DOPPLER STUDY Patient Name:  RANESHA VAL  Date of Exam:   05/04/2022 Medical Rec #: [id number]       Accession #:    [id number] Date of Birth: Jan 10, 1956        Patient Gender: F Patient  Age:   39 years Exam Location:  [facility] Procedure:      VAS [place] ABI WITH/WO TBI Referring Phys: Gianny Sabino POWELL JR --------------------------------------------------------------------------------  Indications: Diminished pulses High Risk Factors:  No history of smoking, prior CVA. Other Factors: Afib, CKD, CHF.  Limitations: Today's exam was limited due to Persistent Afib. Comparison Study: No previous exams Performing Technologist: Hill, Jody RVT, RDMS  Examination Guidelines: Karin Griffith complete evaluation includes at minimum, Doppler waveform signals and systolic blood pressure reading at the level of bilateral brachial, anterior tibial, and posterior tibial arteries, when vessel segments are accessible. Bilateral testing is considered an integral part of Khoury Siemon complete examination. Photoelectric Plethysmograph (PPG) waveforms and toe systolic pressure readings are included as required and additional duplex testing as needed. Limited examinations for reoccurring indications may be performed as noted.  ABI Findings: +---------+------------------+-----+-----------------+-------------------------+ Right    Rt Pressure (mmHg)IndexWaveform         Comment                   +---------+------------------+-----+-----------------+-------------------------+ Brachial 146                    triphasic                                  +---------+------------------+-----+-----------------+-------------------------+ PTA      118               0.81 monophasic                                 +---------+------------------+-----+-----------------+-------------------------+ DP       116               0.79 monophasic                                 +---------+------------------+-----+-----------------+-------------------------+ Great Toe0                 0.00 severely abnormalseverely damped waveform                                                   w/ absent pressure        +---------+------------------+-----+-----------------+-------------------------+ +---------+------------------+-----+----------+-------+ Left     Lt Pressure (mmHg)IndexWaveform  Comment +---------+------------------+-----+----------+-------+ Brachial 135                     triphasic         +---------+------------------+-----+----------+-------+ PTA      112               0.77 monophasic        +---------+------------------+-----+----------+-------+ DP       102               0.70 monophasic        +---------+------------------+-----+----------+-------+ Great Toe0                 0.00 Absent            +---------+------------------+-----+----------+-------+ +-------+-----------+-----------+------------+------------+ ABI/TBIToday's ABIToday's TBIPrevious ABIPrevious TBI +-------+-----------+-----------+------------+------------+ Right  0.81       Absent                              +-------+-----------+-----------+------------+------------+  Left   0.77       Absent                              +-------+-----------+-----------+------------+------------+  Summary: Right: Resting right ankle-brachial index indicates moderate right lower extremity arterial disease. The right toe-brachial index is absent. Left: Resting left ankle-brachial index indicates moderate left lower extremity arterial disease. The left toe-brachial index is absent. *See table(s) above for measurements and observations.  Electronically signed by Monica Martinez MD on 05/04/2022 at 2:27:26 PM.    Final    VAS [place] LOWER EXTREMITY VENOUS (DVT)  Result Date: 04/30/2022  Lower Venous DVT Study Patient Name:  JANEAH KOVACICH  Date of Exam:   04/30/2022 Medical Rec #: [id number]       Accession #:    [id number] Date of Birth: [birth year]        Patient Gender: F Patient Age:   89 years Exam [facility] Procedure:      VAS [place] LOWER EXTREMITY VENOUS (DVT) Referring Phys: Klink Plan --------------------------------------------------------------------------------  Indications: Pulmonary embolism.  Comparison Study: no prior Performing Technologist: Archie Patten RVS  Examination Guidelines: Toivo Bordon complete evaluation includes B-mode imaging, spectral Doppler, color  Doppler, and power Doppler as needed of all accessible portions of each vessel. Bilateral testing is considered an integral part of Sahira Cataldi complete examination. Limited examinations for reoccurring indications may be performed as noted. The reflux portion of the exam is performed with the patient in reverse Trendelenburg.  +--------+---------------+---------+-----------+----------+--------------------+ RIGHT   CompressibilityPhasicitySpontaneityPropertiesThrombus Aging       +--------+---------------+---------+-----------+----------+--------------------+ CFV     Full           Yes      Yes                                       +--------+---------------+---------+-----------+----------+--------------------+ SFJ     Full                                                              +--------+---------------+---------+-----------+----------+--------------------+ FV Prox Full                                                              +--------+---------------+---------+-----------+----------+--------------------+ FV Mid  Full                                                              +--------+---------------+---------+-----------+----------+--------------------+ FV      Full  Distal                                                                    +--------+---------------+---------+-----------+----------+--------------------+ PFV     Full                                                              +--------+---------------+---------+-----------+----------+--------------------+ POP     Full           Yes      Yes                                       +--------+---------------+---------+-----------+----------+--------------------+ PTV     Full                                                              +--------+---------------+---------+-----------+----------+--------------------+ PERO                    Yes      Yes                  patent by color                                                           doppler              +--------+---------------+---------+-----------+----------+--------------------+   +---------+---------------+---------+-----------+----------+-------------------+ LEFT     CompressibilityPhasicitySpontaneityPropertiesThrombus Aging      +---------+---------------+---------+-----------+----------+-------------------+ CFV      Full           Yes                                               +---------+---------------+---------+-----------+----------+-------------------+ SFJ      Full                                                             +---------+---------------+---------+-----------+----------+-------------------+ FV Prox  Full                                                             +---------+---------------+---------+-----------+----------+-------------------+  FV Mid   Full                                                             +---------+---------------+---------+-----------+----------+-------------------+ FV DistalFull                                                             +---------+---------------+---------+-----------+----------+-------------------+ PFV      Full                                                             +---------+---------------+---------+-----------+----------+-------------------+ POP      Full           Yes      Yes                                      +---------+---------------+---------+-----------+----------+-------------------+ PTV      Full                                                             +---------+---------------+---------+-----------+----------+-------------------+ PERO                                                  Not well visualized +---------+---------------+---------+-----------+----------+-------------------+     Summary:  RIGHT: - There is no evidence of deep vein thrombosis in the lower extremity.  - No cystic structure found in the popliteal fossa.  LEFT: - There is no evidence of deep vein thrombosis in the lower extremity.  - Jori Frerichs cystic structure is found in the popliteal fossa.  *See table(s) above for measurements and observations. Electronically signed by Harold Barban MD on 04/30/2022 at 2:13:52 PM.    Final    ECHOCARDIOGRAM COMPLETE  Result Date: 04/30/2022    ECHOCARDIOGRAM REPORT   Patient Name:   Anatalia Kronk Date of Exam: 04/30/2022 Medical Rec #:  [id number]      Height:       66.0 in Accession #:    [id number]     Weight:       188.3 lb Date of Birth:  1955-10-13       BSA:          1.949 m Patient Age:    67 years       BP:           118/81 mmHg Patient Gender: F              HR:  103 bpm. Exam Location:  Inpatient Procedure: 2D Echo, Cardiac Doppler and Color Doppler Indications:    Pulmonary embolus  History:        Patient has prior history of Echocardiogram examinations, most                 recent 12/31/2021. CHF; Arrythmias:Atrial Fibrillation. Elevated                 troponin. CKD. Hx CVA.  Sonographer:    Clayton Lefort RDCS (AE) Referring Phys: Eustaquio Boyden Arleth Mccullar SMITH IMPRESSIONS  1. Left ventricular ejection fraction, by estimation, is 30 to 35%. The left ventricle has moderately decreased function. The left ventricle demonstrates global hypokinesis. There is moderate left ventricular hypertrophy. Left ventricular diastolic parameters are indeterminate.  2. Right ventricular systolic function is moderately reduced. The right ventricular size is mildly enlarged. Tricuspid regurgitation signal is inadequate for assessing PA pressure.  3. Left atrial size was severely dilated.  4. Right atrial size was severely dilated.  5. The mitral valve is degenerative. Mild to moderate mitral valve regurgitation. Moderate mitral stenosis. The mean mitral valve gradient is 8.0 mmHg with average heart rate of 102 bpm. Severe  mitral annular calcification. MVA 1.5 cm^2 by continuity equation  6. The aortic valve is calcified. There is moderate calcification of the aortic valve. Aortic valve regurgitation is trivial. Moderate aortic valve stenosis. Mild AS by gradients (Vmax 2.1 m/s, MG 52mHg), moderate by AVA (1.2 cm^2) and DI (0.4). Suspect low flow low gradient moderate AS  7. The inferior vena cava is dilated in size with <50% respiratory variability, suggesting right atrial pressure of 15 mmHg. FINDINGS  Left Ventricle: Left ventricular ejection fraction, by estimation, is 30 to 35%. The left ventricle has moderately decreased function. The left ventricle demonstrates global hypokinesis. The left ventricular internal cavity size was small. There is moderate left ventricular hypertrophy. Left ventricular diastolic parameters are indeterminate. Right Ventricle: The right ventricular size is mildly enlarged. Right vetricular wall thickness was not well visualized. Right ventricular systolic function is moderately reduced. Tricuspid regurgitation signal is inadequate for assessing PA pressure. Left Atrium: Left atrial size was severely dilated. Right Atrium: Right atrial size was severely dilated. Pericardium: There is no evidence of pericardial effusion. Mitral Valve: The mitral valve is degenerative in appearance. Severe mitral annular calcification. Mild to moderate mitral valve regurgitation. Moderate mitral valve stenosis. MV peak gradient, 15.4 mmHg. The mean mitral valve gradient is 8.0 mmHg with average heart rate of 102 bpm. Tricuspid Valve: The tricuspid valve is normal in structure. Tricuspid valve regurgitation is trivial. Aortic Valve: The aortic valve is calcified. There is moderate calcification of the aortic valve. Aortic valve regurgitation is trivial. Moderate aortic stenosis is present. Aortic valve mean gradient measures 10.0 mmHg. Aortic valve peak gradient measures 18.0 mmHg. Aortic valve area, by VTI measures 1.24  cm. Pulmonic Valve: The pulmonic valve was not well visualized. Pulmonic valve regurgitation is trivial. Aorta: The aortic root is normal in size and structure. Venous: The inferior vena cava is dilated in size with less than 50% respiratory variability, suggesting right atrial pressure of 15 mmHg. IAS/Shunts: The interatrial septum was not well visualized.  LEFT VENTRICLE PLAX 2D LVIDd:         3.40 cm LVIDs:         3.00 cm LV PW:         1.30 cm LV IVS:        1.50 cm LVOT diam:  2.00 cm LV SV:         43 LV SV Index:   22 LVOT Area:     3.14 cm  RIGHT VENTRICLE            IVC RV Basal diam:  4.30 cm    IVC diam: 2.30 cm RV Mid diam:    4.00 cm RV S prime:     8.27 cm/s TAPSE (M-mode): 1.0 cm LEFT ATRIUM              Index        RIGHT ATRIUM           Index LA diam:        3.90 cm  2.00 cm/m   RA Area:     29.20 cm LA Vol (A2C):   104.0 ml 53.36 ml/m  RA Volume:   117.00 ml 60.03 ml/m LA Vol (A4C):   91.4 ml  46.89 ml/m LA Biplane Vol: 100.0 ml 51.31 ml/m  AORTIC VALVE AV Area (Vmax):    1.22 cm AV Area (Vmean):   1.28 cm AV Area (VTI):     1.24 cm AV Vmax:           212.00 cm/s AV Vmean:          143.000 cm/s AV VTI:            0.350 m AV Peak Grad:      18.0 mmHg AV Mean Grad:      10.0 mmHg LVOT Vmax:         82.30 cm/s LVOT Vmean:        58.100 cm/s LVOT VTI:          0.138 m LVOT/AV VTI ratio: 0.39  AORTA Ao Root diam: 3.10 cm MITRAL VALVE MV Area VTI:  1.54 cm        SHUNTS MV Peak grad: 15.4 mmHg       Systemic VTI:  0.14 m MV Mean grad: 8.0 mmHg        Systemic Diam: 2.00 cm MV Vmax:      1.96 m/s MV Vmean:     130.0 cm/s MR Peak grad:    104.0 mmHg MR Mean grad:    63.0 mmHg MR Vmax:         510.00 cm/s MR Vmean:        370.0 cm/s MR PISA:         1.57 cm MR PISA Eff ROA: 12 mm MR PISA Radius:  0.50 cm Oswaldo Milian MD Electronically signed by Oswaldo Milian MD Signature Date/Time: 04/30/2022/1:54:53 PM    Final    CT Angio Chest PE W/Cm &/Or Wo Cm  Result Date:  04/29/2022 CLINICAL DATA:  Suspected pulmonary embolism. EXAM: CT ANGIOGRAPHY CHEST WITH CONTRAST TECHNIQUE: Multidetector CT imaging of the chest was performed using the standard protocol during bolus administration of intravenous contrast. Multiplanar CT image reconstructions and MIPs were obtained to evaluate the vascular anatomy. RADIATION DOSE REDUCTION: This exam was performed according to the departmental dose-optimization program which includes automated exposure control, adjustment of the mA and/or kV according to patient size and/or use of iterative reconstruction technique. CONTRAST:  34m OMNIPAQUE IOHEXOL 350 MG/ML SOLN COMPARISON:  February 27, 2020 FINDINGS: Cardiovascular: There is marked severity calcification of the thoracic aorta. The ascending thoracic aorta measures 4.3 cm in diameter. Marked severity intraluminal low attenuation is seen within the mid and distal portions of the right pulmonary artery involvement of proximal  right middle lobe and right lower lobe branches is seen. There is mild cardiomegaly with marked severity coronary artery calcification. Mild right heart strain is seen (RV/LV ratio of 1.14). No pericardial effusion. Mediastinum/Nodes: No enlarged mediastinal, hilar, or axillary lymph nodes. Thyroid gland, trachea, and esophagus demonstrate no significant findings. Lungs/Pleura: Mild left basilar and moderate severity right basilar atelectasis is seen. Small to moderate size bilateral pleural effusions are noted, right slightly greater than left. No pneumothorax is identified. Upper Abdomen: Multiple surgical clips are seen within the gallbladder fossa. Noninflamed diverticula are seen within the visualized portion of the splenic flexure. Musculoskeletal: Multilevel degenerative changes are seen throughout the thoracic spine. Review of the MIP images confirms the above findings. IMPRESSION: 1. Marked severity right-sided pulmonary embolism with mild right heart strain (RV/LV  ratio of 1.14). 2. Mild left basilar and moderate severity right basilar atelectasis. 3. Small to moderate size bilateral pleural effusions, right slightly greater than left. 4. Colonic diverticulosis. 5. Evidence of prior cholecystectomy. 6. Aortic atherosclerosis. Aortic Atherosclerosis (ICD10-I70.0). Electronically Signed   By: Virgina Norfolk M.D.   On: 04/29/2022 17:43   DG Chest 2 View  Result Date: 04/29/2022 CLINICAL DATA:  Fatigue EXAM: CHEST - 2 VIEW COMPARISON:  03/30/2022 FINDINGS: Small bilateral pleural effusion. No visible edema or air bronchogram. Bulky mitral annular calcification. Normal heart size and stable mediastinal contours. IMPRESSION: Small bilateral pleural effusion. Electronically Signed   By: Jorje Guild M.D.   On: 04/29/2022 07:34   CT HEAD WO CONTRAST (5MM)  Result Date: 04/29/2022 CLINICAL DATA:  Neuro deficit with acute stroke suspected EXAM: CT HEAD WITHOUT CONTRAST TECHNIQUE: Contiguous axial images were obtained from the base of the skull through the vertex without intravenous contrast. RADIATION DOSE REDUCTION: This exam was performed according to the departmental dose-optimization program which includes automated exposure control, adjustment of the mA and/or kV according to patient size and/or use of iterative reconstruction technique. COMPARISON:  Brain MRI 12/30/2021 FINDINGS: Brain: No evidence of acute infarction, hemorrhage, hydrocephalus, or mass lesion/mass effect. Chronic small vessel infarcts in the bilateral cerebellum, bilateral caudate, and left corona radiata. Subdural collections on prior not confidently seen today. Vascular: No hyperdense vessel or unexpected calcification. Skull: Normal. Negative for fracture or focal lesion. Sinuses/Orbits: No acute finding. IMPRESSION: 1. No acute finding. 2. Chronic infra and supratentorial small vessel infarcts. Electronically Signed   By: Jorje Guild M.D.   On: 04/29/2022 07:14    Microbiology: Recent  Results (from the past 240 hour(s))  Culture, blood (single)     Status: None   Collection Time: 04/29/22  6:40 AM   Specimen: BLOOD LEFT ARM  Result Value Ref Range Status   Specimen Description BLOOD LEFT ARM  Final   Special Requests   Final    BOTTLES DRAWN AEROBIC AND ANAEROBIC Blood Culture adequate volume   Culture   Final    NO GROWTH 5 DAYS Performed at [facility], [address] [place], [place] [address]    Report Status 05/04/2022 FINAL  Final     Labs: Basic Metabolic Panel: Recent Labs  Lab 04/30/22 0124 04/30/22 1442 05/01/22 0051 05/02/22 0319 05/03/22 0329 05/04/22 0155 05/05/22 0208  NA 139   < > 139 141 139 138 138  K 5.2*   < > 4.7 4.5 3.8 3.9 4.5  CL 104   < > 101 98 98 96* 98  CO2 25   < > 31 33* 32 31 28  GLUCOSE 121*   < >  122* 111* 168* 139* 133*  BUN 44*   < > 47* 64* 66* 68* 77*  CREATININE 1.48*   < > 1.57* 1.56* 1.45* 1.53* 1.93*  CALCIUM 9.0   < > 8.7* 8.7* 9.0 8.8* 9.1  MG 2.1  --   --   --   --   --  2.3  PHOS  --   --   --   --   --   --  4.0   < > = values in this interval not displayed.   Liver Function Tests: Recent Labs  Lab 04/29/22 0639  AST 24  ALT 23  ALKPHOS 66  BILITOT 0.7  PROT 6.8  ALBUMIN 3.0*   No results for input(s): "LIPASE", "AMYLASE" in the last 168 hours. No results for input(s): "AMMONIA" in the last 168 hours. CBC: Recent Labs  Lab 04/29/22 0639 04/30/22 0124 05/01/22 0051 05/05/22 0208  WBC 6.1 9.0 7.3 10.9*  HGB 10.7* 11.4* 10.6* 11.7*  HCT 34.9* 35.1* 32.9* 35.4*  MCV 90.6 87.1 88.0 85.7  PLT 257 218 223 329   Cardiac Enzymes: No results for input(s): "CKTOTAL", "CKMB", "CKMBINDEX", "TROPONINI" in the last 168 hours. BNP: BNP (last 3 results) Recent Labs    04/29/22 0639  BNP 580.5*    ProBNP (last 3 results) No results for input(s): "PROBNP" in the last 8760 hours.  CBG: No results for input(s): "GLUCAP" in the last 168 hours.     Signed:  Fayrene Helper MD.   [facility] 05/05/2022, 3:16 PM

## 2022-05-05 NOTE — Care Management Important Message (Signed)
Important Message  Patient Details  Name: Cynthia Cook MRN: [id number] Date of Birth: 07-Jul-1955   Medicare Important Message Given:  Yes     Shelda Altes 05/05/2022, 8:55 AM

## 2022-05-05 NOTE — TOC Transition Note (Signed)
Transition of Care [facility]) - CM/SW Discharge Note   Patient Details  Name: Cynthia Cook MRN: [id number] Date of Birth: 10-15-1955  [facility] CM/SW Contact:  Vinie Sill, LCSW Phone Number: 05/05/2022, 12:16 PM   Clinical Narrative:     Patient will Discharge to: [facility]  Discharge Date: 05/05/2022 Family Notified: declined  Transport By: Corey Harold  Per MD patient is ready for discharge. RN, patient, and facility notified of discharge. Discharge Summary sent to facility. RN given number for [phone]. Ambulance transport requested for patient.   Clinical Social Worker signing off.  Thurmond Butts, MSW, LCSW Clinical Social Worker     Final next level of care: Skilled Nursing Facility Barriers to Discharge: Barriers Resolved   Patient Goals and CMS Choice   Choice offered to / list presented to : Patient  Discharge Placement                Patient chooses bed at:  [facility]) Patient to be transferred to facility by: [facility] Name of family member notified: declined Patient and family notified of of transfer: 05/05/22  Discharge Plan and Services Additional resources added to the After Visit Summary for   In-house Referral: Clinical Social Work Discharge Planning Services: CM Consult Post Acute Care Choice: Durable Medical Equipment                    HH Arranged: PT, OT, Nurse's Aide, Social Work [employer] Agency: [employer]        Social Determinants of Health (SDOH) Interventions SDOH Screenings   Food Insecurity: No Food Insecurity (05/04/2022)  Housing: Low Risk  (05/04/2022)  Transportation Needs: No Transportation Needs (05/04/2022)  Utilities: Not At Risk (05/04/2022)  Alcohol Screen: Low Risk  (05/04/2022)  Depression (PHQ2-9): Medium Risk (02/18/2021)  Financial Resource Strain: Low Risk  (05/04/2022)  Stress: Stress Concern Present (06/14/2021)  Tobacco Use: Low Risk  (05/04/2022)     Readmission Risk  Interventions     No data to display

## 2022-05-06 DIAGNOSIS — I110 Hypertensive heart disease with heart failure: Secondary | ICD-10-CM | POA: Diagnosis not present

## 2022-05-06 DIAGNOSIS — R918 Other nonspecific abnormal finding of lung field: Secondary | ICD-10-CM | POA: Diagnosis not present

## 2022-05-06 DIAGNOSIS — Z8673 Personal history of transient ischemic attack (TIA), and cerebral infarction without residual deficits: Secondary | ICD-10-CM | POA: Diagnosis not present

## 2022-05-06 DIAGNOSIS — Z7901 Long term (current) use of anticoagulants: Secondary | ICD-10-CM | POA: Diagnosis not present

## 2022-05-06 DIAGNOSIS — I2699 Other pulmonary embolism without acute cor pulmonale: Secondary | ICD-10-CM | POA: Diagnosis not present

## 2022-05-06 DIAGNOSIS — I517 Cardiomegaly: Secondary | ICD-10-CM | POA: Diagnosis not present

## 2022-05-06 DIAGNOSIS — J9811 Atelectasis: Secondary | ICD-10-CM | POA: Diagnosis not present

## 2022-05-06 DIAGNOSIS — Z79899 Other long term (current) drug therapy: Secondary | ICD-10-CM | POA: Diagnosis not present

## 2022-05-06 DIAGNOSIS — E785 Hyperlipidemia, unspecified: Secondary | ICD-10-CM | POA: Diagnosis not present

## 2022-05-06 DIAGNOSIS — R0602 Shortness of breath: Secondary | ICD-10-CM | POA: Diagnosis not present

## 2022-05-06 DIAGNOSIS — J811 Chronic pulmonary edema: Secondary | ICD-10-CM | POA: Diagnosis not present

## 2022-05-06 DIAGNOSIS — J90 Pleural effusion, not elsewhere classified: Secondary | ICD-10-CM | POA: Diagnosis not present

## 2022-05-06 DIAGNOSIS — E079 Disorder of thyroid, unspecified: Secondary | ICD-10-CM | POA: Diagnosis not present

## 2022-05-06 DIAGNOSIS — Z885 Allergy status to narcotic agent status: Secondary | ICD-10-CM | POA: Diagnosis not present

## 2022-05-06 DIAGNOSIS — Z7982 Long term (current) use of aspirin: Secondary | ICD-10-CM | POA: Diagnosis not present

## 2022-05-06 DIAGNOSIS — I509 Heart failure, unspecified: Secondary | ICD-10-CM | POA: Diagnosis not present

## 2022-05-08 ENCOUNTER — Telehealth: Payer: Self-pay | Admitting: Physician Assistant

## 2022-05-08 NOTE — Telephone Encounter (Signed)
-----  Message from Dagoberto Ligas, PA-C sent at 05/05/2022  6:58 AM EST -----  BLE arterial duplex and ABI in 3 months for MD, new patient.  Hospital follow up.  She will be a new vascular patient. Thanks

## 2022-05-09 DIAGNOSIS — M109 Gout, unspecified: Secondary | ICD-10-CM | POA: Diagnosis not present

## 2022-05-09 DIAGNOSIS — I482 Chronic atrial fibrillation, unspecified: Secondary | ICD-10-CM | POA: Diagnosis not present

## 2022-05-09 DIAGNOSIS — S22080D Wedge compression fracture of T11-T12 vertebra, subsequent encounter for fracture with routine healing: Secondary | ICD-10-CM | POA: Diagnosis not present

## 2022-05-09 DIAGNOSIS — Z8673 Personal history of transient ischemic attack (TIA), and cerebral infarction without residual deficits: Secondary | ICD-10-CM | POA: Diagnosis not present

## 2022-05-09 DIAGNOSIS — I2699 Other pulmonary embolism without acute cor pulmonale: Secondary | ICD-10-CM | POA: Diagnosis not present

## 2022-05-09 DIAGNOSIS — S32050D Wedge compression fracture of fifth lumbar vertebra, subsequent encounter for fracture with routine healing: Secondary | ICD-10-CM | POA: Diagnosis not present

## 2022-05-09 DIAGNOSIS — Z7901 Long term (current) use of anticoagulants: Secondary | ICD-10-CM | POA: Diagnosis not present

## 2022-05-09 DIAGNOSIS — I739 Peripheral vascular disease, unspecified: Secondary | ICD-10-CM | POA: Diagnosis not present

## 2022-05-09 DIAGNOSIS — I5020 Unspecified systolic (congestive) heart failure: Secondary | ICD-10-CM | POA: Diagnosis not present

## 2022-05-09 DIAGNOSIS — D631 Anemia in chronic kidney disease: Secondary | ICD-10-CM | POA: Diagnosis not present

## 2022-05-09 DIAGNOSIS — E039 Hypothyroidism, unspecified: Secondary | ICD-10-CM | POA: Diagnosis not present

## 2022-05-09 DIAGNOSIS — N1832 Chronic kidney disease, stage 3b: Secondary | ICD-10-CM | POA: Diagnosis not present

## 2022-05-11 ENCOUNTER — Encounter (HOSPITAL_COMMUNITY): Payer: Self-pay | Admitting: Internal Medicine

## 2022-05-11 ENCOUNTER — Other Ambulatory Visit: Payer: Self-pay

## 2022-05-11 ENCOUNTER — Emergency Department (HOSPITAL_COMMUNITY): Payer: Medicare HMO

## 2022-05-11 ENCOUNTER — Inpatient Hospital Stay (HOSPITAL_COMMUNITY)
Admission: EM | Admit: 2022-05-11 | Discharge: 2022-05-16 | DRG: 292 | Disposition: A | Discharge status: Home Health | Payer: Medicare HMO | Attending: Family Medicine | Admitting: Family Medicine

## 2022-05-11 DIAGNOSIS — I4819 Other persistent atrial fibrillation: Secondary | ICD-10-CM | POA: Diagnosis present

## 2022-05-11 DIAGNOSIS — R7989 Other specified abnormal findings of blood chemistry: Secondary | ICD-10-CM | POA: Diagnosis not present

## 2022-05-11 DIAGNOSIS — I5043 Acute on chronic combined systolic (congestive) and diastolic (congestive) heart failure: Secondary | ICD-10-CM | POA: Diagnosis not present

## 2022-05-11 DIAGNOSIS — R000 Tachycardia, unspecified: Secondary | ICD-10-CM | POA: Diagnosis not present

## 2022-05-11 DIAGNOSIS — I5023 Acute on chronic systolic (congestive) heart failure: Secondary | ICD-10-CM | POA: Diagnosis not present

## 2022-05-11 DIAGNOSIS — J101 Influenza due to other identified influenza virus with other respiratory manifestations: Secondary | ICD-10-CM

## 2022-05-11 DIAGNOSIS — D631 Anemia in chronic kidney disease: Secondary | ICD-10-CM | POA: Diagnosis not present

## 2022-05-11 DIAGNOSIS — E039 Hypothyroidism, unspecified: Secondary | ICD-10-CM | POA: Diagnosis not present

## 2022-05-11 DIAGNOSIS — G8929 Other chronic pain: Secondary | ICD-10-CM | POA: Diagnosis present

## 2022-05-11 DIAGNOSIS — Z885 Allergy status to narcotic agent status: Secondary | ICD-10-CM | POA: Diagnosis not present

## 2022-05-11 DIAGNOSIS — E785 Hyperlipidemia, unspecified: Secondary | ICD-10-CM | POA: Diagnosis not present

## 2022-05-11 DIAGNOSIS — Z888 Allergy status to other drugs, medicaments and biological substances status: Secondary | ICD-10-CM

## 2022-05-11 DIAGNOSIS — X58XXXA Exposure to other specified factors, initial encounter: Secondary | ICD-10-CM | POA: Diagnosis not present

## 2022-05-11 DIAGNOSIS — Z79899 Other long term (current) drug therapy: Secondary | ICD-10-CM | POA: Diagnosis not present

## 2022-05-11 DIAGNOSIS — I083 Combined rheumatic disorders of mitral, aortic and tricuspid valves: Secondary | ICD-10-CM | POA: Diagnosis not present

## 2022-05-11 DIAGNOSIS — I509 Heart failure, unspecified: Secondary | ICD-10-CM

## 2022-05-11 DIAGNOSIS — R69 Illness, unspecified: Secondary | ICD-10-CM | POA: Diagnosis not present

## 2022-05-11 DIAGNOSIS — J90 Pleural effusion, not elsewhere classified: Secondary | ICD-10-CM | POA: Diagnosis not present

## 2022-05-11 DIAGNOSIS — I700 Atherosclerosis of aorta: Secondary | ICD-10-CM | POA: Diagnosis not present

## 2022-05-11 DIAGNOSIS — Z7901 Long term (current) use of anticoagulants: Secondary | ICD-10-CM

## 2022-05-11 DIAGNOSIS — Z743 Need for continuous supervision: Secondary | ICD-10-CM | POA: Diagnosis not present

## 2022-05-11 DIAGNOSIS — Z79890 Hormone replacement therapy (postmenopausal): Secondary | ICD-10-CM

## 2022-05-11 DIAGNOSIS — I5020 Unspecified systolic (congestive) heart failure: Secondary | ICD-10-CM | POA: Diagnosis not present

## 2022-05-11 DIAGNOSIS — I739 Peripheral vascular disease, unspecified: Secondary | ICD-10-CM | POA: Diagnosis present

## 2022-05-11 DIAGNOSIS — N1832 Chronic kidney disease, stage 3b: Secondary | ICD-10-CM | POA: Diagnosis not present

## 2022-05-11 DIAGNOSIS — Z86711 Personal history of pulmonary embolism: Secondary | ICD-10-CM

## 2022-05-11 DIAGNOSIS — R0902 Hypoxemia: Secondary | ICD-10-CM | POA: Diagnosis not present

## 2022-05-11 DIAGNOSIS — Z1152 Encounter for screening for COVID-19: Secondary | ICD-10-CM

## 2022-05-11 DIAGNOSIS — J441 Chronic obstructive pulmonary disease with (acute) exacerbation: Principal | ICD-10-CM | POA: Diagnosis present

## 2022-05-11 DIAGNOSIS — I5041 Acute combined systolic (congestive) and diastolic (congestive) heart failure: Secondary | ICD-10-CM | POA: Diagnosis not present

## 2022-05-11 DIAGNOSIS — M109 Gout, unspecified: Secondary | ICD-10-CM | POA: Diagnosis present

## 2022-05-11 DIAGNOSIS — R0602 Shortness of breath: Secondary | ICD-10-CM | POA: Diagnosis not present

## 2022-05-11 DIAGNOSIS — M4856XA Collapsed vertebra, not elsewhere classified, lumbar region, initial encounter for fracture: Secondary | ICD-10-CM | POA: Diagnosis not present

## 2022-05-11 DIAGNOSIS — L89312 Pressure ulcer of right buttock, stage 2: Secondary | ICD-10-CM | POA: Diagnosis present

## 2022-05-11 DIAGNOSIS — I77810 Thoracic aortic ectasia: Secondary | ICD-10-CM | POA: Diagnosis present

## 2022-05-11 DIAGNOSIS — M549 Dorsalgia, unspecified: Secondary | ICD-10-CM | POA: Diagnosis present

## 2022-05-11 DIAGNOSIS — Z9181 History of falling: Secondary | ICD-10-CM

## 2022-05-11 DIAGNOSIS — I5021 Acute systolic (congestive) heart failure: Secondary | ICD-10-CM | POA: Diagnosis not present

## 2022-05-11 DIAGNOSIS — L89322 Pressure ulcer of left buttock, stage 2: Secondary | ICD-10-CM | POA: Diagnosis present

## 2022-05-11 DIAGNOSIS — Z8673 Personal history of transient ischemic attack (TIA), and cerebral infarction without residual deficits: Secondary | ICD-10-CM

## 2022-05-11 DIAGNOSIS — I10 Essential (primary) hypertension: Secondary | ICD-10-CM | POA: Diagnosis not present

## 2022-05-11 DIAGNOSIS — R5381 Other malaise: Secondary | ICD-10-CM | POA: Diagnosis present

## 2022-05-11 LAB — CBC WITH DIFFERENTIAL/PLATELET
Abs Immature Granulocytes: 0.03 10*3/uL (ref 0.00–0.07)
Basophils Absolute: 0 10*3/uL (ref 0.0–0.1)
Basophils Relative: 0 %
Eosinophils Absolute: 0 10*3/uL (ref 0.0–0.5)
Eosinophils Relative: 0 %
HCT: 40.4 % (ref 36.0–46.0)
Hemoglobin: 12.3 g/dL (ref 12.0–15.0)
Immature Granulocytes: 0 %
Lymphocytes Relative: 11 %
Lymphs Abs: 0.8 10*3/uL (ref 0.7–4.0)
MCH: 27.4 pg (ref 26.0–34.0)
MCHC: 30.4 g/dL (ref 30.0–36.0)
MCV: 90 fL (ref 80.0–100.0)
Monocytes Absolute: 0.5 10*3/uL (ref 0.1–1.0)
Monocytes Relative: 7 %
Neutro Abs: 5.7 10*3/uL (ref 1.7–7.7)
Neutrophils Relative %: 82 %
Platelets: 229 10*3/uL (ref 150–400)
RBC: 4.49 MIL/uL (ref 3.87–5.11)
RDW: 15.5 % (ref 11.5–15.5)
WBC: 7 10*3/uL (ref 4.0–10.5)
nRBC: 0 % (ref 0.0–0.2)

## 2022-05-11 LAB — RESP PANEL BY RT-PCR (RSV, FLU A&B, COVID)  RVPGX2
Influenza A by PCR: POSITIVE — AB
Influenza B by PCR: NEGATIVE
Resp Syncytial Virus by PCR: NEGATIVE
SARS Coronavirus 2 by RT PCR: NEGATIVE

## 2022-05-11 LAB — COMPREHENSIVE METABOLIC PANEL
ALT: 35 U/L (ref 0–44)
AST: 30 U/L (ref 15–41)
Albumin: 3.5 g/dL (ref 3.5–5.0)
Alkaline Phosphatase: 75 U/L (ref 38–126)
Anion gap: 10 (ref 5–15)
BUN: 52 mg/dL — ABNORMAL HIGH (ref 8–23)
CO2: 31 mmol/L (ref 22–32)
Calcium: 8.7 mg/dL — ABNORMAL LOW (ref 8.9–10.3)
Chloride: 99 mmol/L (ref 98–111)
Creatinine, Ser: 1.4 mg/dL — ABNORMAL HIGH (ref 0.44–1.00)
GFR, Estimated: 41 mL/min — ABNORMAL LOW (ref >60)
Glucose, Bld: 107 mg/dL — ABNORMAL HIGH (ref 70–99)
Potassium: 4.6 mmol/L (ref 3.5–5.1)
Sodium: 140 mmol/L (ref 135–145)
Total Bilirubin: 1.4 mg/dL — ABNORMAL HIGH (ref 0.3–1.2)
Total Protein: 7.4 g/dL (ref 6.5–8.1)

## 2022-05-11 LAB — TROPONIN I (HIGH SENSITIVITY)
Troponin I (High Sensitivity): 24 ng/L — ABNORMAL HIGH (ref <18)
Troponin I (High Sensitivity): 25 ng/L — ABNORMAL HIGH (ref <18)

## 2022-05-11 LAB — BRAIN NATRIURETIC PEPTIDE: B Natriuretic Peptide: 790.4 pg/mL — ABNORMAL HIGH (ref 0.0–100.0)

## 2022-05-11 MED ORDER — FUROSEMIDE 10 MG/ML IJ SOLN
40.0000 mg | Freq: Two times a day (BID) | INTRAMUSCULAR | Status: DC
  Administered 2022-05-12 – 2022-05-15 (×7): 40 mg via INTRAVENOUS
  Filled 2022-05-11 (×7): qty 4

## 2022-05-11 MED ORDER — ALBUTEROL SULFATE (2.5 MG/3ML) 0.083% IN NEBU
2.5000 mg | INHALATION_SOLUTION | RESPIRATORY_TRACT | Status: DC | PRN
  Administered 2022-05-12 – 2022-05-16 (×6): 2.5 mg via RESPIRATORY_TRACT
  Filled 2022-05-11 (×9): qty 3

## 2022-05-11 MED ORDER — ACETAMINOPHEN 650 MG RE SUPP: 650.0000 mg | Freq: Four times a day (QID) | RECTAL | Status: DC | PRN

## 2022-05-11 MED ORDER — FUROSEMIDE 10 MG/ML IJ SOLN
40.0000 mg | Freq: Once | INTRAMUSCULAR | Status: AC
  Administered 2022-05-11: 40 mg via INTRAVENOUS
  Filled 2022-05-11: qty 4

## 2022-05-11 MED ORDER — APIXABAN 5 MG PO TABS
5.0000 mg | ORAL_TABLET | Freq: Two times a day (BID) | ORAL | Status: DC
  Administered 2022-05-11 – 2022-05-16 (×10): 5 mg via ORAL
  Filled 2022-05-11 (×10): qty 1

## 2022-05-11 MED ORDER — ONDANSETRON HCL 4 MG PO TABS: 4.0000 mg | ORAL_TABLET | Freq: Four times a day (QID) | ORAL | Status: DC | PRN

## 2022-05-11 MED ORDER — LEVOTHYROXINE SODIUM 50 MCG PO TABS
50.0000 ug | ORAL_TABLET | Freq: Every day | ORAL | Status: DC
  Administered 2022-05-12 – 2022-05-15 (×4): 50 ug via ORAL
  Filled 2022-05-11 (×4): qty 1

## 2022-05-11 MED ORDER — OSELTAMIVIR PHOSPHATE 30 MG PO CAPS
30.0000 mg | ORAL_CAPSULE | Freq: Two times a day (BID) | ORAL | Status: AC
  Administered 2022-05-11 – 2022-05-16 (×10): 30 mg via ORAL
  Filled 2022-05-11 (×10): qty 1

## 2022-05-11 MED ORDER — ONDANSETRON HCL 4 MG/2ML IJ SOLN: 4.0000 mg | Freq: Four times a day (QID) | INTRAMUSCULAR | Status: DC | PRN

## 2022-05-11 MED ORDER — EZETIMIBE 10 MG PO TABS
10.0000 mg | ORAL_TABLET | Freq: Every day | ORAL | Status: DC
  Administered 2022-05-11: 10 mg via ORAL
  Filled 2022-05-11 (×3): qty 1

## 2022-05-11 MED ORDER — BISOPROLOL FUMARATE 5 MG PO TABS
5.0000 mg | ORAL_TABLET | Freq: Every day | ORAL | Status: DC
  Administered 2022-05-11 – 2022-05-16 (×6): 5 mg via ORAL
  Filled 2022-05-11 (×6): qty 1

## 2022-05-11 MED ORDER — ACETAMINOPHEN 325 MG PO TABS
650.0000 mg | ORAL_TABLET | Freq: Four times a day (QID) | ORAL | Status: DC | PRN
  Administered 2022-05-11 – 2022-05-12 (×3): 650 mg via ORAL
  Filled 2022-05-11 (×3): qty 2

## 2022-05-11 NOTE — ED Triage Notes (Addendum)
BIBA from home with c/o sob x1 week.  Returned home from rehab 2 weeks ago for L2 fracture and PE and on eliquis.  Hx CHF and reports abd distention.  Reports not taking all of her meds since being home.  Hx a-fib.  Pt talking in full sentences

## 2022-05-11 NOTE — ED Provider Triage Note (Signed)
Emergency Medicine Provider Triage Evaluation Note  Cynthia Cook , a 67 y.o. female  was evaluated in triage.  Pt complains of shortness of breath worsening for the past 3 days.  She states she has known pulmonary embolus and has been on Eliquis but states she was not sent home from rehab with her medications so she has not had a dose of Eliquis in 3 days.  Having more shortness of breath along with some abdominal edema  Review of Systems  Positive: sob Negative: fever  Physical Exam  BP 122/77 (BP Location: Left Arm)   Pulse 85   Temp (!) 97.5 F (36.4 C) (Oral)   Resp (!) 24   SpO2 100%  Gen:   Awake, no distress   Resp:  Normal effort  MSK:   Moves extremities without difficulty  Other:    Medical Decision Making  Medically screening exam initiated at 2:35 PM.  Appropriate orders placed.  Cynthia Cook was informed that the remainder of the evaluation will be completed by another provider, this initial triage assessment does not replace that evaluation, and the importance of remaining in the ED until their evaluation is complete.     Gwenevere Abbot, [place] 05/11/22 1439

## 2022-05-11 NOTE — ED Provider Notes (Addendum)
[place] EMERGENCY DEPARTMENT AT [facility] Provider Note  CSN: [id number] Arrival date & time: 05/11/22  1404  History Chief Complaint  Patient presents with   Shortness of Breath   Cynthia Cook is a 67 y.o. female.  Cynthia Cook is an 67 yo woman presenting with one week new-onset shortness of breath. Recently hospitalized at [facility] 1/20 - 1/26 for severe right-sided PE and worsened HFrEF given PE and known A-fib. Was started on Eliquis and discharged to rehab. Patient has been home from rehab for 3 days now. She reports while at SNF, she was inconsistently given her medications and that sometimes they were the wrong doses. Was sent home with only a handful of pills; she ran out of Eliquis yesterday and the BB the day before. She has been sleeping in the recliner, but this is more for her back pain 2/2 L2 compression fracture than for orthopnea.   Known PMH A. Fib, HFrEF 30-35%, global hypokinesis of LV, moderate LVH, severely dilated atria, moderate mitral regurg severe mitral annular calcification, moderate aortic valve stenosis, PE, L2 compression fracture, CVA, subdural hematoma, CKD 3, hypothyroidism 2/2 Hashimoto's thyroiditis.   Home Medications Prior to Admission medications   Medication Sig Start Date End Date Taking? Authorizing Provider  apixaban (ELIQUIS) 5 MG TABS tablet Take 2 tablets (10 mg total) by mouth 2 (two) times daily for 3 days, THEN 1 tablet (5 mg total) 2 (two) times daily. 05/05/22 06/07/22  Elodia Florence., MD  Ascorbic Acid (VITAMIN C) 1000 MG tablet Take 4,000 mg by mouth daily.    [provider]  b complex vitamins capsule Take 1 capsule by mouth daily.    [provider]  bisoprolol (ZEBETA) 5 MG tablet Take 1 tablet (5 mg total) by mouth daily. 05/06/22 07/05/22  Elodia Florence., MD  ezetimibe (ZETIA) 10 MG tablet Take 1 tablet (10 mg total) by mouth daily. 05/05/22 07/04/22  Elodia Florence., MD  hydrogen  peroxide (PEROXYL) 1.5 % SOLN Gargle, swish and spit daily Patient taking differently: 1 Capful See admin instructions. Gargle, swish and spit 1 capful once daily. 04/24/22   Brunetta Jeans, PA-C  levothyroxine (SYNTHROID) 50 MCG tablet Take 1 tablet (50 mcg) by mouth on Monday, Wednesday, and Friday Patient taking differently: Take 50 mcg by mouth See admin instructions. 50 mcg once daily on Monday, Wednesday, Friday. 06/28/21   Orma Render, NP  levothyroxine (SYNTHROID) 75 MCG tablet Take 1 tablet (75 mcg) by mouth on Tuesday, Thursday, Saturday, and Sunday Patient taking differently: Take 75 mcg by mouth See admin instructions. 75 mcg once daily on Sunday, Tuesday, Thursday, Saturday. 06/28/21   Orma Render, NP  OVER THE COUNTER MEDICATION Take 1 tablet by mouth daily with supper. Calcium + D3 + K2    [provider]  predniSONE (DELTASONE) 10 MG tablet Take 5 tablets (50 mg total) by mouth daily with breakfast for 2 days, THEN 4 tablets (40 mg total) daily with breakfast for 3 days, THEN 3 tablets (30 mg total) daily with breakfast for 2 days, THEN 2 tablets (20 mg total) daily with breakfast for 2 days, THEN 1 tablet (10 mg total) daily with breakfast for 2 days. 05/06/22 05/17/22  Elodia Florence., MD  vitamin E 180 MG (400 UNITS) capsule Take 400 Units by mouth daily.    [provider]      Allergies    Codeine, Duragesic-100 [fentanyl], Mucinex [  guaifenesin er], and Nsaids    Review of Systems   Review of Systems  Constitutional:  Positive for activity change and fatigue. Negative for diaphoresis and fever.  HENT:  Negative for congestion, nosebleeds and sinus pain.   Respiratory:  Positive for cough and shortness of breath. Negative for choking, chest tightness, wheezing and stridor.   Cardiovascular:  Positive for leg swelling. Negative for chest pain.  Gastrointestinal:  Positive for abdominal distention.  Musculoskeletal:  Positive for back pain.    Physical Exam Updated Vital Signs BP (!) 128/102 (BP Location: Right Arm)   Pulse (!) 104   Temp 98.2 F (36.8 C)   Resp (!) 24   SpO2 99%  Physical Exam Constitutional:      General: She is not in acute distress.    Appearance: She is obese. She is ill-appearing.  HENT:     Head: Normocephalic.  Cardiovascular:     Rate and Rhythm: Tachycardia present. Rhythm irregular.     Heart sounds: Murmur heard.  Pulmonary:     Effort: Pulmonary effort is normal. No tachypnea.     Breath sounds: Examination of the right-lower field reveals rhonchi. Examination of the left-lower field reveals rhonchi. Rhonchi present.  Chest:     Chest wall: No tenderness or edema.  Abdominal:     Palpations: Abdomen is soft.  Musculoskeletal:     Right lower leg: Edema (2+ bilaterally) present.     Left lower leg: Edema present.  Skin:    General: Skin is warm.  Neurological:     General: No focal deficit present.     Mental Status: She is alert.    ED Results / Procedures / Treatments   Labs (all labs ordered are listed, but only abnormal results are displayed) Labs Reviewed  BRAIN NATRIURETIC PEPTIDE - Abnormal; Notable for the following components:      Result Value   B Natriuretic Peptide 790.4 (*)    All other components within normal limits  COMPREHENSIVE METABOLIC PANEL - Abnormal; Notable for the following components:   Glucose, Bld 107 (*)    BUN 52 (*)    Creatinine, Ser 1.40 (*)    Calcium 8.7 (*)    Total Bilirubin 1.4 (*)    GFR, Estimated 41 (*)    All other components within normal limits  TROPONIN I (HIGH SENSITIVITY) - Abnormal; Notable for the following components:   Troponin I (High Sensitivity) 24 (*)    All other components within normal limits  RESP PANEL BY RT-PCR (RSV, FLU A&B, COVID)  [id number]  CBC WITH DIFFERENTIAL/PLATELET  TROPONIN I (HIGH SENSITIVITY)   EKG EKG Interpretation  Date/Time:  Thursday May 11 2022 14:17:17 EST Ventricular Rate:   102 PR Interval:    QRS Duration: 108 QT Interval:  349 QTC Calculation: 455 R Axis:   -48 Text Interpretation: Atrial fibrillation Left axis deviation Borderline T abnormalities, diffuse leads Since last tracing rate slower Confirmed by Isla Pence [phone]) on 05/11/2022 3:32:42 PM  Radiology DG Chest Port 1 View  Result Date: 05/11/2022 CLINICAL DATA:  Shortness of breath worsening over the last 3 days, extensive pulmonary embolus diagnosed on 04/29/2022 (treated with Eliquis, although reportedly the patient is not taking the medication for 3 days). EXAM: PORTABLE CHEST 1 VIEW COMPARISON:  Chest CT 04/29/2022 FINDINGS: Blunted right costophrenic angle compatible with small to moderate right pleural effusion. There is some subtle blunting of the left lateral costophrenic angle suggesting trace left pleural effusion. The patient  is rotated to the right on today's radiograph, reducing diagnostic sensitivity and specificity. Reverse lordotic projection. Atherosclerotic calcification of the aortic arch. Mild enlargement of the cardiopericardial silhouette. Markedly striking mitral valve calcification. No new airspace opacity is identified. IMPRESSION: 1. Small to moderate right pleural effusion. 2. Trace left pleural effusion. 3. Mild enlargement of the cardiopericardial silhouette. 4. Markedly striking mitral valve calcification. 5. Atherosclerotic calcification of the aortic arch. 6. Reverse lordotic projection. Electronically Signed   By: Van Clines M.D.   On: 05/11/2022 15:00    Procedures Procedures   Medications Ordered in ED Medications  apixaban (ELIQUIS) tablet 5 mg (has no administration in time range)  bisoprolol (ZEBETA) tablet 5 mg (has no administration in time range)  ezetimibe (ZETIA) tablet 10 mg (has no administration in time range)  levothyroxine (SYNTHROID) tablet 50 mcg (has no administration in time range)  furosemide (LASIX) injection 40 mg (40 mg Intravenous Given  05/11/22 1555)   ED Course/ Medical Decision Making/ A&P                 Medical Decision Making 67 yo woman here with HFrEF exacerbation 2/2 to sub-optimal medication administration. Respiratory status stable on room air with normal SpO2. Fluid overloaded with 2+ BLE edema, SOB, wet cough, and rhonchi of bilateral posterior lung bases on physical exam. Started appropriate home meds, which curiously do not include a diuretic. Will start IV lasix 40 mg, creatinine is at her baseline. CXR shows pleural effusions (small to mod on right, trace on left) with mild enlargement of cardiac silhouette. Ordered lasix IV 40 mg and appropriate home meds. Although she is hemodynamically stable at this time, I do believe she needs inpatient admission for aggressive diuresis prior to discharge home.   4:40 pm: Discussed case with admitting hospitalist. They will admit.   Amount and/or Complexity of Data Reviewed Labs: ordered. Radiology: ordered.  Risk Prescription drug management. Decision regarding hospitalization.   Final Clinical Impression(s) / ED Diagnoses Final diagnoses:  COPD exacerbation [facility])    Rx / DC Orders ED Discharge Orders     None      Ezequiel Essex, MD   Ezequiel Essex, MD 05/11/22 1641    Ezequiel Essex, MD 05/11/22 [phone]

## 2022-05-11 NOTE — H&P (Signed)
History and Physical    Cynthia Cook BJY:[id number] DOB: [birth year] DOA: 05/11/2022  PCP: Lurline Del, DO  Patient coming from: home  I have personally briefly reviewed patient's old medical records in [place]  Chief Complaint: sob , lower extremity swelling , abdominal distention x 1 week  HPI: Cynthia Cook is a 67 y.o. female with medical history significant of  67 y.o. female with medical history significant of paroxysmal atrial fibrillation, prior PE s/p treatment not on [facility],  history of CVA, subdural hematoma, hypothyroidism, chronic back pain due to fracture of lumbar vertebrae, Hfref 30-35%,PVD,Gout, CKDIIIb,  with recent interim history of admission 04/16/22-04/29/22 with diagnosis of Acute PE s/p which patient was discharged to rehab. Patient now return to ED 3 days s/p discharged from rehab with complaint of increasing sob, lower extermity edema , abdominal distention x 1 week. 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  She believes this is the cause of he current symptoms. She notes no fever/chills/ n/v/ diarrhea/ dysuria/ or chest pain. She notes that s/p lasix in ED she feel much improved.   ED Course:  Afeb, BP 122/77, HR 85, rr 24 , sat 100%  YQM:VHQION fibrillation, LAD cxr IMPRESSION: 1. Small to moderate right pleural effusion. 2. Trace left pleural effusion. 3. Mild enlargement of the cardiopericardial silhouette. 4. Markedly striking mitral valve calcification. 5. Atherosclerotic calcification of the aortic arch. 6. Reverse lordotic projection.   Wbc: 7, hgb 12.3, plt229,  CE 24 NA 140, K 4.6, CL 99, Glu 107, Cr 1.4 BNP 790.4 CE25  Respiratory panel + influenza A Review of Systems: As per HPI otherwise 10 point review of systems negative.   Past Medical History:  Diagnosis Date   Back pain    Gout    Murmur    PE (pulmonary embolism)    Persistent atrial fibrillation (HCC)     Pulmonary emboli (HCC) 02/05/2020   Syncope    Thyroid disease     No past surgical history on file.   reports that she has never smoked. She has never used smokeless tobacco. She reports that she does not drink alcohol and does not use drugs.  Allergies  Allergen Reactions   Codeine Nausea Only   Duragesic-100 [Fentanyl] Nausea And Vomiting    Dizziness Lightheadedness    Mucinex [Guaifenesin Er] Other (See Comments)    Syncope    Nsaids Other (See Comments)    Kidney disease    Family History  Family history unknown: Yes   Prior to Admission medications   Medication Sig Start Date End Date Taking? Authorizing Provider  apixaban (ELIQUIS) 5 MG TABS tablet Take 2 tablets (10 mg total) by mouth 2 (two) times daily for 3 days, THEN 1 tablet (5 mg total) 2 (two) times daily. 05/05/22 06/07/22  Elodia Florence., MD  Ascorbic Acid (VITAMIN C) 1000 MG tablet Take 4,000 mg by mouth daily.    [provider]  b complex vitamins capsule Take 1 capsule by mouth daily.    [provider]  bisoprolol (ZEBETA) 5 MG tablet Take 1 tablet (5 mg total) by mouth daily. 05/06/22 07/05/22  Elodia Florence., MD  ezetimibe (ZETIA) 10 MG tablet Take 1 tablet (10 mg total) by mouth daily. 05/05/22 07/04/22  Elodia Florence., MD  hydrogen peroxide (PEROXYL) 1.5 % SOLN Gargle, swish and spit daily Patient taking differently: 1 Capful  See admin instructions. Gargle, swish and spit 1 capful once daily. 04/24/22   Brunetta Jeans, PA-C  levothyroxine (SYNTHROID) 50 MCG tablet Take 1 tablet (50 mcg) by mouth on Monday, Wednesday, and Friday Patient taking differently: Take 50 mcg by mouth See admin instructions. 50 mcg once daily on Monday, Wednesday, Friday. 06/28/21   Orma Render, NP  levothyroxine (SYNTHROID) 75 MCG tablet Take 1 tablet (75 mcg) by mouth on Tuesday, Thursday, Saturday, and Sunday Patient taking differently: Take 75 mcg by mouth See admin instructions. 75 mcg  once daily on Sunday, Tuesday, Thursday, Saturday. 06/28/21   Orma Render, NP  OVER THE COUNTER MEDICATION Take 1 tablet by mouth daily with supper. Calcium + D3 + K2    [provider]  predniSONE (DELTASONE) 10 MG tablet Take 5 tablets (50 mg total) by mouth daily with breakfast for 2 days, THEN 4 tablets (40 mg total) daily with breakfast for 3 days, THEN 3 tablets (30 mg total) daily with breakfast for 2 days, THEN 2 tablets (20 mg total) daily with breakfast for 2 days, THEN 1 tablet (10 mg total) daily with breakfast for 2 days. 05/06/22 05/17/22  Elodia Florence., MD  vitamin E 180 MG (400 UNITS) capsule Take 400 Units by mouth daily.    [provider]    Physical Exam: Vitals:   05/11/22 1416 05/11/22 1529  BP: 122/77 (!) 128/102  Pulse: 85 (!) 104  Resp: (!) 24 (!) 24  Temp: (!) 97.5 F (36.4 C) 98.2 F (36.8 C)  TempSrc: Oral   SpO2: 100% 99%    Constitutional: NAD, calm, comfortable Vitals:   05/11/22 1416 05/11/22 1529  BP: 122/77 (!) 128/102  Pulse: 85 (!) 104  Resp: (!) 24 (!) 24  Temp: (!) 97.5 F (36.4 C) 98.2 F (36.8 C)  TempSrc: Oral   SpO2: 100% 99%   Eyes: PERRL, lids and conjunctivae normal ENMT: Mucous membranes are moist. Posterior pharynx clear of any exudate or lesions.Normal dentition.  Neck: normal, supple, no masses, no thyromegaly Respiratory: clear to auscultation bilaterally, no wheezing, diminished at bases. Normal respiratory effort. No accessory muscle use.  Cardiovascular: Regular rate and rhythm, no murmurs / rubs / gallops. + extremity edema. Warm extremities Abdomen: no tenderness, no masses palpated. No hepatosplenomegaly. Bowel sounds positive.  Musculoskeletal: no clubbing / cyanosis. No joint deformity upper and lower extremities. Good ROM, no contractures. Normal muscle tone.  Skin: no rashes, lesions, ulcers. No induration,chronic venostasis changes lower extremity Neurologic: CN 2-12 grossly intact. Sensation  intact, . Strength 5/5 in all 4.  Psychiatric: Normal judgment and insight. Alert and oriented x 3. Normal mood.    Labs on Admission: I have personally reviewed following labs and imaging studies  CBC: Recent Labs  Lab 05/05/22 0208 05/11/22 1505  WBC 10.9* 7.0  NEUTROABS  --  5.7  HGB 11.7* 12.3  HCT 35.4* 40.4  MCV 85.7 90.0  PLT 329 903   Basic Metabolic Panel: Recent Labs  Lab 05/05/22 0208 05/11/22 1505  NA 138 140  K 4.5 4.6  CL 98 99  CO2 28 31  GLUCOSE 133* 107*  BUN 77* 52*  CREATININE 1.93* 1.40*  CALCIUM 9.1 8.7*  MG 2.3  --   PHOS 4.0  --    GFR: Estimated Creatinine Clearance: 40.6 mL/min (A) (by C-G formula based on SCr of 1.4 mg/dL (H)). Liver Function Tests: Recent Labs  Lab 05/11/22 1505  AST 30  ALT  35  ALKPHOS 75  BILITOT 1.4*  PROT 7.4  ALBUMIN 3.5   No results for input(s): "LIPASE", "AMYLASE" in the last 168 hours. No results for input(s): "AMMONIA" in the last 168 hours. Coagulation Profile: No results for input(s): "INR", "PROTIME" in the last 168 hours. Cardiac Enzymes: No results for input(s): "CKTOTAL", "CKMB", "CKMBINDEX", "TROPONINI" in the last 168 hours. BNP (last 3 results) No results for input(s): "PROBNP" in the last 8760 hours. HbA1C: No results for input(s): "HGBA1C" in the last 72 hours. CBG: No results for input(s): "GLUCAP" in the last 168 hours. Lipid Profile: No results for input(s): "CHOL", "HDL", "LDLCALC", "TRIG", "CHOLHDL", "LDLDIRECT" in the last 72 hours. Thyroid Function Tests: No results for input(s): "TSH", "T4TOTAL", "FREET4", "T3FREE", "THYROIDAB" in the last 72 hours. Anemia Panel: No results for input(s): "VITAMINB12", "FOLATE", "FERRITIN", "TIBC", "IRON", "RETICCTPCT" in the last 72 hours. Urine analysis:    Component Value Date/Time   COLORURINE YELLOW 04/29/2022 1949   APPEARANCEUR HAZY (A) 04/29/2022 1949   LABSPEC 1.015 04/29/2022 1949   PHURINE 5.0 04/29/2022 1949   GLUCOSEU NEGATIVE  04/29/2022 1949   HGBUR NEGATIVE 04/29/2022 1949   BILIRUBINUR NEGATIVE 04/29/2022 1949   [place] NEGATIVE 04/29/2022 1949   PROTEINUR NEGATIVE 04/29/2022 1949   NITRITE NEGATIVE 04/29/2022 1949   LEUKOCYTESUR SMALL (A) 04/29/2022 1949    Radiological Exams on Admission: DG Chest Port 1 View  Result Date: 05/11/2022 CLINICAL DATA:  Shortness of breath worsening over the last 3 days, extensive pulmonary embolus diagnosed on 04/29/2022 (treated with Eliquis, although reportedly the patient is not taking the medication for 3 days). EXAM: PORTABLE CHEST 1 VIEW COMPARISON:  Chest CT 04/29/2022 FINDINGS: Blunted right costophrenic angle compatible with small to moderate right pleural effusion. There is some subtle blunting of the left lateral costophrenic angle suggesting trace left pleural effusion. The patient is rotated to the right on today's radiograph, reducing diagnostic sensitivity and specificity. Reverse lordotic projection. Atherosclerotic calcification of the aortic arch. Mild enlargement of the cardiopericardial silhouette. Markedly striking mitral valve calcification. No new airspace opacity is identified. IMPRESSION: 1. Small to moderate right pleural effusion. 2. Trace left pleural effusion. 3. Mild enlargement of the cardiopericardial silhouette. 4. Markedly striking mitral valve calcification. 5. Atherosclerotic calcification of the aortic arch. 6. Reverse lordotic projection. Electronically Signed   By: Van Clines M.D.   On: 05/11/2022 15:00    EKG: Independently reviewed. See above  Assessment/Plan Acute exacerbation of CHFref  -ef30-35%  -admit progressive care -lasix '40mg'$  iv bid  -strict I/o/daily weights -echo in am  -consider cardiology if patient does not continue to improve   Influenza A -patient with worsening symptoms with last 72 hours  -will start Tamiflu -supportive care  -patient currently w/o fever, uri sxs  this is thought to be an early infection  with worsening chf symptoms x 3 days.   PAF -continue AC -currently rate controlled  -on zabeta  PE -dx 04/16/22 -continue on Eliquis -stable sat on ra   Elevated CE  -appear chronic leak , 24  -patient denies chest pain -EKG w/o hyperacute findings   Normocytic Anemia -hgb stable ,continue to monitor   CKDIIIb -at baseline   Hx of CVA and subdural hematomas -no active issue  -stable on Eliquis    Hypothyroidism  -resume synthroid    DVT prophylaxis: on Eliquis Code Status: full/ as discussed per patient wishes in event of cardiac arrest (patient would like further discussion in am , palliative care consult placed)  Family Communication: none at bedside Disposition Plan: patient  expected to be admitted greater than 2 midnights  Consults called:palliative care Admission status: progressive   Clance Boll MD [facility]   If 7PM-7AM, please contact night-coverage [email or web address] Password [facility]  05/11/2022, 7:04 PM

## 2022-05-12 ENCOUNTER — Inpatient Hospital Stay (HOSPITAL_COMMUNITY): Payer: Medicare HMO

## 2022-05-12 ENCOUNTER — Encounter (HOSPITAL_COMMUNITY): Payer: Self-pay | Admitting: Internal Medicine

## 2022-05-12 DIAGNOSIS — I5021 Acute systolic (congestive) heart failure: Secondary | ICD-10-CM

## 2022-05-12 DIAGNOSIS — I5023 Acute on chronic systolic (congestive) heart failure: Secondary | ICD-10-CM | POA: Diagnosis not present

## 2022-05-12 LAB — COMPREHENSIVE METABOLIC PANEL
ALT: 27 U/L (ref 0–44)
AST: 24 U/L (ref 15–41)
Albumin: 3 g/dL — ABNORMAL LOW (ref 3.5–5.0)
Alkaline Phosphatase: 62 U/L (ref 38–126)
Anion gap: 12 (ref 5–15)
BUN: 56 mg/dL — ABNORMAL HIGH (ref 8–23)
CO2: 31 mmol/L (ref 22–32)
Calcium: 8.7 mg/dL — ABNORMAL LOW (ref 8.9–10.3)
Chloride: 97 mmol/L — ABNORMAL LOW (ref 98–111)
Creatinine, Ser: 1.56 mg/dL — ABNORMAL HIGH (ref 0.44–1.00)
GFR, Estimated: 36 mL/min — ABNORMAL LOW (ref >60)
Glucose, Bld: 90 mg/dL (ref 70–99)
Potassium: 4.7 mmol/L (ref 3.5–5.1)
Sodium: 140 mmol/L (ref 135–145)
Total Bilirubin: 1.4 mg/dL — ABNORMAL HIGH (ref 0.3–1.2)
Total Protein: 6.3 g/dL — ABNORMAL LOW (ref 6.5–8.1)

## 2022-05-12 LAB — ECHOCARDIOGRAM COMPLETE
AR max vel: 1.2 cm2
AV Area VTI: 1.18 cm2
AV Area mean vel: 1.13 cm2
AV Mean grad: 6 mmHg
AV Peak grad: 9.7 mmHg
Ao pk vel: 1.56 m/s
Area-P 1/2: 8.11 cm2
Height: 66 in
MV VTI: 1.09 cm2
S' Lateral: 3.61 cm
Weight: 2660.8 oz

## 2022-05-12 LAB — CBC
HCT: 36.7 % (ref 36.0–46.0)
Hemoglobin: 11 g/dL — ABNORMAL LOW (ref 12.0–15.0)
MCH: 26.6 pg (ref 26.0–34.0)
MCHC: 30 g/dL (ref 30.0–36.0)
MCV: 88.9 fL (ref 80.0–100.0)
Platelets: 205 10*3/uL (ref 150–400)
RBC: 4.13 MIL/uL (ref 3.87–5.11)
RDW: 15.6 % — ABNORMAL HIGH (ref 11.5–15.5)
WBC: 7.8 10*3/uL (ref 4.0–10.5)
nRBC: 0 % (ref 0.0–0.2)

## 2022-05-12 MED ORDER — LIDOCAINE 5 % EX PTCH
1.0000 | MEDICATED_PATCH | CUTANEOUS | Status: AC
  Administered 2022-05-12: 1 via TRANSDERMAL
  Filled 2022-05-12: qty 1

## 2022-05-12 MED ORDER — IPRATROPIUM-ALBUTEROL 0.5-2.5 (3) MG/3ML IN SOLN
3.0000 mL | Freq: Three times a day (TID) | RESPIRATORY_TRACT | Status: DC
  Administered 2022-05-12 – 2022-05-13 (×4): 3 mL via RESPIRATORY_TRACT
  Filled 2022-05-12 (×7): qty 3

## 2022-05-12 MED ORDER — OXYCODONE HCL 5 MG PO TABS
5.0000 mg | ORAL_TABLET | Freq: Once | ORAL | Status: AC
  Administered 2022-05-12: 5 mg via ORAL
  Filled 2022-05-12: qty 1

## 2022-05-12 MED ORDER — TRAMADOL HCL 50 MG PO TABS
25.0000 mg | ORAL_TABLET | Freq: Once | ORAL | Status: AC
  Administered 2022-05-12: 25 mg via ORAL
  Filled 2022-05-12: qty 1

## 2022-05-12 NOTE — Progress Notes (Signed)
Heart Failure Nurse Navigator Progress Note   Patient EF 30-35%, Hs a follow up appointment scheduled with HF TOC on 05/22/2022.   Navigator will sign off at this time.   Earnestine Leys, BSN, Clinical cytogeneticist Only

## 2022-05-12 NOTE — TOC Initial Note (Signed)
Transition of Care [facility]) - Initial/Assessment Note    Patient Details  Name: Cynthia Cook MRN: [id number] Date of Birth: Aug 23, 1955  [facility]) CM/SW Contact:    Illene Regulus, LCSW Phone Number: 05/12/2022, 10:19 AM  Clinical Narrative:                  CSW received consult for Heart failure screen. Per note pt has follow up appointment. No additional needs. If new patient transition needs arise, please place a TOC consult.          Patient Goals and CMS Choice            Expected Discharge Plan and Services                                              Prior Living Arrangements/Services                       Activities of Daily Living Home Assistive Devices/Equipment: Cane (specify quad or straight) ADL Screening (condition at time of admission) Patient's cognitive ability adequate to safely complete daily activities?: Yes Is the patient deaf or have difficulty hearing?: No Does the patient have difficulty seeing, even when wearing glasses/contacts?: No Does the patient have difficulty concentrating, remembering, or making decisions?: No Patient able to express need for assistance with ADLs?: Yes Does the patient have difficulty dressing or bathing?: Yes Independently performs ADLs?: No Communication: Independent Dressing (OT): Needs assistance Is this a change from baseline?: Pre-admission baseline Grooming: Needs assistance Is this a change from baseline?: Pre-admission baseline Feeding: Independent Bathing: Needs assistance Is this a change from baseline?: Pre-admission baseline Toileting: Needs assistance Is this a change from baseline?: Pre-admission baseline In/Out Bed: Needs assistance Is this a change from baseline?: Pre-admission baseline Walks in Home: Needs assistance Is this a change from baseline?: Pre-admission baseline Does the patient have difficulty walking or climbing stairs?: Yes Weakness of  Legs: Both Weakness of Arms/Hands: Both  Permission Sought/Granted                  Emotional Assessment              Admission diagnosis:  COPD exacerbation ([place]) [J44.1] CHF exacerbation ([place]) [I50.9] Patient Active Problem List   Diagnosis Date Noted   CHF exacerbation ([place]) 05/11/2022   Acute combined systolic and diastolic heart failure ([place]) 05/02/2022   Paroxysmal atrial fibrillation with RVR ([place]) 05/02/2022   Gout flare 05/02/2022   CHF (congestive heart failure) ([place]) 04/29/2022   History of CVA (cerebrovascular accident) 04/29/2022   History of pulmonary embolism 04/29/2022   Lumbar compression fracture ([place]) 04/29/2022   Normocytic anemia 04/29/2022   Excessive daytime sleepiness 02/10/2022   Elevated blood pressure reading 02/10/2022   Subdural hematoma (HCC)    CVA (cerebral vascular accident) ([place]) 12/30/2021   Elevated blood pressure reading with diagnosis of hypertension 10/26/2021   Urine output low 10/26/2021   Reactive depression (situational) 10/26/2021   Eczema intertrigo 06/28/2021   Varicose veins of both lower extremities 06/28/2021   Financial insecurity 06/28/2021   Encounter for medical examination to establish care 02/17/2021   Stroke ([place]) 01/04/2021   Acute CVA (cerebrovascular accident) ([place]) 01/03/2021   CKD (chronic kidney disease) stage 3, GFR 30-59 ml/min ([place]) - baseline SCr 1.3 01/03/2021   Hashimoto's thyroiditis 06/29/2020  Elevated troponin I level    Atrial fibrillation ([place]) 02/17/2020   Hypothyroidism 02/06/2020   Pulmonary embolism ([place]) 02/06/2020   PCP:  Lurline Del, DO Pharmacy:   [id number] [place] [id number]: [phone]: [phone]    Social Determinants of Health (SDOH) Social History: SDOH Screenings   Food Insecurity: No Food Insecurity (05/11/2022)  Housing: Low Risk  (05/11/2022)  Transportation Needs: No  Transportation Needs (05/11/2022)  Utilities: Not At Risk (05/11/2022)  Alcohol Screen: Low Risk  (05/04/2022)  Depression (PHQ2-9): Medium Risk (02/18/2021)  Financial Resource Strain: Low Risk  (05/04/2022)  Stress: Stress Concern Present (06/14/2021)  Tobacco Use: Low Risk  (05/11/2022)   SDOH Interventions: Housing Interventions: Intervention Not Indicated   Readmission Risk Interventions     No data to display

## 2022-05-12 NOTE — Progress Notes (Signed)
Echocardiogram not complete per patient request. Patient requested echo  be performed after her food digests. Will re-attempt as the schedule permits.   Darlina Sicilian RDCS

## 2022-05-12 NOTE — Progress Notes (Signed)
The patient complained of 7/10 unimproved lower back pain. Hospitalist provider contacted, V.O received to administer Oxycodone 5 mg once now.

## 2022-05-12 NOTE — Progress Notes (Signed)
PROGRESS NOTE    Cynthia Cook  VOZ:[id number] DOB: 10-07-1955 DOA: 05/11/2022 PCP: Lurline Del, DO    Brief Narrative:  This 67 years old female with PMH significant for paroxysmal A-fib, prior PE s/p treatment not on anticoagulation, history of CVA, subdural hematoma, hypothyroidism, chronic back pain due to fracture of lumbar vertebrae, HFrEF 30 to 35%, PVD, gout, CKD stage IIIb with recent interim history of admission 04/16/2022 to 04/29/2022 with diagnosis of acute PE s/p which patient was discharged to rehab.  Patient now returned to the ED 3 days after discharge from rehab with complaints of increasing shortness of breath, lower extremity edema, abdominal distention for 1 week.  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  She believes this is the cause of her current symptoms.  She feels better status post lasix  given in the ED.  She is admitted for further evaluation.  Echocardiogram is pending.  Respiratory panel influenza A positive.  Assessment & Plan:   Principal Problem:   CHF exacerbation ([place])  Acute exacerbation of HFr EF: LVEF 30-35% Continue Lasix 40 mg IV twice daily Daily weight, daily intake output charting Obtain 2D echocardiogram. Cardiology consult if patient does not continue to improve.  Influenza A+ Patient presented with worsening symptoms in the last 72 hours. Continue Tamiflu for 5 days. Continue supportive care. Patient without fever, URI symptoms.  Paroxysmal atrial fibrillation: Heart rate well-controlled.   Continue Zebeta and Eliquis.   History of PE: Diagnosed on 04/16/22 Continue Eliquis. She remains on room air.  Elevated troponin: Appears chronically weak, Trop 24 Patient denies any chest pain, EKG without acute changes  Normocytic anemia: Hb stable. Continue to monitor.  CKD IIIb: Serum creatinine at baseline. Avoid nephrotoxic medications.   Hx of CVA and  subdural hematomas No active issues.  Stable on Eliquis.   Hypothyroidism; Continue synthroid.   DVT prophylaxis: Eliquis Code Status: Full code. Family Communication: No family at bed side. Disposition Plan:   Status is: Inpatient Remains inpatient appropriate because: Admitted for acute systolic CHF requiring IV Lasix.  Echocardiogram is ordered report pending.    Consultants:  None  Procedures: Echo  Antimicrobials:  Anti-infectives (From admission, onward)    Start     Dose/Rate Route Frequency Ordered Stop   05/11/22 2200  oseltamivir (TAMIFLU) capsule 30 mg        30 mg Oral 2 times daily 05/11/22 2036 05/16/22 2159       Subjective: Patient was seen and examined at bedside.  Overnight events noted.   Patient reports doing better. She still has significant pedal swelling.  She reports doing better overall  Objective: Vitals:   05/12/22 0441 05/12/22 0816 05/12/22 0858 05/12/22 1239  BP:   129/89 119/81  Pulse:   90 90  Resp:   20 16  Temp:   97.8 F (36.6 C) (!) 97.5 F (36.4 C)  TempSrc:   Oral Oral  SpO2:  95% 99% 98%  Weight: 75.4 kg     Height:        Intake/Output Summary (Last 24 hours) at 05/12/2022 1414 Last data filed at 05/12/2022 1234 Gross per 24 hour  Intake 720 ml  Output 1950 ml  Net -1230 ml   Filed Weights   05/11/22 1954 05/12/22 0441  Weight: 76 kg 75.4 kg    Examination:  General exam: Appears comfortable, not in any acute distress, deconditioned. Respiratory system: Clear  to auscultation. Respiratory effort normal. RR 13. Cardiovascular system: S1 & S2 heard, regular rate and rhythm, no murmur. Gastrointestinal system: Abdomen is soft, non tender, non distended, BS+ Central nervous system: Alert and oriented X 3. No focal neurological deficits. Extremities: Edema+, no cyanosis, no clubbing Skin: No rashes, lesions or ulcers Psychiatry: Judgement and insight appear normal. Mood & affect appropriate.     Data Reviewed: I  have personally reviewed following labs and imaging studies  CBC: Recent Labs  Lab 05/11/22 1505 05/12/22 0337  WBC 7.0 7.8  NEUTROABS 5.7  --   HGB 12.3 11.0*  HCT 40.4 36.7  MCV 90.0 88.9  PLT 229 314   Basic Metabolic Panel: Recent Labs  Lab 05/11/22 1505 05/12/22 0337  NA 140 140  K 4.6 4.7  CL 99 97*  CO2 31 31  GLUCOSE 107* 90  BUN 52* 56*  CREATININE 1.40* 1.56*  CALCIUM 8.7* 8.7*   GFR: Estimated Creatinine Clearance: 35.3 mL/min (A) (by C-G formula based on SCr of 1.56 mg/dL (H)). Liver Function Tests: Recent Labs  Lab 05/11/22 1505 05/12/22 0337  AST 30 24  ALT 35 27  ALKPHOS 75 62  BILITOT 1.4* 1.4*  PROT 7.4 6.3*  ALBUMIN 3.5 3.0*   No results for input(s): "LIPASE", "AMYLASE" in the last 168 hours. No results for input(s): "AMMONIA" in the last 168 hours. Coagulation Profile: No results for input(s): "INR", "PROTIME" in the last 168 hours. Cardiac Enzymes: No results for input(s): "CKTOTAL", "CKMB", "CKMBINDEX", "TROPONINI" in the last 168 hours. BNP (last 3 results) No results for input(s): "PROBNP" in the last 8760 hours. HbA1C: No results for input(s): "HGBA1C" in the last 72 hours. CBG: No results for input(s): "GLUCAP" in the last 168 hours. Lipid Profile: No results for input(s): "CHOL", "HDL", "LDLCALC", "TRIG", "CHOLHDL", "LDLDIRECT" in the last 72 hours. Thyroid Function Tests: No results for input(s): "TSH", "T4TOTAL", "FREET4", "T3FREE", "THYROIDAB" in the last 72 hours. Anemia Panel: No results for input(s): "VITAMINB12", "FOLATE", "FERRITIN", "TIBC", "IRON", "RETICCTPCT" in the last 72 hours. Sepsis Labs: No results for input(s): "PROCALCITON", "LATICACIDVEN" in the last 168 hours.  Recent Results (from the past 240 hour(s))  Resp panel by RT-PCR (RSV, Flu A&B, Covid) Anterior Nasal Swab     Status: Abnormal   Collection Time: 05/11/22  4:08 PM   Specimen: Anterior Nasal Swab  Result Value Ref Range Status   SARS  Coronavirus 2 by RT PCR NEGATIVE NEGATIVE Final    Comment: (NOTE) SARS-CoV-2 target nucleic acids are NOT DETECTED.  The SARS-CoV-2 RNA is generally detectable in upper respiratory specimens during the acute phase of infection. The lowest concentration of SARS-CoV-2 viral copies this assay can detect is 138 copies/mL. A negative result does not preclude SARS-Cov-2 infection and should not be used as the sole basis for treatment or other patient management decisions. A negative result may occur with  improper specimen collection/handling, submission of specimen other than nasopharyngeal swab, presence of viral mutation(s) within the areas targeted by this assay, and inadequate number of viral copies(<138 copies/mL). A negative result must be combined with clinical observations, patient history, and epidemiological information. The expected result is Negative.  Fact Sheet for Patients:  EntrepreneurPulse.com.au  Fact Sheet for Healthcare Providers:  IncredibleEmployment.be  This test is no t yet approved or cleared by the [place] FDA and  has been authorized for detection and/or diagnosis of SARS-CoV-2 by FDA under an Emergency Use Authorization (EUA). This EUA will remain  in effect (  meaning this test can be used) for the duration of the COVID-19 declaration under Section 564(b)(1) of the Act, 21 U.S.C.section 360bbb-3(b)(1), unless the authorization is terminated  or revoked sooner.       Influenza A by PCR POSITIVE (A) NEGATIVE Final   Influenza B by PCR NEGATIVE NEGATIVE Final    Comment: (NOTE) The Xpert Xpress SARS-CoV-2/FLU/RSV plus assay is intended as an aid in the diagnosis of influenza from Nasopharyngeal swab specimens and should not be used as a sole basis for treatment. Nasal washings and aspirates are unacceptable for Xpert Xpress SARS-CoV-2/FLU/RSV testing.  Fact Sheet for  Patients: EntrepreneurPulse.com.au  Fact Sheet for Healthcare Providers: IncredibleEmployment.be  This test is not yet approved or cleared by the [place] FDA and has been authorized for detection and/or diagnosis of SARS-CoV-2 by FDA under an Emergency Use Authorization (EUA). This EUA will remain in effect (meaning this test can be used) for the duration of the COVID-19 declaration under Section 564(b)(1) of the Act, 21 U.S.C. section 360bbb-3(b)(1), unless the authorization is terminated or revoked.     Resp Syncytial Virus by PCR NEGATIVE NEGATIVE Final    Comment: (NOTE) Fact Sheet for Patients: EntrepreneurPulse.com.au  Fact Sheet for Healthcare Providers: IncredibleEmployment.be  This test is not yet approved or cleared by the [place] FDA and has been authorized for detection and/or diagnosis of SARS-CoV-2 by FDA under an Emergency Use Authorization (EUA). This EUA will remain in effect (meaning this test can be used) for the duration of the COVID-19 declaration under Section 564(b)(1) of the Act, 21 U.S.C. section 360bbb-3(b)(1), unless the authorization is terminated or revoked.  Performed at [facility], [place] [address], [place], [place] [address]     Radiology Studies: DG Chest Port 1 View  Result Date: 05/11/2022 CLINICAL DATA:  Shortness of breath worsening over the last 3 days, extensive pulmonary embolus diagnosed on 04/29/2022 (treated with Eliquis, although reportedly the patient is not taking the medication for 3 days). EXAM: PORTABLE CHEST 1 VIEW COMPARISON:  Chest CT 04/29/2022 FINDINGS: Blunted right costophrenic angle compatible with small to moderate right pleural effusion. There is some subtle blunting of the left lateral costophrenic angle suggesting trace left pleural effusion. The patient is rotated to the right on today's radiograph, reducing diagnostic  sensitivity and specificity. Reverse lordotic projection. Atherosclerotic calcification of the aortic arch. Mild enlargement of the cardiopericardial silhouette. Markedly striking mitral valve calcification. No new airspace opacity is identified. IMPRESSION: 1. Small to moderate right pleural effusion. 2. Trace left pleural effusion. 3. Mild enlargement of the cardiopericardial silhouette. 4. Markedly striking mitral valve calcification. 5. Atherosclerotic calcification of the aortic arch. 6. Reverse lordotic projection. Electronically Signed   By: Van Clines M.D.   On: 05/11/2022 15:00    Scheduled Meds:  apixaban  5 mg Oral BID   bisoprolol  5 mg Oral Daily   ezetimibe  10 mg Oral Daily   furosemide  40 mg Intravenous Q12H   ipratropium-albuterol  3 mL Nebulization TID   levothyroxine  50 mcg Oral Q0600   oseltamivir  30 mg Oral BID   Continuous Infusions:   LOS: 1 day    Time spent: 50 mins    [place], MD [facility]   If 7PM-7AM, please contact night-coverage

## 2022-05-12 NOTE — Progress Notes (Signed)
PT demonstrated hands on understanding of Flutter device. PC at this time (small to slightly moderate, thick, clear).

## 2022-05-12 NOTE — Progress Notes (Signed)
Mobility Specialist - Progress Note   05/12/22 1340  Mobility  Activity Ambulated with assistance in hallway  Level of Assistance Standby assist, set-up cues, supervision of patient - no hands on  Assistive Device Front wheel walker  Distance Ambulated (ft) 60 ft  Range of Motion/Exercises Active  Activity Response Tolerated well  Mobility Referral Yes  $Mobility charge 1 Mobility   Pt was found in bed and agreeable to ambulate. Was SB throughout session and became SOB with session. RN followed with recliner chair and assisted pt back to room after ambulating ~57f due to SOB. At EOS was left on recliner chair with necessities in reach and chair alarm on.  BFerd HibbsMobility Specialist

## 2022-05-12 NOTE — Progress Notes (Signed)
  Echocardiogram 2D Echocardiogram has been performed.  Darlina Sicilian M 05/12/2022, 3:08 PM

## 2022-05-13 DIAGNOSIS — I5023 Acute on chronic systolic (congestive) heart failure: Secondary | ICD-10-CM | POA: Diagnosis not present

## 2022-05-13 DIAGNOSIS — I5020 Unspecified systolic (congestive) heart failure: Secondary | ICD-10-CM | POA: Diagnosis not present

## 2022-05-13 MED ORDER — LIDOCAINE 5 % EX PTCH
1.0000 | MEDICATED_PATCH | CUTANEOUS | Status: AC
  Administered 2022-05-13: 1 via TRANSDERMAL
  Filled 2022-05-13: qty 1

## 2022-05-13 MED ORDER — BISACODYL 10 MG RE SUPP
10.0000 mg | Freq: Once | RECTAL | Status: AC
  Administered 2022-05-14: 10 mg via RECTAL
  Filled 2022-05-13: qty 1

## 2022-05-13 MED ORDER — METHOCARBAMOL 500 MG PO TABS
500.0000 mg | ORAL_TABLET | Freq: Once | ORAL | Status: DC
  Filled 2022-05-13: qty 1

## 2022-05-13 MED ORDER — TRAMADOL HCL 50 MG PO TABS
50.0000 mg | ORAL_TABLET | Freq: Four times a day (QID) | ORAL | Status: DC | PRN
  Administered 2022-05-13 – 2022-05-16 (×5): 50 mg via ORAL
  Filled 2022-05-13 (×5): qty 1

## 2022-05-13 NOTE — Consult Note (Signed)
Cardiology Consultation   Patient ID: Cynthia Cook MRN: [id number]; DOB: [birth year]  Admit date: 05/11/2022 Date of Consult: 05/13/2022  PCP:  Lurline Del, [place] [facility] Cardiologist:  Venida Jarvis   Patient Profile:   Cynthia Cook is a 67 y.o. female with a hx of HFrEF, and atrial fibrillaton  who is being seen 05/13/2022 for the evaluation of  CHF  at the request of Dr Dwyane Dee.  History of Present Illness:   Cynthia Cook  is a 67 yo who has a hx of PAF, HFrEF (LVEF 30 to 35%), PAD, PE, CVA, subdural hematoma (Sept 2023), CKD IIIb  Recently was recently admitted with SOB, afib with RVR and large PE   She was discharged home from rehab on 1/26/124   The pt presented to ER on 05/11/22 with increasing SOB, LE edema, abdominal distension.   The patient says since D/C she has been weak   Has been more SOB, noted more swelling     She denies CP   No syncope       Past Medical History:  Diagnosis Date   Back pain    Gout    Murmur    PE (pulmonary embolism)    Persistent atrial fibrillation (HCC)    Pulmonary emboli ([place]) 02/05/2020   Syncope    Thyroid disease     History reviewed. No pertinent surgical history.     Inpatient Medications: Scheduled Meds:  apixaban  5 mg Oral BID   bisoprolol  5 mg Oral Daily   ezetimibe  10 mg Oral Daily   furosemide  40 mg Intravenous Q12H   ipratropium-albuterol  3 mL Nebulization TID   levothyroxine  50 mcg Oral Q0600   lidocaine  1 patch Transdermal Q24H   methocarbamol  500 mg Oral Once   oseltamivir  30 mg Oral BID   Continuous Infusions:  PRN Meds: acetaminophen **OR** acetaminophen, albuterol, ondansetron **OR** ondansetron (ZOFRAN) IV  Allergies:    Allergies  Allergen Reactions   Codeine Nausea Only   Duragesic-100 [Fentanyl] Nausea And Vomiting    Dizziness Lightheadedness    Mucinex [Guaifenesin Er] Other (See Comments)    Syncope    Nsaids Other (See Comments)    Kidney disease     Social History:   Social History   Socioeconomic History   Marital status: Divorced    Spouse name: Not on file   Number of children: 0   Years of education: Not on file   Highest education level: Bachelor's degree (e.g., BA, AB, BS)  Occupational History    Comment: retired Therapist, sports  Tobacco Use   Smoking status: Never   Smokeless tobacco: Never  Vaping Use   Vaping Use: Never used  Substance and Sexual Activity   Alcohol use: No   Drug use: Never   Sexual activity: Not on file  Other Topics Concern   Not on file  Social History Narrative   Lives with her dog, she has no family members. 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   Social Determinants of Health   Financial Resource Strain: Low Risk  (05/04/2022)   Overall Financial Resource Strain (CARDIA)    Difficulty of Paying Living Expenses: Not very hard  Food Insecurity: No Food Insecurity (05/11/2022)   Hunger Vital Sign    Worried About Running Out of Food in the Last Year: Never true    Ran Out of Food in the Last Year:  Never true  Transportation Needs: No Transportation Needs (05/11/2022)   PRAPARE - Hydrologist (Medical): No    Lack of Transportation (Non-Medical): No  Physical Activity: Not on file  Stress: Stress Concern Present (06/14/2021)   [place]    Feeling of Stress : Very much  Social Connections: Not on file  Intimate Partner Violence: Not At Risk (05/11/2022)   Humiliation, Afraid, Cook, and Kick questionnaire    Fear of Current or Ex-Partner: No    Emotionally Abused: No    Physically Abused: No    Sexually Abused: No    Family History:   NOncontributory for above  Family History  Family history unknown: Yes     ROS:  Please see the history of present illness.   All other ROS reviewed and negative.     Physical Exam/Data:   Vitals:   05/12/22 1732 05/12/22 2050 05/13/22 0500  05/13/22 0544  BP: 120/85 119/74  121/80  Pulse: 63 81  79  Resp: '20 18  20  '$ Temp:  98.9 F (37.2 C)  (!) 97.5 F (36.4 C)  TempSrc:  Oral  Oral  SpO2: 99% 100%  98%  Weight:   74.9 kg   Height:        Intake/Output Summary (Last 24 hours) at 05/13/2022 0937 Last data filed at 05/12/2022 2256 Gross per 24 hour  Intake 240 ml  Output 1400 ml  Net -1160 ml      05/13/2022    5:00 AM 05/12/2022    4:41 AM 05/11/2022    7:54 PM  Last 3 Weights  Weight (lbs) 165 lb 3.2 oz 166 lb 4.8 oz 167 lb 8.8 oz  Weight (kg) 74.934 kg 75.433 kg 76 kg     Body mass index is 26.66 kg/m.  General:  Pt is 67 yo appearing older than stated age, in  no acute distress  Pt sitting in chair HEENT: normal Neck: JVP is increased Vascular: No carotid bruits; Distal pulses 2+ bilaterally Cardiac:  Irreg irreg    S1, S2; RRR; no murmurw Lungs:  Bilateral rhonchi  Mild rales  Abd: soft, nontender, no hepatomegaly  Ext: Tr LE  edema Musculoskeletal:  No deformities, BUE and BLE strength normal and equal Skin: warm and dry  Neuro:  CNs 2-12 intact, no focal abnormalities noted Psych:  Normal affect   EKG:  The EKG was personally reviewed and demonstrates:  Afib  Nonspecific ST changes  Telemetry:  Telemetry was personally reviewed and demonstrates:  Afib  80s to 100s   Relevant CV Studies: 05/12/22  Echo     1. Left ventricular ejection fraction, by estimation, is 35 to 40%. The  left ventricle has moderately decreased function. The left ventricle  demonstrates global hypokinesis. There is moderate concentric left  ventricular hypertrophy. Left ventricular  diastolic parameters are indeterminate.   2. Right ventricular systolic function was not well visualized. The right  ventricular size is not well visualized.   3. Left atrial size was severely dilated.   4. MVA by deceleration time not likely accurate mean gradient 6 peak 11  mmHg at HR of 102 bpm . The mitral valve is abnormal. Moderate mitral   valve regurgitation. Mild to moderate mitral stenosis. The mean mitral  valve gradient is 5.7 mmHg. Moderate  mitral annular calcification.   5. Mean gradient 10 peak 18 mmHg on TTE done 05/03/22 Poor CW  interrogation  likely moderate low flow AS . The aortic valve is tricuspid.  There is moderate calcification of the aortic valve. Aortic valve  regurgitation is mild. Mild aortic valve stenosis.   6. Aortic dilatation noted and Normal Ascending Aorta. There is mild  dilatation of the ascending aorta, measuring 41 mm.    Echo 04/30/22     1. Left ventricular ejection fraction, by estimation, is 30 to 35%. The  left ventricle has moderately decreased function. The left ventricle  demonstrates global hypokinesis. There is moderate left ventricular  hypertrophy. Left ventricular diastolic  parameters are indeterminate.   2. Right ventricular systolic function is moderately reduced. The right  ventricular size is mildly enlarged. Tricuspid regurgitation signal is  inadequate for assessing PA pressure.   3. Left atrial size was severely dilated.   4. Right atrial size was severely dilated.   5. The mitral valve is degenerative. Mild to moderate mitral valve  regurgitation. Moderate mitral stenosis. The mean mitral valve gradient is  8.0 mmHg with average heart rate of 102 bpm. Severe mitral annular  calcification. MVA 1.5 cm^2 by continuity  equation   6. The aortic valve is calcified. There is moderate calcification of the  aortic valve. Aortic valve regurgitation is trivial. Moderate aortic valve  stenosis. Mild AS by gradients (Vmax 2.1 m/s, MG 30mHg), moderate by AVA  (1.2 cm^2) and DI (0.4).  Suspect low flow low gradient moderate AS   7. The inferior vena cava is dilated in size with <50% respiratory  variability, suggesting right atrial pressure of 15 mmHg.   Echo   Sept 2023     1. Left ventricular ejection fraction, by estimation, is 55 to 60%. The  left ventricle has  normal function. The left ventricle has no regional  wall motion abnormalities. There is mild left ventricular hypertrophy.  Left ventricular diastolic parameters  are indeterminate.   2. Right ventricular systolic function is normal. The right ventricular  size is normal. There is normal pulmonary artery systolic pressure.   3. Left atrial size was moderately dilated.   4. Right atrial size was moderately dilated.   5. Severe bulky posterior annular calcification mean diastolic gradient 5  mmHg but MVA estimated at > 2 cm2 . The mitral valve is degenerative.  Moderate mitral valve regurgitation. No evidence of mitral stenosis.  Severe mitral annular calcification.   6. The aortic valve is tricuspid. There is moderate calcification of the  aortic valve. There is moderate thickening of the aortic valve. Aortic  valve regurgitation is mild. Mild aortic valve stenosis.   7. Aortic dilatation noted. There is moderate dilatation of the ascending  aorta, measuring 42 mm.   8. The inferior vena cava is dilated in size with <50% respiratory  variability, suggesting right atrial pressure of 15 mmHg.    Laboratory Data:  High Sensitivity Troponin:   Recent Labs  Lab 04/29/22 0639 04/29/22 0849 05/11/22 1505 05/11/22 1638  TROPONINIHS 24* 24* 24* 25*     Chemistry Recent Labs  Lab 05/11/22 1505 05/12/22 0337  NA 140 140  K 4.6 4.7  CL 99 97*  CO2 31 31  GLUCOSE 107* 90  BUN 52* 56*  CREATININE 1.40* 1.56*  CALCIUM 8.7* 8.7*  GFRNONAA 41* 36*  ANIONGAP 10 12    Recent Labs  Lab 05/11/22 1505 05/12/22 0337  PROT 7.4 6.3*  ALBUMIN 3.5 3.0*  AST 30 24  ALT 35 27  ALKPHOS 75 62  BILITOT 1.4* 1.4*  Lipids No results for input(s): "CHOL", "TRIG", "HDL", "LABVLDL", "LDLCALC", "CHOLHDL" in the last 168 hours.  Hematology Recent Labs  Lab 05/11/22 1505 05/12/22 0337  WBC 7.0 7.8  RBC 4.49 4.13  HGB 12.3 11.0*  HCT 40.4 36.7  MCV 90.0 88.9  MCH 27.4 26.6  MCHC 30.4  30.0  RDW 15.5 15.6*  PLT 229 205   Thyroid No results for input(s): "TSH", "FREET4" in the last 168 hours.  BNP Recent Labs  Lab 05/11/22 1506  BNP 790.4*    DDimer No results for input(s): "DDIMER" in the last 168 hours.   Radiology/Studies:  ECHOCARDIOGRAM COMPLETE  Result Date: 05/12/2022    ECHOCARDIOGRAM REPORT   Patient Name:   Cynthia Cook Date of Exam: 05/12/2022 Medical Rec #:  [id number]      Height:       66.0 in Accession #:    [id number]     Weight:       166.3 lb Date of Birth:  09-09-1955       BSA:          1.849 m Patient Age:    33 years       BP:           119/81 mmHg Patient Gender: F              HR:           104 bpm. Exam Location:  Inpatient Procedure: 2D Echo, Cardiac Doppler and Color Doppler Indications:    CHF-Acute Systolic D17.61  History:        Patient has prior history of Echocardiogram examinations, most                 recent 04/30/2022. Aortic Valve Disease and Mitral Valve Disease,                 Arrythmias:Atrial Fibrillation; Signs/Symptoms:Murmur. Thyroid                 disease. Pulmonary Embolism.  Sonographer:    Darlina Sicilian RDCS Referring Phys: [id number] [facility]  Sonographer Comments: Suboptimal subcostal window. Limited imaging due to patient positioning. IMPRESSIONS  1. Left ventricular ejection fraction, by estimation, is 35 to 40%. The left ventricle has moderately decreased function. The left ventricle demonstrates global hypokinesis. There is moderate concentric left ventricular hypertrophy. Left ventricular diastolic parameters are indeterminate.  2. Right ventricular systolic function was not well visualized. The right ventricular size is not well visualized.  3. Left atrial size was severely dilated.  4. MVA by deceleration time not likely accurate mean gradient 6 peak 11 mmHg at HR of 102 bpm . The mitral valve is abnormal. Moderate mitral valve regurgitation. Mild to moderate mitral stenosis. The mean mitral valve gradient is 5.7 mmHg.  Moderate mitral annular calcification.  5. Mean gradient 10 peak 18 mmHg on TTE done 05/03/22 Poor CW interrogation likely moderate low flow AS . The aortic valve is tricuspid. There is moderate calcification of the aortic valve. Aortic valve regurgitation is mild. Mild aortic valve stenosis.  6. Aortic dilatation noted and Normal Ascending Aorta. There is mild dilatation of the ascending aorta, measuring 41 mm. FINDINGS  Left Ventricle: Left ventricular ejection fraction, by estimation, is 35 to 40%. The left ventricle has moderately decreased function. The left ventricle demonstrates global hypokinesis. The left ventricular internal cavity size was normal in size. There is moderate concentric left ventricular hypertrophy. Left ventricular diastolic parameters are indeterminate. Right Ventricle: The  right ventricular size is not well visualized. Right vetricular wall thickness was not assessed. Right ventricular systolic function was not well visualized. Left Atrium: Left atrial size was severely dilated. Right Atrium: Right atrial size was not well visualized. Pericardium: There is no evidence of pericardial effusion. Mitral Valve: MVA by deceleration time not likely accurate mean gradient 6 peak 11 mmHg at HR of 102 bpm. The mitral valve is abnormal. There is moderate thickening of the mitral valve leaflet(s). There is moderate calcification of the mitral valve leaflet(s). Moderate mitral annular calcification. Moderate mitral valve regurgitation. Mild to moderate mitral valve stenosis. MV peak gradient, 10.8 mmHg. The mean mitral valve gradient is 5.7 mmHg. Tricuspid Valve: The tricuspid valve is not well visualized. Tricuspid valve regurgitation is mild . No evidence of tricuspid stenosis. Aortic Valve: Mean gradient 10 peak 18 mmHg on TTE done 05/03/22 Poor CW interrogation likely moderate low flow AS. The aortic valve is tricuspid. There is moderate calcification of the aortic valve. Aortic valve regurgitation  is mild. Mild aortic stenosis is present. Aortic valve mean gradient measures 6.0 mmHg. Aortic valve peak gradient measures 9.7 mmHg. Aortic valve area, by VTI measures 1.18 cm. Pulmonic Valve: The pulmonic valve was grossly normal. Pulmonic valve regurgitation is trivial. No evidence of pulmonic stenosis. Aorta: Aortic dilatation noted and Normal Ascending Aorta. There is mild dilatation of the ascending aorta, measuring 41 mm. IAS/Shunts: No atrial level shunt detected by color flow Doppler.  LEFT VENTRICLE PLAX 2D LVIDd:         4.46 cm   Diastology LVIDs:         3.61 cm   LV e' medial:    3.96 cm/s LV PW:         1.52 cm   LV E/e' medial:  37.9 LV IVS:        1.71 cm   LV e' lateral:   4.73 cm/s LVOT diam:     1.90 cm   LV E/e' lateral: 31.7 LV SV:         29 LV SV Index:   16 LVOT Area:     2.84 cm  LEFT ATRIUM           Index LA Vol (A4C): 92.7 ml 50.14 ml/m  AORTIC VALVE AV Area (Vmax):    1.20 cm AV Area (Vmean):   1.13 cm AV Area (VTI):     1.18 cm AV Vmax:           155.50 cm/s AV Vmean:          115.050 cm/s AV VTI:            0.249 m AV Peak Grad:      9.7 mmHg AV Mean Grad:      6.0 mmHg LVOT Vmax:         65.55 cm/s LVOT Vmean:        45.850 cm/s LVOT VTI:          0.104 m LVOT/AV VTI ratio: 0.42  AORTA Ao Root diam: 3.50 cm Ao Asc diam:  4.10 cm MITRAL VALVE MV Area (PHT): 8.11 cm     SHUNTS MV Area VTI:   1.09 cm     Systemic VTI:  0.10 m MV Peak grad:  10.8 mmHg    Systemic Diam: 1.90 cm MV Mean grad:  5.7 mmHg MV Vmax:       1.65 m/s MV Vmean:      110.7 cm/s MV Decel Time:  94 msec MV E velocity: 150.00 cm/s Jenkins Rouge MD Electronically signed by Jenkins Rouge MD Signature Date/Time: 05/12/2022/3:22:51 PM    Final    DG Chest Port 1 View  Result Date: 05/11/2022 CLINICAL DATA:  Shortness of breath worsening over the last 3 days, extensive pulmonary embolus diagnosed on 04/29/2022 (treated with Eliquis, although reportedly the patient is not taking the medication for 3 days). EXAM:  PORTABLE CHEST 1 VIEW COMPARISON:  Chest CT 04/29/2022 FINDINGS: Blunted right costophrenic angle compatible with small to moderate right pleural effusion. There is some subtle blunting of the left lateral costophrenic angle suggesting trace left pleural effusion. The patient is rotated to the right on today's radiograph, reducing diagnostic sensitivity and specificity. Reverse lordotic projection. Atherosclerotic calcification of the aortic arch. Mild enlargement of the cardiopericardial silhouette. Markedly striking mitral valve calcification. No new airspace opacity is identified. IMPRESSION: 1. Small to moderate right pleural effusion. 2. Trace left pleural effusion. 3. Mild enlargement of the cardiopericardial silhouette. 4. Markedly striking mitral valve calcification. 5. Atherosclerotic calcification of the aortic arch. 6. Reverse lordotic projection. Electronically Signed   By: Van Clines M.D.   On: 05/11/2022 15:00     Assessment and Plan:   Hx of HFrEF  Patient with normal LVEF in Sept 2023   PResented in Jan 2024 with volume overload and afib with RVR   LVEF  30 to 35%  Admitted with volume overload    -patient currently diuresing with IV lasix     2.5 L negative   Pt was seen by cardiology on recent admit   May be tachy induced.  The pt did not want invasive evaluation, would not want any intervention.   She was sent home on Z[place] WIll follow here   Most likely should go home with diuretic with close outpt follow up   Follow BP   May be able to add to this    2  Elevated troponin  Triival   Flat   20s   Follow  Pt denies any CP    Patient with coronary calcifications   No CP     3  Atrial fibrillation  Pt had a hx of PAF for years She had been on anticoagulation in past but it was stopped for different recent [facility] was restarted on anticoagulation when admitted in Jan  2024   CHADSVASC score is 7   Currently patient remains in Afib    Rates are OK overall   Follow   Continue  Eliquis  4  Atherosclerosis  Xrays show aortic atherosclerosis    5  HL   Pt declined statin in the past.  Has been on Zetia   6  PE  Hx recurrent PE in past, often atter travel  Had not been on chronic anticoagulation.   Was  dx with submassive PE on 04/16/22    Plan for indefinite Eliquis   7.    Influenza A  Pt is on Tamiflu  8 Hx of anemia   Hgb 11 yesterday   9  Hx of CVA, subdural hematomas   Follow closely on Eliquis  10  CKD     Cr 1.56   Baseline  11 Debility  Hx of falls   May need additional rehab.          :[id number]}         For questions or updates, please contact [place] Please consult [email or web address] for contact info under  Signed, Dorris Carnes, MD  05/13/2022 9:37 AM

## 2022-05-13 NOTE — Progress Notes (Signed)
PROGRESS NOTE    Cynthia Cook  BMW:[id number] DOB: April 29, 1955 DOA: 05/11/2022 PCP: Lurline Del, DO   Brief Narrative:  This 67 years old female with PMH significant for paroxysmal A-fib, prior PE s/p treatment not on anticoagulation, history of CVA, subdural hematoma, hypothyroidism, chronic back pain due to fracture of lumbar vertebrae, HFrEF 30 to 35%, PVD, gout, CKD stage IIIb with recent interim history of admission 04/16/2022 to 04/29/2022 with diagnosis of acute PE s/p which patient was discharged to rehab.  Patient now returned to the ED 3 days after discharge from rehab with complaints of increasing shortness of breath, lower extremity edema, abdominal distention for 1 week.  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  She believes this is the cause of her current symptoms.  She feels better status post lasix  given in the ED.  She is admitted for further evaluation.  Echocardiogram is pending.  Respiratory panel influenza A positive.  Assessment & Plan:   Principal Problem:   CHF exacerbation ([place])  Acute exacerbation of HFr EF: Last Echo showed LVEF 30-35% Continue Lasix 40 mg IV twice daily Daily weight, daily intake output charting Echo this admission LVEF 35-40% Cardiology is consulted,  awaiting recommendation.   Intake/Output Summary (Last 24 hours) at 05/13/2022 1411 Last data filed at 05/13/2022 1019 Gross per 24 hour  Intake 240 ml  Output 1200 ml  Net -960 ml     Influenza A+ Patient presented with worsening symptoms in the last 72 hours. Continue Tamiflu for 5 days. Continue supportive care. Patient without fever, URI symptoms.  Paroxysmal atrial fibrillation: Heart rate well-controlled.   Continue Zebeta and Eliquis.   History of PE: Diagnosed on 04/16/22 Continue Eliquis. She remains on room air.  Elevated troponin: Appears chronically weak, Trop 24 Patient denies any chest pain, EKG  without acute changes.  Normocytic anemia: Hb stable. Continue to monitor.  CKD IIIb: Serum creatinine at baseline. Avoid nephrotoxic medications.   Hx of CVA and subdural hematomas No active issues.  Stable on Eliquis.   Hypothyroidism; Continue synthroid.   DVT prophylaxis: Eliquis Code Status: Full code. Family Communication: No family at bed side. Disposition Plan:   Status is: Inpatient Remains inpatient appropriate because: Admitted for acute systolic CHF requiring IV Lasix.  Echocardiogram shows improving LVEF.  Cardiology is consulted.    Consultants:  Cardiology  Procedures: Echo  Antimicrobials:  Anti-infectives (From admission, onward)    Start     Dose/Rate Route Frequency Ordered Stop   05/11/22 2200  oseltamivir (TAMIFLU) capsule 30 mg        30 mg Oral 2 times daily 05/11/22 2036 05/16/22 2159       Subjective: Patient was seen and examined at bedside.  Overnight events noted.   Patient reports doing better, still reports having significant pedal edema and shortness of breath on exertion.  Objective: Vitals:   05/12/22 2050 05/13/22 0500 05/13/22 0544 05/13/22 1151  BP: 119/74  121/80 109/84  Pulse: 81  79 72  Resp: 18  20 (!) 25  Temp: 98.9 F (37.2 C)  (!) 97.5 F (36.4 C) 98.7 F (37.1 C)  TempSrc: Oral  Oral Oral  SpO2: 100%  98% 100%  Weight:  74.9 kg    Height:        Intake/Output Summary (Last 24 hours) at 05/13/2022 1411 Last data filed at 05/13/2022 1019 Gross per 24 hour  Intake 240  ml  Output 1200 ml  Net -960 ml   Filed Weights   05/11/22 1954 05/12/22 0441 05/13/22 0500  Weight: 76 kg 75.4 kg 74.9 kg    Examination:  General exam: Appears comfortable, not in any acute distress, deconditioned. Respiratory system: Bibasilar crackles, respiratory effort normal, no accessory muscle use, RR 15 Cardiovascular system: S1 & S2 heard, regular rate and rhythm, no murmur. Gastrointestinal system: Abdomen is soft, non tender,  non distended, BS+ Central nervous system: Alert and oriented X 3. No focal neurological deficits. Extremities: Edema++, no cyanosis, no clubbing Skin: No rashes, lesions or ulcers Psychiatry: Judgement and insight appear normal. Mood & affect appropriate.     Data Reviewed: I have personally reviewed following labs and imaging studies  CBC: Recent Labs  Lab 05/11/22 1505 05/12/22 0337  WBC 7.0 7.8  NEUTROABS 5.7  --   HGB 12.3 11.0*  HCT 40.4 36.7  MCV 90.0 88.9  PLT 229 263   Basic Metabolic Panel: Recent Labs  Lab 05/11/22 1505 05/12/22 0337  NA 140 140  K 4.6 4.7  CL 99 97*  CO2 31 31  GLUCOSE 107* 90  BUN 52* 56*  CREATININE 1.40* 1.56*  CALCIUM 8.7* 8.7*   GFR: Estimated Creatinine Clearance: 35.2 mL/min (A) (by C-G formula based on SCr of 1.56 mg/dL (H)). Liver Function Tests: Recent Labs  Lab 05/11/22 1505 05/12/22 0337  AST 30 24  ALT 35 27  ALKPHOS 75 62  BILITOT 1.4* 1.4*  PROT 7.4 6.3*  ALBUMIN 3.5 3.0*   No results for input(s): "LIPASE", "AMYLASE" in the last 168 hours. No results for input(s): "AMMONIA" in the last 168 hours. Coagulation Profile: No results for input(s): "INR", "PROTIME" in the last 168 hours. Cardiac Enzymes: No results for input(s): "CKTOTAL", "CKMB", "CKMBINDEX", "TROPONINI" in the last 168 hours. BNP (last 3 results) No results for input(s): "PROBNP" in the last 8760 hours. HbA1C: No results for input(s): "HGBA1C" in the last 72 hours. CBG: No results for input(s): "GLUCAP" in the last 168 hours. Lipid Profile: No results for input(s): "CHOL", "HDL", "LDLCALC", "TRIG", "CHOLHDL", "LDLDIRECT" in the last 72 hours. Thyroid Function Tests: No results for input(s): "TSH", "T4TOTAL", "FREET4", "T3FREE", "THYROIDAB" in the last 72 hours. Anemia Panel: No results for input(s): "VITAMINB12", "FOLATE", "FERRITIN", "TIBC", "IRON", "RETICCTPCT" in the last 72 hours. Sepsis Labs: No results for input(s): "PROCALCITON",  "LATICACIDVEN" in the last 168 hours.  Recent Results (from the past 240 hour(s))  Resp panel by RT-PCR (RSV, Flu A&B, Covid) Anterior Nasal Swab     Status: Abnormal   Collection Time: 05/11/22  4:08 PM   Specimen: Anterior Nasal Swab  Result Value Ref Range Status   SARS Coronavirus 2 by RT PCR NEGATIVE NEGATIVE Final    Comment: (NOTE) SARS-CoV-2 target nucleic acids are NOT DETECTED.  The SARS-CoV-2 RNA is generally detectable in upper respiratory specimens during the acute phase of infection. The lowest concentration of SARS-CoV-2 viral copies this assay can detect is 138 copies/mL. A negative result does not preclude SARS-Cov-2 infection and should not be used as the sole basis for treatment or other patient management decisions. A negative result may occur with  improper specimen collection/handling, submission of specimen other than nasopharyngeal swab, presence of viral mutation(s) within the areas targeted by this assay, and inadequate number of viral copies(<138 copies/mL). A negative result must be combined with clinical observations, patient history, and epidemiological information. The expected result is Negative.  Fact Sheet for Patients:  EntrepreneurPulse.com.au  Fact Sheet for Healthcare Providers:  IncredibleEmployment.be  This test is no t yet approved or cleared by the [place] FDA and  has been authorized for detection and/or diagnosis of SARS-CoV-2 by FDA under an Emergency Use Authorization (EUA). This EUA will remain  in effect (meaning this test can be used) for the duration of the COVID-19 declaration under Section 564(b)(1) of the Act, 21 U.S.C.section 360bbb-3(b)(1), unless the authorization is terminated  or revoked sooner.       Influenza A by PCR POSITIVE (A) NEGATIVE Final   Influenza B by PCR NEGATIVE NEGATIVE Final    Comment: (NOTE) The Xpert Xpress SARS-CoV-2/FLU/RSV plus assay is intended as an  aid in the diagnosis of influenza from Nasopharyngeal swab specimens and should not be used as a sole basis for treatment. Nasal washings and aspirates are unacceptable for Xpert Xpress SARS-CoV-2/FLU/RSV testing.  Fact Sheet for Patients: EntrepreneurPulse.com.au  Fact Sheet for Healthcare Providers: IncredibleEmployment.be  This test is not yet approved or cleared by the [place] FDA and has been authorized for detection and/or diagnosis of SARS-CoV-2 by FDA under an Emergency Use Authorization (EUA). This EUA will remain in effect (meaning this test can be used) for the duration of the COVID-19 declaration under Section 564(b)(1) of the Act, 21 U.S.C. section 360bbb-3(b)(1), unless the authorization is terminated or revoked.     Resp Syncytial Virus by PCR NEGATIVE NEGATIVE Final    Comment: (NOTE) Fact Sheet for Patients: EntrepreneurPulse.com.au  Fact Sheet for Healthcare Providers: IncredibleEmployment.be  This test is not yet approved or cleared by the [place] FDA and has been authorized for detection and/or diagnosis of SARS-CoV-2 by FDA under an Emergency Use Authorization (EUA). This EUA will remain in effect (meaning this test can be used) for the duration of the COVID-19 declaration under Section 564(b)(1) of the Act, 21 U.S.C. section 360bbb-3(b)(1), unless the authorization is terminated or revoked.  Performed at [facility], [place] [address] [place], [place] [address]     Radiology Studies: ECHOCARDIOGRAM COMPLETE  Result Date: 05/12/2022    ECHOCARDIOGRAM REPORT   Patient Name:   Cynthia Cook Date of Exam: 05/12/2022 Medical Rec #:  [id number]      Height:       66.0 in Accession #:    [id number]     Weight:       166.3 lb Date of Birth:  March 28, 1956       BSA:          1.849 m Patient Age:    15 years       BP:           119/81 mmHg Patient Gender: F               HR:           104 bpm. Exam Location:  Inpatient Procedure: 2D Echo, Cardiac Doppler and Color Doppler Indications:    CHF-Acute Systolic O67.67  History:        Patient has prior history of Echocardiogram examinations, most                 recent 04/30/2022. Aortic Valve Disease and Mitral Valve Disease,                 Arrythmias:Atrial Fibrillation; Signs/Symptoms:Murmur. Thyroid                 disease. Pulmonary Embolism.  Sonographer:    Darlina Sicilian RDCS Referring Phys:  [id number] SARA-MAIZ A THOMAS  Sonographer Comments: Suboptimal subcostal window. Limited imaging due to patient positioning. IMPRESSIONS  1. Left ventricular ejection fraction, by estimation, is 35 to 40%. The left ventricle has moderately decreased function. The left ventricle demonstrates global hypokinesis. There is moderate concentric left ventricular hypertrophy. Left ventricular diastolic parameters are indeterminate.  2. Right ventricular systolic function was not well visualized. The right ventricular size is not well visualized.  3. Left atrial size was severely dilated.  4. MVA by deceleration time not likely accurate mean gradient 6 peak 11 mmHg at HR of 102 bpm . The mitral valve is abnormal. Moderate mitral valve regurgitation. Mild to moderate mitral stenosis. The mean mitral valve gradient is 5.7 mmHg. Moderate mitral annular calcification.  5. Mean gradient 10 peak 18 mmHg on TTE done 05/03/22 Poor CW interrogation likely moderate low flow AS . The aortic valve is tricuspid. There is moderate calcification of the aortic valve. Aortic valve regurgitation is mild. Mild aortic valve stenosis.  6. Aortic dilatation noted and Normal Ascending Aorta. There is mild dilatation of the ascending aorta, measuring 41 mm. FINDINGS  Left Ventricle: Left ventricular ejection fraction, by estimation, is 35 to 40%. The left ventricle has moderately decreased function. The left ventricle demonstrates global hypokinesis. The left ventricular  internal cavity size was normal in size. There is moderate concentric left ventricular hypertrophy. Left ventricular diastolic parameters are indeterminate. Right Ventricle: The right ventricular size is not well visualized. Right vetricular wall thickness was not assessed. Right ventricular systolic function was not well visualized. Left Atrium: Left atrial size was severely dilated. Right Atrium: Right atrial size was not well visualized. Pericardium: There is no evidence of pericardial effusion. Mitral Valve: MVA by deceleration time not likely accurate mean gradient 6 peak 11 mmHg at HR of 102 bpm. The mitral valve is abnormal. There is moderate thickening of the mitral valve leaflet(s). There is moderate calcification of the mitral valve leaflet(s). Moderate mitral annular calcification. Moderate mitral valve regurgitation. Mild to moderate mitral valve stenosis. MV peak gradient, 10.8 mmHg. The mean mitral valve gradient is 5.7 mmHg. Tricuspid Valve: The tricuspid valve is not well visualized. Tricuspid valve regurgitation is mild . No evidence of tricuspid stenosis. Aortic Valve: Mean gradient 10 peak 18 mmHg on TTE done 05/03/22 Poor CW interrogation likely moderate low flow AS. The aortic valve is tricuspid. There is moderate calcification of the aortic valve. Aortic valve regurgitation is mild. Mild aortic stenosis is present. Aortic valve mean gradient measures 6.0 mmHg. Aortic valve peak gradient measures 9.7 mmHg. Aortic valve area, by VTI measures 1.18 cm. Pulmonic Valve: The pulmonic valve was grossly normal. Pulmonic valve regurgitation is trivial. No evidence of pulmonic stenosis. Aorta: Aortic dilatation noted and Normal Ascending Aorta. There is mild dilatation of the ascending aorta, measuring 41 mm. IAS/Shunts: No atrial level shunt detected by color flow Doppler.  LEFT VENTRICLE PLAX 2D LVIDd:         4.46 cm   Diastology LVIDs:         3.61 cm   LV e' medial:    3.96 cm/s LV PW:         1.52  cm   LV E/e' medial:  37.9 LV IVS:        1.71 cm   LV e' lateral:   4.73 cm/s LVOT diam:     1.90 cm   LV E/e' lateral: 31.7 LV SV:         29 LV  SV Index:   16 LVOT Area:     2.84 cm  LEFT ATRIUM           Index LA Vol (A4C): 92.7 ml 50.14 ml/m  AORTIC VALVE AV Area (Vmax):    1.20 cm AV Area (Vmean):   1.13 cm AV Area (VTI):     1.18 cm AV Vmax:           155.50 cm/s AV Vmean:          115.050 cm/s AV VTI:            0.249 m AV Peak Grad:      9.7 mmHg AV Mean Grad:      6.0 mmHg LVOT Vmax:         65.55 cm/s LVOT Vmean:        45.850 cm/s LVOT VTI:          0.104 m LVOT/AV VTI ratio: 0.42  AORTA Ao Root diam: 3.50 cm Ao Asc diam:  4.10 cm MITRAL VALVE MV Area (PHT): 8.11 cm     SHUNTS MV Area VTI:   1.09 cm     Systemic VTI:  0.10 m MV Peak grad:  10.8 mmHg    Systemic Diam: 1.90 cm MV Mean grad:  5.7 mmHg MV Vmax:       1.65 m/s MV Vmean:      110.7 cm/s MV Decel Time: 94 msec MV E velocity: 150.00 cm/s Jenkins Rouge MD Electronically signed by Jenkins Rouge MD Signature Date/Time: 05/12/2022/3:22:51 PM    Final    DG Chest Port 1 View  Result Date: 05/11/2022 CLINICAL DATA:  Shortness of breath worsening over the last 3 days, extensive pulmonary embolus diagnosed on 04/29/2022 (treated with Eliquis, although reportedly the patient is not taking the medication for 3 days). EXAM: PORTABLE CHEST 1 VIEW COMPARISON:  Chest CT 04/29/2022 FINDINGS: Blunted right costophrenic angle compatible with small to moderate right pleural effusion. There is some subtle blunting of the left lateral costophrenic angle suggesting trace left pleural effusion. The patient is rotated to the right on today's radiograph, reducing diagnostic sensitivity and specificity. Reverse lordotic projection. Atherosclerotic calcification of the aortic arch. Mild enlargement of the cardiopericardial silhouette. Markedly striking mitral valve calcification. No new airspace opacity is identified. IMPRESSION: 1. Small to moderate right pleural  effusion. 2. Trace left pleural effusion. 3. Mild enlargement of the cardiopericardial silhouette. 4. Markedly striking mitral valve calcification. 5. Atherosclerotic calcification of the aortic arch. 6. Reverse lordotic projection. Electronically Signed   By: Van Clines M.D.   On: 05/11/2022 15:00    Scheduled Meds:  apixaban  5 mg Oral BID   bisoprolol  5 mg Oral Daily   ezetimibe  10 mg Oral Daily   furosemide  40 mg Intravenous Q12H   ipratropium-albuterol  3 mL Nebulization TID   levothyroxine  50 mcg Oral Q0600   lidocaine  1 patch Transdermal Q24H   methocarbamol  500 mg Oral Once   oseltamivir  30 mg Oral BID   Continuous Infusions:   LOS: 2 days    Time spent: 35 mins    Malory Spurr, MD [facility]   If 7PM-7AM, please contact night-coverage

## 2022-05-13 NOTE — Consult Note (Signed)
[facility] Nurse Consult Note: Reason for Consult:Patient requested to speak with [facility] nurse after the [facility] Nurse had left the [facility] on 05/12/22.  I called the patient in her room this morning with the assistance of the unit Charge RN, I. Oraegbunam.  Patient was seen on 05/04/22 at [facility] by my associate D. Barbie Haggis. The patient is requesting the POC previously initiated by Ms Barbie Haggis on that campus to be implemented at this campus. I assure her that I will provide the nursing staff with that guidance via the Orders and let her know that the staff were unaware of those interventions as all orders were discontinued upon her discharge. She expresses understanding and appreciation. Additionally, I inform her that I will provide her with a pressure redistribution chair cushion today for her use while OOB in the chair and also for her use at home and tell her that the chair cushion is to be sent home with her upon discharge. She expresses appreciation for that medical equipment. Wound type:Pressure  Pressure Injury POA: Yes Measurement:Bilateral buttocks with stage 2 pressure injuries, each 0.5cm round x 0.1cm. Wound ARW:PTYY, moist Drainage (amount, consistency, odor) scant serous Periwound:deeply pigmented and consistent with chronic tissue injury Dressing procedure/placement/frequency:I have provided Nursing with guidance for turning and repositioning patient to minimize time in the supine position. A pressure redistribution chair cushion is provided for in house and post discharge use while OOB ion a chair. Topical care will be for daily cleansing of the lesions followed by covering with a size appropriate piece of xeroform gauze Kellie Simmering # 294, antimicrobial, nonadherent) covered with dry gauze and secured with silicone foam.   [facility] nursing team will not follow, but will remain available to this patient, the nursing and medical teams.  Please re-consult if needed.  Thank you for inviting [place]  to participate in this patient's Plan of Care.  Maudie Flakes, MSN, RN, CNS, [place], Serita Grammes, [facility], [employer] phone:  [phone],

## 2022-05-13 NOTE — Progress Notes (Signed)
Patient is requesting a suppository and a daily stool softener, [facility] paged.

## 2022-05-13 NOTE — Progress Notes (Signed)
       CROSS COVER NOTE  NAME: Cynthia Cook MRN: [id number] DOB : December 08, 1955    HPI/Events of Note   Patient with chronic back pain unrelieved by tylenol  Assessment and  Interventions   Assessment:  Plan: Lidocain patch Add robaxin dose with tylenol       Kathlene Cote NP [facility]

## 2022-05-14 DIAGNOSIS — I5020 Unspecified systolic (congestive) heart failure: Secondary | ICD-10-CM | POA: Diagnosis not present

## 2022-05-14 DIAGNOSIS — I5023 Acute on chronic systolic (congestive) heart failure: Secondary | ICD-10-CM | POA: Diagnosis not present

## 2022-05-14 MED ORDER — SENNOSIDES-DOCUSATE SODIUM 8.6-50 MG PO TABS
1.0000 | ORAL_TABLET | Freq: Two times a day (BID) | ORAL | Status: DC
  Administered 2022-05-14 – 2022-05-16 (×4): 1 via ORAL
  Filled 2022-05-14 (×5): qty 1

## 2022-05-14 MED ORDER — BISACODYL 10 MG RE SUPP
10.0000 mg | Freq: Every day | RECTAL | Status: DC | PRN
  Administered 2022-05-14: 10 mg via RECTAL
  Filled 2022-05-14: qty 1

## 2022-05-14 NOTE — Progress Notes (Signed)
OT Cancellation Note  Patient Details Name: Cynthia Cook MRN: [id number] DOB: 10-Apr-1956   Cancelled Treatment:    Reason Eval/Treat Not Completed: Patient declined, no reason specified Patient reported that she just got washed up with nursing and needed a break. OT to continue to follow and check back as schedule will allow.  Rennie Plowman, MS Acute Rehabilitation Department Office# [phone]  05/14/2022, 2:14 PM

## 2022-05-14 NOTE — Progress Notes (Signed)
PT Cancellation Note  Patient Details Name: Cynthia Cook MRN: [id number] DOB: 1955-12-10   Cancelled Treatment:     PT deferred this pm, pt stating too fatigued after washing up with nursing and maybe will feel up to it late this afternoon.  Will follow.   Brandelyn Henne 05/14/2022, 2:26 PM

## 2022-05-14 NOTE — Progress Notes (Signed)
Patient requesting privacy on the commode.   Alert and oriented, she was asked to pull alarm cord in restroom for assistance when ready to be assisted to bed.   Patient verbalizes understanding and agrees to call out for assistance.

## 2022-05-14 NOTE — Progress Notes (Signed)
Patient safely assisted back to bed. Call bell within reach. Pain assessed and treated.  Denies any other needs.

## 2022-05-14 NOTE — Progress Notes (Signed)
Patient yelling out from the commode asking for suppository.   A plan was made to administer the suppository at 6 am, earlier in the shift 2/3 0754 pm. Patient was agreeable to the plan.   She is upset and demanding a suppository. Primary RN immediately available to assist. Patient demanding to self administer suppository.    [facility] paged, made aware.

## 2022-05-14 NOTE — Progress Notes (Signed)
Patient deferred wound care. Education was provided regarding the need for wound care and the risk for infection.

## 2022-05-14 NOTE — Progress Notes (Signed)
PROGRESS NOTE    Cynthia Cook  XAJ:[id number] DOB: 1955/08/01 DOA: 05/11/2022 PCP: Lurline Del, DO   Brief Narrative:  This 67 years old female with PMH significant for paroxysmal A-fib, prior PE s/p treatment not on anticoagulation, history of CVA, subdural hematoma, hypothyroidism, chronic back pain due to fracture of lumbar vertebrae, HFrEF 30 to 35%, PVD, gout, CKD stage IIIb with recent interim history of admission 04/16/2022 to 04/29/2022 with diagnosis of acute PE s/p which patient was discharged to rehab.  Patient now returned to the ED 3 days after discharge from rehab with complaints of increasing shortness of breath, lower extremity edema, abdominal distention for 1 week.  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  She believes this is the cause of her current symptoms.  She feels better status post lasix  given in the ED.  She is admitted for further evaluation.  Echocardiogram is pending.  Respiratory panel influenza A positive.  Assessment & Plan:   Principal Problem:   CHF exacerbation ([place]) Active Problems:   Acute on chronic combined systolic and diastolic CHF (congestive heart failure) (HCC)   HFrEF (heart failure with reduced ejection fraction) (HCC)  Acute exacerbation of HFr EF: Last Echo showed LVEF 30-35% Continue Lasix 40 mg IV twice daily Daily weight, daily intake output charting Echo this admission LVEF 35-40% Cardiology is consulted, Continue current management.   Intake/Output Summary (Last 24 hours) at 05/14/2022 1341 Last data filed at 05/14/2022 0815 Gross per 24 hour  Intake 240 ml  Output 650 ml  Net -410 ml     Influenza A+ Patient presented with worsening symptoms in the last 72 hours. Continue Tamiflu for 5 days. Continue supportive care. Patient without fever, URI symptoms.  Paroxysmal atrial fibrillation: Heart rate well-controlled.   Continue Zebeta and Eliquis.    History of PE: Diagnosed on 04/16/22 Continue Eliquis. She remains on room air.  Elevated troponin: Appears chronically weak, Trop 24 Patient denies any chest pain, EKG without acute changes.  Normocytic anemia: Hb stable. Continue to monitor.  CKD IIIb: Serum creatinine at baseline. Avoid nephrotoxic medications.   Hx of CVA and subdural hematomas No active issues.  Stable on Eliquis.   Hypothyroidism; Continue synthroid.   DVT prophylaxis: Eliquis Code Status: Full code. Family Communication: No family at bed side. Disposition Plan:   Status is: Inpatient Remains inpatient appropriate because: Admitted for acute systolic CHF requiring IV Lasix.  Echocardiogram shows improving LVEF.  Cardiology is consulted.    Consultants:  Cardiology  Procedures: Echo  Antimicrobials:  Anti-infectives (From admission, onward)    Start     Dose/Rate Route Frequency Ordered Stop   05/11/22 2200  oseltamivir (TAMIFLU) capsule 30 mg        30 mg Oral 2 times daily 05/11/22 2036 05/16/22 2159       Subjective: Patient was seen and examined at bedside. Overnight events noted.   Patient reports doing better, still reports having significant pedal edema and shortness of breath on exertion. Patient asked for Dulcolax suppository.  Objective: Vitals:   05/14/22 0300 05/14/22 0500 05/14/22 0557 05/14/22 0944  BP:   124/87   Pulse:   74   Resp: 19  16   Temp:   98.5 F (36.9 C)   TempSrc:   Oral   SpO2:   100% 100%  Weight:  74.6 kg    Height:  Intake/Output Summary (Last 24 hours) at 05/14/2022 1341 Last data filed at 05/14/2022 0815 Gross per 24 hour  Intake 240 ml  Output 650 ml  Net -410 ml   Filed Weights   05/12/22 0441 05/13/22 0500 05/14/22 0500  Weight: 75.4 kg 74.9 kg 74.6 kg    Examination:  General exam: Appears comfortable, NAD, deconditioned. Respiratory system: Bibasilar crackles, respiratory effort normal, RR 15 Cardiovascular system: S1 & S2  heard, regular rate and rhythm, no murmur. Gastrointestinal system: Abdomen is soft, non tender, non distended, BS+ Central nervous system: Alert and oriented X 3. No focal neurological deficits. Extremities: Edema++, no cyanosis, no clubbing Skin: No rashes, lesions or ulcers Psychiatry: Judgement and insight appear normal. Mood & affect appropriate.     Data Reviewed: I have personally reviewed following labs and imaging studies  CBC: Recent Labs  Lab 05/11/22 1505 05/12/22 0337  WBC 7.0 7.8  NEUTROABS 5.7  --   HGB 12.3 11.0*  HCT 40.4 36.7  MCV 90.0 88.9  PLT 229 062   Basic Metabolic Panel: Recent Labs  Lab 05/11/22 1505 05/12/22 0337  NA 140 140  K 4.6 4.7  CL 99 97*  CO2 31 31  GLUCOSE 107* 90  BUN 52* 56*  CREATININE 1.40* 1.56*  CALCIUM 8.7* 8.7*   GFR: Estimated Creatinine Clearance: 35.1 mL/min (A) (by C-G formula based on SCr of 1.56 mg/dL (H)). Liver Function Tests: Recent Labs  Lab 05/11/22 1505 05/12/22 0337  AST 30 24  ALT 35 27  ALKPHOS 75 62  BILITOT 1.4* 1.4*  PROT 7.4 6.3*  ALBUMIN 3.5 3.0*   No results for input(s): "LIPASE", "AMYLASE" in the last 168 hours. No results for input(s): "AMMONIA" in the last 168 hours. Coagulation Profile: No results for input(s): "INR", "PROTIME" in the last 168 hours. Cardiac Enzymes: No results for input(s): "CKTOTAL", "CKMB", "CKMBINDEX", "TROPONINI" in the last 168 hours. BNP (last 3 results) No results for input(s): "PROBNP" in the last 8760 hours. HbA1C: No results for input(s): "HGBA1C" in the last 72 hours. CBG: No results for input(s): "GLUCAP" in the last 168 hours. Lipid Profile: No results for input(s): "CHOL", "HDL", "LDLCALC", "TRIG", "CHOLHDL", "LDLDIRECT" in the last 72 hours. Thyroid Function Tests: No results for input(s): "TSH", "T4TOTAL", "FREET4", "T3FREE", "THYROIDAB" in the last 72 hours. Anemia Panel: No results for input(s): "VITAMINB12", "FOLATE", "FERRITIN", "TIBC",  "IRON", "RETICCTPCT" in the last 72 hours. Sepsis Labs: No results for input(s): "PROCALCITON", "LATICACIDVEN" in the last 168 hours.  Recent Results (from the past 240 hour(s))  Resp panel by RT-PCR (RSV, Flu A&B, Covid) Anterior Nasal Swab     Status: Abnormal   Collection Time: 05/11/22  4:08 PM   Specimen: Anterior Nasal Swab  Result Value Ref Range Status   SARS Coronavirus 2 by RT PCR NEGATIVE NEGATIVE Final    Comment: (NOTE) SARS-CoV-2 target nucleic acids are NOT DETECTED.  The SARS-CoV-2 RNA is generally detectable in upper respiratory specimens during the acute phase of infection. The lowest concentration of SARS-CoV-2 viral copies this assay can detect is 138 copies/mL. A negative result does not preclude SARS-Cov-2 infection and should not be used as the sole basis for treatment or other patient management decisions. A negative result may occur with  improper specimen collection/handling, submission of specimen other than nasopharyngeal swab, presence of viral mutation(s) within the areas targeted by this assay, and inadequate number of viral copies(<138 copies/mL). A negative result must be combined with clinical observations, patient history, and  epidemiological information. The expected result is Negative.  Fact Sheet for Patients:  EntrepreneurPulse.com.au  Fact Sheet for Healthcare Providers:  IncredibleEmployment.be  This test is no t yet approved or cleared by the [place] FDA and  has been authorized for detection and/or diagnosis of SARS-CoV-2 by FDA under an Emergency Use Authorization (EUA). This EUA will remain  in effect (meaning this test can be used) for the duration of the COVID-19 declaration under Section 564(b)(1) of the Act, 21 U.S.C.section 360bbb-3(b)(1), unless the authorization is terminated  or revoked sooner.       Influenza A by PCR POSITIVE (A) NEGATIVE Final   Influenza B by PCR NEGATIVE  NEGATIVE Final    Comment: (NOTE) The Xpert Xpress SARS-CoV-2/FLU/RSV plus assay is intended as an aid in the diagnosis of influenza from Nasopharyngeal swab specimens and should not be used as a sole basis for treatment. Nasal washings and aspirates are unacceptable for Xpert Xpress SARS-CoV-2/FLU/RSV testing.  Fact Sheet for Patients: EntrepreneurPulse.com.au  Fact Sheet for Healthcare Providers: IncredibleEmployment.be  This test is not yet approved or cleared by the [place] FDA and has been authorized for detection and/or diagnosis of SARS-CoV-2 by FDA under an Emergency Use Authorization (EUA). This EUA will remain in effect (meaning this test can be used) for the duration of the COVID-19 declaration under Section 564(b)(1) of the Act, 21 U.S.C. section 360bbb-3(b)(1), unless the authorization is terminated or revoked.     Resp Syncytial Virus by PCR NEGATIVE NEGATIVE Final    Comment: (NOTE) Fact Sheet for Patients: EntrepreneurPulse.com.au  Fact Sheet for Healthcare Providers: IncredibleEmployment.be  This test is not yet approved or cleared by the [place] FDA and has been authorized for detection and/or diagnosis of SARS-CoV-2 by FDA under an Emergency Use Authorization (EUA). This EUA will remain in effect (meaning this test can be used) for the duration of the COVID-19 declaration under Section 564(b)(1) of the Act, 21 U.S.C. section 360bbb-3(b)(1), unless the authorization is terminated or revoked.  Performed at [facility], [place] [address], [place], [place] [address]     Radiology Studies: ECHOCARDIOGRAM COMPLETE  Result Date: 05/12/2022    ECHOCARDIOGRAM REPORT   Patient Name:   Cynthia Cook Date of Exam: 05/12/2022 Medical Rec #:  [id number]      Height:       66.0 in Accession #:    [id number]     Weight:       166.3 lb Date of Birth:  Sep 08, 1955       BSA:           1.849 m Patient Age:    55 years       BP:           119/81 mmHg Patient Gender: F              HR:           104 bpm. Exam Location:  Inpatient Procedure: 2D Echo, Cardiac Doppler and Color Doppler Indications:    CHF-Acute Systolic Q59.56  History:        Patient has prior history of Echocardiogram examinations, most                 recent 04/30/2022. Aortic Valve Disease and Mitral Valve Disease,                 Arrythmias:Atrial Fibrillation; Signs/Symptoms:Murmur. Thyroid  disease. Pulmonary Embolism.  Sonographer:    Darlina Sicilian RDCS Referring Phys: [id number] [facility] A THOMAS  Sonographer Comments: Suboptimal subcostal window. Limited imaging due to patient positioning. IMPRESSIONS  1. Left ventricular ejection fraction, by estimation, is 35 to 40%. The left ventricle has moderately decreased function. The left ventricle demonstrates global hypokinesis. There is moderate concentric left ventricular hypertrophy. Left ventricular diastolic parameters are indeterminate.  2. Right ventricular systolic function was not well visualized. The right ventricular size is not well visualized.  3. Left atrial size was severely dilated.  4. MVA by deceleration time not likely accurate mean gradient 6 peak 11 mmHg at HR of 102 bpm . The mitral valve is abnormal. Moderate mitral valve regurgitation. Mild to moderate mitral stenosis. The mean mitral valve gradient is 5.7 mmHg. Moderate mitral annular calcification.  5. Mean gradient 10 peak 18 mmHg on TTE done 05/03/22 Poor CW interrogation likely moderate low flow AS . The aortic valve is tricuspid. There is moderate calcification of the aortic valve. Aortic valve regurgitation is mild. Mild aortic valve stenosis.  6. Aortic dilatation noted and Normal Ascending Aorta. There is mild dilatation of the ascending aorta, measuring 41 mm. FINDINGS  Left Ventricle: Left ventricular ejection fraction, by estimation, is 35 to 40%. The left ventricle has  moderately decreased function. The left ventricle demonstrates global hypokinesis. The left ventricular internal cavity size was normal in size. There is moderate concentric left ventricular hypertrophy. Left ventricular diastolic parameters are indeterminate. Right Ventricle: The right ventricular size is not well visualized. Right vetricular wall thickness was not assessed. Right ventricular systolic function was not well visualized. Left Atrium: Left atrial size was severely dilated. Right Atrium: Right atrial size was not well visualized. Pericardium: There is no evidence of pericardial effusion. Mitral Valve: MVA by deceleration time not likely accurate mean gradient 6 peak 11 mmHg at HR of 102 bpm. The mitral valve is abnormal. There is moderate thickening of the mitral valve leaflet(s). There is moderate calcification of the mitral valve leaflet(s). Moderate mitral annular calcification. Moderate mitral valve regurgitation. Mild to moderate mitral valve stenosis. MV peak gradient, 10.8 mmHg. The mean mitral valve gradient is 5.7 mmHg. Tricuspid Valve: The tricuspid valve is not well visualized. Tricuspid valve regurgitation is mild . No evidence of tricuspid stenosis. Aortic Valve: Mean gradient 10 peak 18 mmHg on TTE done 05/03/22 Poor CW interrogation likely moderate low flow AS. The aortic valve is tricuspid. There is moderate calcification of the aortic valve. Aortic valve regurgitation is mild. Mild aortic stenosis is present. Aortic valve mean gradient measures 6.0 mmHg. Aortic valve peak gradient measures 9.7 mmHg. Aortic valve area, by VTI measures 1.18 cm. Pulmonic Valve: The pulmonic valve was grossly normal. Pulmonic valve regurgitation is trivial. No evidence of pulmonic stenosis. Aorta: Aortic dilatation noted and Normal Ascending Aorta. There is mild dilatation of the ascending aorta, measuring 41 mm. IAS/Shunts: No atrial level shunt detected by color flow Doppler.  LEFT VENTRICLE PLAX 2D  LVIDd:         4.46 cm   Diastology LVIDs:         3.61 cm   LV e' medial:    3.96 cm/s LV PW:         1.52 cm   LV E/e' medial:  37.9 LV IVS:        1.71 cm   LV e' lateral:   4.73 cm/s LVOT diam:     1.90 cm   LV E/e' lateral:  31.7 LV SV:         29 LV SV Index:   16 LVOT Area:     2.84 cm  LEFT ATRIUM           Index LA Vol (A4C): 92.7 ml 50.14 ml/m  AORTIC VALVE AV Area (Vmax):    1.20 cm AV Area (Vmean):   1.13 cm AV Area (VTI):     1.18 cm AV Vmax:           155.50 cm/s AV Vmean:          115.050 cm/s AV VTI:            0.249 m AV Peak Grad:      9.7 mmHg AV Mean Grad:      6.0 mmHg LVOT Vmax:         65.55 cm/s LVOT Vmean:        45.850 cm/s LVOT VTI:          0.104 m LVOT/AV VTI ratio: 0.42  AORTA Ao Root diam: 3.50 cm Ao Asc diam:  4.10 cm MITRAL VALVE MV Area (PHT): 8.11 cm     SHUNTS MV Area VTI:   1.09 cm     Systemic VTI:  0.10 m MV Peak grad:  10.8 mmHg    Systemic Diam: 1.90 cm MV Mean grad:  5.7 mmHg MV Vmax:       1.65 m/s MV Vmean:      110.7 cm/s MV Decel Time: 94 msec MV E velocity: 150.00 cm/s Jenkins Rouge MD Electronically signed by Jenkins Rouge MD Signature Date/Time: 05/12/2022/3:22:51 PM    Final     Scheduled Meds:  apixaban  5 mg Oral BID   bisoprolol  5 mg Oral Daily   furosemide  40 mg Intravenous Q12H   levothyroxine  50 mcg Oral Q0600   methocarbamol  500 mg Oral Once   oseltamivir  30 mg Oral BID   senna-docusate  1 tablet Oral BID   Continuous Infusions:   LOS: 3 days    Time spent: 35 mins    Nelida Mandarino, MD [facility]   If 7PM-7AM, please contact night-coverage

## 2022-05-14 NOTE — Progress Notes (Signed)
Rounding Note    Patient Name: Leonette Tischer Date of Encounter: 05/14/2022  [place] Cardiologist: Buford Dresser, MD   Subjective   Breathing some better  No CP    Inpatient Medications    Scheduled Meds:  apixaban  5 mg Oral BID   bisoprolol  5 mg Oral Daily   ezetimibe  10 mg Oral Daily   furosemide  40 mg Intravenous Q12H   levothyroxine  50 mcg Oral Q0600   methocarbamol  500 mg Oral Once   oseltamivir  30 mg Oral BID   Continuous Infusions:  PRN Meds: acetaminophen **OR** acetaminophen, albuterol, ondansetron **OR** ondansetron (ZOFRAN) IV, traMADol   Vital Signs    Vitals:   05/14/22 0236 05/14/22 0300 05/14/22 0500 05/14/22 0557  BP:    124/87  Pulse:    74  Resp: '17 19  16  '$ Temp:    98.5 F (36.9 C)  TempSrc:    Oral  SpO2:    100%  Weight:   74.6 kg   Height:        Intake/Output Summary (Last 24 hours) at 05/14/2022 0756 Last data filed at 05/14/2022 0600 Gross per 24 hour  Intake 240 ml  Output 1150 ml  Net -910 ml      05/14/2022    5:00 AM 05/13/2022    5:00 AM 05/12/2022    4:41 AM  Last 3 Weights  Weight (lbs) 164 lb 7.4 oz 165 lb 3.2 oz 166 lb 4.8 oz  Weight (kg) 74.6 kg 74.934 kg 75.433 kg      Telemetry    Afib   70s  - Personally Reviewed  ECG    No new EKG  - Personally Reviewed  Physical Exam   GEN:  67 yo female appearing older than stated age  IN no acute distress.   Neck: No JVD Cardiac: Irreg irreg  No S3 Respiratory: Rhonchi bilateral with pops  GI: Soft, nontender, non-distended  MS: Tr LE  edema; No deformity. Neuro:  Nonfocal  Psych: Normal affect   Labs    High Sensitivity Troponin:   Recent Labs  Lab 04/29/22 0639 04/29/22 0849 05/11/22 1505 05/11/22 1638  TROPONINIHS 24* 24* 24* 25*     Chemistry Recent Labs  Lab 05/11/22 1505 05/12/22 0337  NA 140 140  K 4.6 4.7  CL 99 97*  CO2 31 31  GLUCOSE 107* 90  BUN 52* 56*  CREATININE 1.40* 1.56*  CALCIUM 8.7* 8.7*  PROT 7.4  6.3*  ALBUMIN 3.5 3.0*  AST 30 24  ALT 35 27  ALKPHOS 75 62  BILITOT 1.4* 1.4*  GFRNONAA 41* 36*  ANIONGAP 10 12    Lipids No results for input(s): "CHOL", "TRIG", "HDL", "LABVLDL", "LDLCALC", "CHOLHDL" in the last 168 hours.  Hematology Recent Labs  Lab 05/11/22 1505 05/12/22 0337  WBC 7.0 7.8  RBC 4.49 4.13  HGB 12.3 11.0*  HCT 40.4 36.7  MCV 90.0 88.9  MCH 27.4 26.6  MCHC 30.4 30.0  RDW 15.5 15.6*  PLT 229 205   Thyroid No results for input(s): "TSH", "FREET4" in the last 168 hours.  BNP Recent Labs  Lab 05/11/22 1506  BNP 790.4*    DDimer No results for input(s): "DDIMER" in the last 168 hours.   Radiology    ECHOCARDIOGRAM COMPLETE  Result Date: 05/12/2022    ECHOCARDIOGRAM REPORT   Patient Name:   REIKA CALLANAN Date of Exam: 05/12/2022 Medical Rec #:  [id number]  Height:       66.0 in Accession #:    [id number]     Weight:       166.3 lb Date of Birth:  1956-04-10       BSA:          1.849 m Patient Age:    29 years       BP:           119/81 mmHg Patient Gender: F              HR:           104 bpm. Exam Location:  Inpatient Procedure: 2D Echo, Cardiac Doppler and Color Doppler Indications:    CHF-Acute Systolic N86.76  History:        Patient has prior history of Echocardiogram examinations, most                 recent 04/30/2022. Aortic Valve Disease and Mitral Valve Disease,                 Arrythmias:Atrial Fibrillation; Signs/Symptoms:Murmur. Thyroid                 disease. Pulmonary Embolism.  Sonographer:    Darlina Sicilian RDCS Referring Phys: [id number] [facility] A TH[facility]  Sonographer Comments: Suboptimal subcostal window. Limited imaging due to patient positioning. IMPRESSIONS  1. Left ventricular ejection fraction, by estimation, is 35 to 40%. The left ventricle has moderately decreased function. The left ventricle demonstrates global hypokinesis. There is moderate concentric left ventricular hypertrophy. Left ventricular diastolic parameters are indeterminate.  2.  Right ventricular systolic function was not well visualized. The right ventricular size is not well visualized.  3. Left atrial size was severely dilated.  4. MVA by deceleration time not likely accurate mean gradient 6 peak 11 mmHg at HR of 102 bpm . The mitral valve is abnormal. Moderate mitral valve regurgitation. Mild to moderate mitral stenosis. The mean mitral valve gradient is 5.7 mmHg. Moderate mitral annular calcification.  5. Mean gradient 10 peak 18 mmHg on TTE done 05/03/22 Poor CW interrogation likely moderate low flow AS . The aortic valve is tricuspid. There is moderate calcification of the aortic valve. Aortic valve regurgitation is mild. Mild aortic valve stenosis.  6. Aortic dilatation noted and Normal Ascending Aorta. There is mild dilatation of the ascending aorta, measuring 41 mm. FINDINGS  Left Ventricle: Left ventricular ejection fraction, by estimation, is 35 to 40%. The left ventricle has moderately decreased function. The left ventricle demonstrates global hypokinesis. The left ventricular internal cavity size was normal in size. There is moderate concentric left ventricular hypertrophy. Left ventricular diastolic parameters are indeterminate. Right Ventricle: The right ventricular size is not well visualized. Right vetricular wall thickness was not assessed. Right ventricular systolic function was not well visualized. Left Atrium: Left atrial size was severely dilated. Right Atrium: Right atrial size was not well visualized. Pericardium: There is no evidence of pericardial effusion. Mitral Valve: MVA by deceleration time not likely accurate mean gradient 6 peak 11 mmHg at HR of 102 bpm. The mitral valve is abnormal. There is moderate thickening of the mitral valve leaflet(s). There is moderate calcification of the mitral valve leaflet(s). Moderate mitral annular calcification. Moderate mitral valve regurgitation. Mild to moderate mitral valve stenosis. MV peak gradient, 10.8 mmHg. The mean  mitral valve gradient is 5.7 mmHg. Tricuspid Valve: The tricuspid valve is not well visualized. Tricuspid valve regurgitation is mild . No evidence of tricuspid stenosis. Aortic  Valve: Mean gradient 10 peak 18 mmHg on TTE done 05/03/22 Poor CW interrogation likely moderate low flow AS. The aortic valve is tricuspid. There is moderate calcification of the aortic valve. Aortic valve regurgitation is mild. Mild aortic stenosis is present. Aortic valve mean gradient measures 6.0 mmHg. Aortic valve peak gradient measures 9.7 mmHg. Aortic valve area, by VTI measures 1.18 cm. Pulmonic Valve: The pulmonic valve was grossly normal. Pulmonic valve regurgitation is trivial. No evidence of pulmonic stenosis. Aorta: Aortic dilatation noted and Normal Ascending Aorta. There is mild dilatation of the ascending aorta, measuring 41 mm. IAS/Shunts: No atrial level shunt detected by color flow Doppler.  LEFT VENTRICLE PLAX 2D LVIDd:         4.46 cm   Diastology LVIDs:         3.61 cm   LV e' medial:    3.96 cm/s LV PW:         1.52 cm   LV E/e' medial:  37.9 LV IVS:        1.71 cm   LV e' lateral:   4.73 cm/s LVOT diam:     1.90 cm   LV E/e' lateral: 31.7 LV SV:         29 LV SV Index:   16 LVOT Area:     2.84 cm  LEFT ATRIUM           Index LA Vol (A4C): 92.7 ml 50.14 ml/m  AORTIC VALVE AV Area (Vmax):    1.20 cm AV Area (Vmean):   1.13 cm AV Area (VTI):     1.18 cm AV Vmax:           155.50 cm/s AV Vmean:          115.050 cm/s AV VTI:            0.249 m AV Peak Grad:      9.7 mmHg AV Mean Grad:      6.0 mmHg LVOT Vmax:         65.55 cm/s LVOT Vmean:        45.850 cm/s LVOT VTI:          0.104 m LVOT/AV VTI ratio: 0.42  AORTA Ao Root diam: 3.50 cm Ao Asc diam:  4.10 cm MITRAL VALVE MV Area (PHT): 8.11 cm     SHUNTS MV Area VTI:   1.09 cm     Systemic VTI:  0.10 m MV Peak grad:  10.8 mmHg    Systemic Diam: 1.90 cm MV Mean grad:  5.7 mmHg MV Vmax:       1.65 m/s MV Vmean:      110.7 cm/s MV Decel Time: 94 msec MV E velocity:  150.00 cm/s Jenkins Rouge MD Electronically signed by Jenkins Rouge MD Signature Date/Time: 05/12/2022/3:22:51 PM    Final     Cardiac Studies   Echo   2/2/ 2024  1. Left ventricular ejection fraction, by estimation, is 35 to 40%. The  left ventricle has moderately decreased function. The left ventricle  demonstrates global hypokinesis. There is moderate concentric left  ventricular hypertrophy. Left ventricular  diastolic parameters are indeterminate.   2. Right ventricular systolic function was not well visualized. The right  ventricular size is not well visualized.   3. Left atrial size was severely dilated.   4. MVA by deceleration time not likely accurate mean gradient 6 peak 11  mmHg at HR of 102 bpm . The mitral valve is abnormal. Moderate mitral  valve regurgitation. Mild to moderate mitral stenosis. The mean mitral  valve gradient is 5.7 mmHg. Moderate  mitral annular calcification.   5. Mean gradient 10 peak 18 mmHg on TTE done 05/03/22 Poor CW  interrogation likely moderate low flow AS . The aortic valve is tricuspid.  There is moderate calcification of the aortic valve. Aortic valve  regurgitation is mild. Mild aortic valve stenosis.   6. Aortic dilatation noted and Normal Ascending Aorta. There is mild  dilatation of the ascending aorta, measuring 41 mm.    Echo Sep 2023  1. Left ventricular ejection fraction, by estimation, is 55 to 60%. The  left ventricle has normal function. The left ventricle has no regional  wall motion abnormalities. There is mild left ventricular hypertrophy.  Left ventricular diastolic parameters  are indeterminate.   2. Right ventricular systolic function is normal. The right ventricular  size is normal. There is normal pulmonary artery systolic pressure.   3. Left atrial size was moderately dilated.   4. Right atrial size was moderately dilated.   5. Severe bulky posterior annular calcification mean diastolic gradient 5  mmHg but MVA  estimated at > 2 cm2 . The mitral valve is degenerative.  Moderate mitral valve regurgitation. No evidence of mitral stenosis.  Severe mitral annular calcification.   6. The aortic valve is tricuspid. There is moderate calcification of the  aortic valve. There is moderate thickening of the aortic valve. Aortic  valve regurgitation is mild. Mild aortic valve stenosis.   7. Aortic dilatation noted. There is moderate dilatation of the ascending  aorta, measuring 42 mm.   8. The inferior vena cava is dilated in size with <50% respiratory  variability, suggesting right atrial pressure of 15 mmHg.    Patient Profile     67 y.o. female  with hx of atrial fib, HFrEF (possible tachy induced), PE, atherosclerosis presents 05/12/22 with SOB , edema  and weakness  Assessment & Plan    1  Atrial fibrillation      CHADSVAScc score is 7    Rates have been pretty good    Continue anticoagulation and bisoprolol   2Hx PE  Submassive PE Jan 2024   Plan for indefinite Eliquis due to recurrent PE in the past  3  HFrEF.   LVEF normal in 2023   Echo in Jan 2024 (when pt presented in afib with RVR) was 30 to 35% Would continue rate control and repeat echo in the future      She is on bisoprolol now    I would hold ARB while diuresing     Consider hydralazine / NTG     Follow for right now       4Hx atherosclerosis   Atherosclerosis of aorta noted on Xray  5  HL  Pt on Zetia   has declined statins in the past  6  Renal  BUN 58/1.68  Better than in January   Would d/c IV lasix after today     7    Inluenza A  Rx with Tamiflu  8  Anemia   Will need to follow on anticoagluation     9  Hx CVA/subdural hematoma      10   Debility   Hx of falls    For questions or updates, please contact [place] Please consult [email or web address] for contact info under        Signed, Dorris Carnes, MD  05/14/2022, 7:56 AM

## 2022-05-14 NOTE — Progress Notes (Signed)
Mobility Specialist - Progress Note   05/14/22 1340  Mobility  Activity Ambulated with assistance to bathroom  Level of Assistance Minimal assist, patient does 75% or more  Assistive Device Front wheel walker  Distance Ambulated (ft) 10 ft  Range of Motion/Exercises Active  Activity Response Tolerated fair  Mobility Referral Yes  $Mobility charge 1 Mobility   Pt was found in bed and agreeable to ambulate. Stated having lower back pain once sitting EOB and needing to go to the bathroom. She then stated after bathroom use she did not want to ambulate more and that she was going to try to have a BM. Was min-A to go from sitting to standing and. RN entered room and assisted pt with further needs afterwards. Pt was told to pull call bell when finished or assistance needed and she acknowledged understanding.  Ferd Hibbs Mobility Specialist

## 2022-05-15 DIAGNOSIS — I5023 Acute on chronic systolic (congestive) heart failure: Secondary | ICD-10-CM | POA: Diagnosis not present

## 2022-05-15 DIAGNOSIS — I5041 Acute combined systolic (congestive) and diastolic (congestive) heart failure: Secondary | ICD-10-CM

## 2022-05-15 LAB — CBC
HCT: 37.7 % (ref 36.0–46.0)
Hemoglobin: 11.5 g/dL — ABNORMAL LOW (ref 12.0–15.0)
MCH: 27.4 pg (ref 26.0–34.0)
MCHC: 30.5 g/dL (ref 30.0–36.0)
MCV: 89.8 fL (ref 80.0–100.0)
Platelets: 190 10*3/uL (ref 150–400)
RBC: 4.2 MIL/uL (ref 3.87–5.11)
RDW: 15.7 % — ABNORMAL HIGH (ref 11.5–15.5)
WBC: 13.3 10*3/uL — ABNORMAL HIGH (ref 4.0–10.5)
nRBC: 0 % (ref 0.0–0.2)

## 2022-05-15 LAB — BASIC METABOLIC PANEL
Anion gap: 9 (ref 5–15)
BUN: 61 mg/dL — ABNORMAL HIGH (ref 8–23)
CO2: 39 mmol/L — ABNORMAL HIGH (ref 22–32)
Calcium: 8.5 mg/dL — ABNORMAL LOW (ref 8.9–10.3)
Chloride: 92 mmol/L — ABNORMAL LOW (ref 98–111)
Creatinine, Ser: 1.35 mg/dL — ABNORMAL HIGH (ref 0.44–1.00)
GFR, Estimated: 43 mL/min — ABNORMAL LOW (ref >60)
Glucose, Bld: 114 mg/dL — ABNORMAL HIGH (ref 70–99)
Potassium: 3.6 mmol/L (ref 3.5–5.1)
Sodium: 140 mmol/L (ref 135–145)

## 2022-05-15 MED ORDER — LEVOTHYROXINE SODIUM 50 MCG PO TABS
75.0000 ug | ORAL_TABLET | ORAL | Status: DC
  Administered 2022-05-16: 75 ug via ORAL
  Filled 2022-05-15: qty 1

## 2022-05-15 MED ORDER — LEVOTHYROXINE SODIUM 50 MCG PO TABS: 50.0000 ug | ORAL_TABLET | ORAL | Status: DC

## 2022-05-15 MED ORDER — SALINE SPRAY 0.65 % NA SOLN
1.0000 | NASAL | Status: DC | PRN
  Filled 2022-05-15 (×2): qty 44

## 2022-05-15 MED ORDER — BENZONATATE 100 MG PO CAPS
100.0000 mg | ORAL_CAPSULE | Freq: Three times a day (TID) | ORAL | Status: DC
  Administered 2022-05-15 – 2022-05-16 (×3): 100 mg via ORAL
  Filled 2022-05-15 (×3): qty 1

## 2022-05-15 MED ORDER — FUROSEMIDE 40 MG PO TABS
40.0000 mg | ORAL_TABLET | Freq: Every day | ORAL | Status: DC
  Administered 2022-05-16: 40 mg via ORAL
  Filled 2022-05-15: qty 1

## 2022-05-15 NOTE — Progress Notes (Addendum)
Rounding Note    Patient Name: Cynthia Cook Date of Encounter: 05/15/2022  [place] Cardiologist: Buford Dresser, MD   Subjective   Patient continues to cough. Feels like she is trying to clear mucus but it is too thick. Denies chest pain, palpitations, ankle edema.   Inpatient Medications    Scheduled Meds:  apixaban  5 mg Oral BID   bisoprolol  5 mg Oral Daily   furosemide  40 mg Intravenous Q12H   levothyroxine  50 mcg Oral Q0600   methocarbamol  500 mg Oral Once   oseltamivir  30 mg Oral BID   senna-docusate  1 tablet Oral BID   Continuous Infusions:  PRN Meds: acetaminophen **OR** acetaminophen, albuterol, bisacodyl, ondansetron **OR** ondansetron (ZOFRAN) IV, traMADol   Vital Signs    Vitals:   05/14/22 1810 05/14/22 2111 05/15/22 0437 05/15/22 0500  BP:  109/71 114/79   Pulse:  77 81   Resp:  20    Temp:  98.4 F (36.9 C) 98.1 F (36.7 C)   TempSrc:  Oral Oral   SpO2: 100% 96% 97%   Weight:    72.6 kg  Height:        Intake/Output Summary (Last 24 hours) at 05/15/2022 0901 Last data filed at 05/15/2022 0559 Gross per 24 hour  Intake 1080 ml  Output 1750 ml  Net -670 ml      05/15/2022    5:00 AM 05/14/2022    5:00 AM 05/13/2022    5:00 AM  Last 3 Weights  Weight (lbs) 160 lb 0.9 oz 164 lb 7.4 oz 165 lb 3.2 oz  Weight (kg) 72.6 kg 74.6 kg 74.934 kg      Telemetry    Atrial fibrillation, HR predominantly in the 80-90s - Personally Reviewed  ECG     No new tracings since 2/2- Personally Reviewed  Physical Exam   GEN: No acute distress.  Sitting upright in the bed  Neck: No JVD Cardiac: Irregular rate and rhythm, grade 2/6 systolic murmur throughout. Radial pulses 2+ bilaterally  Respiratory: Crackles that clear with cough. Expiratory wheezes. Normal WOB on 2 L via [place]  GI: Soft, nontender, non-distended  MS: No edema in BLE, wearing compression stockings. No deformity. Neuro:  Nonfocal  Psych: Normal affect   Labs     High Sensitivity Troponin:   Recent Labs  Lab 04/29/22 0639 04/29/22 0849 05/11/22 1505 05/11/22 1638  TROPONINIHS 24* 24* 24* 25*     Chemistry Recent Labs  Lab 05/11/22 1505 05/12/22 0337  NA 140 140  K 4.6 4.7  CL 99 97*  CO2 31 31  GLUCOSE 107* 90  BUN 52* 56*  CREATININE 1.40* 1.56*  CALCIUM 8.7* 8.7*  PROT 7.4 6.3*  ALBUMIN 3.5 3.0*  AST 30 24  ALT 35 27  ALKPHOS 75 62  BILITOT 1.4* 1.4*  GFRNONAA 41* 36*  ANIONGAP 10 12    Lipids No results for input(s): "CHOL", "TRIG", "HDL", "LABVLDL", "LDLCALC", "CHOLHDL" in the last 168 hours.  Hematology Recent Labs  Lab 05/11/22 1505 05/12/22 0337  WBC 7.0 7.8  RBC 4.49 4.13  HGB 12.3 11.0*  HCT 40.4 36.7  MCV 90.0 88.9  MCH 27.4 26.6  MCHC 30.4 30.0  RDW 15.5 15.6*  PLT 229 205   Thyroid No results for input(s): "TSH", "FREET4" in the last 168 hours.  BNP Recent Labs  Lab 05/11/22 1506  BNP 790.4*    DDimer No results for input(s): "DDIMER" in  the last 168 hours.   Radiology    No results found.  Cardiac Studies   Echo   2/2/ 2024   1. Left ventricular ejection fraction, by estimation, is 35 to 40%. The  left ventricle has moderately decreased function. The left ventricle  demonstrates global hypokinesis. There is moderate concentric left  ventricular hypertrophy. Left ventricular  diastolic parameters are indeterminate.   2. Right ventricular systolic function was not well visualized. The right  ventricular size is not well visualized.   3. Left atrial size was severely dilated.   4. MVA by deceleration time not likely accurate mean gradient 6 peak 11  mmHg at HR of 102 bpm . The mitral valve is abnormal. Moderate mitral  valve regurgitation. Mild to moderate mitral stenosis. The mean mitral  valve gradient is 5.7 mmHg. Moderate  mitral annular calcification.   5. Mean gradient 10 peak 18 mmHg on TTE done 05/03/22 Poor CW  interrogation likely moderate low flow AS . The aortic valve is  tricuspid.  There is moderate calcification of the aortic valve. Aortic valve  regurgitation is mild. Mild aortic valve stenosis.   6. Aortic dilatation noted and Normal Ascending Aorta. There is mild  dilatation of the ascending aorta, measuring 41 mm.      Echo Sep 2023   1. Left ventricular ejection fraction, by estimation, is 55 to 60%. The  left ventricle has normal function. The left ventricle has no regional  wall motion abnormalities. There is mild left ventricular hypertrophy.  Left ventricular diastolic parameters  are indeterminate.   2. Right ventricular systolic function is normal. The right ventricular  size is normal. There is normal pulmonary artery systolic pressure.   3. Left atrial size was moderately dilated.   4. Right atrial size was moderately dilated.   5. Severe bulky posterior annular calcification mean diastolic gradient 5  mmHg but MVA estimated at > 2 cm2 . The mitral valve is degenerative.  Moderate mitral valve regurgitation. No evidence of mitral stenosis.  Severe mitral annular calcification.   6. The aortic valve is tricuspid. There is moderate calcification of the  aortic valve. There is moderate thickening of the aortic valve. Aortic  valve regurgitation is mild. Mild aortic valve stenosis.   7. Aortic dilatation noted. There is moderate dilatation of the ascending  aorta, measuring 42 mm.   8. The inferior vena cava is dilated in size with <50% respiratory  variability, suggesting right atrial pressure of 15 mmHg.   Patient Profile     67 y.o. female with a past medical history of hx of atrial fib, HFrEF (possible tachy induced), PE, atherosclerosis. Presented  05/12/22 with SOB, edema and weakness   Assessment & Plan    Paroxysmal Atrial Fibrillation  - Per telemetry, HR predominantly well controlled and in the 80s-90s. Occasionally gets into the 100s  - Continue bisoprolol 5 mg daily, eliquis 5 mg BID  - Likely that influenza A and CHF  exacerbation are causing episodes of elevated heart rate, continue to follow HR with diuresis and management of influenza   Acute on Chronic HFrEF  - Echocardiogram this admission with EF 35-40%, moderate LVH - Dr. Judeth Cornfield rounding note from last admission noted that patient did not want cath, stents, or bypass surgery. She would prefer to start with medical therapies. Today, patient confirms that she would like to start with medical therapy, but would be open to invasive procedures in the future if needed.  - BNP elevated  this admission to 790.4. CXR showed small to moderate right pleural effusion and trace left pleural effusion  - Patient is currently being diuresed with IV lasix 40 mg BID-- output 1.75 L urine yesterday. Weight down 7 lbs since admission  - Patient has not had a BMP since 2/2. Ordered BMP this AM to assess renal function and electrolytes while patient is on diuretics  - She appears euvolemic on exam. Received IV lasix this AM. Plan to start PO lasix tomorrow  - Continue home bisoprolol 5 mg daily  - BMP pending, may be able to add low dose ARB this admission as patient stabilizes   HLD  -  Lipid panel from 12/2021 showed LDL 127, HDL 45, triglycerides 73  - Patient has been refusing home zetia. Has not been on a statin-- as patient is actively infected with Influenza A, this can be addressed as an outpatient   Moderate mitral valve regurgitation  Mild-Moderate Mitral stenosis  Moderate low flow AS  Mild dilation of the ascending aorta  - Will need to be followed as an outpatient-- patient reports that while she prefers medical management, she would be open to invasive procedures in the future if needed   Otherwise per primary  - Influenza A - History of PE  - Normocytic Anemia  - History of CVA and subdural hematomas - Hypothyroidism   For questions or updates, please contact [place] Please consult [email or web address] for contact info under     Signed, Margie Billet, PA-C  05/15/2022, 9:01 AM    Patient seen and examined, note reviewed with the signed Advanced Practice Provider. I personally reviewed laboratory data, imaging studies and relevant notes. I independently examined the patient and formulated the important aspects of the plan. I have personally discussed the plan with the patient and/or family. Comments or changes to the note/plan are indicated below.  Appears to be improving reduce swelling.  Continue IV we will plan to transition to p.o. diuretics in the morning. Continue rate control agent Eliquis for A-fib. Will continue to follow with you.  Berniece Salines DO, MS [facility] Attending Cardiologist [place]  [address] [place], [place] [address] [phone] Website: [email or web address]

## 2022-05-15 NOTE — Progress Notes (Signed)
PROGRESS NOTE    Cynthia Cook  UVO:[id number] DOB: [birth year] DOA: 05/11/2022 PCP: Lurline Del, DO   Brief Narrative:  This 67 years old female with PMH significant for paroxysmal A-fib, prior PE s/p treatment not on anticoagulation, history of CVA, subdural hematoma, hypothyroidism, chronic back pain due to fracture of lumbar vertebrae, HFrEF 30 to 35%, PVD, gout, CKD stage IIIb with recent interim history of admission 04/16/2022 to 04/29/2022 with diagnosis of acute PE s/p which patient was discharged to rehab.  Patient now returned to the ED 3 days after discharge from rehab with complaints of increasing shortness of breath, lower extremity edema, abdominal distention for 1 week.  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  She believes this is the cause of her current symptoms.  She feels better status post lasix  given in the ED.  She is admitted for further evaluation.  Echocardiogram is pending.  Respiratory panel influenza A positive.  Assessment & Plan:   Principal Problem:   CHF exacerbation ([place]) Active Problems:   Acute on chronic combined systolic and diastolic CHF (congestive heart failure) (HCC)   HFrEF (heart failure with reduced ejection fraction) (HCC)  Acute exacerbation of HFrEF: Last Echo showed LVEF 30-35% Continue Lasix 40 mg IV twice daily Daily weight, daily intake output charting Echo this admission LVEF 35-40% Cardiology is consulted, Continue current management. Urine output 1.75 L in 24 hours.  Weight down by 7 pounds since admission. She appears euvolemic on exam.  Will change to Lasix p.o. tomorrow.   Intake/Output Summary (Last 24 hours) at 05/15/2022 1203 Last data filed at 05/15/2022 0559 Gross per 24 hour  Intake 1080 ml  Output 1750 ml  Net -670 ml     Influenza A+ Patient presented with worsening symptoms in the last 72 hours. Continue Tamiflu for 5 days. Continue supportive  care. Patient without fever, URI symptoms.  Paroxysmal atrial fibrillation: Heart rate well-controlled.   Continue Zebeta and Eliquis.   History of PE: Diagnosed on 04/16/22 Continue Eliquis. She remains on room air.  Elevated troponin: Appears chronically weak, Trop 24 Patient denies any chest pain, EKG without acute changes.  Normocytic anemia: Hb stable. Continue to monitor.  CKD IIIb: Serum creatinine at baseline. Avoid nephrotoxic medications.   Hx of CVA and subdural hematomas No active issues.  Stable on Eliquis.   Hypothyroidism; Continue synthroid.   DVT prophylaxis: Eliquis Code Status: Full code. Family Communication: No family at bed side. Disposition Plan:   Status is: Inpatient Remains inpatient appropriate because: Admitted for acute systolic CHF requiring IV Lasix.  Echocardiogram shows improving LVEF.  Cardiology is consulted.  Continue current medications.    Consultants:  Cardiology  Procedures: Echo  Antimicrobials:  Anti-infectives (From admission, onward)    Start     Dose/Rate Route Frequency Ordered Stop   05/11/22 2200  oseltamivir (TAMIFLU) capsule 30 mg        30 mg Oral 2 times daily 05/11/22 2036 05/16/22 2159       Subjective: Patient was seen and examined at bedside. Overnight events noted.   Patient reports feeling much better,  still reports having shortness of breath and wheezing and cough. She does not want to be discharged today.  Objective: Vitals:   05/14/22 1810 05/14/22 2111 05/15/22 0437 05/15/22 0500  BP:  109/71 114/79   Pulse:  77 81   Resp:  20    Temp:  98.4 F (36.9 C) 98.1 F (36.7 C)   TempSrc:  Oral Oral   SpO2: 100% 96% 97%   Weight:    72.6 kg  Height:        Intake/Output Summary (Last 24 hours) at 05/15/2022 1203 Last data filed at 05/15/2022 0559 Gross per 24 hour  Intake 1080 ml  Output 1750 ml  Net -670 ml   Filed Weights   05/13/22 0500 05/14/22 0500 05/15/22 0500  Weight: 74.9 kg  74.6 kg 72.6 kg    Examination:  General exam: Appears comfortable, not in any acute distress, deconditioned. Respiratory system: CTA bilaterally , respiratory effort normal, RR 15. Cardiovascular system: S1 & S2 heard, regular rate and rhythm, no murmur. Gastrointestinal system: Abdomen is soft, non tender, non distended, BS+ Central nervous system: Alert and oriented X 3. No focal neurological deficits. Extremities: Edema++, no cyanosis, no clubbing Skin: No rashes, lesions or ulcers Psychiatry: Judgement and insight appear normal. Mood & affect appropriate.     Data Reviewed: I have personally reviewed following labs and imaging studies  CBC: Recent Labs  Lab 05/11/22 1505 05/12/22 0337 05/15/22 1003  WBC 7.0 7.8 13.3*  NEUTROABS 5.7  --   --   HGB 12.3 11.0* 11.5*  HCT 40.4 36.7 37.7  MCV 90.0 88.9 89.8  PLT 229 205 245   Basic Metabolic Panel: Recent Labs  Lab 05/11/22 1505 05/12/22 0337 05/15/22 1003  NA 140 140 140  K 4.6 4.7 3.6  CL 99 97* 92*  CO2 31 31 39*  GLUCOSE 107* 90 114*  BUN 52* 56* 61*  CREATININE 1.40* 1.56* 1.35*  CALCIUM 8.7* 8.7* 8.5*   GFR: Estimated Creatinine Clearance: 40.1 mL/min (A) (by C-G formula based on SCr of 1.35 mg/dL (H)). Liver Function Tests: Recent Labs  Lab 05/11/22 1505 05/12/22 0337  AST 30 24  ALT 35 27  ALKPHOS 75 62  BILITOT 1.4* 1.4*  PROT 7.4 6.3*  ALBUMIN 3.5 3.0*   No results for input(s): "LIPASE", "AMYLASE" in the last 168 hours. No results for input(s): "AMMONIA" in the last 168 hours. Coagulation Profile: No results for input(s): "INR", "PROTIME" in the last 168 hours. Cardiac Enzymes: No results for input(s): "CKTOTAL", "CKMB", "CKMBINDEX", "TROPONINI" in the last 168 hours. BNP (last 3 results) No results for input(s): "PROBNP" in the last 8760 hours. HbA1C: No results for input(s): "HGBA1C" in the last 72 hours. CBG: No results for input(s): "GLUCAP" in the last 168 hours. Lipid  Profile: No results for input(s): "CHOL", "HDL", "LDLCALC", "TRIG", "CHOLHDL", "LDLDIRECT" in the last 72 hours. Thyroid Function Tests: No results for input(s): "TSH", "T4TOTAL", "FREET4", "T3FREE", "THYROIDAB" in the last 72 hours. Anemia Panel: No results for input(s): "VITAMINB12", "FOLATE", "FERRITIN", "TIBC", "IRON", "RETICCTPCT" in the last 72 hours. Sepsis Labs: No results for input(s): "PROCALCITON", "LATICACIDVEN" in the last 168 hours.  Recent Results (from the past 240 hour(s))  Resp panel by RT-PCR (RSV, Flu A&B, Covid) Anterior Nasal Swab     Status: Abnormal   Collection Time: 05/11/22  4:08 PM   Specimen: Anterior Nasal Swab  Result Value Ref Range Status   SARS Coronavirus 2 by RT PCR NEGATIVE NEGATIVE Final    Comment: (NOTE) SARS-CoV-2 target nucleic acids are NOT DETECTED.  The SARS-CoV-2 RNA is generally detectable in upper respiratory specimens during the acute phase of infection. The lowest concentration of SARS-CoV-2 viral copies this assay can detect is 138 copies/mL. A negative result does not preclude SARS-Cov-2 infection and  should not be used as the sole basis for treatment or other patient management decisions. A negative result may occur with  improper specimen collection/handling, submission of specimen other than nasopharyngeal swab, presence of viral mutation(s) within the areas targeted by this assay, and inadequate number of viral copies(<138 copies/mL). A negative result must be combined with clinical observations, patient history, and epidemiological information. The expected result is Negative.  Fact Sheet for Patients:  EntrepreneurPulse.com.au  Fact Sheet for Healthcare Providers:  IncredibleEmployment.be  This test is no t yet approved or cleared by the [place] FDA and  has been authorized for detection and/or diagnosis of SARS-CoV-2 by FDA under an Emergency Use Authorization (EUA). This EUA  will remain  in effect (meaning this test can be used) for the duration of the COVID-19 declaration under Section 564(b)(1) of the Act, 21 U.S.C.section 360bbb-3(b)(1), unless the authorization is terminated  or revoked sooner.       Influenza A by PCR POSITIVE (A) NEGATIVE Final   Influenza B by PCR NEGATIVE NEGATIVE Final    Comment: (NOTE) The Xpert Xpress SARS-CoV-2/FLU/RSV plus assay is intended as an aid in the diagnosis of influenza from Nasopharyngeal swab specimens and should not be used as a sole basis for treatment. Nasal washings and aspirates are unacceptable for Xpert Xpress SARS-CoV-2/FLU/RSV testing.  Fact Sheet for Patients: EntrepreneurPulse.com.au  Fact Sheet for Healthcare Providers: IncredibleEmployment.be  This test is not yet approved or cleared by the [place] FDA and has been authorized for detection and/or diagnosis of SARS-CoV-2 by FDA under an Emergency Use Authorization (EUA). This EUA will remain in effect (meaning this test can be used) for the duration of the COVID-19 declaration under Section 564(b)(1) of the Act, 21 U.S.C. section 360bbb-3(b)(1), unless the authorization is terminated or revoked.     Resp Syncytial Virus by PCR NEGATIVE NEGATIVE Final    Comment: (NOTE) Fact Sheet for Patients: EntrepreneurPulse.com.au  Fact Sheet for Healthcare Providers: IncredibleEmployment.be  This test is not yet approved or cleared by the [place] FDA and has been authorized for detection and/or diagnosis of SARS-CoV-2 by FDA under an Emergency Use Authorization (EUA). This EUA will remain in effect (meaning this test can be used) for the duration of the COVID-19 declaration under Section 564(b)(1) of the Act, 21 U.S.C. section 360bbb-3(b)(1), unless the authorization is terminated or revoked.  Performed at [facility], [place] [address], [place], [place] [address]     Radiology Studies: No results found.  Scheduled Meds:  apixaban  5 mg Oral BID   bisoprolol  5 mg Oral Daily   [START ON 05/16/2022] furosemide  40 mg Oral Daily   [START ON 05/17/2022] levothyroxine  50 mcg Oral Q M,W,F   [START ON 05/16/2022] levothyroxine  75 mcg Oral Once per day on Sun Tue Thu Sat   methocarbamol  500 mg Oral Once   oseltamivir  30 mg Oral BID   senna-docusate  1 tablet Oral BID   Continuous Infusions:   LOS: 4 days    Time spent: 35 mins    Domnic Vantol, MD [facility]   If 7PM-7AM, please contact night-coverage

## 2022-05-15 NOTE — Evaluation (Signed)
Occupational Therapy Evaluation Patient Details Name: Cynthia Cook MRN: [id number] DOB: 01/10/1956 Today's Date: 05/15/2022   History of Present Illness Patient is a 67 year old female who presented with BLE edema, abdominal distension, and shortness of breath. Patient was admitted with CHF exacerbation. PMH: CVA, CKD, A fib, recent admission with PE 1/2 to 1/20, subdural hematoma, chronic back pain with Lumbar fx.   Clinical Impression   Patient is a 67 year old female who was admitted for above. Patient was living at home alone prior level. Currently, patient needs min guard for standing with decreased safety awareness.  Patient indicated that she would be going home alone at time of d/c. Patient was educated on importance of having caregiver support at home to reduce risk of rehospitalization. Patient verbalized understanding. Patient was noted to have decreased functional activity tolerance, decreased endurance, decreased standing balance, decreased safety awareness, and decreased knowledge of AD/AE impacting participation in ADLs. Patient would continue to benefit from skilled OT services at this time while admitted and after d/c to address noted deficits in order to improve overall safety and independence in ADLs.       Recommendations for follow up therapy are one component of a multi-disciplinary discharge planning process, led by the attending physician.  Recommendations may be updated based on patient status, additional functional criteria and insurance authorization.   Follow Up Recommendations  Home health OT     Assistance Recommended at Discharge Frequent or constant Supervision/Assistance  Patient can return home with the following A little help with walking and/or transfers;A lot of help with bathing/dressing/bathroom;Assistance with cooking/housework;Assist for transportation;Help with stairs or ramp for entrance    Functional Status Assessment  Patient has had a recent  decline in their functional status and demonstrates the ability to make significant improvements in function in a reasonable and predictable amount of time.  Equipment Recommendations  BSC/3in1       Precautions / Restrictions Precautions Precautions: Fall;Back Precaution Booklet Issued: No Precaution Comments: recent compression fx Restrictions Weight Bearing Restrictions: No      Mobility Bed Mobility Overal bed mobility: Needs Assistance Bed Mobility: Sit to Supine       Sit to supine: Min guard   General bed mobility comments: patient was up in recliner upon entrance to room and returned to bed at end of session even with education provided on importance of staying out of bed and off pure wic          Balance Overall balance assessment: Needs assistance Sitting-balance support: No upper extremity supported, Feet supported Sitting balance-Leahy Scale: Good     Standing balance support: Bilateral upper extremity supported, During functional activity Standing balance-Leahy Scale: Poor Standing balance comment: min guard for balance with patient kyphotic in posture         ADL either performed or assessed with clinical judgement   ADL Overall ADL's : Needs assistance/impaired Eating/Feeding: Modified independent;Sitting   Grooming: Set up;Sitting;Wash/dry face;Wash/dry hands   Upper Body Bathing: Sitting   Lower Body Bathing: Set up;Sitting/lateral leans Lower Body Bathing Details (indicate cue type and reason): EOB able to use figure four method sitting EOB Upper Body Dressing : Set up;Sitting   Lower Body Dressing: Minimal assistance;Sitting/lateral leans;With adaptive equipment Lower Body Dressing Details (indicate cue type and reason): patient declined using sock aid at home brings legs to bed level and donned compression stockings with min A with bag compensatory strategy educated on open toe compression stockings. Toilet Transfer: Aeronautical engineer  Transfer Details (indicate cue type and reason): with noted pushing walker out further infront of patient. pooor awareness of walker bumping into obstacles in navigation from bed to bathroom and back. was able to complete on RA with O2 96% Toileting- Clothing Manipulation and Hygiene: Min guard;Sit to/from stand                Pertinent Vitals/Pain Pain Assessment Pain Assessment: No/denies pain     Hand Dominance Right   Extremity/Trunk Assessment Upper Extremity Assessment Upper Extremity Assessment: Overall WFL for tasks assessed   Lower Extremity Assessment Lower Extremity Assessment: Defer to PT evaluation   Cervical / Trunk Assessment Cervical / Trunk Assessment: Kyphotic Cervical / Trunk Exceptions: recent compression fx   Communication Communication Communication: No difficulties   Cognition Arousal/Alertness: Awake/alert Behavior During Therapy: WFL for tasks assessed/performed Overall Cognitive Status: Within Functional Limits for tasks assessed       General Comments: patient was aware of safety concerns to d/c home. 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                Home Living Family/patient expects to be discharged to:: Private residence Living Arrangements: Alone Available Help at Discharge: Other (Comment) (few church friends) Type of Home: Apartment Home Access: Level entry     Home Layout: One level     Bathroom Shower/Tub: Teacher, early years/pre: Handicapped height     Home Equipment: Cane - quad;Grab bars - tub/shower;Grab bars - toilet   Additional Comments: has grab bars in shower and next to the toilet.      Prior Functioning/Environment Prior Level of Function : Independent/Modified Independent             Mobility Comments: using quad cane for mobility, ADLs Comments: reports not driving recently, independent basic ADLs but unable to shower since fall        OT  Problem List: Decreased strength;Decreased activity tolerance;Impaired balance (sitting and/or standing);Pain;Obesity;Cardiopulmonary status limiting activity;Decreased knowledge of precautions;Decreased knowledge of use of DME or AE      OT Treatment/Interventions: Self-care/ADL training;Therapeutic exercise;DME and/or AE instruction;Therapeutic activities;Patient/family education;Balance training;Energy conservation    OT Goals(Current goals can be found in the care plan section) Acute Rehab OT Goals Patient Stated Goal: to get home OT Goal Formulation: With patient Time For Goal Achievement: 05/29/22 Potential to Achieve Goals: Fair  OT Frequency: Min 2X/week       AM-PAC OT "6 Clicks" Daily Activity     Outcome Measure Help from another person eating meals?: None Help from another person taking care of personal grooming?: A Little Help from another person toileting, which includes using toliet, bedpan, or urinal?: A Little Help from another person bathing (including washing, rinsing, drying)?: A Lot Help from another person to put on and taking off regular upper body clothing?: A Little Help from another person to put on and taking off regular lower body clothing?: A Little 6 Click Score: 18   End of Session Equipment Utilized During Treatment: Gait belt;Oxygen Nurse Communication: Mobility status;Other (comment) (request for wound nurse to look at bottom)  Activity Tolerance: Patient tolerated treatment well Patient left: in bed;with call bell/phone within reach  OT Visit Diagnosis: Other abnormalities of gait and mobility (R26.89);Muscle weakness (generalized) (M62.81);Pain;History of falling (Z91.81)                Time: 0177-9390 OT Time Calculation (min): 46 min Charges:  OT General Charges $OT Visit: 1 Visit OT Evaluation $OT Eval  Moderate Complexity: 1 Mod OT Treatments $Self Care/Home Management : 23-37 mins  Rennie Plowman, MS Acute Rehabilitation  Department Office# [phone]   Willa Rough 05/15/2022, 11:53 AM

## 2022-05-15 NOTE — Evaluation (Signed)
Physical Therapy Evaluation Patient Details Name: Cynthia Cook MRN: [id number] DOB: 1955-06-23 Today's Date: 05/15/2022  History of Present Illness  Patient is a 67 year old female who presented with BLE edema, abdominal distension, and shortness of breath. Patient was admitted with CHF exacerbation. PMH: CVA, CKD, A fib, recent admission with PE 1/2 to 1/20, subdural hematoma, chronic back pain with Lumbar fx.  Clinical Impression  Pt admitted with above diagnosis.  Pt currently with functional limitations due to the deficits listed below (see PT Problem List). Pt will benefit from skilled PT to increase their independence and safety with mobility to allow discharge to the venue listed below.   Pt assisted to/from bathroom and then ambulated around room.  Pt utilizing RW at this time and reports she has walker at home.  Pt will not return to SNF and states she is discharging home.  Pt states she has chairs throughout apartment if she needs to take a seated rest break (which friend, Estill Bamberg, present today and confirms).  Pt encouraged to continue mobilizing with staff (ie into bathroom, work with mobility specialist, etc) in preparation for d/c home which she is agreeable at this time.        Recommendations for follow up therapy are one component of a multi-disciplinary discharge planning process, led by the attending physician.  Recommendations may be updated based on patient status, additional functional criteria and insurance authorization.  Follow Up Recommendations Home health PT      Assistance Recommended at Discharge Frequent or constant Supervision/Assistance  Patient can return home with the following  A little help with walking and/or transfers;Assistance with cooking/housework;Assist for transportation;A little help with bathing/dressing/bathroom;Help with stairs or ramp for entrance    Equipment Recommendations Other (comment) (pt states she has a RW)  Recommendations for Other  Services       Functional Status Assessment Patient has had a recent decline in their functional status and demonstrates the ability to make significant improvements in function in a reasonable and predictable amount of time.     Precautions / Restrictions Precautions Precautions: Fall;Back Precaution Comments: recent compression fx Restrictions Weight Bearing Restrictions: No      Mobility  Bed Mobility               General bed mobility comments: pt sitting EOB on arrival    Transfers Overall transfer level: Needs assistance Equipment used: Rolling walker (2 wheels) Transfers: Sit to/from Stand Sit to Stand: Min guard, Min assist           General transfer comment: min assist from regular height bed and toilet; min/guard from recliner with use of armrests    Ambulation/Gait Ambulation/Gait assistance: Min guard   Assistive device: Rolling walker (2 wheels) Gait Pattern/deviations: Step-through pattern, Decreased stride length, Trunk flexed, Wide base of support       General Gait Details: pt tends to keep RW too far forward, increased toe out observed, pt did not have unsteadiness or LOB, ambulated to/from bathroom approx 24 ft then around room after short rest break in recliner (which SPO2 was 93% on room air) another 50 ft  Stairs            Wheelchair Mobility    Modified Rankin (Stroke Patients Only)       Balance  Pertinent Vitals/Pain Pain Assessment Pain Assessment: No/denies pain Pain Intervention(s): Premedicated before session    [place] expects to be discharged to:: Private residence Living Arrangements: Alone Available Help at Discharge: Other (Comment) (few church friends) Type of Home: Apartment Home Access: Level entry       Home Layout: One level Home Equipment: Cane - quad;Grab bars - tub/shower;Grab bars - toilet;Rolling Walker (2  wheels) Additional Comments: has grab bars in shower and next to the toilet.    Prior Function Prior Level of Function : Independent/Modified Independent             Mobility Comments: using quad cane for mobility prior to recent hospitalization ADLs Comments: reports not driving recently, independent basic ADLs but unable to shower since fall     Hand Dominance   Dominant Hand: Right    Extremity/Trunk Assessment        Lower Extremity Assessment Lower Extremity Assessment: Generalized weakness    Cervical / Trunk Assessment Cervical / Trunk Assessment: Kyphotic Cervical / Trunk Exceptions: recent compression fx  Communication   Communication: No difficulties  Cognition Arousal/Alertness: Awake/alert Behavior During Therapy: WFL for tasks assessed/performed Overall Cognitive Status: Within Functional Limits for tasks assessed                                 General Comments: patient was aware of safety concerns to d/c home. 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        General Comments      Exercises     Assessment/Plan    PT Assessment Patient needs continued PT services  PT Problem List Decreased strength;Decreased balance;Decreased knowledge of precautions;Decreased mobility;Decreased activity tolerance;Cardiopulmonary status limiting activity       PT Treatment Interventions DME instruction;Functional mobility training;Patient/family education;Balance training;Therapeutic activities;Gait training;Therapeutic exercise    PT Goals (Current goals can be found in the Care Plan section)  Acute Rehab PT Goals PT Goal Formulation: With patient Time For Goal Achievement: 05/29/22 Potential to Achieve Goals: Good    Frequency Min 3X/week     Co-evaluation               AM-PAC PT "6 Clicks" Mobility  Outcome Measure Help needed turning from your back to your side while in a flat bed without using bedrails?: A Little Help needed  moving from lying on your back to sitting on the side of a flat bed without using bedrails?: A Little Help needed moving to and from a bed to a chair (including a wheelchair)?: A Little Help needed standing up from a chair using your arms (e.g., wheelchair or bedside chair)?: A Little Help needed to walk in hospital room?: A Little Help needed climbing 3-5 steps with a railing? : A Lot 6 Click Score: 17    End of Session   Activity Tolerance: Patient tolerated treatment well Patient left: in chair;with call bell/phone within reach;with family/visitor present Nurse Communication: Mobility status PT Visit Diagnosis: Other abnormalities of gait and mobility (R26.89)    Time: 0086-7619 PT Time Calculation (min) (ACUTE ONLY): 21 min   Charges:   PT Evaluation $PT Eval Low Complexity: 1 Low         Kati PT, DPT Physical Therapist Acute Rehabilitation Services Preferred contact method: Secure Chat Weekend Pager Only: [phone] Office: [place] 05/15/2022, 4:22 PM

## 2022-05-16 DIAGNOSIS — I5023 Acute on chronic systolic (congestive) heart failure: Secondary | ICD-10-CM | POA: Diagnosis not present

## 2022-05-16 LAB — CBC
HCT: 37.6 % (ref 36.0–46.0)
Hemoglobin: 11.3 g/dL — ABNORMAL LOW (ref 12.0–15.0)
MCH: 27.3 pg (ref 26.0–34.0)
MCHC: 30.1 g/dL (ref 30.0–36.0)
MCV: 90.8 fL (ref 80.0–100.0)
Platelets: 180 10*3/uL (ref 150–400)
RBC: 4.14 MIL/uL (ref 3.87–5.11)
RDW: 16 % — ABNORMAL HIGH (ref 11.5–15.5)
WBC: 10.3 10*3/uL (ref 4.0–10.5)
nRBC: 0 % (ref 0.0–0.2)

## 2022-05-16 LAB — BASIC METABOLIC PANEL
Anion gap: 12 (ref 5–15)
BUN: 57 mg/dL — ABNORMAL HIGH (ref 8–23)
CO2: 34 mmol/L — ABNORMAL HIGH (ref 22–32)
Calcium: 8.7 mg/dL — ABNORMAL LOW (ref 8.9–10.3)
Chloride: 94 mmol/L — ABNORMAL LOW (ref 98–111)
Creatinine, Ser: 1.33 mg/dL — ABNORMAL HIGH (ref 0.44–1.00)
GFR, Estimated: 43 mL/min — ABNORMAL LOW (ref >60)
Glucose, Bld: 105 mg/dL — ABNORMAL HIGH (ref 70–99)
Potassium: 3.9 mmol/L (ref 3.5–5.1)
Sodium: 140 mmol/L (ref 135–145)

## 2022-05-16 LAB — PHOSPHORUS: Phosphorus: 3.3 mg/dL (ref 2.5–4.6)

## 2022-05-16 LAB — MAGNESIUM: Magnesium: 2.2 mg/dL (ref 1.7–2.4)

## 2022-05-16 MED ORDER — FUROSEMIDE 40 MG PO TABS: 40.0000 mg | ORAL_TABLET | Freq: Every day | ORAL | 2 refills | Status: AC

## 2022-05-16 NOTE — Discharge Instructions (Signed)
Advised to follow-up with primary care physician in 1 week. Advised to follow-up with cardiology as scheduled. Advised to take bisoprolol and Eliquis for atrial fibrillation. Advised to take Lasix 40 mg daily for CHF.

## 2022-05-16 NOTE — Discharge Summary (Signed)
Physician Discharge Summary  Cynthia Cook JHE:[id number] DOB: [birth year] DOA: 05/11/2022  PCP: Lurline Del, DO  Admit date: 05/11/2022  Discharge date: 05/16/2022  Admitted From: Home.  Disposition:  Home Health Services.  Recommendations for Outpatient Follow-up:  Follow up with PCP in 1-2 weeks. Please obtain BMP/CBC in one week. Advised to follow-up with Cardiology as scheduled. Advised to take bisoprolol and Eliquis for atrial fibrillation. Advised to take Lasix 40 mg daily for CHF.  Home Health:Home health Services Equipment/Devices:None  Discharge Condition: Stable CODE STATUS:Full code Diet recommendation: Heart Healthy  Brief [facility] Course: This 67 years old female with PMH significant for paroxysmal A-fib, prior PE s/p treatment not on anticoagulation, history of CVA, Subdural hematoma, Hypothyroidism, chronic back pain due to fracture of lumbar vertebrae, HFrEF with LVEF 30 to 35%, PVD, gout, CKD stage IIIb with recent interim history of admission 04/16/2022 to 04/29/2022 with diagnosis of acute PE s/p which patient was discharged to rehab. Patient now returned to the ED 3 days after discharge from rehab with complaints of increasing shortness of breath, lower extremity edema, abdominal distention for 1 week. 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 She believes this is the cause of her current symptoms. She feels better,  status post lasix given in the ED. She was admitted for further evaluation. Respiratory panel influenza A positive.  Patient was continued on Tamiflu, and continued on IV Lasix.  Cardiology was consulted,  agreed with IV diuresis.  Patient has made significant improvement.  She is 8 pounds down negative balance.  Medications changed to oral Lasix.  She completed 5 days of Tamiflu in the hospital.  She feels better and wants to be discharged.  Home health services arranged.  Patient is  being discharged home. Cardiology signed off.  Discharge Diagnoses:  Principal Problem:   CHF exacerbation ([place]) Active Problems:   Acute on chronic combined systolic and diastolic CHF (congestive heart failure) (HCC)   HFrEF (heart failure with reduced ejection fraction) (HCC)  Acute exacerbation of HFrEF: Last Echo showed LVEF 30-35% Continue Lasix 40 mg IV twice daily Daily weight, daily intake output charting Echo this admission LVEF 35-40% Cardiology is consulted, Continue current management. Urine output 1.75 L in 24 hours.  Weight down by 8 pounds since admission. She appears euvolemic on exam.  Lasix changed to 40 mg daily.     Intake/Output Summary (Last 24 hours) at 05/15/2022 1203 Last data filed at 05/15/2022 0559    Gross per 24 hour  Intake 1080 ml  Output 1750 ml  Net -670 ml     Influenza A+ Patient presented with worsening symptoms in the last 72 hours. Completed Tamiflu for 5 days. Continue supportive care. Patient without fever, URI symptoms.   Paroxysmal atrial fibrillation: Heart rate well-controlled.   Continue Zebeta and Eliquis.   History of PE: Diagnosed on 04/16/22 Continue Eliquis. She remains on room air.   Elevated troponin: Appears chronically weak, Trop 24 Patient denies any chest pain, EKG without acute changes.   Normocytic anemia: Hb stable. Continue to monitor.   CKD IIIb: Serum creatinine at baseline. Avoid nephrotoxic medications.   Hx of CVA and subdural hematomas No active issues.  Stable on Eliquis.   Hypothyroidism; Continue synthroid.  Discharge Instructions  Discharge Instructions     Call MD for:  persistant dizziness or light-headedness   Complete by: As directed    Call MD for:  persistant nausea and vomiting   Complete by: As directed    Diet - low sodium heart healthy   Complete by: As directed    Diet Carb Modified   Complete by: As directed    Discharge instructions   Complete by: As directed     Advised to follow-up with primary care physician in 1 week. Advised to follow-up with cardiology as scheduled. Advised to take bisoprolol and Eliquis for atrial fibrillation. Advised to take Lasix 40 mg daily for CHF.   Increase activity slowly   Complete by: As directed    No wound care   Complete by: As directed       Allergies as of 05/16/2022       Reactions   Codeine Nausea Only   Duragesic-100 [fentanyl] Nausea And Vomiting   Dizziness Lightheadedness    Mucinex [guaifenesin Er] Other (See Comments)   Syncope    Nsaids Other (See Comments)   Kidney disease        Medication List     STOP taking these medications    predniSONE 10 MG tablet Commonly known as: DELTASONE       TAKE these medications    apixaban 5 MG Tabs tablet Commonly known as: ELIQUIS Take 2 tablets (10 mg total) by mouth 2 (two) times daily for 3 days, THEN 1 tablet (5 mg total) 2 (two) times daily. Start taking on: May 05, 2022 What changed: See the new instructions.   b complex vitamins capsule Take 1 capsule by mouth daily.   bisoprolol 5 MG tablet Commonly known as: ZEBETA Take 1 tablet (5 mg total) by mouth daily.   ezetimibe 10 MG tablet Commonly known as: Zetia Take 1 tablet (10 mg total) by mouth daily.   furosemide 40 MG tablet Commonly known as: LASIX Take 1 tablet (40 mg total) by mouth daily. What changed:  medication strength how much to take   levothyroxine 50 MCG tablet Commonly known as: SYNTHROID Take 1 tablet (50 mcg) by mouth on Monday, Wednesday, and Friday What changed:  how much to take how to take this when to take this additional instructions   levothyroxine 75 MCG tablet Commonly known as: SYNTHROID Take 1 tablet (75 mcg) by mouth on Tuesday, Thursday, Saturday, and Sunday What changed:  how much to take how to take this when to take this additional instructions   OVER THE COUNTER MEDICATION Take 1 tablet by mouth daily with supper.  Calcium + D3 + K2   Peroxyl 1.5 % Soln Generic drug: hydrogen peroxide Gargle, swish and spit daily What changed:  how much to take how to take this when to take this additional instructions   senna 8.6 MG tablet Commonly known as: SENOKOT Take 1 tablet by mouth daily.   vitamin C 1000 MG tablet Take 4,000 mg by mouth daily.   vitamin E 180 MG (400 UNITS) capsule Take 400 Units by mouth daily.        Follow-up Information     Lurline Del, DO Follow up in 1 week(s).   Specialty: Family Medicine Contact information: [place] [address] [phone]         Berniece Salines, DO Follow up in 1 week(s).   Specialty: Cardiology Contact information: [address] [place] [address] [phone]         [facility] Follow up.   Why: Home Health Contact information: [place] [address]  [phone]                Allergies  Allergen Reactions   Codeine Nausea Only   Duragesic-100 [Fentanyl] Nausea And Vomiting    Dizziness Lightheadedness    Mucinex [Guaifenesin Er] Other (See Comments)    Syncope    Nsaids Other (See Comments)    Kidney disease    Consultations: Cardiology   Procedures/Studies: ECHOCARDIOGRAM COMPLETE  Result Date: 05/12/2022    ECHOCARDIOGRAM REPORT   Patient Name:   Cynthia Cook Date of Exam: 05/12/2022 Medical Rec #:  [id number]      Height:       66.0 in Accession #:    [id number]     Weight:       166.3 lb Date of Birth:  1955/08/14       BSA:          1.849 m Patient Age:    67 years       BP:           119/81 mmHg Patient Gender: F              HR:           104 bpm. Exam Location:  Inpatient Procedure: 2D Echo, Cardiac Doppler and Color Doppler Indications:    CHF-Acute Systolic I94.85  History:        Patient has prior history of Echocardiogram examinations, most                 recent 04/30/2022. Aortic Valve Disease and Mitral Valve Disease,                  Arrythmias:Atrial Fibrillation; Signs/Symptoms:Murmur. Thyroid                 disease. Pulmonary Embolism.  Sonographer:    Darlina Sicilian RDCS Referring Phys: [id number] [facility] A THOMAS  Sonographer Comments: Suboptimal subcostal window. Limited imaging due to patient positioning. IMPRESSIONS  1. Left ventricular ejection fraction, by estimation, is 35 to 40%. The left ventricle has moderately decreased function. The left ventricle demonstrates global hypokinesis. There is moderate concentric left ventricular hypertrophy. Left ventricular diastolic parameters are indeterminate.  2. Right ventricular systolic function was not well visualized. The right ventricular size is not well visualized.  3. Left atrial size was severely dilated.  4. MVA by deceleration time not likely accurate mean gradient 6 peak 11 mmHg at HR of 102 bpm . The mitral valve is abnormal. Moderate mitral valve regurgitation. Mild to moderate mitral stenosis. The mean mitral valve gradient is 5.7 mmHg. Moderate mitral annular calcification.  5. Mean gradient 10 peak 18 mmHg on TTE done 05/03/22 Poor CW interrogation likely moderate low flow AS . The aortic valve is tricuspid. There is moderate calcification of the aortic valve. Aortic valve regurgitation is mild. Mild aortic valve stenosis.  6. Aortic dilatation noted and Normal Ascending Aorta. There is mild dilatation of the ascending aorta, measuring 41 mm. FINDINGS  Left Ventricle: Left ventricular ejection fraction, by estimation, is 35 to 40%. The left ventricle has moderately decreased function. The left ventricle demonstrates global hypokinesis. The left ventricular internal cavity size was normal in size. There is moderate concentric left ventricular hypertrophy. Left ventricular diastolic parameters are indeterminate. Right Ventricle: The right ventricular size is not well visualized. Right vetricular wall thickness was not assessed. Right ventricular systolic function was not well  visualized. Left Atrium: Left atrial size was severely dilated. Right Atrium: Right atrial size  was not well visualized. Pericardium: There is no evidence of pericardial effusion. Mitral Valve: MVA by deceleration time not likely accurate mean gradient 6 peak 11 mmHg at HR of 102 bpm. The mitral valve is abnormal. There is moderate thickening of the mitral valve leaflet(s). There is moderate calcification of the mitral valve leaflet(s). Moderate mitral annular calcification. Moderate mitral valve regurgitation. Mild to moderate mitral valve stenosis. MV peak gradient, 10.8 mmHg. The mean mitral valve gradient is 5.7 mmHg. Tricuspid Valve: The tricuspid valve is not well visualized. Tricuspid valve regurgitation is mild . No evidence of tricuspid stenosis. Aortic Valve: Mean gradient 10 peak 18 mmHg on TTE done 05/03/22 Poor CW interrogation likely moderate low flow AS. The aortic valve is tricuspid. There is moderate calcification of the aortic valve. Aortic valve regurgitation is mild. Mild aortic stenosis is present. Aortic valve mean gradient measures 6.0 mmHg. Aortic valve peak gradient measures 9.7 mmHg. Aortic valve area, by VTI measures 1.18 cm. Pulmonic Valve: The pulmonic valve was grossly normal. Pulmonic valve regurgitation is trivial. No evidence of pulmonic stenosis. Aorta: Aortic dilatation noted and Normal Ascending Aorta. There is mild dilatation of the ascending aorta, measuring 41 mm. IAS/Shunts: No atrial level shunt detected by color flow Doppler.  LEFT VENTRICLE PLAX 2D LVIDd:         4.46 cm   Diastology LVIDs:         3.61 cm   LV e' medial:    3.96 cm/s LV PW:         1.52 cm   LV E/e' medial:  37.9 LV IVS:        1.71 cm   LV e' lateral:   4.73 cm/s LVOT diam:     1.90 cm   LV E/e' lateral: 31.7 LV SV:         29 LV SV Index:   16 LVOT Area:     2.84 cm  LEFT ATRIUM           Index LA Vol (A4C): 92.7 ml 50.14 ml/m  AORTIC VALVE AV Area (Vmax):    1.20 cm AV Area (Vmean):   1.13 cm AV  Area (VTI):     1.18 cm AV Vmax:           155.50 cm/s AV Vmean:          115.050 cm/s AV VTI:            0.249 m AV Peak Grad:      9.7 mmHg AV Mean Grad:      6.0 mmHg LVOT Vmax:         65.55 cm/s LVOT Vmean:        45.850 cm/s LVOT VTI:          0.104 m LVOT/AV VTI ratio: 0.42  AORTA Ao Root diam: 3.50 cm Ao Asc diam:  4.10 cm MITRAL VALVE MV Area (PHT): 8.11 cm     SHUNTS MV Area VTI:   1.09 cm     Systemic VTI:  0.10 m MV Peak grad:  10.8 mmHg    Systemic Diam: 1.90 cm MV Mean grad:  5.7 mmHg MV Vmax:       1.65 m/s MV Vmean:      110.7 cm/s MV Decel Time: 94 msec MV E velocity: 150.00 cm/s Jenkins Rouge MD Electronically signed by Jenkins Rouge MD Signature Date/Time: 05/12/2022/3:22:51 PM    Final    DG Chest Port 1 View  Result Date:  05/11/2022 CLINICAL DATA:  Shortness of breath worsening over the last 3 days, extensive pulmonary embolus diagnosed on 04/29/2022 (treated with Eliquis, although reportedly the patient is not taking the medication for 3 days). EXAM: PORTABLE CHEST 1 VIEW COMPARISON:  Chest CT 04/29/2022 FINDINGS: Blunted right costophrenic angle compatible with small to moderate right pleural effusion. There is some subtle blunting of the left lateral costophrenic angle suggesting trace left pleural effusion. The patient is rotated to the right on today's radiograph, reducing diagnostic sensitivity and specificity. Reverse lordotic projection. Atherosclerotic calcification of the aortic arch. Mild enlargement of the cardiopericardial silhouette. Markedly striking mitral valve calcification. No new airspace opacity is identified. IMPRESSION: 1. Small to moderate right pleural effusion. 2. Trace left pleural effusion. 3. Mild enlargement of the cardiopericardial silhouette. 4. Markedly striking mitral valve calcification. 5. Atherosclerotic calcification of the aortic arch. 6. Reverse lordotic projection. Electronically Signed   By: Van Clines M.D.   On: 05/11/2022 15:00   DG  Sacrum/Coccyx  Result Date: 05/05/2022 CLINICAL DATA:  Lower back pain EXAM: SACRUM AND COCCYX - 2 VIEW COMPARISON:  Hips and pelvis radiograph dated 02/18/2020 FINDINGS: The sacrum and coccyx are partially obscured by overlying bowel contents on the frontal views. Large volume stool throughout the visualized colon. Vascular calcifications. L2 compression deformity, age indeterminate, and grade 1 anterolisthesis at L4-5. Diffuse intervertebral disc space narrowing throughout the lumbar spine. No radiographic finding of acute displaced fracture. Irregular lucency extends through the L4 spinous process. IMPRESSION: 1. L2 compression deformity, age indeterminate, and grade 1 anterolisthesis at L4-5. 2. Diffuse degenerative changes throughout the lumbar spine. 3. Irregular lucency extending through the L4 spinous process on lateral view is nonspecific and may reflect superimposition of structures or nondisplaced fracture lucency if there has been recent trauma. No radiographic finding of acute displaced fracture. Diffuse demineralization limits evaluation for nondisplaced fracture. If there is continued clinical concern recommend further evaluation with cross-sectional imaging. 4. Large volume stool throughout the visualized colon, which obscures the sacrum and coccyx on frontal views. Electronically Signed   By: Darrin Nipper M.D.   On: 05/05/2022 11:24   VAS [place] ABI WITH/WO TBI  Result Date: 05/04/2022  LOWER EXTREMITY DOPPLER STUDY Patient Name:  Cynthia Cook  Date of Exam:   05/04/2022 Medical Rec #: [id number]       Accession #:    [id number] Date of Birth: November 14, 1955        Patient Gender: F Patient Age:   54 years Exam Location:  [facility] Procedure:      VAS [place] ABI WITH/WO TBI Referring Phys: A POWELL JR --------------------------------------------------------------------------------  Indications: Diminished pulses High Risk Factors: No history of smoking, prior CVA. Other Factors: Afib, CKD, CHF.   Limitations: Today's exam was limited due to Persistent Afib. Comparison Study: No previous exams Performing Technologist: Hill, Jody RVT, RDMS  Examination Guidelines: A complete evaluation includes at minimum, Doppler waveform signals and systolic blood pressure reading at the level of bilateral brachial, anterior tibial, and posterior tibial arteries, when vessel segments are accessible. Bilateral testing is considered an integral part of a complete examination. Photoelectric Plethysmograph (PPG) waveforms and toe systolic pressure readings are included as required and additional duplex testing as needed. Limited examinations for reoccurring indications may be performed as noted.  ABI Findings: +---------+------------------+-----+-----------------+-------------------------+ Right    Rt Pressure (mmHg)IndexWaveform         Comment                   +---------+------------------+-----+-----------------+-------------------------+  Brachial 146                    triphasic                                  +---------+------------------+-----+-----------------+-------------------------+ PTA      118               0.81 monophasic                                 +---------+------------------+-----+-----------------+-------------------------+ DP       116               0.79 monophasic                                 +---------+------------------+-----+-----------------+-------------------------+ Great Toe0                 0.00 severely abnormalseverely damped waveform                                                   w/ absent pressure        +---------+------------------+-----+-----------------+-------------------------+ +---------+------------------+-----+----------+-------+ Left     Lt Pressure (mmHg)IndexWaveform  Comment +---------+------------------+-----+----------+-------+ Brachial 135                    triphasic          +---------+------------------+-----+----------+-------+ PTA      112               0.77 monophasic        +---------+------------------+-----+----------+-------+ DP       102               0.70 monophasic        +---------+------------------+-----+----------+-------+ Great Toe0                 0.00 Absent            +---------+------------------+-----+----------+-------+ +-------+-----------+-----------+------------+------------+ ABI/TBIToday's ABIToday's TBIPrevious ABIPrevious TBI +-------+-----------+-----------+------------+------------+ Right  0.81       Absent                              +-------+-----------+-----------+------------+------------+ Left   0.77       Absent                              +-------+-----------+-----------+------------+------------+  Summary: Right: Resting right ankle-brachial index indicates moderate right lower extremity arterial disease. The right toe-brachial index is absent. Left: Resting left ankle-brachial index indicates moderate left lower extremity arterial disease. The left toe-brachial index is absent. *See table(s) above for measurements and observations.  Electronically signed by Monica Martinez MD on 05/04/2022 at 2:27:26 PM.    Final    VAS [place] LOWER EXTREMITY VENOUS (DVT)  Result Date: 04/30/2022  Lower Venous DVT Study Patient Name:  Cynthia Cook  Date of Exam:   04/30/2022 Medical Rec #: [id number]       Accession #:    [id number] Date of Birth: 1955/08/03        Patient Gender:  F Patient Age:   40 years Exam Location:  [facility] Procedure:      VAS [place] LOWER EXTREMITY VENOUS (DVT) Referring Phys: Hopes Plan --------------------------------------------------------------------------------  Indications: Pulmonary embolism.  Comparison Study: no prior Performing Technologist: Archie Patten RVS  Examination Guidelines: A complete evaluation includes B-mode imaging, spectral Doppler, color Doppler, and power Doppler  as needed of all accessible portions of each vessel. Bilateral testing is considered an integral part of a complete examination. Limited examinations for reoccurring indications may be performed as noted. The reflux portion of the exam is performed with the patient in reverse Trendelenburg.  +--------+---------------+---------+-----------+----------+--------------------+ RIGHT   CompressibilityPhasicitySpontaneityPropertiesThrombus Aging       +--------+---------------+---------+-----------+----------+--------------------+ CFV     Full           Yes      Yes                                       +--------+---------------+---------+-----------+----------+--------------------+ SFJ     Full                                                              +--------+---------------+---------+-----------+----------+--------------------+ FV Prox Full                                                              +--------+---------------+---------+-----------+----------+--------------------+ FV Mid  Full                                                              +--------+---------------+---------+-----------+----------+--------------------+ FV      Full                                                              Distal                                                                    +--------+---------------+---------+-----------+----------+--------------------+ PFV     Full                                                              +--------+---------------+---------+-----------+----------+--------------------+ POP     Full  Yes      Yes                                       +--------+---------------+---------+-----------+----------+--------------------+ PTV     Full                                                              +--------+---------------+---------+-----------+----------+--------------------+ PERO                   Yes       Yes                  patent by color                                                           doppler              +--------+---------------+---------+-----------+----------+--------------------+   +---------+---------------+---------+-----------+----------+-------------------+ LEFT     CompressibilityPhasicitySpontaneityPropertiesThrombus Aging      +---------+---------------+---------+-----------+----------+-------------------+ CFV      Full           Yes                                               +---------+---------------+---------+-----------+----------+-------------------+ SFJ      Full                                                             +---------+---------------+---------+-----------+----------+-------------------+ FV Prox  Full                                                             +---------+---------------+---------+-----------+----------+-------------------+ FV Mid   Full                                                             +---------+---------------+---------+-----------+----------+-------------------+ FV DistalFull                                                             +---------+---------------+---------+-----------+----------+-------------------+ PFV      Full                                                             +---------+---------------+---------+-----------+----------+-------------------+  POP      Full           Yes      Yes                                      +---------+---------------+---------+-----------+----------+-------------------+ PTV      Full                                                             +---------+---------------+---------+-----------+----------+-------------------+ PERO                                                  Not well visualized +---------+---------------+---------+-----------+----------+-------------------+     Summary: RIGHT: - There is no  evidence of deep vein thrombosis in the lower extremity.  - No cystic structure found in the popliteal fossa.  LEFT: - There is no evidence of deep vein thrombosis in the lower extremity.  - A cystic structure is found in the popliteal fossa.  *See table(s) above for measurements and observations. Electronically signed by Harold Barban MD on 04/30/2022 at 2:13:52 PM.    Final    ECHOCARDIOGRAM COMPLETE  Result Date: 04/30/2022    ECHOCARDIOGRAM REPORT   Patient Name:   Cynthia Cook Date of Exam: 04/30/2022 Medical Rec #:  [id number]      Height:       66.0 in Accession #:    [id number]     Weight:       188.3 lb Date of Birth:  [birth year]       BSA:          1.949 m Patient Age:    47 years       BP:           118/81 mmHg Patient Gender: F              HR:           103 bpm. Exam Location:  Inpatient Procedure: 2D Echo, Cardiac Doppler and Color Doppler Indications:    Pulmonary embolus  History:        Patient has prior history of Echocardiogram examinations, most                 recent 12/31/2021. CHF; Arrythmias:Atrial Fibrillation. Elevated                 troponin. CKD. Hx CVA.  Sonographer:    Clayton Lefort RDCS (AE) Referring Phys: Eustaquio Boyden A SMITH IMPRESSIONS  1. Left ventricular ejection fraction, by estimation, is 30 to 35%. The left ventricle has moderately decreased function. The left ventricle demonstrates global hypokinesis. There is moderate left ventricular hypertrophy. Left ventricular diastolic parameters are indeterminate.  2. Right ventricular systolic function is moderately reduced. The right ventricular size is mildly enlarged. Tricuspid regurgitation signal is inadequate for assessing PA pressure.  3. Left atrial size was severely dilated.  4. Right atrial size was severely dilated.  5. The mitral valve is degenerative. Mild to moderate mitral valve regurgitation. Moderate mitral stenosis. The mean mitral valve gradient is 8.0 mmHg with  average heart rate of 102 bpm. Severe mitral annular  calcification. MVA 1.5 cm^2 by continuity equation  6. The aortic valve is calcified. There is moderate calcification of the aortic valve. Aortic valve regurgitation is trivial. Moderate aortic valve stenosis. Mild AS by gradients (Vmax 2.1 m/s, MG 13mHg), moderate by AVA (1.2 cm^2) and DI (0.4). Suspect low flow low gradient moderate AS  7. The inferior vena cava is dilated in size with <50% respiratory variability, suggesting right atrial pressure of 15 mmHg. FINDINGS  Left Ventricle: Left ventricular ejection fraction, by estimation, is 30 to 35%. The left ventricle has moderately decreased function. The left ventricle demonstrates global hypokinesis. The left ventricular internal cavity size was small. There is moderate left ventricular hypertrophy. Left ventricular diastolic parameters are indeterminate. Right Ventricle: The right ventricular size is mildly enlarged. Right vetricular wall thickness was not well visualized. Right ventricular systolic function is moderately reduced. Tricuspid regurgitation signal is inadequate for assessing PA pressure. Left Atrium: Left atrial size was severely dilated. Right Atrium: Right atrial size was severely dilated. Pericardium: There is no evidence of pericardial effusion. Mitral Valve: The mitral valve is degenerative in appearance. Severe mitral annular calcification. Mild to moderate mitral valve regurgitation. Moderate mitral valve stenosis. MV peak gradient, 15.4 mmHg. The mean mitral valve gradient is 8.0 mmHg with average heart rate of 102 bpm. Tricuspid Valve: The tricuspid valve is normal in structure. Tricuspid valve regurgitation is trivial. Aortic Valve: The aortic valve is calcified. There is moderate calcification of the aortic valve. Aortic valve regurgitation is trivial. Moderate aortic stenosis is present. Aortic valve mean gradient measures 10.0 mmHg. Aortic valve peak gradient measures 18.0 mmHg. Aortic valve area, by VTI measures 1.24 cm. Pulmonic  Valve: The pulmonic valve was not well visualized. Pulmonic valve regurgitation is trivial. Aorta: The aortic root is normal in size and structure. Venous: The inferior vena cava is dilated in size with less than 50% respiratory variability, suggesting right atrial pressure of 15 mmHg. IAS/Shunts: The interatrial septum was not well visualized.  LEFT VENTRICLE PLAX 2D LVIDd:         3.40 cm LVIDs:         3.00 cm LV PW:         1.30 cm LV IVS:        1.50 cm LVOT diam:     2.00 cm LV SV:         43 LV SV Index:   22 LVOT Area:     3.14 cm  RIGHT VENTRICLE            IVC RV Basal diam:  4.30 cm    IVC diam: 2.30 cm RV Mid diam:    4.00 cm RV S prime:     8.27 cm/s TAPSE (M-mode): 1.0 cm LEFT ATRIUM              Index        RIGHT ATRIUM           Index LA diam:        3.90 cm  2.00 cm/m   RA Area:     29.20 cm LA Vol (A2C):   104.0 ml 53.36 ml/m  RA Volume:   117.00 ml 60.03 ml/m LA Vol (A4C):   91.4 ml  46.89 ml/m LA Biplane Vol: 100.0 ml 51.31 ml/m  AORTIC VALVE AV Area (Vmax):    1.22 cm AV Area (Vmean):   1.28 cm AV Area (VTI):  1.24 cm AV Vmax:           212.00 cm/s AV Vmean:          143.000 cm/s AV VTI:            0.350 m AV Peak Grad:      18.0 mmHg AV Mean Grad:      10.0 mmHg LVOT Vmax:         82.30 cm/s LVOT Vmean:        58.100 cm/s LVOT VTI:          0.138 m LVOT/AV VTI ratio: 0.39  AORTA Ao Root diam: 3.10 cm MITRAL VALVE MV Area VTI:  1.54 cm        SHUNTS MV Peak grad: 15.4 mmHg       Systemic VTI:  0.14 m MV Mean grad: 8.0 mmHg        Systemic Diam: 2.00 cm MV Vmax:      1.96 m/s MV Vmean:     130.0 cm/s MR Peak grad:    104.0 mmHg MR Mean grad:    63.0 mmHg MR Vmax:         510.00 cm/s MR Vmean:        370.0 cm/s MR PISA:         1.57 cm MR PISA Eff ROA: 12 mm MR PISA Radius:  0.50 cm Oswaldo Milian MD Electronically signed by Oswaldo Milian MD Signature Date/Time: 04/30/2022/1:54:53 PM    Final    CT Angio Chest PE W/Cm &/Or Wo Cm  Result Date: 04/29/2022 CLINICAL  DATA:  Suspected pulmonary embolism. EXAM: CT ANGIOGRAPHY CHEST WITH CONTRAST TECHNIQUE: Multidetector CT imaging of the chest was performed using the standard protocol during bolus administration of intravenous contrast. Multiplanar CT image reconstructions and MIPs were obtained to evaluate the vascular anatomy. RADIATION DOSE REDUCTION: This exam was performed according to the departmental dose-optimization program which includes automated exposure control, adjustment of the mA and/or kV according to patient size and/or use of iterative reconstruction technique. CONTRAST:  34m OMNIPAQUE IOHEXOL 350 MG/ML SOLN COMPARISON:  February 27, 2020 FINDINGS: Cardiovascular: There is marked severity calcification of the thoracic aorta. The ascending thoracic aorta measures 4.3 cm in diameter. Marked severity intraluminal low attenuation is seen within the mid and distal portions of the right pulmonary artery involvement of proximal right middle lobe and right lower lobe branches is seen. There is mild cardiomegaly with marked severity coronary artery calcification. Mild right heart strain is seen (RV/LV ratio of 1.14). No pericardial effusion. Mediastinum/Nodes: No enlarged mediastinal, hilar, or axillary lymph nodes. Thyroid gland, trachea, and esophagus demonstrate no significant findings. Lungs/Pleura: Mild left basilar and moderate severity right basilar atelectasis is seen. Small to moderate size bilateral pleural effusions are noted, right slightly greater than left. No pneumothorax is identified. Upper Abdomen: Multiple surgical clips are seen within the gallbladder fossa. Noninflamed diverticula are seen within the visualized portion of the splenic flexure. Musculoskeletal: Multilevel degenerative changes are seen throughout the thoracic spine. Review of the MIP images confirms the above findings. IMPRESSION: 1. Marked severity right-sided pulmonary embolism with mild right heart strain (RV/LV ratio of 1.14). 2.  Mild left basilar and moderate severity right basilar atelectasis. 3. Small to moderate size bilateral pleural effusions, right slightly greater than left. 4. Colonic diverticulosis. 5. Evidence of prior cholecystectomy. 6. Aortic atherosclerosis. Aortic Atherosclerosis (ICD10-I70.0). Electronically Signed   By: TVirgina NorfolkM.D.   On: 04/29/2022 17:43   DG Chest 2  View  Result Date: 04/29/2022 CLINICAL DATA:  Fatigue EXAM: CHEST - 2 VIEW COMPARISON:  03/30/2022 FINDINGS: Small bilateral pleural effusion. No visible edema or air bronchogram. Bulky mitral annular calcification. Normal heart size and stable mediastinal contours. IMPRESSION: Small bilateral pleural effusion. Electronically Signed   By: Jorje Guild M.D.   On: 04/29/2022 07:34   CT HEAD WO CONTRAST (5MM)  Result Date: 04/29/2022 CLINICAL DATA:  Neuro deficit with acute stroke suspected EXAM: CT HEAD WITHOUT CONTRAST TECHNIQUE: Contiguous axial images were obtained from the base of the skull through the vertex without intravenous contrast. RADIATION DOSE REDUCTION: This exam was performed according to the departmental dose-optimization program which includes automated exposure control, adjustment of the mA and/or kV according to patient size and/or use of iterative reconstruction technique. COMPARISON:  Brain MRI 12/30/2021 FINDINGS: Brain: No evidence of acute infarction, hemorrhage, hydrocephalus, or mass lesion/mass effect. Chronic small vessel infarcts in the bilateral cerebellum, bilateral caudate, and left corona radiata. Subdural collections on prior not confidently seen today. Vascular: No hyperdense vessel or unexpected calcification. Skull: Normal. Negative for fracture or focal lesion. Sinuses/Orbits: No acute finding. IMPRESSION: 1. No acute finding. 2. Chronic infra and supratentorial small vessel infarcts. Electronically Signed   By: Jorje Guild M.D.   On: 04/29/2022 07:14     Subjective: Patient was seen and  examined at bedside.  Overnight events noted.   Patient report doing much better.  Patient is being discharged home.  Discharge Exam: Vitals:   05/16/22 0445 05/16/22 0522  BP:  110/72  Pulse:  77  Resp:  20  Temp:  98.3 F (36.8 C)  SpO2: 97% 100%   Vitals:   05/15/22 2055 05/16/22 0445 05/16/22 0522 05/16/22 0623  BP: 110/71  110/72   Pulse: 60  77   Resp: 16  20   Temp: 98.6 F (37 C)  98.3 F (36.8 C)   TempSrc: Oral  Oral   SpO2: 100% 97% 100%   Weight:    72 kg  Height:        General: Pt is alert, awake, not in acute distress Cardiovascular: RRR, S1/S2 +, no rubs, no gallops Respiratory: CTA bilaterally, no wheezing, no rhonchi Abdominal: Soft, NT, ND, bowel sounds + Extremities: no edema, no cyanosis    The results of significant diagnostics from this hospitalization (including imaging, microbiology, ancillary and laboratory) are listed below for reference.     Microbiology: Recent Results (from the past 240 hour(s))  Resp panel by RT-PCR (RSV, Flu A&B, Covid) Anterior Nasal Swab     Status: Abnormal   Collection Time: 05/11/22  4:08 PM   Specimen: Anterior Nasal Swab  Result Value Ref Range Status   SARS Coronavirus 2 by RT PCR NEGATIVE NEGATIVE Final    Comment: (NOTE) SARS-CoV-2 target nucleic acids are NOT DETECTED.  The SARS-CoV-2 RNA is generally detectable in upper respiratory specimens during the acute phase of infection. The lowest concentration of SARS-CoV-2 viral copies this assay can detect is 138 copies/mL. A negative result does not preclude SARS-Cov-2 infection and should not be used as the sole basis for treatment or other patient management decisions. A negative result may occur with  improper specimen collection/handling, submission of specimen other than nasopharyngeal swab, presence of viral mutation(s) within the areas targeted by this assay, and inadequate number of viral copies(<138 copies/mL). A negative result must be combined  with clinical observations, patient history, and epidemiological information. The expected result is Negative.  Fact Sheet for  Patients:  EntrepreneurPulse.com.au  Fact Sheet for Healthcare Providers:  IncredibleEmployment.be  This test is no t yet approved or cleared by the [place] FDA and  has been authorized for detection and/or diagnosis of SARS-CoV-2 by FDA under an Emergency Use Authorization (EUA). This EUA will remain  in effect (meaning this test can be used) for the duration of the COVID-19 declaration under Section 564(b)(1) of the Act, 21 U.S.C.section 360bbb-3(b)(1), unless the authorization is terminated  or revoked sooner.       Influenza A by PCR POSITIVE (A) NEGATIVE Final   Influenza B by PCR NEGATIVE NEGATIVE Final    Comment: (NOTE) The Xpert Xpress SARS-CoV-2/FLU/RSV plus assay is intended as an aid in the diagnosis of influenza from Nasopharyngeal swab specimens and should not be used as a sole basis for treatment. Nasal washings and aspirates are unacceptable for Xpert Xpress SARS-CoV-2/FLU/RSV testing.  Fact Sheet for Patients: EntrepreneurPulse.com.au  Fact Sheet for Healthcare Providers: IncredibleEmployment.be  This test is not yet approved or cleared by the [place] FDA and has been authorized for detection and/or diagnosis of SARS-CoV-2 by FDA under an Emergency Use Authorization (EUA). This EUA will remain in effect (meaning this test can be used) for the duration of the COVID-19 declaration under Section 564(b)(1) of the Act, 21 U.S.C. section 360bbb-3(b)(1), unless the authorization is terminated or revoked.     Resp Syncytial Virus by PCR NEGATIVE NEGATIVE Final    Comment: (NOTE) Fact Sheet for Patients: EntrepreneurPulse.com.au  Fact Sheet for Healthcare Providers: IncredibleEmployment.be  This test is not yet  approved or cleared by the [place] FDA and has been authorized for detection and/or diagnosis of SARS-CoV-2 by FDA under an Emergency Use Authorization (EUA). This EUA will remain in effect (meaning this test can be used) for the duration of the COVID-19 declaration under Section 564(b)(1) of the Act, 21 U.S.C. section 360bbb-3(b)(1), unless the authorization is terminated or revoked.  Performed at [facility], [place] [address], [place], [place] [address]      Labs: BNP (last 3 results) Recent Labs    04/29/22 0639 05/11/22 1506  BNP 580.5* 416.6*   Basic Metabolic Panel: Recent Labs  Lab 05/11/22 1505 05/12/22 0337 05/15/22 1003 05/16/22 0351  NA 140 140 140 140  K 4.6 4.7 3.6 3.9  CL 99 97* 92* 94*  CO2 31 31 39* 34*  GLUCOSE 107* 90 114* 105*  BUN 52* 56* 61* 57*  CREATININE 1.40* 1.56* 1.35* 1.33*  CALCIUM 8.7* 8.7* 8.5* 8.7*  MG  --   --   --  2.2  PHOS  --   --   --  3.3   Liver Function Tests: Recent Labs  Lab 05/11/22 1505 05/12/22 0337  AST 30 24  ALT 35 27  ALKPHOS 75 62  BILITOT 1.4* 1.4*  PROT 7.4 6.3*  ALBUMIN 3.5 3.0*   No results for input(s): "LIPASE", "AMYLASE" in the last 168 hours. No results for input(s): "AMMONIA" in the last 168 hours. CBC: Recent Labs  Lab 05/11/22 1505 05/12/22 0337 05/15/22 1003 05/16/22 0351  WBC 7.0 7.8 13.3* 10.3  NEUTROABS 5.7  --   --   --   HGB 12.3 11.0* 11.5* 11.3*  HCT 40.4 36.7 37.7 37.6  MCV 90.0 88.9 89.8 90.8  PLT 229 205 190 180   Cardiac Enzymes: No results for input(s): "CKTOTAL", "CKMB", "CKMBINDEX", "TROPONINI" in the last 168 hours. BNP: Invalid input(s): "POCBNP" CBG: No results for input(s): "GLUCAP"  in the last 168 hours. D-Dimer No results for input(s): "DDIMER" in the last 72 hours. Hgb A1c No results for input(s): "HGBA1C" in the last 72 hours. Lipid Profile No results for input(s): "CHOL", "HDL", "LDLCALC", "TRIG", "CHOLHDL", "LDLDIRECT" in the last  72 hours. Thyroid function studies No results for input(s): "TSH", "T4TOTAL", "T3FREE", "THYROIDAB" in the last 72 hours.  Invalid input(s): "FREET3" Anemia work up No results for input(s): "VITAMINB12", "FOLATE", "FERRITIN", "TIBC", "IRON", "RETICCTPCT" in the last 72 hours. Urinalysis    Component Value Date/Time   COLORURINE YELLOW 04/29/2022 1949   APPEARANCEUR HAZY (A) 04/29/2022 1949   LABSPEC 1.015 04/29/2022 1949   PHURINE 5.0 04/29/2022 1949   GLUCOSEU NEGATIVE 04/29/2022 1949   HGBUR NEGATIVE 04/29/2022 1949   BILIRUBINUR NEGATIVE 04/29/2022 1949   KETONESUR NEGATIVE 04/29/2022 1949   PROTEINUR NEGATIVE 04/29/2022 1949   NITRITE NEGATIVE 04/29/2022 1949   LEUKOCYTESUR SMALL (A) 04/29/2022 1949   Sepsis Labs Recent Labs  Lab 05/11/22 1505 05/12/22 0337 05/15/22 1003 05/16/22 0351  WBC 7.0 7.8 13.3* 10.3   Microbiology Recent Results (from the past 240 hour(s))  Resp panel by RT-PCR (RSV, Flu A&B, Covid) Anterior Nasal Swab     Status: Abnormal   Collection Time: 05/11/22  4:08 PM   Specimen: Anterior Nasal Swab  Result Value Ref Range Status   SARS Coronavirus 2 by RT PCR NEGATIVE NEGATIVE Final    Comment: (NOTE) SARS-CoV-2 target nucleic acids are NOT DETECTED.  The SARS-CoV-2 RNA is generally detectable in upper respiratory specimens during the acute phase of infection. The lowest concentration of SARS-CoV-2 viral copies this assay can detect is 138 copies/mL. A negative result does not preclude SARS-Cov-2 infection and should not be used as the sole basis for treatment or other patient management decisions. A negative result may occur with  improper specimen collection/handling, submission of specimen other than nasopharyngeal swab, presence of viral mutation(s) within the areas targeted by this assay, and inadequate number of viral copies(<138 copies/mL). A negative result must be combined with clinical observations, patient history, and  epidemiological information. The expected result is Negative.  Fact Sheet for Patients:  EntrepreneurPulse.com.au  Fact Sheet for Healthcare Providers:  IncredibleEmployment.be  This test is no t yet approved or cleared by the [place] FDA and  has been authorized for detection and/or diagnosis of SARS-CoV-2 by FDA under an Emergency Use Authorization (EUA). This EUA will remain  in effect (meaning this test can be used) for the duration of the COVID-19 declaration under Section 564(b)(1) of the Act, 21 U.S.C.section 360bbb-3(b)(1), unless the authorization is terminated  or revoked sooner.       Influenza A by PCR POSITIVE (A) NEGATIVE Final   Influenza B by PCR NEGATIVE NEGATIVE Final    Comment: (NOTE) The Xpert Xpress SARS-CoV-2/FLU/RSV plus assay is intended as an aid in the diagnosis of influenza from Nasopharyngeal swab specimens and should not be used as a sole basis for treatment. Nasal washings and aspirates are unacceptable for Xpert Xpress SARS-CoV-2/FLU/RSV testing.  Fact Sheet for Patients: EntrepreneurPulse.com.au  Fact Sheet for Healthcare Providers: IncredibleEmployment.be  This test is not yet approved or cleared by the [place] FDA and has been authorized for detection and/or diagnosis of SARS-CoV-2 by FDA under an Emergency Use Authorization (EUA). This EUA will remain in effect (meaning this test can be used) for the duration of the COVID-19 declaration under Section 564(b)(1) of the Act, 21 U.S.C. section 360bbb-3(b)(1), unless the authorization is terminated or revoked.  Resp Syncytial Virus by PCR NEGATIVE NEGATIVE Final    Comment: (NOTE) Fact Sheet for Patients: EntrepreneurPulse.com.au  Fact Sheet for Healthcare Providers: IncredibleEmployment.be  This test is not yet approved or cleared by the [place] FDA and has  been authorized for detection and/or diagnosis of SARS-CoV-2 by FDA under an Emergency Use Authorization (EUA). This EUA will remain in effect (meaning this test can be used) for the duration of the COVID-19 declaration under Section 564(b)(1) of the Act, 21 U.S.C. section 360bbb-3(b)(1), unless the authorization is terminated or revoked.  Performed at [facility], [place] [address] [place], [place] [address]      Time coordinating discharge: Over 30 minutes  SIGNED:   Shawna Clamp, MD  [facility] 05/16/2022, 12:22 PM Pager   If 7PM-7AM, please contact night-coverage

## 2022-05-16 NOTE — Progress Notes (Signed)
Pt discharged to home. Prior to DC, IV and tele was removed. Pt was given dc paperwork regarding medications, condition, and follow up appointments. Pt verbalized understanding and stated no other concerns at the time. Pt stable at time of DC and left in ride safe van.

## 2022-05-16 NOTE — TOC Transition Note (Addendum)
Transition of Care [facility]) - CM/SW Discharge Note   Patient Details  Name: Cynthia Cook MRN: [id number] Date of Birth: 23-Dec-1955  [facility]) CM/SW Contact:  Illene Regulus, LCSW Phone Number: 05/16/2022, 10:20 AM   Clinical Narrative:     CSW spoke spoke to cheryl with [facility], they are currently following pt. No additional TOC needs . TOC sign off.   Adden 11:00am Pt requesting wheelchair and transportation assistance. Pt has not preference , CSW sent referral Jermiane with [employer]. Will need DME orders. MD and RN made aware.   3:32pm CSW confirmed pt received wheelchair. CSW arranged safe transport for door to door pick up. They will be at main entrance in 33mns. MD and RN made aware. TOC sign off  Barriers to Discharge: ED DME delivery   Patient Goals and CMS Choice CMS Medicare.gov Compare Post Acute Care list provided to:: Patient Choice offered to / list presented to : Patient  Discharge Placement                         Discharge Plan and Services Additional resources added to the After Visit Summary for                            [facility], [facility]: Other - See comment ([place] Date HH Agency Contacted: 05/16/22 Time [place] 161Representative spoke with at HFort[place] AG[place] [facility] [place] No Food Insecurity (05/11/2022)  Housing: Low Risk  (05/11/2022)  Transportation Needs: No Transportation Needs (05/11/2022)  Utilities: Not At Risk (05/11/2022)  Alcohol Screen: Low Risk  (05/04/2022)  Depression (PHQ2-9): Medium Risk (02/18/2021)  Financial Resource Strain: Low Risk  (05/04/2022)  Stress: Stress Concern Present (06/14/2021)  Tobacco Use: Low Risk  (05/12/2022)     Readmission Risk Interventions     No data to display

## 2022-05-16 NOTE — Progress Notes (Signed)
Orthopedic Tech Progress Note Patient Details:  Cynthia Cook Nov 04, 1955 [id number]  Patient ID: Cynthia Cook, female   DOB: February 28, 1956, 67 y.o.   MRN: [id number]  Cynthia Cook 05/16/2022, 3:22 PM [place] applied

## 2022-05-17 DIAGNOSIS — Z8673 Personal history of transient ischemic attack (TIA), and cerebral infarction without residual deficits: Secondary | ICD-10-CM | POA: Diagnosis not present

## 2022-05-17 DIAGNOSIS — Z86711 Personal history of pulmonary embolism: Secondary | ICD-10-CM | POA: Diagnosis not present

## 2022-05-17 DIAGNOSIS — D631 Anemia in chronic kidney disease: Secondary | ICD-10-CM | POA: Diagnosis not present

## 2022-05-17 DIAGNOSIS — N1832 Chronic kidney disease, stage 3b: Secondary | ICD-10-CM | POA: Diagnosis not present

## 2022-05-17 DIAGNOSIS — M109 Gout, unspecified: Secondary | ICD-10-CM | POA: Diagnosis not present

## 2022-05-17 DIAGNOSIS — M47816 Spondylosis without myelopathy or radiculopathy, lumbar region: Secondary | ICD-10-CM | POA: Diagnosis not present

## 2022-05-17 DIAGNOSIS — I5043 Acute on chronic combined systolic (congestive) and diastolic (congestive) heart failure: Secondary | ICD-10-CM | POA: Diagnosis not present

## 2022-05-17 DIAGNOSIS — M4856XD Collapsed vertebra, not elsewhere classified, lumbar region, subsequent encounter for fracture with routine healing: Secondary | ICD-10-CM | POA: Diagnosis not present

## 2022-05-17 DIAGNOSIS — I739 Peripheral vascular disease, unspecified: Secondary | ICD-10-CM | POA: Diagnosis not present

## 2022-05-17 DIAGNOSIS — E785 Hyperlipidemia, unspecified: Secondary | ICD-10-CM | POA: Diagnosis not present

## 2022-05-17 DIAGNOSIS — J101 Influenza due to other identified influenza virus with other respiratory manifestations: Secondary | ICD-10-CM | POA: Diagnosis not present

## 2022-05-17 DIAGNOSIS — E039 Hypothyroidism, unspecified: Secondary | ICD-10-CM | POA: Diagnosis not present

## 2022-05-17 DIAGNOSIS — Z9181 History of falling: Secondary | ICD-10-CM | POA: Diagnosis not present

## 2022-05-17 DIAGNOSIS — Z7901 Long term (current) use of anticoagulants: Secondary | ICD-10-CM | POA: Diagnosis not present

## 2022-05-17 DIAGNOSIS — G8929 Other chronic pain: Secondary | ICD-10-CM | POA: Diagnosis not present

## 2022-05-17 DIAGNOSIS — I480 Paroxysmal atrial fibrillation: Secondary | ICD-10-CM | POA: Diagnosis not present

## 2022-05-17 DIAGNOSIS — M5126 Other intervertebral disc displacement, lumbar region: Secondary | ICD-10-CM | POA: Diagnosis not present

## 2022-05-17 DIAGNOSIS — I080 Rheumatic disorders of both mitral and aortic valves: Secondary | ICD-10-CM | POA: Diagnosis not present

## 2022-05-18 ENCOUNTER — Ambulatory Visit (HOSPITAL_BASED_OUTPATIENT_CLINIC_OR_DEPARTMENT_OTHER): Payer: Medicare HMO | Admitting: Family

## 2022-05-18 ENCOUNTER — Encounter (HOSPITAL_BASED_OUTPATIENT_CLINIC_OR_DEPARTMENT_OTHER): Payer: Self-pay | Admitting: Family

## 2022-05-18 VITALS — BP 112/70 | HR 103 | Ht 66.0 in | Wt 161.5 lb

## 2022-05-18 DIAGNOSIS — I700 Atherosclerosis of aorta: Secondary | ICD-10-CM

## 2022-05-18 DIAGNOSIS — I5022 Chronic systolic (congestive) heart failure: Secondary | ICD-10-CM | POA: Diagnosis not present

## 2022-05-18 DIAGNOSIS — Z8673 Personal history of transient ischemic attack (TIA), and cerebral infarction without residual deficits: Secondary | ICD-10-CM | POA: Diagnosis not present

## 2022-05-18 DIAGNOSIS — E785 Hyperlipidemia, unspecified: Secondary | ICD-10-CM | POA: Diagnosis not present

## 2022-05-18 DIAGNOSIS — Z86711 Personal history of pulmonary embolism: Secondary | ICD-10-CM | POA: Diagnosis not present

## 2022-05-18 DIAGNOSIS — I480 Paroxysmal atrial fibrillation: Secondary | ICD-10-CM

## 2022-05-18 DIAGNOSIS — I5020 Unspecified systolic (congestive) heart failure: Secondary | ICD-10-CM | POA: Diagnosis not present

## 2022-05-18 DIAGNOSIS — D6859 Other primary thrombophilia: Secondary | ICD-10-CM

## 2022-05-18 DIAGNOSIS — I350 Nonrheumatic aortic (valve) stenosis: Secondary | ICD-10-CM | POA: Diagnosis not present

## 2022-05-18 DIAGNOSIS — R052 Subacute cough: Secondary | ICD-10-CM | POA: Diagnosis not present

## 2022-05-18 DIAGNOSIS — I6523 Occlusion and stenosis of bilateral carotid arteries: Secondary | ICD-10-CM

## 2022-05-18 MED ORDER — BENZONATATE 100 MG PO CAPS: 100.0000 mg | ORAL_CAPSULE | Freq: Three times a day (TID) | ORAL | 1 refills | Status: AC | PRN

## 2022-05-18 MED ORDER — APIXABAN 5 MG PO TABS: 5.0000 mg | ORAL_TABLET | Freq: Two times a day (BID) | ORAL | 5 refills | Status: AC

## 2022-05-18 MED ORDER — BISOPROLOL FUMARATE 5 MG PO TABS: 5.0000 mg | ORAL_TABLET | Freq: Every day | ORAL | 5 refills | Status: AC

## 2022-05-18 NOTE — Patient Instructions (Addendum)
Medication Instructions:  Your physician has recommended you make the following change in your medication:   Resume: Eliquis and Bisoprolol  Start: Tessalon Pearls three times daily as needed for cough  We have given you two weeks of samples of Eliquis today!    *If you need a refill on your cardiac medications before your next appointment, please call your pharmacy*  Follow-Up: At [facility], you and your health needs are our priority.  As part of our continuing mission to provide you with exceptional heart care, we have created designated Provider Care Teams.  These Care Teams include your primary Cardiologist (physician) and Advanced Practice Providers (APPs -  Physician Assistants and Nurse Practitioners) who all work together to provide you with the care you need, when you need it.  We recommend signing up for the patient portal called "MyChart".  Sign up information is provided on this After Visit Summary.  MyChart is used to connect with patients for Virtual Visits (Telemedicine).  Patients are able to view lab/test results, encounter notes, upcoming appointments, etc.  Non-urgent messages can be sent to your provider as well.   To learn more about what you can do with MyChart, go to [email or web address].    Your next appointment:   4-6 week(s)  Provider:   Buford Dresser, MD    Other Instructions Heart Healthy Diet Recommendations: A low-salt diet is recommended. Meats should be grilled, baked, or boiled. Avoid fried foods. Focus on lean protein sources like fish or chicken with vegetables and fruits. The American Heart Association is a Microbiologist!  American Heart Association Diet and Lifeystyle Recommendations   Exercise recommendations: The American Heart Association recommends 150 minutes of moderate intensity exercise weekly. Try 30 minutes of moderate intensity exercise 4-5 times per week. This could include walking, jogging, or swimming.

## 2022-05-18 NOTE — Progress Notes (Signed)
2 weeks Eliquis given today- 2 box  LOT # [id number] EXP 6/25

## 2022-05-18 NOTE — Progress Notes (Signed)
Office Visit    Patient Name: Cynthia Cook Date of Encounter: 05/18/2022  PCP:  Lurline Del, [facility]  Cardiologist:  Buford Dresser, MD  Advanced Practice Provider:  No care team member to display Electrophysiologist:  None      Chief Complaint    Cynthia Cook is a 67 y.o. female presents today for hospital follow up   Past Medical History    Past Medical History:  Diagnosis Date   Back pain    Gout    Murmur    PE (pulmonary embolism)    Persistent atrial fibrillation [facility])    Pulmonary emboli ([place]) 02/05/2020   Syncope    Thyroid disease    History reviewed. No pertinent surgical history.  Allergies  Allergies  Allergen Reactions   Codeine Nausea Only   Duragesic-100 [Fentanyl] Nausea And Vomiting    Dizziness Lightheadedness    Mucinex [Guaifenesin Er] Other (See Comments)    Syncope    Nsaids Other (See Comments)    Kidney disease    History of Present Illness    Cynthia Cook is a 67 y.o. female with a hx of paroxysmal atrial fibrillation, PAD, pulmonary embolism, CVA, carotid stenosis, subdural hematoma, hypothyroidism, chronic back pain, HFrEF, PVD, gout, CKD 3B last seen while hospitalized.  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 Underwent cardioversion 02/27/20 after presenting to ED with atrial fib with RVR. She declined [place]. Later admitted 12/2020 due to acute CVA - MRI brain with subacute infarct in left centrum semiovale.  Carotid duplex 01/04/21 with bilateral 1-39% stenosis.  Echo with bubble 01/04/2021 with LVEF 55 to 60%, no R WMA, grade 1 diastolic dysfunction, RV normal size and function, left atrium moderately dilated, moderate MR, mild to moderate mitral stenosis, trivial AI, mild to moderate aortic stenosis, mild dilation of ascending aorta 41 mmHg.  There was no evidence of interatrial shunt, bubble study negative. At subsequent office  visits she declined anticoagulation due to concerns about long term use and cost. Social work was consulted for financial stressors. Last seen in clinic 04/2021 and subsequently lost to follow up.   Admitted 9/22-9/23/23 with CVA after presenting with dysarthria and left facial droop. MRI small infarct of right frontal corona radiator. Carotid doppler bilateral 1-39% stenosis. Echo with LVEF 55-60%.  Noted 1/7 - 05/05/2022 with acute submassive PE and heart failure with LVEF 30-35%. ABI with bilateral moderate PAD. She was discharged on Bisoprolol. Jardiance on discharge due to renal function. She was discharged to rehab. Readmitted 2/1 - 05/16/2022 with CHF exacerbation.  Respiratory panel influenza A positive-treated with Tamiflu and continued on IV Lasix.   Echo 05/14/22 LVEF 35-40%, LV global hypokinesis, moderate LVH, LA severely dilated, moderate MR, mild to moderate MS, moderate low flow AS, mild dilation ascending aorta 57m. Diuresed 8 pounds.  She presents today with a friend from church. Feeling overall okay since discharge just a couple days ago. Unfortunately, she was not discharged with prescriptions for her Eliquis or Bisoprolol and has not been able to take. Notes palpitations associated with her atrial fibrillation only intermittently. Most bothered by congested cough since discharge. Sputum was been white and sticky. Notes she cannot take guafenesin as it makes her pass out. No chest pain, pressure, tightness. Exertional dyspnea is stable and she is hopeful to increase her stamina by working with home health. Notes she is having difficulty even having strength to ambulate  around her own apartment. Does not feel cholesterol is high enough to warrant Zetia. She has previously declined statins.   EKGs/Labs/Other Studies Reviewed:   The following studies were reviewed today: Echo   2/2/ 2024   1. Left ventricular ejection fraction, by estimation, is 35 to 40%. The  left ventricle has moderately  decreased function. The left ventricle  demonstrates global hypokinesis. There is moderate concentric left  ventricular hypertrophy. Left ventricular  diastolic parameters are indeterminate.   2. Right ventricular systolic function was not well visualized. The right  ventricular size is not well visualized.   3. Left atrial size was severely dilated.   4. MVA by deceleration time not likely accurate mean gradient 6 peak 11  mmHg at HR of 102 bpm . The mitral valve is abnormal. Moderate mitral  valve regurgitation. Mild to moderate mitral stenosis. The mean mitral  valve gradient is 5.7 mmHg. Moderate  mitral annular calcification.   5. Mean gradient 10 peak 18 mmHg on TTE done 05/03/22 Poor CW  interrogation likely moderate low flow AS . The aortic valve is tricuspid.  There is moderate calcification of the aortic valve. Aortic valve  regurgitation is mild. Mild aortic valve stenosis.   6. Aortic dilatation noted and Normal Ascending Aorta. There is mild  dilatation of the ascending aorta, measuring 41 mm.      Echo Sep 2023   1. Left ventricular ejection fraction, by estimation, is 55 to 60%. The  left ventricle has normal function. The left ventricle has no regional  wall motion abnormalities. There is mild left ventricular hypertrophy.  Left ventricular diastolic parameters  are indeterminate.   2. Right ventricular systolic function is normal. The right ventricular  size is normal. There is normal pulmonary artery systolic pressure.   3. Left atrial size was moderately dilated.   4. Right atrial size was moderately dilated.   5. Severe bulky posterior annular calcification mean diastolic gradient 5  mmHg but MVA estimated at > 2 cm2 . The mitral valve is degenerative.  Moderate mitral valve regurgitation. No evidence of mitral stenosis.  Severe mitral annular calcification.   6. The aortic valve is tricuspid. There is moderate calcification of the  aortic valve. There is  moderate thickening of the aortic valve. Aortic  valve regurgitation is mild. Mild aortic valve stenosis.   7. Aortic dilatation noted. There is moderate dilatation of the ascending  aorta, measuring 42 mm.   8. The inferior vena cava is dilated in size with <50% respiratory  variability, suggesting right atrial pressure of 15 mmHg.   EKG:  EKG is ordered today.  The ekg ordered today demonstrates atrial fibrillation 103 bpm with no acute ST/T wave changes.   Recent Labs: 04/29/2022: TSH 4.812 05/11/2022: B Natriuretic Peptide 790.4 05/12/2022: ALT 27 05/16/2022: BUN 57; Creatinine, Ser 1.33; Hemoglobin 11.3; Magnesium 2.2; Platelets 180; Potassium 3.9; Sodium 140  Recent Lipid Panel    Component Value Date/Time   CHOL 187 12/31/2021 0615   TRIG 73 12/31/2021 0615   HDL 45 12/31/2021 0615   CHOLHDL 4.2 12/31/2021 0615   VLDL 15 12/31/2021 0615   LDLCALC 127 (H) 12/31/2021 0615    Risk Assessment/Calculations:   CHA2DS2-VASc Score = 7   This indicates a 11.2% annual risk of stroke. The patient's score is based upon: CHF History: 1 HTN History: 1 Diabetes History: 0 Stroke History: 2 Vascular Disease History: 1 Age Score: 1 Gender Score: 1     Home Medications  Current Meds  Medication Sig   furosemide (LASIX) 40 MG tablet Take 1 tablet (40 mg total) by mouth daily.   levothyroxine (SYNTHROID) 50 MCG tablet Take 1 tablet (50 mcg) by mouth on Monday, Wednesday, and Friday (Patient taking differently: Take 50 mcg by mouth See admin instructions. 50 mcg once daily on Monday, Wednesday, Friday.)   levothyroxine (SYNTHROID) 75 MCG tablet Take 1 tablet (75 mcg) by mouth on Tuesday, Thursday, Saturday, and Sunday (Patient taking differently: Take 75 mcg by mouth See admin instructions. 75 mcg once daily on Sunday, Tuesday, Thursday, Saturday.)   senna (SENOKOT) 8.6 MG tablet Take 1 tablet by mouth daily.   vitamin E 180 MG (400 UNITS) capsule Take 400 Units by mouth daily.      Review of Systems      All other systems reviewed and are otherwise negative except as noted above.  Physical Exam    VS:  BP 112/70 (BP Location: Left Arm, Patient Position: Sitting, Cuff Size: Normal)   Pulse (!) 103   Ht '5\' 6"'$  (1.676 m)   Wt 161 lb 8 oz (73.3 kg)   BMI 26.07 kg/m  , BMI Body mass index is 26.07 kg/m.  Wt Readings from Last 3 Encounters:  05/18/22 161 lb 8 oz (73.3 kg)  05/16/22 158 lb 11.7 oz (72 kg)  05/05/22 177 lb 6.4 oz (80.5 kg)     GEN: Well nourished, well developed, in no acute distress. HEENT: normal. Neck: Supple, no JVD, carotid bruits, or masses. Cardiac: IRIR, tachycardic, no murmurs, rubs, or gallops. No clubbing, cyanosis, edema.  Radials/PT 2+ and equal bilaterally.  Respiratory:  Respirations regular and unlabored, clear to auscultation bilaterally. GI: Soft, nontender, nondistended. MS: No deformity or atrophy. Skin: Warm and dry, no rash. Neuro:  Strength and sensation are intact. Psych: Normal affect.  Assessment & Plan    Cough - Residual after recent diagnosis of flu. Tessalon pearles PRN provided.   Combined systolic and diastolic heart failure- Euvolemic and well compensated on exam. GDMT limited by relative hypotension. Continue Lasix '40mg'$  QD. Encouraged to weigh daily and contact us for weight gain of 2 pounds overnight or 5 pounds in one week. Could consider resuming Jardiance at follow up or addition of low dose ARB.   PAF/hypercoagulable state- EKG today atrial fib 103 bpm. She unfortunately was discharged 05/16/22 without prescription for her Bisoprolol or Eliquis. Will refill today.2 weeks of Eliquis samples provided in clinic. Plan for CBC/BMP at next follow up visit for monitoring.   History of CVA- No aspirin due to [facility]. Lipid management, as below.   HLD / Aortic atherosclerosis - Previously has declined statin. Discussed Zetia today. She does not feel her cholesterol is high enough to require intervention at this time.  12/31/21 LDL 127. LDL goal <70 given prior CVA and aortic atherosclerosis. Will continue to discuss at follow up visits.   PAD - No claudication symptoms. She is working with [facility] PT, walking encouraged.  Carotid stenosis - 12/31/21 bilateral 1-39% stenosis. Consider repeat duplex in one year for monitoring.   Moderate MR / Mild to moderate MS / moderate low flow AS / mild dilation ascending aorta - Prefers medical management but would be open to invasive procedures in the future if needed.  Continue to monitor with periodic echocardiogram.  Volume management, as above.  History of PE-diagnosed 04/16/2022. Continue Eliquis '5mg'$  BID. Refills and 2 weeks of samples provided today. Given history of prior PE and multiple CVA would  recommend indefinite anticoagulation.  CKDIIIb - Careful titration of diuretic and antihypertensive.    Hypothyroidism - Continue to follow with PCP. She is considering establishing with a PCP who will travel to her home.       Disposition: Follow up  in 4-6 weeks  with Buford Dresser, MD or APP.  Signed, Loel Dubonnet, NP 05/18/2022, 2:25 PM [place]

## 2022-05-22 ENCOUNTER — Encounter (HOSPITAL_COMMUNITY): Payer: Medicare HMO

## 2022-05-24 ENCOUNTER — Telehealth: Payer: Medicare HMO

## 2022-05-24 DIAGNOSIS — L89159 Pressure ulcer of sacral region, unspecified stage: Secondary | ICD-10-CM | POA: Diagnosis not present

## 2022-05-24 DIAGNOSIS — Z09 Encounter for follow-up examination after completed treatment for conditions other than malignant neoplasm: Secondary | ICD-10-CM | POA: Diagnosis not present

## 2022-05-24 DIAGNOSIS — R5381 Other malaise: Secondary | ICD-10-CM | POA: Diagnosis not present

## 2022-05-24 DIAGNOSIS — I5022 Chronic systolic (congestive) heart failure: Secondary | ICD-10-CM | POA: Diagnosis not present

## 2022-05-24 DIAGNOSIS — R944 Abnormal results of kidney function studies: Secondary | ICD-10-CM | POA: Diagnosis not present

## 2022-05-24 DIAGNOSIS — R609 Edema, unspecified: Secondary | ICD-10-CM | POA: Diagnosis not present

## 2022-05-24 DIAGNOSIS — R0602 Shortness of breath: Secondary | ICD-10-CM | POA: Diagnosis not present

## 2022-05-26 ENCOUNTER — Telehealth: Payer: Medicare HMO | Admitting: Family Medicine

## 2022-05-26 DIAGNOSIS — K047 Periapical abscess without sinus: Secondary | ICD-10-CM | POA: Diagnosis not present

## 2022-05-26 MED ORDER — CLINDAMYCIN HCL 300 MG PO CAPS: 300.0000 mg | ORAL_CAPSULE | Freq: Three times a day (TID) | ORAL | 0 refills | Status: AC

## 2022-05-26 NOTE — Progress Notes (Signed)
Virtual Visit Consent   Cynthia Cook, you are scheduled for a virtual visit with a [facility] provider today. Just as with appointments in the office, your consent must be obtained to participate. Your consent will be active for this visit and any virtual visit you may have with one of our providers in the next 365 days. If you have a MyChart account, a copy of this consent can be sent to you electronically.  As this is a virtual visit, video technology does not allow for your provider to perform a traditional examination. This may limit your provider's ability to fully assess your condition. If your provider identifies any concerns that need to be evaluated in person or the need to arrange testing (such as labs, EKG, etc.), we will make arrangements to do so. Although advances in technology are sophisticated, we cannot ensure that it will always work on either your end or our end. If the connection with a video visit is poor, the visit may have to be switched to a telephone visit. With either a video or telephone visit, we are not always able to ensure that we have a secure connection.  By engaging in this virtual visit, you consent to the provision of healthcare and authorize for your insurance to be billed (if applicable) for the services provided during this visit. Depending on your insurance coverage, you may receive a charge related to this service.  I need to obtain your verbal consent now. Are you willing to proceed with your visit today? Cynthia Cook has provided verbal consent on 05/26/2022 for a virtual visit (video or telephone). [name], FNP  Date: 05/26/2022 10:56 AM  Virtual Visit via Video Note   I, [name], connected with  Cynthia Cook  ([id number], 03-09-1956) on 05/26/22 at 10:45 AM EST by a video-enabled telemedicine application and verified that I am speaking with the correct person using two identifiers.  Location: Patient: Virtual Visit Location Patient:  Home Provider: Virtual Visit Location Provider: Home Office   I discussed the limitations of evaluation and management by telemedicine and the availability of in person appointments. The patient expressed understanding and agreed to proceed.    History of Present Illness: Cynthia Cook is a 67 y.o. who identifies as a female who was assigned female at birth, and is being seen today for a dental abscess that resolved previously then she was hospitalized and it has returned. She had a refill on the augmentin and is taking that but sx are persisting. She complains of left sided dental pain. She has seen her pcp but has not made apptmt with a dentist. No fever.   HPI: HPI  Problems:  Patient Active Problem List   Diagnosis Date Noted   HFrEF (heart failure with reduced ejection fraction) ([place]) 05/13/2022   CHF exacerbation ([place]) 05/11/2022   Acute on chronic combined systolic and diastolic CHF (congestive heart failure) ([place]) 05/02/2022   Paroxysmal atrial fibrillation with RVR ([place]) 05/02/2022   Gout flare 05/02/2022   CHF (congestive heart failure) ([place]) 04/29/2022   History of CVA (cerebrovascular accident) 04/29/2022   History of pulmonary embolism 04/29/2022   Lumbar compression fracture ([place]) 04/29/2022   Normocytic anemia 04/29/2022   Excessive daytime sleepiness 02/10/2022   Elevated blood pressure reading 02/10/2022   Subdural hematoma (HCC)    CVA (cerebral vascular accident) ([place]) 12/30/2021   Elevated blood pressure reading with diagnosis of hypertension 10/26/2021   Urine output low 10/26/2021   Reactive depression (situational) 10/26/2021  Eczema intertrigo 06/28/2021   Varicose veins of both lower extremities 06/28/2021   Financial insecurity 06/28/2021   Encounter for medical examination to establish care 02/17/2021   Stroke ([place]) 01/04/2021   Acute CVA (cerebrovascular accident) ([place]) 01/03/2021   CKD (chronic kidney disease) stage 3, GFR 30-59 ml/min (HCC) -  baseline SCr 1.3 01/03/2021   Hashimoto's thyroiditis 06/29/2020   Elevated troponin I level    Atrial fibrillation ([place]) 02/17/2020   Hypothyroidism 02/06/2020   Pulmonary embolism ([place]) 02/06/2020    Allergies:  Allergies  Allergen Reactions   Codeine Nausea Only   Duragesic-100 [Fentanyl] Nausea And Vomiting    Dizziness Lightheadedness    Mucinex [Guaifenesin Er] Other (See Comments)    Syncope    Nsaids Other (See Comments)    Kidney disease   Medications:  Current Outpatient Medications:    clindamycin (CLEOCIN) 300 MG capsule, Take 1 capsule (300 mg total) by mouth 3 (three) times daily for 10 days., Disp: 30 capsule, Rfl: 0   apixaban (ELIQUIS) 5 MG TABS tablet, Take 1 tablet (5 mg total) by mouth 2 (two) times daily., Disp: 60 tablet, Rfl: 5   Ascorbic Acid (VITAMIN C) 1000 MG tablet, Take 4,000 mg by mouth daily., Disp: , Rfl:    b complex vitamins capsule, Take 1 capsule by mouth daily., Disp: , Rfl:    benzonatate (TESSALON PERLES) 100 MG capsule, Take 1 capsule (100 mg total) by mouth 3 (three) times daily as needed for cough (Take one tablet for cough, if one tablet does not work well can take a second tablet.)., Disp: 30 capsule, Rfl: 1   bisoprolol (ZEBETA) 5 MG tablet, Take 1 tablet (5 mg total) by mouth daily., Disp: 30 tablet, Rfl: 5   furosemide (LASIX) 40 MG tablet, Take 1 tablet (40 mg total) by mouth daily., Disp: 30 tablet, Rfl: 2   levothyroxine (SYNTHROID) 50 MCG tablet, Take 1 tablet (50 mcg) by mouth on Monday, Wednesday, and Friday (Patient taking differently: Take 50 mcg by mouth See admin instructions. 50 mcg once daily on Monday, Wednesday, Friday.), Disp: 45 tablet, Rfl: 11   levothyroxine (SYNTHROID) 75 MCG tablet, Take 1 tablet (75 mcg) by mouth on Tuesday, Thursday, Saturday, and Sunday (Patient taking differently: Take 75 mcg by mouth See admin instructions. 75 mcg once daily on Sunday, Tuesday, Thursday, Saturday.), Disp: 45 tablet, Rfl: 11   OVER  THE COUNTER MEDICATION, Take 1 tablet by mouth daily with supper. Calcium + D3 + K2, Disp: , Rfl:    senna (SENOKOT) 8.6 MG tablet, Take 1 tablet by mouth daily., Disp: , Rfl:    vitamin E 180 MG (400 UNITS) capsule, Take 400 Units by mouth daily., Disp: , Rfl:   Observations/Objective: Patient is well-developed, well-nourished in no acute distress.  Resting comfortably  at home.  Head is normocephalic, atraumatic.  No labored breathing.  Speech is clear and coherent with logical content.  Patient is alert and oriented at baseline.    Assessment and Plan: 1. Dental abscess  Warm salt water rinses. I have informed her she will need to follow up with pcp., UC or dentist for further treatment as this is the extent to which we can treat through this venue.   Follow Up Instructions: I discussed the assessment and treatment plan with the patient. The patient was provided an opportunity to ask questions and all were answered. The patient agreed with the plan and demonstrated an understanding of the instructions.  A copy of  instructions were sent to the patient via MyChart unless otherwise noted below.     The patient was advised to call back or seek an in-person evaluation if the symptoms worsen or if the condition fails to improve as anticipated.  Time:  I spent 10 minutes with the patient via telehealth technology discussing the above problems/concerns.    [name], FNP

## 2022-05-26 NOTE — Patient Instructions (Signed)
Dental Abscess  A dental abscess is an infection around a tooth that may involve pain, swelling, and a collection of pus, as well as other symptoms. Treatment is important to help with symptoms and to prevent the infection from spreading. The general types of dental abscesses are: Pulpal abscess. This abscess may form from the inner part of the tooth (pulp). Periodontal abscess. This abscess may form from the gum. What are the causes? This condition is caused by a bacterial infection in or around the tooth. It may result from: Severe tooth decay (cavities). Trauma to the tooth, such as a broken or chipped tooth. What increases the risk? This condition is more likely to develop in males. It is also more likely to develop in people who: Have cavities. Have severe gum disease. Eat sugary snacks between meals. Use tobacco products. Have diabetes. Have a weakened disease-fighting system (immune system). Do not brush and care for their teeth regularly. What are the signs or symptoms? Mild symptoms of this condition include: Tenderness. Bad breath. Fever. A bitter taste in the mouth. Pain in and around the infected tooth. Moderate symptoms of this condition include: Swollen neck glands. Chills. Pus drainage. Swelling and redness around the infected tooth, in the mouth, or in the face. Severe pain in and around the infected tooth. Severe symptoms of this condition include: Difficulty swallowing. Difficulty opening the mouth. Nausea. Vomiting. How is this diagnosed? This condition is diagnosed based on: Your symptoms and your medical and dental history. An examination of the infected tooth. During the exam, your dental care provider may tap on the infected tooth. You may also need to have X-rays taken of the affected area. How is this treated? This condition is treated by getting rid of the infection. This may be done with: Antibiotic medicines. These may be used in certain  situations. Antibacterial mouth rinse. Incision and drainage. This procedure is done by making an incision in the abscess to drain out the pus. Removing pus is the first priority in treating an abscess. A root canal. This may be performed to save the tooth. Your dental care provider accesses the visible part of your tooth (crown) with a drill and removes any infected pulp. Then the space is filled and sealed off. Tooth extraction. The tooth is pulled out if it cannot be saved by other treatment. You may also receive treatment for pain, such as: Acetaminophen or NSAIDs. Gels that contain a numbing medicine. An injection to block the pain near your nerve. Follow these instructions at home: Medicines Take over-the-counter and prescription medicines only as told by your dental care provider. If you were prescribed an antibiotic, take it as told by your dental care provider. Do not stop taking the antibiotic even if you start to feel better. If you were prescribed a gel that contains a numbing medicine, use it exactly as told in the directions. Do not use these gels for children who are younger than 18 years of age. Use an antibacterial mouth rinse as told by your dental care provider. General instructions  Gargle with a mixture of salt and water 3-4 times a day or as needed. To make salt water, completely dissolve -1 tsp (3-6 g) of salt in 1 cup (237 mL) of warm water. Eat a soft diet while your abscess is healing. Drink enough fluid to keep your urine pale yellow. Do not apply heat to the outside of your mouth. Do not use any products that contain nicotine or tobacco. These  products include cigarettes, chewing tobacco, and vaping devices, such as e-cigarettes. If you need help quitting, ask your dental care provider. Keep all follow-up visits. This is important. How is this prevented?  Excellent dental home care, which includes brushing your teeth every morning and night with fluoride  toothpaste. Floss one time each day. Get regularly scheduled dental cleanings. Consider having a dental sealant applied on teeth that have deep grooves to prevent cavities. Drink fluoridated water regularly. This includes most tap water. Check the label on bottled water to see if it contains fluoride. Reduce or eliminate sugary drinks. Eat healthy meals and snacks. Wear a mouth guard or face shield to protect your teeth while playing sports. Contact a health care provider if: Your pain is worse and is not helped by medicine. You have swelling. You see pus around the tooth. You have a fever or chills. Get help right away if: Your symptoms suddenly get worse. You have a very bad headache. You have problems breathing or swallowing. You have trouble opening your mouth. You have swelling in your neck or around your eye. These symptoms may represent a serious problem that is an emergency. Do not wait to see if the symptoms will go away. Get medical help right away. Call your local emergency services (911 in the [place] Do not drive yourself to the hospital. Summary A dental abscess is a collection of pus in or around a tooth that results from an infection. A dental abscess may result from severe tooth decay, trauma to the tooth, or severe gum disease around a tooth. Symptoms include severe pain, swelling, redness, and drainage of pus in and around the infected tooth. The first priority in treating a dental abscess is to drain out the pus. Treatment may also involve removing damage inside the tooth (root canal) or extracting the tooth. This information is not intended to replace advice given to you by your health care provider. Make sure you discuss any questions you have with your health care provider. Document Revised: 06/03/2020 Document Reviewed: 06/03/2020 Elsevier Patient Education  [place].

## 2022-06-05 DIAGNOSIS — Z743 Need for continuous supervision: Secondary | ICD-10-CM | POA: Diagnosis not present

## 2022-06-05 DIAGNOSIS — W19XXXA Unspecified fall, initial encounter: Secondary | ICD-10-CM | POA: Diagnosis not present

## 2022-06-05 DIAGNOSIS — R079 Chest pain, unspecified: Secondary | ICD-10-CM | POA: Diagnosis not present

## 2022-06-09 DEATH — deceased

## 2022-06-20 ENCOUNTER — Ambulatory Visit (HOSPITAL_BASED_OUTPATIENT_CLINIC_OR_DEPARTMENT_OTHER): Payer: Medicare HMO | Admitting: Cardiology

## 2022-06-20 NOTE — Progress Notes (Incomplete)
Cardiology Office Note:    Date:  06/20/2022   ID:  Cynthia Cook, DOB 1955-04-22, MRN [id number]  PCP:  Lurline Del, DO  Cardiologist:  Buford Dresser, MD  Referring MD: Lurline Del, DO   CC: close follow up  History of Present Illness:    Cynthia Cook is a 67 y.o. female with a hx of cparoxysmal atrial fibrillation, PAD, pulmonary embolism, CVA, carotid stenosis, subdural hematoma, hypothyroidism, chronic back pain, HFrEF, PVD, gout, CKD 3B who presents for follow up  During her most recent visit with Loel Dubonnet, NP she had been feeling ok since her discharge from the hospital. She had associated palpitations with he atrial fibrillation. Her exertional dyspnea was stable but she ad difficulty having the strength to ambulate on her own.  Today, the patient states that she  She denies any palpitations, chest pain, shortness of breath, or peripheral edema. No lightheadedness, headaches, syncope, orthopnea, or PND.  (+)  Past Medical History:  Diagnosis Date   Back pain    Gout    Murmur    PE (pulmonary embolism)    Persistent atrial fibrillation (HCC)    Pulmonary emboli (HCC) 02/05/2020   Syncope    Thyroid disease     No past surgical history on file.  Current Medications: Current Outpatient Medications on File Prior to Visit  Medication Sig   apixaban (ELIQUIS) 5 MG TABS tablet Take 1 tablet (5 mg total) by mouth 2 (two) times daily.   Ascorbic Acid (VITAMIN C) 1000 MG tablet Take 4,000 mg by mouth daily.   b complex vitamins capsule Take 1 capsule by mouth daily.   benzonatate (TESSALON PERLES) 100 MG capsule Take 1 capsule (100 mg total) by mouth 3 (three) times daily as needed for cough (Take one tablet for cough, if one tablet does not work well can take a second tablet.).   bisoprolol (ZEBETA) 5 MG tablet Take 1 tablet (5 mg total) by mouth daily.   furosemide (LASIX) 40 MG tablet Take 1 tablet (40 mg total) by mouth daily.   levothyroxine  (SYNTHROID) 50 MCG tablet Take 1 tablet (50 mcg) by mouth on Monday, Wednesday, and Friday (Patient taking differently: Take 50 mcg by mouth See admin instructions. 50 mcg once daily on Monday, Wednesday, Friday.)   levothyroxine (SYNTHROID) 75 MCG tablet Take 1 tablet (75 mcg) by mouth on Tuesday, Thursday, Saturday, and Sunday (Patient taking differently: Take 75 mcg by mouth See admin instructions. 75 mcg once daily on Sunday, Tuesday, Thursday, Saturday.)   OVER THE COUNTER MEDICATION Take 1 tablet by mouth daily with supper. Calcium + D3 + K2   senna (SENOKOT) 8.6 MG tablet Take 1 tablet by mouth daily.   vitamin E 180 MG (400 UNITS) capsule Take 400 Units by mouth daily.   No current facility-administered medications on file prior to visit.     Allergies:   Codeine, Duragesic-100 [fentanyl], Mucinex [guaifenesin er], and Nsaids   Social History   Tobacco Use   Smoking status: Never   Smokeless tobacco: Never  Vaping Use   Vaping Use: Never used  Substance Use Topics   Alcohol use: No   Drug use: Never    Family History: Family history is unknown by patient.  ROS:   Please see the history of present illness.    Additional pertinent ROS otherwise unremarkable.   EKGs/Labs/Other Studies Reviewed:    The following studies were reviewed today: Echo 02/06/20 1. Left ventricular ejection fraction, by estimation,  is 55 to 60%. The  left ventricle has normal function. The left ventricle has no regional  wall motion abnormalities. There is mild concentric left ventricular  hypertrophy. Left ventricular diastolic  function could not be evaluated.   2. Moderate calcific mitral stenosis is present. MG 5.6 mmHG @ 109 bpm.  MVA by VTI 1.86 cm2. Mild to moderate MR is present which is related to  restricted movement of the PMVL (IIIB). The mitral valve is degenerative.  Mild to moderate mitral valve  regurgitation. Moderate mitral stenosis. Severe mitral annular  calcification.    3. Mild to moderate AS is present. V max 2.25 m/s, MG 12.3 mmHG, AVA 1.29  cm2, DI 0.34. Gradients lower than expected due to low SVI (SV=56 cc,  SVI=26 cc/m2). The aortic valve is tricuspid. There is moderate  calcification of the aortic valve. There is  moderate thickening of the aortic valve. Aortic valve regurgitation is not  visualized. Mild to moderate aortic valve stenosis. Aortic valve area, by  VTI measures 1.29 cm. Aortic valve mean gradient measures 12.3 mmHg.  Aortic valve Vmax measures 2.25  m/s.   4. Right ventricular systolic function is normal. The right ventricular  size is mildly enlarged. There is mildly elevated pulmonary artery  systolic pressure. The estimated right ventricular systolic pressure is  0000000 mmHg.   5. Left atrial size was severely dilated.   6. The inferior vena cava is dilated in size with <50% respiratory  variability, suggesting right atrial pressure of 15 mmHg.   Comparison(s): Changes from prior study are noted. Afib with RVR is now  present. Valvular heart disease is stable.   CT PE 02/27/20 FINDINGS: Cardiovascular: There is adequate opacification of the pulmonary arterial tree. There is no intraluminal filling defect identified to suggest acute pulmonary embolism. The central pulmonary arteries are enlarged in keeping with pulmonary arterial hypertension. There is extensive coronary artery calcification. There is extensive calcification of the mitral valve annulus. Mild calcification of the aortic valve leaflets. There is mild global cardiomegaly. No pericardial effusion. Atherosclerotic calcification is seen within the thoracic aorta. No aortic aneurysm.   Mediastinum/Nodes: No pathologic thoracic adenopathy. Esophagus is unremarkable.   Lungs/Pleura: Trace right and small left pleural effusions are present. Mild bibasilar atelectasis. No superimposed confluent pulmonary infiltrate. No pneumothorax. Central airways are  widely patent.   Upper Abdomen: Unremarkable   Musculoskeletal: No acute bone abnormality.   Review of the MIP images confirms the above findings.   IMPRESSION: No pulmonary embolism.   Morphologic changes of pulmonary arterial hypertension.   Mild global cardiomegaly.  Extensive coronary artery calcification.   Extensive calcification of the mitral valve annulus noted. Echocardiography may be helpful to assess the degree of valvular dysfunction.   Trace right and small left pleural effusions, slightly increased since prior examination.  EKG:  EKG is personally reviewed.   06/20/22: ***  Recent Labs: 04/29/2022: TSH 4.812 05/11/2022: B Natriuretic Peptide 790.4 05/12/2022: ALT 27 05/16/2022: BUN 57; Creatinine, Ser 1.33; Hemoglobin 11.3; Magnesium 2.2; Platelets 180; Potassium 3.9; Sodium 140  Recent Lipid Panel    Component Value Date/Time   CHOL 187 12/31/2021 0615   TRIG 73 12/31/2021 0615   HDL 45 12/31/2021 0615   CHOLHDL 4.2 12/31/2021 0615   VLDL 15 12/31/2021 0615   LDLCALC 127 (H) 12/31/2021 0615    Physical Exam:    VS:  There were no vitals taken for this visit.    Wt Readings from Last 3 Encounters:  05/18/22  161 lb 8 oz (73.3 kg)  05/16/22 158 lb 11.7 oz (72 kg)  05/05/22 177 lb 6.4 oz (80.5 kg)    GEN: Well nourished, well developed in no acute distress HEENT: Normal, moist mucous membranes NECK: No JVD CARDIAC: regular rhythm, normal S1 and S2, no rubs or gallops. No murmur. VASCULAR: Radial and DP pulses 2+ bilaterally. No carotid bruits RESPIRATORY:  Clear to auscultation without rales, wheezing or rhonchi  ABDOMEN: Soft, non-tender, non-distended MUSCULOSKELETAL:  Ambulates independently SKIN: Warm and dry, no edema NEUROLOGIC:  Alert and oriented x 3. No focal neuro deficits noted. PSYCHIATRIC:  Normal affect    ASSESSMENT:    No diagnosis found. PLAN:    Paroxysmal atrial fibrillation: CHA2DS2/VAS Stroke Risk Points= 2   -we discussed  stroke risk at length. She agrees to take apixaban for at least one month post cardioversion. I recommended long term anticoagulation given her chadsvasc and risk of stroke. She prefers natural alternatives, but we discussed that these have not been proven to prevent stroke.  ADDENDED to add: this is a my chart message from the patient dated 03/30/20: [name] to Me   BF   6:30 AM For over 5 decades of life I've been extremely cold tolerant. [place], known for brutal winters that last for months. It was routine for me to go out in the snow wearing a tee shirt and jeans, and be completely comfortable. Since initiating Eliquis I've been cold constantly, worse in the past several days. The heat in my apartment is at 47 and I remain inside wearing 2 sweaters, 2 pair of socks and covered by 2 blankets, a quilt and a heating pad. And I'm still cold. I've completed the post-30 day   conversion Eliquis dose and have decided to stop taking it. Dr. Harrell Gave explained the potential implications of the results of this decision carefully and completely at my last visit. I take fully informed responsibility for any and all results. I will be returning to the combined anticoagulant effects of garlic, oral vitamin E and nattokinase / streptokinase. And I will not resume fish oil, based on the scary AHA article [Fish Oil Supplements May Increase the Risk for Atrial Fibrillation: What Does This Mean? Michelle Samuel].  There are clearly still questions about this meta-analysis but I had just begun taking fish oil less than  3 weeks before I developed afib and am not going to risk it.    I hope everyone in your office has a happy, healthy holiday season. I look forward to seeing you in March,  hopefully 30 pounds lighter and with perfect labs! :)   Kelaiah Cacal    Hypercholesterolemia, coronary artery calcification: -see extensive prior discussion. Declines statins, willing to  try ezetimibe -recommended for cardiac CT given prior chest pain, declines -we do not recommend nor offer chelation therapy -last LDL 112, recommended goal <70   Cardiac risk counseling and prevention recommendations: -recommend heart healthy/Mediterranean diet, with whole grains, fruits, vegetable, fish, lean meats, nuts, and olive oil. Limit salt. -recommend moderate walking, 3-5 times/week for 30-50 minutes each session. Aim for at least 150 minutes.week. Goal should be pace of 3 miles/hours, or walking 1.5 miles in 30 minutes -recommend avoidance of tobacco products. Avoid excess alcohol. -ASCVD risk score: The ASCVD Risk score (Arnett DK, et al., 2019) failed to calculate for the following reasons:   The patient has a prior MI or stroke diagnosis    Plan  for follow up: ***  Buford Dresser, MD, PhD [place]  [facility]    Medication Adjustments/Labs and Tests Ordered: Current medicines are reviewed at length with the patient today.  Concerns regarding medicines are outlined above.  No orders of the defined types were placed in this encounter.  No orders of the defined types were placed in this encounter.    There are no Patient Instructions on file for this visit.   I,Coren O'Brien,acting as a Education administrator for [employer], MD.,have documented all relevant documentation on the behalf of Buford Dresser, MD,as directed by  Buford Dresser, MD while in the presence of Buford Dresser, MD.  ***

## 2022-08-14 ENCOUNTER — Other Ambulatory Visit: Payer: Self-pay | Admitting: *Deleted

## 2022-08-14 DIAGNOSIS — I872 Venous insufficiency (chronic) (peripheral): Secondary | ICD-10-CM

## 2022-08-21 ENCOUNTER — Ambulatory Visit (HOSPITAL_COMMUNITY): Payer: Self-pay | Attending: Surgery

## 2022-08-21 ENCOUNTER — Encounter: Payer: Medicare HMO | Admitting: Surgery

## 2022-08-21 ENCOUNTER — Ambulatory Visit (HOSPITAL_COMMUNITY): Payer: Self-pay

## 2022-08-21 NOTE — Progress Notes (Deleted)
[facility] [place]  Patient name: Cynthia Cook MRN: [id number] DOB: [birth year] Sex: female   REASON FOR VISIT:    Follow up  HISOTRY OF PRESENT ILLNESS:    Cynthia Cook is a 66 y.o. female who I saw as a inpatient consult in January 2024 with concerns for arterial ischemia.  She was admitted for decreased sensation in both of her legs.  She had fallen on December 14 and diagnosed with a compression fracture of her lumbar spine.  Since that time she had been having paresthesias in her feet and difficulty walking secondary to pain.  She was also diagnosed with RSV pneumonia on 03/30/2022 with progressive decline since that time.  Pedal pulses were not palpable and so vascular consultation was requested.  ABIs were 0.81 on the right and 0.77 on the left.  I felt that her symptoms were unrelated to her vascular disease and more to her compression fracture.  Therefore no intervention was performed.  She is back today for follow-up.  Patient suffers from atrial fibrillation.  She is a non-smoker.  She has a history of PE as well as stroke and subdural hematoma.   PAST MEDICAL HISTORY:   Past Medical History:  Diagnosis Date   Back pain    Gout    Murmur    PE (pulmonary embolism)    Persistent atrial fibrillation (HCC)    Pulmonary emboli (HCC) 02/05/2020   Syncope    Thyroid disease      FAMILY HISTORY:   Family History  Family history unknown: Yes    SOCIAL HISTORY:   Social History   Tobacco Use   Smoking status: Never   Smokeless tobacco: Never  Substance Use Topics   Alcohol use: No     ALLERGIES:   Allergies  Allergen Reactions   Codeine Nausea Only   Duragesic-100 [Fentanyl] Nausea And Vomiting    Dizziness Lightheadedness    Mucinex [Guaifenesin Er] Other (See Comments)    Syncope    Nsaids Other (See Comments)    Kidney disease     CURRENT MEDICATIONS:   Current Outpatient Medications   Medication Sig Dispense Refill   apixaban (ELIQUIS) 5 MG TABS tablet Take 1 tablet (5 mg total) by mouth 2 (two) times daily. 60 tablet 5   Ascorbic Acid (VITAMIN C) 1000 MG tablet Take 4,000 mg by mouth daily.     b complex vitamins capsule Take 1 capsule by mouth daily.     benzonatate (TESSALON PERLES) 100 MG capsule Take 1 capsule (100 mg total) by mouth 3 (three) times daily as needed for cough (Take one tablet for cough, if one tablet does not work well can take a second tablet.). 30 capsule 1   bisoprolol (ZEBETA) 5 MG tablet Take 1 tablet (5 mg total) by mouth daily. 30 tablet 5   furosemide (LASIX) 40 MG tablet Take 1 tablet (40 mg total) by mouth daily. 30 tablet 2   levothyroxine (SYNTHROID) 50 MCG tablet Take 1 tablet (50 mcg) by mouth on Monday, Wednesday, and Friday (Patient taking differently: Take 50 mcg by mouth See admin instructions. 50 mcg once daily on Monday, Wednesday, Friday.) 45 tablet 11   levothyroxine (SYNTHROID) 75 MCG tablet Take 1 tablet (75 mcg) by mouth on Tuesday, Thursday, Saturday, and Sunday (Patient taking differently: Take 75 mcg by mouth See admin instructions. 75 mcg once daily on Sunday, Tuesday, Thursday, Saturday.) 45 tablet 11   OVER THE COUNTER MEDICATION Take 1  tablet by mouth daily with supper. Calcium + D3 + K2     senna (SENOKOT) 8.6 MG tablet Take 1 tablet by mouth daily.     vitamin E 180 MG (400 UNITS) capsule Take 400 Units by mouth daily.     No current facility-administered medications for this visit.    REVIEW OF SYSTEMS:   [X]  denotes positive finding, [ ]  denotes negative finding Cardiac  Comments:  Chest pain or chest pressure: ***   Shortness of breath upon exertion:    Short of breath when lying flat:    Irregular heart rhythm:        Vascular    Pain in calf, thigh, or hip brought on by ambulation:    Pain in feet at night that wakes you up from your sleep:     Blood clot in your veins:    Leg swelling:         Pulmonary     Oxygen at home:    Productive cough:     Wheezing:         Neurologic    Sudden weakness in arms or legs:     Sudden numbness in arms or legs:     Sudden onset of difficulty speaking or slurred speech:    Temporary loss of vision in one eye:     Problems with dizziness:         Gastrointestinal    Blood in stool:     Vomited blood:         Genitourinary    Burning when urinating:     Blood in urine:        Psychiatric    Major depression:         Hematologic    Bleeding problems:    Problems with blood clotting too easily:        Skin    Rashes or ulcers:        Constitutional    Fever or chills:      PHYSICAL EXAM:   There were no vitals filed for this visit.  GENERAL: The patient is a well-nourished female, in no acute distress. The vital signs are documented above. CARDIAC: There is a regular rate and rhythm.  VASCULAR: *** PULMONARY: Non-labored respirations ABDOMEN: Soft and non-tender with normal pitched bowel sounds.  MUSCULOSKELETAL: There are no major deformities or cyanosis. NEUROLOGIC: No focal weakness or paresthesias are detected. SKIN: There are no ulcers or rashes noted. PSYCHIATRIC: The patient has a normal affect.  STUDIES:   ***  MEDICAL ISSUES:   ***    Charlena Cross, MD, FACS [facility] [phone] Pager [phone]
